# Patient Record
Sex: Female | Born: 1940 | Race: White | Hispanic: No | Smoking: Current some day smoker
Health system: Southern US, Community
[De-identification: ages and names within clinical notes are randomized; demographics above are authoritative.]

## PROBLEM LIST (undated history)

## (undated) DIAGNOSIS — E039 Hypothyroidism, unspecified: Secondary | ICD-10-CM

## (undated) DIAGNOSIS — K219 Gastro-esophageal reflux disease without esophagitis: Secondary | ICD-10-CM

## (undated) DIAGNOSIS — K589 Irritable bowel syndrome without diarrhea: Secondary | ICD-10-CM

## (undated) DIAGNOSIS — I428 Other cardiomyopathies: Secondary | ICD-10-CM

## (undated) DIAGNOSIS — Z95810 Presence of automatic (implantable) cardiac defibrillator: Secondary | ICD-10-CM

## (undated) DIAGNOSIS — I509 Heart failure, unspecified: Secondary | ICD-10-CM

## (undated) DIAGNOSIS — I447 Left bundle-branch block, unspecified: Secondary | ICD-10-CM

## (undated) DIAGNOSIS — E119 Type 2 diabetes mellitus without complications: Secondary | ICD-10-CM

## (undated) DIAGNOSIS — IMO0002 Reserved for concepts with insufficient information to code with codable children: Secondary | ICD-10-CM

## (undated) DIAGNOSIS — G8929 Other chronic pain: Secondary | ICD-10-CM

## (undated) DIAGNOSIS — M1990 Unspecified osteoarthritis, unspecified site: Secondary | ICD-10-CM

## (undated) DIAGNOSIS — Z860100 Personal history of colon polyps, unspecified: Secondary | ICD-10-CM

## (undated) DIAGNOSIS — F329 Major depressive disorder, single episode, unspecified: Secondary | ICD-10-CM

## (undated) DIAGNOSIS — E785 Hyperlipidemia, unspecified: Secondary | ICD-10-CM

## (undated) DIAGNOSIS — I519 Heart disease, unspecified: Secondary | ICD-10-CM

## (undated) DIAGNOSIS — F419 Anxiety disorder, unspecified: Secondary | ICD-10-CM

## (undated) DIAGNOSIS — M545 Low back pain: Secondary | ICD-10-CM

## (undated) DIAGNOSIS — N390 Urinary tract infection, site not specified: Secondary | ICD-10-CM

## (undated) DIAGNOSIS — Z9889 Other specified postprocedural states: Secondary | ICD-10-CM

## (undated) DIAGNOSIS — M549 Dorsalgia, unspecified: Secondary | ICD-10-CM

## (undated) DIAGNOSIS — E032 Hypothyroidism due to medicaments and other exogenous substances: Secondary | ICD-10-CM

## (undated) DIAGNOSIS — G4700 Insomnia, unspecified: Secondary | ICD-10-CM

## (undated) DIAGNOSIS — E041 Nontoxic single thyroid nodule: Secondary | ICD-10-CM

## (undated) DIAGNOSIS — Z87442 Personal history of urinary calculi: Secondary | ICD-10-CM

## (undated) DIAGNOSIS — F17200 Nicotine dependence, unspecified, uncomplicated: Secondary | ICD-10-CM

## (undated) DIAGNOSIS — C3491 Malignant neoplasm of unspecified part of right bronchus or lung: Secondary | ICD-10-CM

## (undated) DIAGNOSIS — F32A Depression, unspecified: Secondary | ICD-10-CM

## (undated) DIAGNOSIS — N200 Calculus of kidney: Secondary | ICD-10-CM

## (undated) DIAGNOSIS — I10 Essential (primary) hypertension: Secondary | ICD-10-CM

## (undated) DIAGNOSIS — R112 Nausea with vomiting, unspecified: Secondary | ICD-10-CM

## (undated) HISTORY — DX: Left bundle-branch block, unspecified: I44.7

## (undated) HISTORY — DX: Dorsalgia, unspecified: M54.9

## (undated) HISTORY — DX: Essential (primary) hypertension: I10

## (undated) HISTORY — DX: Depression, unspecified: F32.A

## (undated) HISTORY — DX: Other cardiomyopathies: I42.8

## (undated) HISTORY — DX: Insomnia, unspecified: G47.00

## (undated) HISTORY — DX: Gastro-esophageal reflux disease without esophagitis: K21.9

## (undated) HISTORY — DX: Hypothyroidism due to medicaments and other exogenous substances: E03.2

## (undated) HISTORY — DX: Other chronic pain: G89.29

## (undated) HISTORY — DX: Personal history of colonic polyps: Z86.010

## (undated) HISTORY — DX: Reserved for concepts with insufficient information to code with codable children: IMO0002

## (undated) HISTORY — PX: OTHER SURGICAL HISTORY: SHX169

## (undated) HISTORY — DX: Heart disease, unspecified: I51.9

## (undated) HISTORY — DX: Nontoxic single thyroid nodule: E04.1

## (undated) HISTORY — DX: Heart failure, unspecified: I50.9

## (undated) HISTORY — DX: Hyperlipidemia, unspecified: E78.5

## (undated) HISTORY — DX: Urinary tract infection, site not specified: N39.0

## (undated) HISTORY — DX: Irritable bowel syndrome without diarrhea: K58.9

## (undated) HISTORY — DX: Anxiety disorder, unspecified: F41.9

## (undated) HISTORY — DX: Calculus of kidney: N20.0

## (undated) HISTORY — DX: Personal history of colon polyps, unspecified: Z86.0100

## (undated) HISTORY — DX: Low back pain: M54.5

## (undated) HISTORY — PX: BLADDER SURGERY: SHX569

## (undated) HISTORY — DX: Irritable bowel syndrome, unspecified: K58.9

## (undated) HISTORY — PX: FOOT SURGERY: SHX648

## (undated) HISTORY — DX: Personal history of urinary calculi: Z87.442

## (undated) HISTORY — PX: PACEMAKER PLACEMENT: SHX43

## (undated) HISTORY — PX: COLONOSCOPY: SHX174

## (undated) HISTORY — DX: Nicotine dependence, unspecified, uncomplicated: F17.200

## (undated) HISTORY — PX: THYROIDECTOMY, PARTIAL: SHX18

## (undated) HISTORY — DX: Type 2 diabetes mellitus without complications: E11.9

## (undated) HISTORY — PX: TUBAL LIGATION: SHX77

## (undated) HISTORY — DX: Major depressive disorder, single episode, unspecified: F32.9

## (undated) HISTORY — PX: OOPHORECTOMY: SHX86

## (undated) HISTORY — DX: Hypothyroidism, unspecified: E03.9

---
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
## 1971-03-10 HISTORY — PX: NEPHRECTOMY: SHX65

## 1984-03-09 HISTORY — PX: ABDOMINAL HYSTERECTOMY: SHX81

## 1997-03-09 ENCOUNTER — Encounter: Payer: Self-pay | Admitting: Internal Medicine

## 1997-12-03 ENCOUNTER — Other Ambulatory Visit: Admission: RE | Admit: 1997-12-03 | Discharge: 1997-12-03 | Payer: Self-pay | Admitting: Obstetrics & Gynecology

## 1999-12-08 ENCOUNTER — Encounter: Payer: Self-pay | Admitting: Emergency Medicine

## 1999-12-08 ENCOUNTER — Emergency Department (HOSPITAL_COMMUNITY): Admission: EM | Admit: 1999-12-08 | Discharge: 1999-12-08 | Payer: Self-pay | Admitting: Emergency Medicine

## 2000-07-20 ENCOUNTER — Encounter: Payer: Self-pay | Admitting: Emergency Medicine

## 2000-07-20 ENCOUNTER — Emergency Department (HOSPITAL_COMMUNITY): Admission: EM | Admit: 2000-07-20 | Discharge: 2000-07-21 | Payer: Self-pay | Admitting: Emergency Medicine

## 2000-11-01 ENCOUNTER — Emergency Department (HOSPITAL_COMMUNITY): Admission: EM | Admit: 2000-11-01 | Discharge: 2000-11-01 | Payer: Self-pay

## 2000-11-01 ENCOUNTER — Encounter: Payer: Self-pay | Admitting: Emergency Medicine

## 2001-01-01 ENCOUNTER — Ambulatory Visit (HOSPITAL_COMMUNITY): Admission: RE | Admit: 2001-01-01 | Discharge: 2001-01-01 | Payer: Self-pay | Admitting: *Deleted

## 2001-01-01 ENCOUNTER — Encounter: Payer: Self-pay | Admitting: *Deleted

## 2001-01-10 ENCOUNTER — Ambulatory Visit (HOSPITAL_COMMUNITY): Admission: RE | Admit: 2001-01-10 | Discharge: 2001-01-10 | Payer: Self-pay | Admitting: *Deleted

## 2001-01-10 ENCOUNTER — Encounter: Payer: Self-pay | Admitting: *Deleted

## 2001-07-05 ENCOUNTER — Ambulatory Visit (HOSPITAL_COMMUNITY): Admission: RE | Admit: 2001-07-05 | Discharge: 2001-07-05 | Payer: Self-pay | Admitting: *Deleted

## 2001-07-05 ENCOUNTER — Encounter: Payer: Self-pay | Admitting: *Deleted

## 2001-12-05 ENCOUNTER — Emergency Department (HOSPITAL_COMMUNITY): Admission: EM | Admit: 2001-12-05 | Discharge: 2001-12-05 | Payer: Self-pay | Admitting: Emergency Medicine

## 2001-12-15 ENCOUNTER — Encounter: Admission: RE | Admit: 2001-12-15 | Discharge: 2002-03-15 | Payer: Self-pay | Admitting: *Deleted

## 2002-05-02 ENCOUNTER — Encounter: Admission: RE | Admit: 2002-05-02 | Discharge: 2002-05-02 | Payer: Self-pay | Admitting: General Practice

## 2002-05-02 ENCOUNTER — Encounter: Payer: Self-pay | Admitting: General Practice

## 2002-05-31 ENCOUNTER — Encounter: Payer: Self-pay | Admitting: Emergency Medicine

## 2002-05-31 ENCOUNTER — Emergency Department (HOSPITAL_COMMUNITY): Admission: EM | Admit: 2002-05-31 | Discharge: 2002-05-31 | Payer: Self-pay | Admitting: *Deleted

## 2002-06-02 ENCOUNTER — Emergency Department (HOSPITAL_COMMUNITY): Admission: EM | Admit: 2002-06-02 | Discharge: 2002-06-02 | Payer: Self-pay

## 2002-06-25 ENCOUNTER — Emergency Department (HOSPITAL_COMMUNITY): Admission: EM | Admit: 2002-06-25 | Discharge: 2002-06-26 | Payer: Self-pay | Admitting: Emergency Medicine

## 2002-06-26 ENCOUNTER — Encounter: Payer: Self-pay | Admitting: Emergency Medicine

## 2002-06-26 ENCOUNTER — Ambulatory Visit (HOSPITAL_COMMUNITY): Admission: RE | Admit: 2002-06-26 | Discharge: 2002-06-26 | Payer: Self-pay | Admitting: Emergency Medicine

## 2002-06-26 ENCOUNTER — Emergency Department (HOSPITAL_COMMUNITY): Admission: EM | Admit: 2002-06-26 | Discharge: 2002-06-26 | Payer: Self-pay | Admitting: Emergency Medicine

## 2002-07-03 ENCOUNTER — Encounter: Payer: Self-pay | Admitting: Gastroenterology

## 2002-07-03 ENCOUNTER — Inpatient Hospital Stay (HOSPITAL_COMMUNITY): Admission: EM | Admit: 2002-07-03 | Discharge: 2002-07-06 | Payer: Self-pay | Admitting: Emergency Medicine

## 2002-07-04 ENCOUNTER — Encounter: Payer: Self-pay | Admitting: Gastroenterology

## 2002-07-05 ENCOUNTER — Encounter: Payer: Self-pay | Admitting: Gastroenterology

## 2003-03-08 ENCOUNTER — Emergency Department (HOSPITAL_COMMUNITY): Admission: AD | Admit: 2003-03-08 | Discharge: 2003-03-08 | Payer: Self-pay | Admitting: Family Medicine

## 2003-04-26 ENCOUNTER — Encounter
Admission: RE | Admit: 2003-04-26 | Discharge: 2003-07-25 | Payer: Self-pay | Admitting: Physical Medicine & Rehabilitation

## 2003-06-04 ENCOUNTER — Emergency Department (HOSPITAL_COMMUNITY): Admission: EM | Admit: 2003-06-04 | Discharge: 2003-06-04 | Payer: Self-pay | Admitting: Emergency Medicine

## 2003-08-30 ENCOUNTER — Encounter
Admission: RE | Admit: 2003-08-30 | Discharge: 2003-11-15 | Payer: Self-pay | Admitting: Physical Medicine & Rehabilitation

## 2003-11-15 ENCOUNTER — Encounter
Admission: RE | Admit: 2003-11-15 | Discharge: 2004-02-13 | Payer: Self-pay | Admitting: Physical Medicine & Rehabilitation

## 2003-11-19 ENCOUNTER — Ambulatory Visit: Payer: Self-pay | Admitting: Physical Medicine & Rehabilitation

## 2004-01-08 ENCOUNTER — Encounter: Admission: RE | Admit: 2004-01-08 | Discharge: 2004-01-08 | Payer: Self-pay | Admitting: Gastroenterology

## 2004-01-24 ENCOUNTER — Ambulatory Visit (HOSPITAL_COMMUNITY): Admission: RE | Admit: 2004-01-24 | Discharge: 2004-01-24 | Payer: Self-pay | Admitting: Gastroenterology

## 2004-02-22 ENCOUNTER — Encounter
Admission: RE | Admit: 2004-02-22 | Discharge: 2004-05-22 | Payer: Self-pay | Admitting: Physical Medicine & Rehabilitation

## 2004-03-11 ENCOUNTER — Ambulatory Visit (HOSPITAL_COMMUNITY): Admission: RE | Admit: 2004-03-11 | Discharge: 2004-03-11 | Payer: Self-pay | Admitting: Gastroenterology

## 2004-05-22 ENCOUNTER — Encounter
Admission: RE | Admit: 2004-05-22 | Discharge: 2004-08-20 | Payer: Self-pay | Admitting: Physical Medicine & Rehabilitation

## 2004-05-26 ENCOUNTER — Ambulatory Visit: Payer: Self-pay | Admitting: Physical Medicine & Rehabilitation

## 2004-07-21 ENCOUNTER — Ambulatory Visit: Payer: Self-pay | Admitting: Physical Medicine & Rehabilitation

## 2004-09-02 ENCOUNTER — Encounter
Admission: RE | Admit: 2004-09-02 | Discharge: 2004-12-01 | Payer: Self-pay | Admitting: Physical Medicine & Rehabilitation

## 2004-09-02 ENCOUNTER — Ambulatory Visit: Payer: Self-pay | Admitting: Physical Medicine & Rehabilitation

## 2004-09-08 ENCOUNTER — Ambulatory Visit (HOSPITAL_COMMUNITY)
Admission: RE | Admit: 2004-09-08 | Discharge: 2004-09-08 | Payer: Self-pay | Admitting: Physical Medicine & Rehabilitation

## 2004-11-15 ENCOUNTER — Encounter (INDEPENDENT_AMBULATORY_CARE_PROVIDER_SITE_OTHER): Payer: Self-pay | Admitting: Specialist

## 2004-11-15 ENCOUNTER — Ambulatory Visit (HOSPITAL_COMMUNITY): Admission: RE | Admit: 2004-11-15 | Discharge: 2004-11-15 | Payer: Self-pay | Admitting: Urology

## 2004-11-24 ENCOUNTER — Ambulatory Visit: Payer: Self-pay | Admitting: Gastroenterology

## 2004-12-03 ENCOUNTER — Ambulatory Visit: Payer: Self-pay | Admitting: Physical Medicine & Rehabilitation

## 2004-12-03 ENCOUNTER — Encounter
Admission: RE | Admit: 2004-12-03 | Discharge: 2005-03-03 | Payer: Self-pay | Admitting: Physical Medicine & Rehabilitation

## 2004-12-17 ENCOUNTER — Encounter: Admission: RE | Admit: 2004-12-17 | Discharge: 2004-12-17 | Payer: Self-pay | Admitting: *Deleted

## 2004-12-24 ENCOUNTER — Ambulatory Visit (HOSPITAL_COMMUNITY): Admission: RE | Admit: 2004-12-24 | Discharge: 2004-12-24 | Payer: Self-pay | Admitting: *Deleted

## 2004-12-24 ENCOUNTER — Encounter (INDEPENDENT_AMBULATORY_CARE_PROVIDER_SITE_OTHER): Payer: Self-pay | Admitting: *Deleted

## 2004-12-25 ENCOUNTER — Ambulatory Visit (HOSPITAL_COMMUNITY): Admission: RE | Admit: 2004-12-25 | Discharge: 2004-12-25 | Payer: Self-pay | Admitting: *Deleted

## 2005-02-20 ENCOUNTER — Ambulatory Visit: Payer: Self-pay | Admitting: Physical Medicine & Rehabilitation

## 2005-03-03 ENCOUNTER — Ambulatory Visit (HOSPITAL_COMMUNITY): Admission: RE | Admit: 2005-03-03 | Discharge: 2005-03-03 | Payer: Self-pay | Admitting: Urology

## 2005-05-21 ENCOUNTER — Ambulatory Visit: Payer: Self-pay | Admitting: Physical Medicine & Rehabilitation

## 2005-05-21 ENCOUNTER — Encounter
Admission: RE | Admit: 2005-05-21 | Discharge: 2005-08-19 | Payer: Self-pay | Admitting: Physical Medicine & Rehabilitation

## 2005-08-09 ENCOUNTER — Emergency Department (HOSPITAL_COMMUNITY): Admission: EM | Admit: 2005-08-09 | Discharge: 2005-08-09 | Payer: Self-pay | Admitting: Emergency Medicine

## 2005-08-10 ENCOUNTER — Ambulatory Visit (HOSPITAL_COMMUNITY): Admission: RE | Admit: 2005-08-10 | Discharge: 2005-08-10 | Payer: Self-pay | Admitting: *Deleted

## 2005-08-10 ENCOUNTER — Encounter (INDEPENDENT_AMBULATORY_CARE_PROVIDER_SITE_OTHER): Payer: Self-pay | Admitting: Specialist

## 2005-08-24 ENCOUNTER — Ambulatory Visit: Payer: Self-pay | Admitting: Physical Medicine & Rehabilitation

## 2005-08-24 ENCOUNTER — Encounter
Admission: RE | Admit: 2005-08-24 | Discharge: 2005-11-22 | Payer: Self-pay | Admitting: Physical Medicine & Rehabilitation

## 2005-09-25 ENCOUNTER — Ambulatory Visit: Payer: Self-pay | Admitting: Physical Medicine & Rehabilitation

## 2005-11-14 ENCOUNTER — Emergency Department (HOSPITAL_COMMUNITY): Admission: EM | Admit: 2005-11-14 | Discharge: 2005-11-14 | Payer: Self-pay | Admitting: Emergency Medicine

## 2005-11-18 ENCOUNTER — Ambulatory Visit: Payer: Self-pay | Admitting: Internal Medicine

## 2005-12-17 ENCOUNTER — Encounter
Admission: RE | Admit: 2005-12-17 | Discharge: 2006-03-17 | Payer: Self-pay | Admitting: Physical Medicine & Rehabilitation

## 2005-12-17 ENCOUNTER — Ambulatory Visit: Payer: Self-pay | Admitting: Physical Medicine & Rehabilitation

## 2005-12-22 ENCOUNTER — Ambulatory Visit: Payer: Self-pay | Admitting: Internal Medicine

## 2005-12-22 LAB — CONVERTED CEMR LAB
Alkaline Phosphatase: 37 units/L — ABNORMAL LOW (ref 39–117)
BUN: 4 mg/dL — ABNORMAL LOW (ref 6–23)
Basophils Relative: 0.3 % (ref 0.0–1.0)
Bilirubin Urine: NEGATIVE
CO2: 27 meq/L (ref 19–32)
Calcium: 9.2 mg/dL (ref 8.4–10.5)
Chloride: 104 meq/L (ref 96–112)
Chol/HDL Ratio, serum: 5.9
Cholesterol: 282 mg/dL (ref 0–200)
HDL: 47.4 mg/dL (ref >39.0)
Hemoglobin, Urine: NEGATIVE
Hemoglobin: 14.5 g/dL (ref 12.0–15.0)
Ketones, ur: NEGATIVE mg/dL
Lymphocytes Relative: 44.8 % (ref 12.0–46.0)
MCV: 94.4 fL (ref 78.0–100.0)
Monocytes Absolute: 0.6 10*3/uL (ref 0.2–0.7)
Monocytes Relative: 6.8 % (ref 3.0–11.0)
Neutrophils Relative %: 47.3 % (ref 43.0–77.0)
Potassium: 4.1 meq/L (ref 3.5–5.1)
TSH: 1.23 microintl units/mL (ref 0.35–5.50)
Total Protein: 6.7 g/dL (ref 6.0–8.3)
Triglyceride fasting, serum: 203 mg/dL (ref 0–149)
Urobilinogen, UA: 0.2 (ref 0.0–1.0)
VLDL: 41 mg/dL — ABNORMAL HIGH (ref 0–40)
WBC: 9.3 10*3/uL (ref 4.5–10.5)
pH: 7 (ref 5.0–8.0)

## 2005-12-29 ENCOUNTER — Ambulatory Visit (HOSPITAL_COMMUNITY): Admission: RE | Admit: 2005-12-29 | Discharge: 2005-12-29 | Payer: Self-pay | Admitting: Internal Medicine

## 2006-01-11 ENCOUNTER — Encounter (INDEPENDENT_AMBULATORY_CARE_PROVIDER_SITE_OTHER): Payer: Self-pay | Admitting: Specialist

## 2006-01-11 ENCOUNTER — Other Ambulatory Visit: Admission: RE | Admit: 2006-01-11 | Discharge: 2006-01-11 | Payer: Self-pay | Admitting: Interventional Radiology

## 2006-01-11 ENCOUNTER — Encounter: Admission: RE | Admit: 2006-01-11 | Discharge: 2006-01-11 | Payer: Self-pay | Admitting: Internal Medicine

## 2006-01-26 ENCOUNTER — Ambulatory Visit: Payer: Self-pay | Admitting: Internal Medicine

## 2006-02-11 ENCOUNTER — Ambulatory Visit: Payer: Self-pay | Admitting: Endocrinology

## 2006-03-15 ENCOUNTER — Encounter: Admission: RE | Admit: 2006-03-15 | Discharge: 2006-03-15 | Payer: Self-pay | Admitting: Orthopedic Surgery

## 2006-03-22 ENCOUNTER — Encounter
Admission: RE | Admit: 2006-03-22 | Discharge: 2006-06-20 | Payer: Self-pay | Admitting: Physical Medicine & Rehabilitation

## 2006-03-22 ENCOUNTER — Ambulatory Visit: Payer: Self-pay | Admitting: Physical Medicine & Rehabilitation

## 2006-04-26 ENCOUNTER — Encounter: Admission: RE | Admit: 2006-04-26 | Discharge: 2006-04-26 | Payer: Self-pay | Admitting: Orthopedic Surgery

## 2006-05-10 ENCOUNTER — Encounter: Admission: RE | Admit: 2006-05-10 | Discharge: 2006-05-10 | Payer: Self-pay | Admitting: Orthopedic Surgery

## 2006-06-16 ENCOUNTER — Encounter
Admission: RE | Admit: 2006-06-16 | Discharge: 2006-09-14 | Payer: Self-pay | Admitting: Physical Medicine & Rehabilitation

## 2006-06-16 ENCOUNTER — Ambulatory Visit: Payer: Self-pay | Admitting: Physical Medicine & Rehabilitation

## 2006-08-04 ENCOUNTER — Ambulatory Visit: Payer: Self-pay | Admitting: Internal Medicine

## 2006-08-23 ENCOUNTER — Encounter: Admission: RE | Admit: 2006-08-23 | Discharge: 2006-08-23 | Payer: Self-pay | Admitting: General Surgery

## 2006-08-31 ENCOUNTER — Encounter (INDEPENDENT_AMBULATORY_CARE_PROVIDER_SITE_OTHER): Payer: Self-pay | Admitting: General Surgery

## 2006-08-31 ENCOUNTER — Ambulatory Visit (HOSPITAL_COMMUNITY): Admission: RE | Admit: 2006-08-31 | Discharge: 2006-09-01 | Payer: Self-pay | Admitting: General Surgery

## 2006-10-06 ENCOUNTER — Ambulatory Visit: Payer: Self-pay | Admitting: Physical Medicine & Rehabilitation

## 2006-10-06 ENCOUNTER — Encounter
Admission: RE | Admit: 2006-10-06 | Discharge: 2006-12-07 | Payer: Self-pay | Admitting: Physical Medicine & Rehabilitation

## 2006-10-12 ENCOUNTER — Ambulatory Visit: Payer: Self-pay | Admitting: Internal Medicine

## 2006-10-12 LAB — CONVERTED CEMR LAB
ALT: 9 units/L (ref 0–35)
Cholesterol: 272 mg/dL (ref 0–200)
GFR calc Af Amer: 160 mL/min
GFR calc non Af Amer: 132 mL/min
HDL: 57.6 mg/dL (ref >39.0)
Potassium: 4.3 meq/L (ref 3.5–5.1)
Sodium: 138 meq/L (ref 135–145)
Total CHOL/HDL Ratio: 4.7
Triglycerides: 138 mg/dL (ref 0–149)
VLDL: 28 mg/dL (ref 0–40)
Vit D, 1,25-Dihydroxy: 8 — ABNORMAL LOW (ref 20–57)

## 2006-10-19 ENCOUNTER — Ambulatory Visit: Payer: Self-pay | Admitting: Internal Medicine

## 2006-10-24 ENCOUNTER — Encounter: Payer: Self-pay | Admitting: Internal Medicine

## 2006-10-24 DIAGNOSIS — Z860100 Personal history of colon polyps, unspecified: Secondary | ICD-10-CM | POA: Insufficient documentation

## 2006-10-24 DIAGNOSIS — I428 Other cardiomyopathies: Secondary | ICD-10-CM

## 2006-10-24 DIAGNOSIS — N390 Urinary tract infection, site not specified: Secondary | ICD-10-CM

## 2006-10-24 DIAGNOSIS — E785 Hyperlipidemia, unspecified: Secondary | ICD-10-CM | POA: Insufficient documentation

## 2006-10-24 DIAGNOSIS — E041 Nontoxic single thyroid nodule: Secondary | ICD-10-CM

## 2006-10-24 DIAGNOSIS — K589 Irritable bowel syndrome without diarrhea: Secondary | ICD-10-CM

## 2006-10-24 DIAGNOSIS — F411 Generalized anxiety disorder: Secondary | ICD-10-CM | POA: Insufficient documentation

## 2006-10-24 HISTORY — DX: Personal history of colonic polyps: Z86.010

## 2006-10-24 HISTORY — DX: Hyperlipidemia, unspecified: E78.5

## 2006-10-24 HISTORY — DX: Other cardiomyopathies: I42.8

## 2006-10-24 HISTORY — DX: Personal history of colon polyps, unspecified: Z86.0100

## 2006-10-24 HISTORY — DX: Urinary tract infection, site not specified: N39.0

## 2006-10-24 HISTORY — DX: Irritable bowel syndrome, unspecified: K58.9

## 2006-12-13 ENCOUNTER — Ambulatory Visit: Payer: Self-pay | Admitting: Internal Medicine

## 2006-12-13 LAB — CONVERTED CEMR LAB
Albumin: 3.4 g/dL — ABNORMAL LOW (ref 3.5–5.2)
Alkaline Phosphatase: 34 units/L — ABNORMAL LOW (ref 39–117)
BUN: 12 mg/dL (ref 6–23)
Basophils Absolute: 0.1 10*3/uL (ref 0.0–0.1)
Bilirubin Urine: NEGATIVE
Creatinine, Ser: 0.5 mg/dL (ref 0.4–1.2)
GFR calc Af Amer: 159 mL/min
GFR calc non Af Amer: 132 mL/min
HDL: 63.1 mg/dL (ref >39.0)
Hemoglobin, Urine: NEGATIVE
Hemoglobin: 12.9 g/dL (ref 12.0–15.0)
LDL Cholesterol: 101 mg/dL — ABNORMAL HIGH (ref 0–99)
Leukocytes, UA: NEGATIVE
MCHC: 34.9 g/dL (ref 30.0–36.0)
Monocytes Absolute: 0.8 10*3/uL — ABNORMAL HIGH (ref 0.2–0.7)
Monocytes Relative: 7.1 % (ref 3.0–11.0)
Neutro Abs: 5.8 10*3/uL (ref 1.4–7.7)
Potassium: 3.9 meq/L (ref 3.5–5.1)
RDW: 12.5 % (ref 11.5–14.6)
Total CHOL/HDL Ratio: 2.9
Triglycerides: 93 mg/dL (ref 0–149)
VLDL: 19 mg/dL (ref 0–40)
pH: 6 (ref 5.0–8.0)

## 2006-12-20 ENCOUNTER — Ambulatory Visit: Payer: Self-pay | Admitting: Internal Medicine

## 2006-12-20 ENCOUNTER — Encounter: Payer: Self-pay | Admitting: Internal Medicine

## 2006-12-20 DIAGNOSIS — F329 Major depressive disorder, single episode, unspecified: Secondary | ICD-10-CM

## 2006-12-20 DIAGNOSIS — M545 Low back pain, unspecified: Secondary | ICD-10-CM

## 2006-12-20 DIAGNOSIS — K219 Gastro-esophageal reflux disease without esophagitis: Secondary | ICD-10-CM

## 2006-12-20 DIAGNOSIS — F3289 Other specified depressive episodes: Secondary | ICD-10-CM

## 2006-12-20 DIAGNOSIS — Z87442 Personal history of urinary calculi: Secondary | ICD-10-CM

## 2006-12-20 HISTORY — DX: Gastro-esophageal reflux disease without esophagitis: K21.9

## 2006-12-20 HISTORY — DX: Low back pain, unspecified: M54.50

## 2006-12-20 HISTORY — DX: Other specified depressive episodes: F32.89

## 2006-12-20 HISTORY — DX: Personal history of urinary calculi: Z87.442

## 2006-12-20 HISTORY — DX: Major depressive disorder, single episode, unspecified: F32.9

## 2006-12-20 LAB — CONVERTED CEMR LAB
Leukocytes, UA: NEGATIVE
Nitrite: NEGATIVE
Specific Gravity, Urine: 1.01 (ref 1.000–1.03)
Total Protein, Urine: NEGATIVE mg/dL
Urobilinogen, UA: 0.2 (ref 0.0–1.0)

## 2007-01-10 ENCOUNTER — Encounter: Payer: Self-pay | Admitting: Internal Medicine

## 2007-01-10 ENCOUNTER — Ambulatory Visit: Payer: Self-pay | Admitting: Family Medicine

## 2007-01-11 ENCOUNTER — Telehealth (INDEPENDENT_AMBULATORY_CARE_PROVIDER_SITE_OTHER): Payer: Self-pay | Admitting: *Deleted

## 2007-01-14 ENCOUNTER — Encounter
Admission: RE | Admit: 2007-01-14 | Discharge: 2007-01-17 | Payer: Self-pay | Admitting: Physical Medicine & Rehabilitation

## 2007-01-14 ENCOUNTER — Ambulatory Visit: Payer: Self-pay | Admitting: Physical Medicine & Rehabilitation

## 2007-01-18 ENCOUNTER — Telehealth (INDEPENDENT_AMBULATORY_CARE_PROVIDER_SITE_OTHER): Payer: Self-pay | Admitting: *Deleted

## 2007-04-12 ENCOUNTER — Emergency Department (HOSPITAL_COMMUNITY): Admission: EM | Admit: 2007-04-12 | Discharge: 2007-04-12 | Payer: Self-pay | Admitting: Emergency Medicine

## 2007-05-13 ENCOUNTER — Encounter
Admission: RE | Admit: 2007-05-13 | Discharge: 2007-05-16 | Payer: Self-pay | Admitting: Physical Medicine & Rehabilitation

## 2007-05-13 ENCOUNTER — Ambulatory Visit: Payer: Self-pay | Admitting: Physical Medicine & Rehabilitation

## 2007-06-13 ENCOUNTER — Ambulatory Visit: Payer: Self-pay | Admitting: Internal Medicine

## 2007-06-13 DIAGNOSIS — F17200 Nicotine dependence, unspecified, uncomplicated: Secondary | ICD-10-CM

## 2007-06-15 ENCOUNTER — Encounter: Payer: Self-pay | Admitting: Internal Medicine

## 2007-07-12 ENCOUNTER — Ambulatory Visit: Payer: Self-pay | Admitting: Internal Medicine

## 2007-07-12 LAB — CONVERTED CEMR LAB
ALT: 18 units/L (ref 0–35)
AST: 16 units/L (ref 0–37)
Bilirubin, Direct: 0.1 mg/dL (ref 0.0–0.3)
Cholesterol: 170 mg/dL (ref 0–200)
HDL: 47.8 mg/dL (ref >39.0)
Total Protein: 6.2 g/dL (ref 6.0–8.3)
VLDL: 34 mg/dL (ref 0–40)

## 2007-08-12 ENCOUNTER — Encounter
Admission: RE | Admit: 2007-08-12 | Discharge: 2007-08-15 | Payer: Self-pay | Admitting: Physical Medicine & Rehabilitation

## 2007-08-15 ENCOUNTER — Ambulatory Visit: Payer: Self-pay | Admitting: Physical Medicine & Rehabilitation

## 2007-08-19 ENCOUNTER — Telehealth (INDEPENDENT_AMBULATORY_CARE_PROVIDER_SITE_OTHER): Payer: Self-pay | Admitting: *Deleted

## 2007-08-22 ENCOUNTER — Encounter: Payer: Self-pay | Admitting: Internal Medicine

## 2007-08-30 ENCOUNTER — Ambulatory Visit: Payer: Self-pay | Admitting: Internal Medicine

## 2007-08-30 DIAGNOSIS — R5381 Other malaise: Secondary | ICD-10-CM

## 2007-08-30 DIAGNOSIS — L989 Disorder of the skin and subcutaneous tissue, unspecified: Secondary | ICD-10-CM | POA: Insufficient documentation

## 2007-08-30 DIAGNOSIS — R5383 Other fatigue: Secondary | ICD-10-CM

## 2007-09-05 ENCOUNTER — Telehealth (INDEPENDENT_AMBULATORY_CARE_PROVIDER_SITE_OTHER): Payer: Self-pay | Admitting: *Deleted

## 2007-09-12 ENCOUNTER — Encounter (HOSPITAL_BASED_OUTPATIENT_CLINIC_OR_DEPARTMENT_OTHER): Admission: RE | Admit: 2007-09-12 | Discharge: 2007-09-22 | Payer: Self-pay | Admitting: Internal Medicine

## 2007-09-13 ENCOUNTER — Ambulatory Visit: Payer: Self-pay | Admitting: Pulmonary Disease

## 2007-09-13 LAB — CONVERTED CEMR LAB
ALT: 12 units/L (ref 0–35)
AST: 12 units/L (ref 0–37)
Alkaline Phosphatase: 41 units/L (ref 39–117)
Basophils Absolute: 0.1 10*3/uL (ref 0.0–0.1)
Basophils Relative: 0.8 % (ref 0.0–1.0)
Bilirubin, Direct: 0.1 mg/dL (ref 0.0–0.3)
CO2: 26 meq/L (ref 19–32)
Chloride: 101 meq/L (ref 96–112)
Creatinine, Ser: 0.5 mg/dL (ref 0.4–1.2)
GFR calc non Af Amer: 132 mL/min
Lymphocytes Relative: 28.5 % (ref 12.0–46.0)
MCHC: 34.7 g/dL (ref 30.0–36.0)
Neutrophils Relative %: 64.4 % (ref 43.0–77.0)
Platelets: 295 10*3/uL (ref 150–400)
Potassium: 3.5 meq/L (ref 3.5–5.1)
RBC: 4.17 M/uL (ref 3.87–5.11)
TSH: 0.58 microintl units/mL (ref 0.35–5.50)
Total Bilirubin: 0.6 mg/dL (ref 0.3–1.2)
WBC: 12.7 10*3/uL — ABNORMAL HIGH (ref 4.5–10.5)

## 2007-09-19 ENCOUNTER — Telehealth (INDEPENDENT_AMBULATORY_CARE_PROVIDER_SITE_OTHER): Payer: Self-pay | Admitting: *Deleted

## 2007-10-03 ENCOUNTER — Encounter: Payer: Self-pay | Admitting: Pulmonary Disease

## 2007-10-03 ENCOUNTER — Ambulatory Visit (HOSPITAL_BASED_OUTPATIENT_CLINIC_OR_DEPARTMENT_OTHER): Admission: RE | Admit: 2007-10-03 | Discharge: 2007-10-03 | Payer: Self-pay | Admitting: Pulmonary Disease

## 2007-10-18 ENCOUNTER — Encounter: Payer: Self-pay | Admitting: Internal Medicine

## 2007-10-18 ENCOUNTER — Ambulatory Visit: Payer: Self-pay | Admitting: Pulmonary Disease

## 2007-10-19 ENCOUNTER — Telehealth (INDEPENDENT_AMBULATORY_CARE_PROVIDER_SITE_OTHER): Payer: Self-pay | Admitting: *Deleted

## 2007-10-26 ENCOUNTER — Encounter (INDEPENDENT_AMBULATORY_CARE_PROVIDER_SITE_OTHER): Payer: Self-pay | Admitting: *Deleted

## 2007-11-07 ENCOUNTER — Encounter
Admission: RE | Admit: 2007-11-07 | Discharge: 2007-11-07 | Payer: Self-pay | Admitting: Physical Medicine & Rehabilitation

## 2007-11-07 ENCOUNTER — Ambulatory Visit: Payer: Self-pay | Admitting: Physical Medicine & Rehabilitation

## 2007-12-05 ENCOUNTER — Ambulatory Visit: Payer: Self-pay | Admitting: Pulmonary Disease

## 2007-12-06 ENCOUNTER — Encounter: Admission: RE | Admit: 2007-12-06 | Discharge: 2008-03-05 | Payer: Self-pay | Admitting: Family Medicine

## 2007-12-12 ENCOUNTER — Ambulatory Visit: Payer: Self-pay | Admitting: Internal Medicine

## 2007-12-12 DIAGNOSIS — I447 Left bundle-branch block, unspecified: Secondary | ICD-10-CM

## 2007-12-12 HISTORY — DX: Left bundle-branch block, unspecified: I44.7

## 2007-12-13 ENCOUNTER — Encounter: Payer: Self-pay | Admitting: Internal Medicine

## 2007-12-13 DIAGNOSIS — E119 Type 2 diabetes mellitus without complications: Secondary | ICD-10-CM | POA: Insufficient documentation

## 2007-12-13 DIAGNOSIS — E1165 Type 2 diabetes mellitus with hyperglycemia: Secondary | ICD-10-CM

## 2007-12-13 HISTORY — DX: Type 2 diabetes mellitus without complications: E11.9

## 2007-12-14 LAB — CONVERTED CEMR LAB
ALT: 15 U/L (ref 0–35)
AST: 18 U/L (ref 0–37)
Albumin: 3.5 g/dL (ref 3.5–5.2)
Alkaline Phosphatase: 38 U/L — ABNORMAL LOW (ref 39–117)
BUN: 5 mg/dL — ABNORMAL LOW (ref 6–23)
Basophils Absolute: 0.1 K/uL (ref 0.0–0.1)
Basophils Relative: 0.8 % (ref 0.0–3.0)
Bilirubin Urine: NEGATIVE
Bilirubin, Direct: 0.1 mg/dL (ref 0.0–0.3)
CO2: 28 meq/L (ref 19–32)
Calcium: 8.9 mg/dL (ref 8.4–10.5)
Chloride: 105 meq/L (ref 96–112)
Cholesterol: 165 mg/dL (ref 0–200)
Creatinine, Ser: 0.6 mg/dL (ref 0.4–1.2)
Eosinophils Absolute: 0.1 K/uL (ref 0.0–0.7)
Eosinophils Relative: 0.9 % (ref 0.0–5.0)
GFR calc Af Amer: 129 mL/min
GFR calc non Af Amer: 106 mL/min
Glucose, Bld: 120 mg/dL — ABNORMAL HIGH (ref 70–99)
HCT: 41.8 % (ref 36.0–46.0)
HDL: 54 mg/dL (ref >39.0)
Hemoglobin, Urine: NEGATIVE
Hemoglobin: 14.4 g/dL (ref 12.0–15.0)
Hgb A1c MFr Bld: 7.1 % — ABNORMAL HIGH (ref 4.6–6.0)
Ketones, ur: NEGATIVE mg/dL
LDL Cholesterol: 85 mg/dL (ref 0–99)
Leukocytes, UA: NEGATIVE
Lymphocytes Relative: 34.9 % (ref 12.0–46.0)
MCHC: 34.5 g/dL (ref 30.0–36.0)
MCV: 94.1 fL (ref 78.0–100.0)
Monocytes Absolute: 0.9 K/uL (ref 0.1–1.0)
Monocytes Relative: 7 % (ref 3.0–12.0)
Neutro Abs: 7.3 K/uL (ref 1.4–7.7)
Neutrophils Relative %: 56.4 % (ref 43.0–77.0)
Nitrite: NEGATIVE
Platelets: 289 K/uL (ref 150–400)
Potassium: 4.3 meq/L (ref 3.5–5.1)
RBC: 4.44 M/uL (ref 3.87–5.11)
RDW: 13.3 % (ref 11.5–14.6)
Sodium: 142 meq/L (ref 135–145)
Specific Gravity, Urine: <=1.005 (ref 1.000–1.03)
TSH: 0.31 u[IU]/mL — ABNORMAL LOW (ref 0.35–5.50)
Total Bilirubin: 0.7 mg/dL (ref 0.3–1.2)
Total CHOL/HDL Ratio: 3.1
Total Protein, Urine: NEGATIVE mg/dL
Total Protein: 6.4 g/dL (ref 6.0–8.3)
Triglycerides: 130 mg/dL (ref 0–149)
Urine Glucose: NEGATIVE mg/dL
Urobilinogen, UA: 0.2 (ref 0.0–1.0)
VLDL: 26 mg/dL (ref 0–40)
WBC: 12.9 10*3/microliter — ABNORMAL HIGH (ref 4.5–10.5)
pH: 6 (ref 5.0–8.0)

## 2007-12-19 ENCOUNTER — Encounter: Payer: Self-pay | Admitting: Internal Medicine

## 2007-12-19 ENCOUNTER — Ambulatory Visit: Payer: Self-pay

## 2007-12-20 ENCOUNTER — Telehealth: Payer: Self-pay | Admitting: Internal Medicine

## 2007-12-20 ENCOUNTER — Encounter: Payer: Self-pay | Admitting: Internal Medicine

## 2007-12-20 DIAGNOSIS — I519 Heart disease, unspecified: Secondary | ICD-10-CM

## 2007-12-20 DIAGNOSIS — R9431 Abnormal electrocardiogram [ECG] [EKG]: Secondary | ICD-10-CM

## 2007-12-20 DIAGNOSIS — R9389 Abnormal findings on diagnostic imaging of other specified body structures: Secondary | ICD-10-CM

## 2007-12-20 HISTORY — DX: Heart disease, unspecified: I51.9

## 2007-12-23 ENCOUNTER — Telehealth (INDEPENDENT_AMBULATORY_CARE_PROVIDER_SITE_OTHER): Payer: Self-pay | Admitting: *Deleted

## 2007-12-27 ENCOUNTER — Telehealth (INDEPENDENT_AMBULATORY_CARE_PROVIDER_SITE_OTHER): Payer: Self-pay | Admitting: *Deleted

## 2007-12-30 ENCOUNTER — Telehealth (INDEPENDENT_AMBULATORY_CARE_PROVIDER_SITE_OTHER): Payer: Self-pay | Admitting: *Deleted

## 2008-01-03 ENCOUNTER — Ambulatory Visit: Payer: Self-pay | Admitting: Cardiovascular Disease

## 2008-01-03 LAB — CONVERTED CEMR LAB
Basophils Absolute: 0.1 10*3/uL (ref 0.0–0.1)
Chloride: 106 meq/L (ref 96–112)
Eosinophils Absolute: 0.1 10*3/uL (ref 0.0–0.7)
GFR calc non Af Amer: 131 mL/min
MCHC: 33.9 g/dL (ref 30.0–36.0)
MCV: 95.9 fL (ref 78.0–100.0)
Neutrophils Relative %: 60.1 % (ref 43.0–77.0)
Platelets: 306 10*3/uL (ref 150–400)
Potassium: 4 meq/L (ref 3.5–5.1)
RDW: 13.1 % (ref 11.5–14.6)
Sodium: 143 meq/L (ref 135–145)
WBC: 12.4 10*3/uL — ABNORMAL HIGH (ref 4.5–10.5)

## 2008-01-04 ENCOUNTER — Telehealth: Payer: Self-pay | Admitting: Internal Medicine

## 2008-01-10 ENCOUNTER — Ambulatory Visit: Payer: Self-pay | Admitting: Cardiovascular Disease

## 2008-01-10 ENCOUNTER — Inpatient Hospital Stay (HOSPITAL_BASED_OUTPATIENT_CLINIC_OR_DEPARTMENT_OTHER): Admission: RE | Admit: 2008-01-10 | Discharge: 2008-01-10 | Payer: Self-pay | Admitting: Cardiovascular Disease

## 2008-01-10 HISTORY — PX: CARDIAC CATHETERIZATION: SHX172

## 2008-01-25 ENCOUNTER — Ambulatory Visit: Payer: Self-pay | Admitting: Cardiovascular Disease

## 2008-01-25 LAB — CONVERTED CEMR LAB
CO2: 29 meq/L (ref 19–32)
Chloride: 105 meq/L (ref 96–112)
GFR calc non Af Amer: 131 mL/min
Sodium: 142 meq/L (ref 135–145)

## 2008-01-27 ENCOUNTER — Encounter
Admission: RE | Admit: 2008-01-27 | Discharge: 2008-02-13 | Payer: Self-pay | Admitting: Physical Medicine & Rehabilitation

## 2008-01-30 ENCOUNTER — Ambulatory Visit: Payer: Self-pay | Admitting: Physical Medicine & Rehabilitation

## 2008-01-31 ENCOUNTER — Ambulatory Visit: Payer: Self-pay | Admitting: Cardiovascular Disease

## 2008-01-31 LAB — CONVERTED CEMR LAB
CO2: 28 meq/L (ref 19–32)
GFR calc Af Amer: 159 mL/min
Glucose, Bld: 97 mg/dL (ref 70–99)
Potassium: 4 meq/L (ref 3.5–5.1)
Sodium: 141 meq/L (ref 135–145)

## 2008-03-20 ENCOUNTER — Encounter: Payer: Self-pay | Admitting: Internal Medicine

## 2008-03-20 ENCOUNTER — Ambulatory Visit: Payer: Self-pay | Admitting: Internal Medicine

## 2008-03-20 DIAGNOSIS — H6690 Otitis media, unspecified, unspecified ear: Secondary | ICD-10-CM | POA: Insufficient documentation

## 2008-03-20 DIAGNOSIS — I428 Other cardiomyopathies: Secondary | ICD-10-CM

## 2008-03-20 LAB — CONVERTED CEMR LAB
Calcium: 9 mg/dL (ref 8.4–10.5)
Cholesterol: 131 mg/dL (ref 0–200)
Creatinine, Ser: 0.5 mg/dL (ref 0.4–1.2)
Direct LDL: 57.5 mg/dL
GFR calc Af Amer: 159 mL/min
Glucose, Bld: 117 mg/dL — ABNORMAL HIGH (ref 70–99)
HDL: 43.5 mg/dL (ref >39.0)
Sodium: 140 meq/L (ref 135–145)
Total CHOL/HDL Ratio: 3
Triglycerides: 205 mg/dL (ref 0–149)

## 2008-04-20 ENCOUNTER — Encounter
Admission: RE | Admit: 2008-04-20 | Discharge: 2008-05-22 | Payer: Self-pay | Admitting: Physical Medicine & Rehabilitation

## 2008-04-23 ENCOUNTER — Ambulatory Visit: Payer: Self-pay | Admitting: Physical Medicine & Rehabilitation

## 2008-05-22 ENCOUNTER — Ambulatory Visit: Payer: Self-pay | Admitting: Physical Medicine & Rehabilitation

## 2008-07-02 ENCOUNTER — Telehealth (INDEPENDENT_AMBULATORY_CARE_PROVIDER_SITE_OTHER): Payer: Self-pay | Admitting: *Deleted

## 2008-07-04 DIAGNOSIS — E032 Hypothyroidism due to medicaments and other exogenous substances: Secondary | ICD-10-CM

## 2008-07-04 HISTORY — DX: Hypothyroidism due to medicaments and other exogenous substances: E03.2

## 2008-07-23 ENCOUNTER — Ambulatory Visit: Payer: Self-pay | Admitting: Cardiovascular Disease

## 2008-07-25 ENCOUNTER — Telehealth: Payer: Self-pay | Admitting: Cardiovascular Disease

## 2008-07-25 LAB — CONVERTED CEMR LAB
BUN: 14 mg/dL (ref 6–23)
CO2: 29 meq/L (ref 19–32)
Calcium: 9 mg/dL (ref 8.4–10.5)
Chloride: 111 meq/L (ref 96–112)
Creatinine, Ser: 0.7 mg/dL (ref 0.4–1.2)
Glucose, Bld: 80 mg/dL (ref 70–99)

## 2008-07-30 ENCOUNTER — Telehealth (INDEPENDENT_AMBULATORY_CARE_PROVIDER_SITE_OTHER): Payer: Self-pay | Admitting: *Deleted

## 2008-08-13 ENCOUNTER — Encounter
Admission: RE | Admit: 2008-08-13 | Discharge: 2008-08-14 | Payer: Self-pay | Admitting: Physical Medicine & Rehabilitation

## 2008-08-14 ENCOUNTER — Ambulatory Visit: Payer: Self-pay | Admitting: Physical Medicine & Rehabilitation

## 2008-08-14 ENCOUNTER — Ambulatory Visit: Payer: Self-pay | Admitting: Internal Medicine

## 2008-08-14 LAB — CONVERTED CEMR LAB
BUN: 10 mg/dL (ref 6–23)
CO2: 28 meq/L (ref 19–32)
Calcium: 9.1 mg/dL (ref 8.4–10.5)
Creatinine, Ser: 0.5 mg/dL (ref 0.4–1.2)
GFR calc non Af Amer: 130.9 mL/min (ref >60)
Glucose, Bld: 120 mg/dL — ABNORMAL HIGH (ref 70–99)
Hgb A1c MFr Bld: 6.5 % (ref 4.6–6.5)
Sodium: 141 meq/L (ref 135–145)
Total CHOL/HDL Ratio: 2

## 2008-08-28 ENCOUNTER — Ambulatory Visit: Payer: Self-pay | Admitting: Internal Medicine

## 2008-08-28 DIAGNOSIS — M79609 Pain in unspecified limb: Secondary | ICD-10-CM

## 2008-08-28 DIAGNOSIS — IMO0002 Reserved for concepts with insufficient information to code with codable children: Secondary | ICD-10-CM

## 2008-08-28 HISTORY — DX: Reserved for concepts with insufficient information to code with codable children: IMO0002

## 2008-08-29 ENCOUNTER — Telehealth: Payer: Self-pay | Admitting: Internal Medicine

## 2008-09-04 ENCOUNTER — Ambulatory Visit: Payer: Self-pay

## 2008-09-05 ENCOUNTER — Encounter: Payer: Self-pay | Admitting: Internal Medicine

## 2008-09-16 ENCOUNTER — Encounter: Admission: RE | Admit: 2008-09-16 | Discharge: 2008-09-16 | Payer: Self-pay | Admitting: Internal Medicine

## 2008-09-18 ENCOUNTER — Encounter: Payer: Self-pay | Admitting: Cardiovascular Disease

## 2008-09-18 ENCOUNTER — Ambulatory Visit: Payer: Self-pay

## 2008-10-01 ENCOUNTER — Encounter: Payer: Self-pay | Admitting: Internal Medicine

## 2008-10-23 ENCOUNTER — Ambulatory Visit: Payer: Self-pay | Admitting: Cardiovascular Disease

## 2008-10-31 ENCOUNTER — Ambulatory Visit: Payer: Self-pay | Admitting: Internal Medicine

## 2008-11-08 ENCOUNTER — Encounter
Admission: RE | Admit: 2008-11-08 | Discharge: 2009-02-06 | Payer: Self-pay | Admitting: Physical Medicine & Rehabilitation

## 2008-11-13 ENCOUNTER — Ambulatory Visit: Payer: Self-pay | Admitting: Physical Medicine & Rehabilitation

## 2008-11-18 ENCOUNTER — Emergency Department (HOSPITAL_COMMUNITY): Admission: EM | Admit: 2008-11-18 | Discharge: 2008-11-19 | Payer: Self-pay | Admitting: Emergency Medicine

## 2008-11-23 ENCOUNTER — Telehealth: Payer: Self-pay | Admitting: Internal Medicine

## 2008-11-27 ENCOUNTER — Encounter: Payer: Self-pay | Admitting: Internal Medicine

## 2008-11-29 ENCOUNTER — Ambulatory Visit: Payer: Self-pay | Admitting: Internal Medicine

## 2008-11-30 LAB — CONVERTED CEMR LAB
Basophils Absolute: 0.1 10*3/uL (ref 0.0–0.1)
CO2: 29 meq/L (ref 19–32)
Calcium: 8.7 mg/dL (ref 8.4–10.5)
Chloride: 105 meq/L (ref 96–112)
Creatinine, Ser: 0.7 mg/dL (ref 0.4–1.2)
Eosinophils Relative: 1.3 % (ref 0.0–5.0)
Glucose, Bld: 135 mg/dL — ABNORMAL HIGH (ref 70–99)
HCT: 40.9 % (ref 36.0–46.0)
Hemoglobin: 13.8 g/dL (ref 12.0–15.0)
INR: 1 (ref 0.8–1.0)
Lymphocytes Relative: 38.2 % (ref 12.0–46.0)
Lymphs Abs: 3.7 10*3/uL (ref 0.7–4.0)
Monocytes Relative: 7.4 % (ref 3.0–12.0)
Neutro Abs: 5.2 10*3/uL (ref 1.4–7.7)
Prothrombin Time: 10.2 s (ref 9.1–11.7)
RBC: 4.12 M/uL (ref 3.87–5.11)
RDW: 11.5 % (ref 11.5–14.6)
WBC: 9.8 10*3/uL (ref 4.5–10.5)

## 2008-12-06 ENCOUNTER — Inpatient Hospital Stay (HOSPITAL_COMMUNITY): Admission: RE | Admit: 2008-12-06 | Discharge: 2008-12-07 | Payer: Self-pay | Admitting: Internal Medicine

## 2008-12-06 ENCOUNTER — Ambulatory Visit: Payer: Self-pay | Admitting: Internal Medicine

## 2008-12-06 HISTORY — PX: CARDIAC DEFIBRILLATOR PLACEMENT: SHX171

## 2008-12-07 ENCOUNTER — Encounter: Payer: Self-pay | Admitting: Internal Medicine

## 2008-12-20 ENCOUNTER — Encounter: Payer: Self-pay | Admitting: Internal Medicine

## 2008-12-20 ENCOUNTER — Ambulatory Visit: Payer: Self-pay

## 2008-12-25 ENCOUNTER — Ambulatory Visit: Payer: Self-pay | Admitting: Physical Medicine & Rehabilitation

## 2008-12-26 ENCOUNTER — Telehealth: Payer: Self-pay | Admitting: Internal Medicine

## 2008-12-27 ENCOUNTER — Ambulatory Visit: Payer: Self-pay | Admitting: Internal Medicine

## 2008-12-27 DIAGNOSIS — J019 Acute sinusitis, unspecified: Secondary | ICD-10-CM

## 2008-12-31 ENCOUNTER — Telehealth: Payer: Self-pay | Admitting: Internal Medicine

## 2009-01-02 ENCOUNTER — Telehealth: Payer: Self-pay | Admitting: Internal Medicine

## 2009-01-08 ENCOUNTER — Telehealth: Payer: Self-pay | Admitting: Internal Medicine

## 2009-01-28 ENCOUNTER — Ambulatory Visit: Payer: Self-pay

## 2009-01-28 ENCOUNTER — Ambulatory Visit: Payer: Self-pay | Admitting: Internal Medicine

## 2009-02-07 ENCOUNTER — Ambulatory Visit: Payer: Self-pay | Admitting: Internal Medicine

## 2009-02-07 LAB — CONVERTED CEMR LAB
ALT: 12 U/L
AST: 17 U/L
Albumin: 4 g/dL
Alkaline Phosphatase: 43 U/L
BUN: 12 mg/dL
Basophils Absolute: 0.6 10*3/uL — ABNORMAL HIGH
Basophils Relative: 4.5 % — ABNORMAL HIGH
Bilirubin Urine: NEGATIVE
Bilirubin, Direct: 0 mg/dL
CO2: 27 meq/L
Calcium: 9 mg/dL
Chloride: 101 meq/L
Cholesterol: 167 mg/dL
Creatinine, Ser: 0.4 mg/dL
Creatinine,U: 40.5 mg/dL
Eosinophils Absolute: 0.1 10*3/uL
Eosinophils Relative: 0.5 %
GFR calc non Af Amer: 169.1 mL/min
Glucose, Bld: 85 mg/dL
HCT: 44.1 %
HDL: 58.7 mg/dL
Hemoglobin, Urine: NEGATIVE
Hemoglobin: 15.2 g/dL — ABNORMAL HIGH
Hgb A1c MFr Bld: 6.2 %
Ketones, ur: NEGATIVE mg/dL
LDL Cholesterol: 69 mg/dL
Leukocytes, UA: NEGATIVE
Lymphocytes Relative: 32.3 %
Lymphs Abs: 4.3 10*3/uL — ABNORMAL HIGH
MCHC: 34.4 g/dL
MCV: 97.1 fL
Microalb Creat Ratio: 71.6 mg/g — ABNORMAL HIGH
Microalb, Ur: 2.9 mg/dL — ABNORMAL HIGH
Monocytes Absolute: 0.9 10*3/uL
Monocytes Relative: 7 %
Neutro Abs: 7.4 10*3/uL
Neutrophils Relative %: 55.7 %
Nitrite: NEGATIVE
Platelets: 243 10*3/uL
Potassium: 3.7 meq/L
RBC: 4.54 M/uL
RDW: 12.1 %
Sodium: 138 meq/L
Specific Gravity, Urine: <=1.005
TSH: 1.7 u[IU]/mL
Total Bilirubin: 0.6 mg/dL
Total CHOL/HDL Ratio: 3
Total Protein, Urine: NEGATIVE mg/dL
Total Protein: 7.2 g/dL
Triglycerides: 198 mg/dL — ABNORMAL HIGH
Urine Glucose: NEGATIVE mg/dL
Urobilinogen, UA: 0.2
VLDL: 39.6 mg/dL
WBC: 13.3 10*3/uL — ABNORMAL HIGH
pH: 5.5

## 2009-02-12 ENCOUNTER — Ambulatory Visit: Payer: Self-pay | Admitting: Internal Medicine

## 2009-02-12 DIAGNOSIS — G4700 Insomnia, unspecified: Secondary | ICD-10-CM

## 2009-02-12 HISTORY — DX: Insomnia, unspecified: G47.00

## 2009-02-25 ENCOUNTER — Encounter
Admission: RE | Admit: 2009-02-25 | Discharge: 2009-02-28 | Payer: Self-pay | Admitting: Physical Medicine & Rehabilitation

## 2009-02-26 ENCOUNTER — Ambulatory Visit: Payer: Self-pay | Admitting: Physical Medicine & Rehabilitation

## 2009-04-05 ENCOUNTER — Encounter: Payer: Self-pay | Admitting: Internal Medicine

## 2009-04-22 ENCOUNTER — Encounter: Payer: Self-pay | Admitting: Internal Medicine

## 2009-04-25 ENCOUNTER — Ambulatory Visit: Payer: Self-pay | Admitting: Internal Medicine

## 2009-05-06 ENCOUNTER — Ambulatory Visit: Payer: Self-pay | Admitting: Cardiovascular Disease

## 2009-05-06 ENCOUNTER — Emergency Department (HOSPITAL_COMMUNITY): Admission: EM | Admit: 2009-05-06 | Discharge: 2009-05-06 | Payer: Self-pay | Admitting: Family Medicine

## 2009-05-15 ENCOUNTER — Ambulatory Visit: Payer: Self-pay | Admitting: Internal Medicine

## 2009-05-15 DIAGNOSIS — R062 Wheezing: Secondary | ICD-10-CM

## 2009-05-20 ENCOUNTER — Telehealth: Payer: Self-pay | Admitting: Internal Medicine

## 2009-05-20 ENCOUNTER — Encounter
Admission: RE | Admit: 2009-05-20 | Discharge: 2009-08-06 | Payer: Self-pay | Admitting: Physical Medicine & Rehabilitation

## 2009-05-22 ENCOUNTER — Telehealth: Payer: Self-pay | Admitting: Internal Medicine

## 2009-05-27 ENCOUNTER — Telehealth: Payer: Self-pay | Admitting: Internal Medicine

## 2009-05-30 ENCOUNTER — Telehealth: Payer: Self-pay | Admitting: Internal Medicine

## 2009-06-03 ENCOUNTER — Ambulatory Visit: Payer: Self-pay | Admitting: Internal Medicine

## 2009-06-03 DIAGNOSIS — R300 Dysuria: Secondary | ICD-10-CM | POA: Insufficient documentation

## 2009-06-03 DIAGNOSIS — R112 Nausea with vomiting, unspecified: Secondary | ICD-10-CM | POA: Insufficient documentation

## 2009-06-03 DIAGNOSIS — R109 Unspecified abdominal pain: Secondary | ICD-10-CM

## 2009-06-03 LAB — CONVERTED CEMR LAB
ALT: 28 units/L (ref 0–35)
Alkaline Phosphatase: 40 units/L (ref 39–117)
Basophils Absolute: 0.5 10*3/uL — ABNORMAL HIGH (ref 0.0–0.1)
Bilirubin Urine: NEGATIVE
Bilirubin, Direct: 0.1 mg/dL (ref 0.0–0.3)
CO2: 27 meq/L (ref 19–32)
Chloride: 98 meq/L (ref 96–112)
Creatinine, Ser: 0.7 mg/dL (ref 0.4–1.2)
Direct LDL: 116.2 mg/dL
Eosinophils Absolute: 0.1 10*3/uL (ref 0.0–0.7)
Folate: 4.9 ng/mL
HDL: 63.3 mg/dL (ref >39.00)
Hemoglobin, Urine: NEGATIVE
Iron: 220 ug/dL — ABNORMAL HIGH (ref 42–145)
Lymphocytes Relative: 35.2 % (ref 12.0–46.0)
MCHC: 33.3 g/dL (ref 30.0–36.0)
Neutrophils Relative %: 53.9 % (ref 43.0–77.0)
Nitrite: NEGATIVE
RDW: 12.5 % (ref 11.5–14.6)
Sed Rate: 10 mm/hr (ref 0–22)
Sodium: 132 meq/L — ABNORMAL LOW (ref 135–145)
Total Protein, Urine: NEGATIVE mg/dL
Total Protein: 6.8 g/dL (ref 6.0–8.3)
Urobilinogen, UA: 0.2 (ref 0.0–1.0)
VLDL: 43.2 mg/dL — ABNORMAL HIGH (ref 0.0–40.0)

## 2009-06-04 LAB — CONVERTED CEMR LAB: Ferritin: 284.4 ng/mL (ref 10.0–291.0)

## 2009-06-20 ENCOUNTER — Encounter: Payer: Self-pay | Admitting: Internal Medicine

## 2009-06-21 ENCOUNTER — Telehealth: Payer: Self-pay | Admitting: Internal Medicine

## 2009-06-21 ENCOUNTER — Ambulatory Visit: Payer: Self-pay | Admitting: Physical Medicine & Rehabilitation

## 2009-07-01 ENCOUNTER — Ambulatory Visit: Payer: Self-pay | Admitting: Internal Medicine

## 2009-07-08 ENCOUNTER — Ambulatory Visit: Payer: Self-pay

## 2009-08-06 ENCOUNTER — Ambulatory Visit: Payer: Self-pay | Admitting: Physical Medicine & Rehabilitation

## 2009-08-14 ENCOUNTER — Telehealth: Payer: Self-pay | Admitting: Internal Medicine

## 2009-09-10 ENCOUNTER — Ambulatory Visit: Payer: Self-pay | Admitting: Cardiovascular Disease

## 2009-09-16 LAB — CONVERTED CEMR LAB
AST: 21 units/L (ref 0–37)
Albumin: 4.1 g/dL (ref 3.5–5.2)
BUN: 23 mg/dL (ref 6–23)
GFR calc non Af Amer: 64.56 mL/min (ref >60)
Glucose, Bld: 296 mg/dL — ABNORMAL HIGH (ref 70–99)
Potassium: 4 meq/L (ref 3.5–5.1)
Total Protein: 7.2 g/dL (ref 6.0–8.3)

## 2009-09-25 ENCOUNTER — Telehealth: Payer: Self-pay | Admitting: Cardiovascular Disease

## 2009-10-07 ENCOUNTER — Encounter: Payer: Self-pay | Admitting: Internal Medicine

## 2009-10-07 ENCOUNTER — Ambulatory Visit: Payer: Self-pay

## 2009-10-09 ENCOUNTER — Telehealth: Payer: Self-pay | Admitting: Internal Medicine

## 2009-10-18 ENCOUNTER — Encounter: Payer: Self-pay | Admitting: Internal Medicine

## 2009-11-06 ENCOUNTER — Encounter
Admission: RE | Admit: 2009-11-06 | Discharge: 2010-02-04 | Payer: Self-pay | Admitting: Physical Medicine & Rehabilitation

## 2009-11-12 ENCOUNTER — Ambulatory Visit: Payer: Self-pay | Admitting: Physical Medicine & Rehabilitation

## 2009-11-18 ENCOUNTER — Telehealth: Payer: Self-pay | Admitting: Internal Medicine

## 2009-11-18 ENCOUNTER — Ambulatory Visit: Payer: Self-pay | Admitting: Internal Medicine

## 2009-11-18 ENCOUNTER — Encounter
Admission: RE | Admit: 2009-11-18 | Discharge: 2010-01-22 | Payer: Self-pay | Admitting: Physical Medicine & Rehabilitation

## 2009-11-19 LAB — CONVERTED CEMR LAB
ALT: 18 units/L (ref 0–35)
AST: 23 units/L (ref 0–37)
Albumin: 3.7 g/dL (ref 3.5–5.2)
Alkaline Phosphatase: 40 units/L (ref 39–117)
Bilirubin Urine: NEGATIVE
Bilirubin, Direct: 0.1 mg/dL (ref 0.0–0.3)
CO2: 27 meq/L (ref 19–32)
Chloride: 100 meq/L (ref 96–112)
Direct LDL: 85.2 mg/dL
Eosinophils Relative: 1 % (ref 0.0–5.0)
Glucose, Bld: 125 mg/dL — ABNORMAL HIGH (ref 70–99)
HCT: 37.9 % (ref 36.0–46.0)
Hgb A1c MFr Bld: 7.5 % — ABNORMAL HIGH (ref 4.6–6.5)
Ketones, ur: NEGATIVE mg/dL
Lymphocytes Relative: 33.7 % (ref 12.0–46.0)
Lymphs Abs: 3.8 10*3/uL (ref 0.7–4.0)
Microalb Creat Ratio: 2.9 mg/g (ref 0.0–30.0)
Microalb, Ur: 0.9 mg/dL (ref 0.0–1.9)
Monocytes Relative: 6.8 % (ref 3.0–12.0)
Neutrophils Relative %: 57.6 % (ref 43.0–77.0)
Nitrite: POSITIVE
Platelets: 214 10*3/uL (ref 150.0–400.0)
Potassium: 4 meq/L (ref 3.5–5.1)
Sodium: 137 meq/L (ref 135–145)
Total CHOL/HDL Ratio: 4
Total Protein: 6.4 g/dL (ref 6.0–8.3)
WBC: 11.3 10*3/uL — ABNORMAL HIGH (ref 4.5–10.5)

## 2009-12-03 ENCOUNTER — Ambulatory Visit: Payer: Self-pay | Admitting: Internal Medicine

## 2009-12-03 DIAGNOSIS — M25469 Effusion, unspecified knee: Secondary | ICD-10-CM | POA: Insufficient documentation

## 2009-12-04 ENCOUNTER — Encounter: Payer: Self-pay | Admitting: Internal Medicine

## 2009-12-12 ENCOUNTER — Telehealth: Payer: Self-pay | Admitting: Internal Medicine

## 2009-12-13 ENCOUNTER — Encounter: Payer: Self-pay | Admitting: Internal Medicine

## 2009-12-26 ENCOUNTER — Encounter: Payer: Self-pay | Admitting: Internal Medicine

## 2009-12-27 ENCOUNTER — Ambulatory Visit: Payer: Self-pay | Admitting: Internal Medicine

## 2009-12-27 ENCOUNTER — Telehealth (INDEPENDENT_AMBULATORY_CARE_PROVIDER_SITE_OTHER): Payer: Self-pay | Admitting: *Deleted

## 2010-02-04 ENCOUNTER — Encounter: Payer: Self-pay | Admitting: Internal Medicine

## 2010-02-05 ENCOUNTER — Ambulatory Visit: Payer: Self-pay | Admitting: Internal Medicine

## 2010-02-05 DIAGNOSIS — I10 Essential (primary) hypertension: Secondary | ICD-10-CM

## 2010-02-05 HISTORY — DX: Essential (primary) hypertension: I10

## 2010-02-10 ENCOUNTER — Telehealth: Payer: Self-pay | Admitting: Internal Medicine

## 2010-02-12 ENCOUNTER — Encounter
Admission: RE | Admit: 2010-02-12 | Discharge: 2010-04-08 | Payer: Self-pay | Source: Home / Self Care | Attending: Physical Medicine & Rehabilitation | Admitting: Physical Medicine & Rehabilitation

## 2010-02-18 ENCOUNTER — Ambulatory Visit: Payer: Self-pay | Admitting: Physical Medicine & Rehabilitation

## 2010-03-11 ENCOUNTER — Telehealth: Payer: Self-pay | Admitting: Internal Medicine

## 2010-03-14 ENCOUNTER — Telehealth: Payer: Self-pay | Admitting: Internal Medicine

## 2010-03-30 ENCOUNTER — Encounter: Payer: Self-pay | Admitting: Physical Medicine & Rehabilitation

## 2010-04-07 ENCOUNTER — Telehealth (INDEPENDENT_AMBULATORY_CARE_PROVIDER_SITE_OTHER): Payer: Self-pay | Admitting: *Deleted

## 2010-04-08 ENCOUNTER — Encounter: Payer: Self-pay | Admitting: Cardiovascular Disease

## 2010-04-08 ENCOUNTER — Ambulatory Visit
Admission: RE | Admit: 2010-04-08 | Discharge: 2010-04-08 | Payer: Self-pay | Source: Home / Self Care | Attending: Cardiovascular Disease | Admitting: Cardiovascular Disease

## 2010-04-10 NOTE — Progress Notes (Signed)
Summary: OV?   Phone Note Call from Patient   Summary of Call: Pt continues to c/o symptoms same as last office visit and now has nausea, vomiting and sweats. She says she is too weak to come to office. Please advise.  Initial call taken by: Lamar Sprinkles, CMA,  May 27, 2009 1:18 PM  Follow-up for Phone Call        ok for phenergan as needed;  consider ER if dizzy and might be dehydrated Follow-up by: Corwin Levins MD,  May 27, 2009 1:23 PM  Additional Follow-up for Phone Call Additional follow up Details #1::        patient notified. Additional Follow-up by: Lucious Groves,  May 27, 2009 2:56 PM    New/Updated Medications: PROMETHAZINE HCL 25 MG TABS (PROMETHAZINE HCL) 1 by mouth q 6 hrs as needed nausea Prescriptions: PROMETHAZINE HCL 25 MG TABS (PROMETHAZINE HCL) 1 by mouth q 6 hrs as needed nausea  #50 x 0   Entered and Authorized by:   Corwin Levins MD   Signed by:   Corwin Levins MD on 05/27/2009   Method used:   [id number]       [address]       [place], [place]  [address]       Ph: [phone] or [phone]       Fax: [phone]   RxID:   [id number]

## 2010-04-10 NOTE — Progress Notes (Signed)
Summary: Rx change  Phone Note Call from Patient   Caller: Patient Summary of Call: pt called stating that due to nausea and vomiting, she is unable to keep down phenergan tabs. pt is requesting new Rx for suppositories. Rx sent Initial call taken by: Margaret Pyle, CMA,  May 30, 2009 11:54 AM    New/Updated Medications: PROMETHAZINE HCL 25 MG SUPP (PROMETHAZINE HCL) 1 by mouth q6hr as needed Prescriptions: PROMETHAZINE HCL 25 MG SUPP (PROMETHAZINE HCL) 1 by mouth q6hr as needed  #50 x 0   Entered by:   Margaret Pyle, CMA   Authorized by:   Corwin Levins MD   Signed by:   Margaret Pyle, CMA on 05/30/2009   Method used:   [id number]       [address]       [place], [place]  [address]       Ph: [phone] or [phone]       Fax: [phone]   RxID:   [id number]

## 2010-04-10 NOTE — Cardiovascular Report (Signed)
Summary: Office Visit   Office Visit   Imported By: Roderic Ovens 02/11/2010 10:40:53  _____________________________________________________________________  External Attachment:    Type:   Image     Comment:   External Document
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
## 2010-04-10 NOTE — Progress Notes (Signed)
  Phone Note Refill Request Message from:  Fax from Pharmacy on March 11, 2010 9:13 AM  Refills Requested: Medication #1:  BUPROPION HCL 150 MG XR12H-TAB Take 1 tablet daily for three days   Dosage confirmed as above?Dosage Confirmed   Last Refilled: 12/12/2009   Notes: [facility] [address], [phone] Initial call taken by: [name] Ewing CMA Duncan Dull),  March 11, 2010 9:13 AM    Prescriptions: BUPROPION HCL 150 MG XR12H-TAB (BUPROPION HCL) Take 1 tablet daily for three days, then begin 1 tablet twice daily for 12 weeks.  #60 x 2   Entered by:   Scharlene Gloss CMA (AAMA)   Authorized by:   Corwin Levins MD   Signed by:   Scharlene Gloss CMA (AAMA) on 03/11/2010   Method used:   [facility]ed to ...       [facility] [id number]       [address]       [place], [place]  [address]       Ph: [phone] or [phone]       Fax: [phone]   RxID:   [id number]

## 2010-04-10 NOTE — Progress Notes (Signed)
Summary: UTI  Phone Note Outgoing Call   Call placed by: [name] Call placed to: Patient Summary of Call: Called pt. and patient was on Cipro one month ago for 3 days. Yesterday began hurting again and definitely wants something called in as is very uncomftorable. [facility] [address] Initial call taken by: [name] Ewing CMA Duncan Dull),  November 18, 2009 3:13 PM  Follow-up for Phone Call        done per emr Follow-up by: Corwin Levins MD,  November 18, 2009 3:27 PM    New/Updated Medications: CIPROFLOXACIN HCL 500 MG TABS (CIPROFLOXACIN HCL) 1 by mouth two times a day Prescriptions: CIPROFLOXACIN HCL 500 MG TABS (CIPROFLOXACIN HCL) 1 by mouth two times a day  #20 x 0   Entered and Authorized by:   Corwin Levins MD   Signed by:   Corwin Levins MD on 11/18/2009   Method used:   [facility]        [id number]       [address]       [place], [place]  [address]       Ph: [phone] or [phone]       Fax: [phone]   RxID:   [id number]
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
## 2010-04-10 NOTE — Medication Information (Signed)
Summary: Diabetes Supplies/[facility]  Diabetes Supplies/[facility]   Imported By: Sherian Rein 04/09/2009 11:27:18  _____________________________________________________________________  External Attachment:    Type:   Image     Comment:   External Document

## 2010-04-10 NOTE — Progress Notes (Signed)
Summary: pt?  Phone Note Call from Patient Call back at Home Phone [phone]   Caller: Patient Summary of Call: pt called stating that she is still very sick and would like MD's advisement Initial call taken by: Margaret Pyle, CMA,  May 20, 2009 2:32 PM  Follow-up for Phone Call        can change antibx - done per emr Follow-up by: Corwin Levins MD,  May 20, 2009 3:39 PM  Additional Follow-up for Phone Call Additional follow up Details #1::        pt informed Additional Follow-up by: Margaret Pyle, CMA,  May 20, 2009 4:12 PM    New/Updated Medications: DOXYCYCLINE HYCLATE 100 MG CAPS (DOXYCYCLINE HYCLATE) 1 by mouth two times a day Prescriptions: DOXYCYCLINE HYCLATE 100 MG CAPS (DOXYCYCLINE HYCLATE) 1 by mouth two times a day  #20 x 0   Entered and Authorized by:   Corwin Levins MD   Signed by:   Corwin Levins MD on 05/20/2009   Method used:   Electronically to        [facility]  [address] [id number] (retail)       [address]       [place], [place]  [address]       Ph: [phone] or [phone]       Fax: [phone]   RxID:   [id number]
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
## 2010-04-10 NOTE — Letter (Signed)
Summary: Diabetes Supplies/[facility]  Diabetes Supplies/[facility]   Imported By: Sherian Rein 12/18/2009 11:29:38  _____________________________________________________________________  External Attachment:    Type:   Image     Comment:   External Document
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
## 2010-04-10 NOTE — Cardiovascular Report (Signed)
Summary: Office Visit   Office Visit   Imported By: Roderic Ovens 10/16/2009 13:25:42  _____________________________________________________________________  External Attachment:    Type:   Image     Comment:   External Document

## 2010-04-10 NOTE — Progress Notes (Signed)
  Phone Note Refill Request Message from:  Fax from Pharmacy on December 12, 2009 10:42 AM  Refills Requested: Medication #1:  BUPROPION HCL 150 MG XR12H-TAB Take 1 tablet daily for three days   Dosage confirmed as above?Dosage Confirmed   Last Refilled: 09/2009   Notes: [facility] [address] Initial call taken by: [name] Ewing CMA Duncan Dull),  December 12, 2009 10:42 AM    Prescriptions: BUPROPION HCL 150 MG XR12H-TAB (BUPROPION HCL) Take 1 tablet daily for three days, then begin 1 tablet twice daily for 12 weeks.  #60 x 2   Entered by:   Scharlene Gloss CMA (AAMA)   Authorized by:   Corwin Levins MD   Signed by:   Scharlene Gloss CMA (AAMA) on 12/12/2009   Method used:   Faxed to ...       [id number] (retail)       [address]       [place], [place]  [address]       Ph: [phone] or [phone]       Fax: [phone]   RxID:   [id number]

## 2010-04-10 NOTE — Letter (Signed)
[facility]   Imported By: Sherian Rein 06/25/2009 11:58:12  _____________________________________________________________________  External Attachment:    Type:   Image     Comment:   External Document
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
## 2010-04-10 NOTE — Progress Notes (Signed)
Summary: dental office has questions/pls call monday  Phone Note From Other Clinic   Caller: christy [phone] dr longstokes pls call monday Summary of Call: pt scheduled for deep cleaning/dental office wants to know if this is ok/any precautions Initial call taken by: Glynda Jaeger,  March 14, 2010 1:04 PM  Follow-up for Phone Call        per dr Neve Branscomb, no precautions. left a voice mail for christy. Deliah Goody, RN  March 14, 2010 4:37 PM
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
## 2010-04-10 NOTE — Letter (Signed)
Summary: [facility]  [facility]   Imported By: Lester Harvard 10/28/2009 09:58:33  _____________________________________________________________________  External Attachment:    Type:   Image     Comment:   External Document
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
## 2010-04-10 NOTE — Progress Notes (Signed)
Summary: HH aid?  Phone Note Other Incoming   Caller: Case Research scientist (medical). Annmarie [phone] Summary of Call: Case manager called to request MD order [facility] aid for pt. pt has been unable to check her blood sugars because of shaking and is also unable to  completely care for herself in the home.  Initial call taken by: Margaret Pyle, CMA,  May 22, 2009 11:23 AM  Follow-up for Phone Call        OK for [facility] aide - verbal Follow-up by: Corwin Levins MD,  May 22, 2009 12:12 PM  Additional Follow-up for Phone Call Additional follow up Details #1::        case manager number busy. Margaret Pyle, CMA  May 22, 2009 2:08 PM   # still busy. Margaret Pyle, CMA  May 22, 2009 4:31 PM  # busy. contacted pt at home, left message on machine for pt to return my call  Additional Follow-up by: Margaret Pyle, CMA,  May 23, 2009 2:13 PM    Additional Follow-up for Phone Call Additional follow up Details #2::    contacted pt who stated that she is much better now and she is no longer shaking. pt states that she can check CBGs with no problem. pt says that if she thinks she would need aid she will call back. Follow-up by: Margaret Pyle, CMA,  May 24, 2009 3:14 PM

## 2010-04-10 NOTE — Progress Notes (Signed)
Summary: medication refill  Phone Note Refill Request Message from:  Fax from Pharmacy on February 10, 2010 10:14 AM  Refills Requested: Medication #1:  ALPRAZOLAM 1 MG TABS 1 by mouth qid as needed   Dosage confirmed as above?Dosage Confirmed   Last Refilled: 10/09/2009   Notes: [facility] [address] [phone]  Medication #2:  ZOLPIDEM TARTRATE 10 MG TABS 1 by mouth at bedtime   Dosage confirmed as above?Dosage Confirmed   Last Refilled: 08/14/2009   Notes: [facility] [address] [phone] Initial call taken by: [name] Ewing CMA (AAMA),  February 10, 2010 10:14 AM    New/Updated Medications: ZOLPIDEM TARTRATE 10 MG TABS (ZOLPIDEM TARTRATE) 1 by mouth at bedtime Prescriptions: ZOLPIDEM TARTRATE 10 MG TABS (ZOLPIDEM TARTRATE) 1 by mouth at bedtime  #30 x 5   Entered and Authorized by:   Corwin Levins MD   Signed by:   Corwin Levins MD on 02/10/2010   Method used:   Print then Give to Patient   RxID:   [id number] ALPRAZOLAM 1 MG TABS (ALPRAZOLAM) 1 by mouth qid as needed  #120 x 3   Entered and Authorized by:   Corwin Levins MD   Signed by:   Corwin Levins MD on 02/10/2010   Method used:   Print then Give to Patient   RxID:   [id number]  done hardcopy to LIM side B - dahlia Corwin Levins MD  February 10, 2010 12:58 PM   Rxs faxed to pharmacy Margaret Pyle, CMA  February 10, 2010 1:11 PM

## 2010-04-10 NOTE — Progress Notes (Signed)
Summary: Med Refill  Phone Note Refill Request  on June 21, 2009 8:26 AM  Refills Requested: Medication #1:  ALPRAZOLAM 1 MG TABS 1 by mouth qid as needed   Dosage confirmed as above?Dosage Confirmed   Last Refilled: 02/12/2009   Notes: [facility] [address] [phone] Initial call taken by: Scharlene Gloss,  June 21, 2009 8:27 AM  Follow-up for Phone Call        Rx faxed to pharmacy Follow-up by: Margaret Pyle, CMA,  June 21, 2009 8:45 AM    New/Updated Medications: ALPRAZOLAM 1 MG TABS (ALPRAZOLAM) 1 by mouth qid as needed Prescriptions: ALPRAZOLAM 1 MG TABS (ALPRAZOLAM) 1 by mouth qid as needed  #120 x 3   Entered and Authorized by:   Corwin Levins MD   Signed by:   Corwin Levins MD on 06/21/2009   Method used:   Print then Give to Patient   RxID:   [id number]  done hardcopy to LIM side B - dahlia Corwin Levins MD  June 21, 2009 8:42 AM

## 2010-04-10 NOTE — Procedures (Signed)
Summary: device/saf   Current Medications (verified): 1)  Levothyroxine Sodium 50 Mcg Tabs (Levothyroxine Sodium) .Marland Kitchen.. 1 By Mouth Once Daily 2)  Lipitor 40 Mg  Tabs (Atorvastatin Calcium) .Marland Kitchen.. 1 By Mouth Once Daily 3)  Omeprazole 20 Mg  Cpdr (Omeprazole) .... 2 By Mouth Once Daily 4)  Glimepiride 2 Mg Tabs (Glimepiride) .Marland Kitchen.. 1 By Mouth Once Daily 5)  Onetouch Ultra 2 W/device Kit (Blood Glucose Monitoring Suppl) .... Use Asd 6)  Onetouch Ultra Test  Strp (Glucose Blood) .... Use Asd 1 Once Daily 7)  Onetouch Ultrasoft Lancets  Misc (Lancets) .... Use Asd 1 Once Daily 8)  Furosemide 20 Mg Tabs (Furosemide) .Marland Kitchen.. 1 Tab Once Daily 9)  Aspirin 81 Mg Tbec (Aspirin) .... Take One Tablet By Mouth Daily 10)  Lisinopril 20 Mg Tabs (Lisinopril) .... Take One Tablet By Mouth Daily 11)  Metoprolol Tartrate 25 Mg Tabs (Metoprolol Tartrate) .Marland Kitchen.. 1 Tab Two Times A Day 12)  Hydrocodone-Acetaminophen 7.5-325 Mg Tabs (Hydrocodone-Acetaminophen) .Marland Kitchen.. 1po Qid As Needed - Per Pain Center 13)  Desoximetasone 0.25 % Crea (Desoximetasone) .... Apply To Skin Two Times A Day 14)  Premarin 1.25 Mg Tabs (Estrogens Conjugated) .Marland Kitchen.. 1 Tab Once Daily 15)  Alprazolam 1 Mg Tabs (Alprazolam) .Marland Kitchen.. 1 By Mouth Qid As Needed 16)  Bupropion Hcl 150 Mg Xr12h-Tab (Bupropion Hcl) .... Take 1 Tablet Daily For Three Days, Then Begin 1 Tablet Twice Daily For 12 Weeks. 17)  Flonase 50 Mcg/act Susp (Fluticasone Propionate) .Marland Kitchen.. 1 Spray Each Nostril  Every Morning 18)  Zolpidem Tartrate 10 Mg Tabs (Zolpidem Tartrate) .Marland Kitchen.. 1 By Mouth At Bedtime 19)  Gentak 0.3 % Oint (Gentamicin Sulfate) .... Apply A Small Amouint To Right Eye Three Times Every Day 20)  Hydrocodone-Homatropine 5-1.5 Mg/54ml Syrp (Hydrocodone-Homatropine) .Marland Kitchen.. 1 Tsp By Mouth Q 6 Hrs As Needed 21)  Promethazine Hcl 25 Mg Supp (Promethazine Hcl) .Marland Kitchen.. 1 Pr Q6hr As Needed  Allergies (verified): 1)  ! Sulfa 2)  ! Keflex 3)  ! Depakote 4)  ! Levaquin 5)  ! * Dilaudid 6)   Simvastatin 7)  * Metformin   ICD Specifications Following MD:  Hillis Range, MD     Referring MD:  [facility] ICD Vendor:  St Jude     ICD Model Number:  [id number]     ICD Serial Number:  [id number] ICD DOI:  12/06/2008     ICD Implanting MD:  Hillis Range, MD  Lead 1:    Location: RA     DOI: 12/06/2008     Model #: [id number]     Serial #: [id number]     Status: active Lead 2:    Location: RV     DOI: 12/06/2008     Model #: 7122     Serial #: [id number]     Status: active Lead 3:    Location: LV     DOI: 12/06/2008     Model #: 1258T     Serial #: [id number]     Status: active  Indications::  ICM, CHF   ICD Follow Up Remote Check?  No Battery Voltage:  3.19 V     Charge Time:  11.4 seconds     Battery Est. Longevity:  5.7 years Underlying rhythm:  SR ICD Dependent:  No       ICD Device Measurements Atrium:  Amplitude: 1.7 mV, Impedance: 390 ohms, Threshold: 0.75 V at 0.4 msec Right Ventricle:  Amplitude: 12 mV, Impedance: 630 ohms, Threshold: 0.75 V at  0.4 msec Left Ventricle:  Impedance: 490 ohms, Threshold: 1.0 V at 0.5 msec Configuration: LV RING TO RV COIL Shock Impedance: 73 ohms   Episodes MS Episodes:  4     Percent Mode Switch:  <1%     Coumadin:  No Shock:  0     ATP:  0     Nonsustained:  0      Brady Parameters Mode DDD     Lower Rate Limit:  60     Upper Rate Limit 120 PAV 170     Sensed AV Delay:  120 Rate Response Parameters:  negative hysteresis on @ -10ms w shortest A-V delay @ 90ms  Tachy Zones VF:  200     VT:  171 (MONITOR)     Next Cardiology Appt Due:  10/07/2009 Tech Comments:  quick opt done SAV .  Device function normal.  ROV 3 months clinic. Altha Harm, LPN  Jul 08, 4008 12:38 PM    Patient Instructions: 1)  Follow up with the device clinic in 3 months. 2)  Altha Harm, LPN  Jul 09, 2723 12:33 PM

## 2010-04-10 NOTE — Procedures (Signed)
Summary: device check   Current Medications (verified): 1)  Levothyroxine Sodium 50 Mcg Tabs (Levothyroxine Sodium) .Marland Kitchen.. 1 By Mouth Once Daily 2)  Lipitor 40 Mg  Tabs (Atorvastatin Calcium) .Marland Kitchen.. 1 By Mouth Once Daily 3)  Omeprazole 20 Mg  Cpdr (Omeprazole) .... 2 By Mouth Once Daily 4)  Glimepiride 4 Mg Tabs (Glimepiride) .... Take 1 Tablet By Mouth Once Daily 5)  Onetouch Ultra 2 W/device Kit (Blood Glucose Monitoring Suppl) .... Use Asd 6)  Onetouch Ultra Test  Strp (Glucose Blood) .... Use Asd 1 Once Daily 7)  Onetouch Ultrasoft Lancets  Misc (Lancets) .... Use Asd 1 Once Daily 8)  Furosemide 20 Mg Tabs (Furosemide) .Marland Kitchen.. 1 Tab Once Daily 9)  Aspirin 81 Mg Tbec (Aspirin) .... Take One Tablet By Mouth Daily 10)  Lisinopril 20 Mg Tabs (Lisinopril) .... Take One Tablet By Mouth Daily 11)  Metoprolol Tartrate 25 Mg Tabs (Metoprolol Tartrate) .Marland Kitchen.. 1 Tab Two Times A Day 12)  Hydrocodone-Acetaminophen 7.5-325 Mg Tabs (Hydrocodone-Acetaminophen) .Marland Kitchen.. 1po Qid As Needed - Per Pain Center 13)  Desoximetasone 0.25 % Crea (Desoximetasone) .... Apply To Skin Two Times A Day 14)  Premarin 1.25 Mg Tabs (Estrogens Conjugated) .Marland Kitchen.. 1 Tab Once Daily 15)  Alprazolam 1 Mg Tabs (Alprazolam) .Marland Kitchen.. 1 By Mouth Qid As Needed 16)  Bupropion Hcl 150 Mg Xr12h-Tab (Bupropion Hcl) .... Take 1 Tablet Daily For Three Days, Then Begin 1 Tablet Twice Daily For 12 Weeks. 17)  Flonase 50 Mcg/act Susp (Fluticasone Propionate) .Marland Kitchen.. 1 Spray Each Nostril  Every Morning 18)  Zolpidem Tartrate 10 Mg Tabs (Zolpidem Tartrate) .Marland Kitchen.. 1 By Mouth At Bedtime 19)  Gentak 0.3 % Oint (Gentamicin Sulfate) .... Apply A Small Amouint To Right Eye Three Times Every Day 20)  Hydrocodone-Homatropine 5-1.5 Mg/60ml Syrp (Hydrocodone-Homatropine) .Marland Kitchen.. 1 Tsp By Mouth Q 6 Hrs As Needed 21)  Promethazine Hcl 25 Mg Supp (Promethazine Hcl) .Marland Kitchen.. 1 Pr Q6hr As Needed 22)  Glimepiride 4 Mg Tabs (Glimepiride) .Marland Kitchen.. 1 By Mouth Once Daily  Allergies  (verified): 1)  ! Sulfa 2)  ! Keflex 3)  ! Depakote 4)  ! Levaquin 5)  ! * Dilaudid 6)  Simvastatin 7)  * Metformin   ICD Specifications Following MD:  Hillis Range, MD     Referring MD:  [facility] ICD Vendor:  St Jude     ICD Model Number:  ZO1096-04     ICD Serial Number:  [id number] ICD DOI:  12/06/2008     ICD Implanting MD:  Hillis Range, MD  Lead 1:    Location: RA     DOI: 12/06/2008     Model #: 1688TC     Serial #: [id number]     Status: active Lead 2:    Location: RV     DOI: 12/06/2008     Model #: 7122     Serial #: [id number]     Status: active Lead 3:    Location: LV     DOI: 12/06/2008     Model #: 1258T     Serial #: [id number]     Status: active  Indications::  ICM, CHF   ICD Follow Up Battery Voltage:  3.17 V     Charge Time:  11.3 seconds     Battery Est. Longevity:  5.3 YRS Underlying rhythm:  SR ICD Dependent:  No       ICD Device Measurements Atrium:  Amplitude: 2.4 mV, Impedance: 440 ohms, Threshold: 0.75 V at 0.4 msec Right  Ventricle:  Amplitude: 12.0 mV, Impedance: 750 ohms, Threshold: 0.75 V at 0.4 msec Configuration: LV RING TO RV COIL Shock Impedance: 72 ohms   Episodes MS Episodes:  1     Percent Mode Switch:  <1%     Coumadin:  No Shock:  0     ATP:  0     Nonsustained:  0     Atrial Therapies:  0 Atrial Pacing:  5.3%     Ventricular Pacing:  >99%  Brady Parameters Mode DDD     Lower Rate Limit:  60     Upper Rate Limit 120 PAV 170     Sensed AV Delay:  120 Rate Response Parameters:  negative hysteresis on @ -10ms w shortest A-V delay @ 90ms  Tachy Zones VF:  200     VT:  171 (MONITOR)     Next Cardiology Appt Due:  01/07/2010 Tech Comments:  NORMAL DEVICE FUNCTION. NO EPISODES SINCE LAST CHECK.  CHANGED RV OUTPUT FROM 2.0 TO 2.5 V.  ROV IN 3 MTHS W/JA.  Vella Kohler  October 07, 2009 2:13 PM   Patient Instructions: 1)  Appointment with Dr Johney Frame in 3 mths.

## 2010-04-10 NOTE — Progress Notes (Signed)
Summary: Pt going to dentist want to know if antibiotic is needed  Phone Note Call from Patient Call back at Home Phone [phone]   Caller: Patient Summary of Call: Pt going to dentist on Monday and want to know  if she needs anibiotic Initial call taken by: Judie Grieve,  September 25, 2009 2:01 PM  Follow-up for Phone Call        pt aware no antibiotic needed Meredith Staggers, RN  September 25, 2009 2:09 PM

## 2010-04-10 NOTE — Progress Notes (Signed)
Summary: Medication Refill  Phone Note Refill Request  on August 14, 2009 5:01 PM  Refills Requested: Medication #1:  ZOLPIDEM TARTRATE 10 MG TABS 1 by mouth at bedtime   Dosage confirmed as above?Dosage Confirmed   Notes: [facility] [address] [phone] Initial call taken by: Scharlene Gloss,  August 14, 2009 5:01 PM  Follow-up for Phone Call        Rx faxed to pharmacy Follow-up by: Margaret Pyle, CMA,  August 15, 2009 7:45 AM    New/Updated Medications: ZOLPIDEM TARTRATE 10 MG TABS (ZOLPIDEM TARTRATE) 1 by mouth at bedtime Prescriptions: ZOLPIDEM TARTRATE 10 MG TABS (ZOLPIDEM TARTRATE) 1 by mouth at bedtime  #30 x 5   Entered and Authorized by:   Corwin Levins MD   Signed by:   Corwin Levins MD on 08/14/2009   Method used:   Print then Give to Patient   RxID:   [id number]  done hardcopy to LIM side B - dahlia  Corwin Levins MD  August 14, 2009 5:40 PM

## 2010-04-10 NOTE — Letter (Signed)
Summary: CMN for [facility]  CMN for [facility]   Imported By: Sherian Rein 04/25/2009 11:45:23  _____________________________________________________________________  External Attachment:    Type:   Image     Comment:   External Document
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
## 2010-04-10 NOTE — Progress Notes (Signed)
Summary: NEEDS OV  Phone Note Call from Patient Call back at [facility] Phone [phone]   Summary of Call: Pt is c/o "chest cold". She needs office visit for eval, please help set up. THANKS Initial call taken by: Lamar Sprinkles, CMA,  December 27, 2009 11:53 AM  Follow-up for Phone Call        Pt sched for appt 10/21 @3 :45pm. Follow-up by: Verdell Face,  December 27, 2009 12:25 PM

## 2010-04-10 NOTE — Letter (Signed)
Summary: [facility]   Imported By: Lester Centereach 12/18/2009 07:42:40  _____________________________________________________________________  External Attachment:    Type:   Image     Comment:   External Document

## 2010-04-10 NOTE — Progress Notes (Signed)
Summary: Medication Refill  Phone Note Refill Request Message from:  Fax from Pharmacy on October 09, 2009 9:05 AM  Refills Requested: Medication #1:  ALPRAZOLAM 1 MG TABS 1 by mouth qid as needed   Dosage confirmed as above?Dosage Confirmed   Last Refilled: 06/21/2009   Notes: [facility] [address], [phone] Initial call taken by: [name] Ewing CMA Duncan Dull),  October 09, 2009 9:05 AM  Follow-up for Phone Call        faxed hardcopy to pharmacy. Follow-up by: Zella Ball Ewing CMA Duncan Dull),  October 09, 2009 2:47 PM    Prescriptions: ALPRAZOLAM 1 MG TABS (ALPRAZOLAM) 1 by mouth qid as needed  #120 x 3   Entered and Authorized by:   Corwin Levins MD   Signed by:   Corwin Levins MD on 10/09/2009   Method used:   Print then Give to Patient   RxID:   [id number]  done hardcopy to LIM side B - dahlia  Corwin Levins MD  October 09, 2009 2:41 PM

## 2010-04-10 NOTE — Letter (Signed)
Summary: [facility]  [facility]   Imported By: Sherian Rein 12/31/2009 11:49:47  _____________________________________________________________________  External Attachment:    Type:   Image     Comment:   External Document
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
## 2010-04-16 NOTE — Progress Notes (Signed)
Summary: appt set in [facility] for device clinic 04/21/10 @ 12:00  Phone Note Outgoing Call Call back at [facility] Phone [phone]   Summary of Call: CMA s/w pt today to schedule device check in Feb as per [facility] with device clinic on 04/21/10 @ 12:00. Pt aware of appt date and time. Danielle Rankin, CMA  April 07, 2010 1:54 PM

## 2010-04-21 ENCOUNTER — Encounter (INDEPENDENT_AMBULATORY_CARE_PROVIDER_SITE_OTHER): Payer: Medicare PPO

## 2010-04-21 ENCOUNTER — Encounter: Payer: Self-pay | Admitting: Internal Medicine

## 2010-04-21 DIAGNOSIS — I428 Other cardiomyopathies: Secondary | ICD-10-CM

## 2010-04-30 NOTE — Cardiovascular Report (Signed)
Summary: Office Visit   Office Visit   Imported By: Roderic Ovens 04/22/2010 15:40:23  _____________________________________________________________________  External Attachment:    Type:   Image     Comment:   External Document

## 2010-04-30 NOTE — Procedures (Signed)
Summary: as per Gunnar Fusi to schedule pt in Feb. cmf   Current Medications (verified): 1)  Levothyroxine Sodium 50 Mcg Tabs (Levothyroxine Sodium) .Marland Kitchen.. 1 By Mouth Once Daily 2)  Lipitor 40 Mg  Tabs (Atorvastatin Calcium) .Marland Kitchen.. 1 By Mouth Once Daily 3)  Omeprazole 20 Mg  Cpdr (Omeprazole) .... 2 By Mouth Once Daily 4)  Glimepiride 2 Mg Tabs (Glimepiride) .... 2 Tablets Am and 1/2 Pm 5)  Onetouch Ultra Test  Strp (Glucose Blood) .... Use Asd 1 Once Daily 6)  Onetouch Ultrasoft Lancets  Misc (Lancets) .... Use Asd 1 Once Daily 7)  Furosemide 20 Mg Tabs (Furosemide) .... Take One Tablet By Mouth Daily. 8)  Aspirin 81 Mg Tbec (Aspirin) .... Take One Tablet By Mouth Daily 9)  Lisinopril 20 Mg Tabs (Lisinopril) .... Take One Tablet By Mouth Daily 10)  Metoprolol Tartrate 25 Mg Tabs (Metoprolol Tartrate) .Marland Kitchen.. 1 Tab Two Times A Day 11)  Hydrocodone-Acetaminophen 7.5-325 Mg Tabs (Hydrocodone-Acetaminophen) .Marland Kitchen.. 1po Qid As Needed - Per Pain Center 12)  Desoximetasone 0.25 % Crea (Desoximetasone) .... Apply To Skin Two Times A Day 13)  Premarin 1.25 Mg Tabs (Estrogens Conjugated) .Marland Kitchen.. 1 Tab Once Daily 14)  Alprazolam 1 Mg Tabs (Alprazolam) .Marland Kitchen.. 1 By Mouth Qid As Needed 15)  Bupropion Hcl 150 Mg Xr12h-Tab (Bupropion Hcl) .... Take 1 Tablet Daily For Three Days, Then Begin 1 Tablet Twice Daily For 12 Weeks. 16)  Flonase 50 Mcg/act Susp (Fluticasone Propionate) .Marland Kitchen.. 1 Spray Each Nostril  Every Morning 17)  Zolpidem Tartrate 10 Mg Tabs (Zolpidem Tartrate) .Marland Kitchen.. 1 By Mouth At Bedtime 18)  Gentak 0.3 % Oint (Gentamicin Sulfate) .... As Needed  Allergies (verified): 1)  ! Sulfa 2)  ! Keflex 3)  ! Depakote 4)  ! Levaquin 5)  ! * Dilaudid 6)  Simvastatin 7)  * Metformin   ICD Specifications Following MD:  Hillis Range, MD     Referring MD:  [facility] ICD Vendor:  [employer]     ICD Model Number:  [id number]     ICD Serial Number:  [id number] ICD DOI:  12/06/2008     ICD Implanting MD:  Hillis Range, MD  Lead 1:     Location: RA     DOI: 12/06/2008     Model #: [id number]     Serial #: [id number]     Status: active Lead 2:    Location: RV     DOI: 12/06/2008     Model #: 7122     Serial #: [id number]     Status: active Lead 3:    Location: LV     DOI: 12/06/2008     Model #: 1258T     Serial #: [id number]     Status: active  Indications::  ICM, CHF   ICD Follow Up ICD Dependent:  No       ICD Device Measurements Configuration: LV RING TO RV COIL  Episodes Coumadin:  No  Brady Parameters Mode DDD     Lower Rate Limit:  60     Upper Rate Limit 120 PAV 170     Sensed AV Delay:  120 Rate Response Parameters:  negative hysteresis on @ -10ms w shortest A-V delay @ 90ms  Tachy Zones VF:  200     VT:  171 (MONITOR)     Tech Comments:  See Arita Miss Art MD Comments:  see scanned report

## 2010-05-19 ENCOUNTER — Telehealth: Payer: Self-pay | Admitting: Internal Medicine

## 2010-05-20 ENCOUNTER — Encounter: Payer: Medicare PPO | Attending: Physical Medicine & Rehabilitation

## 2010-05-20 ENCOUNTER — Ambulatory Visit: Payer: Medicare PPO | Admitting: Physical Medicine & Rehabilitation

## 2010-05-20 DIAGNOSIS — M47812 Spondylosis without myelopathy or radiculopathy, cervical region: Secondary | ICD-10-CM | POA: Insufficient documentation

## 2010-05-20 DIAGNOSIS — M25519 Pain in unspecified shoulder: Secondary | ICD-10-CM | POA: Insufficient documentation

## 2010-05-20 DIAGNOSIS — M7500 Adhesive capsulitis of unspecified shoulder: Secondary | ICD-10-CM

## 2010-05-20 DIAGNOSIS — M542 Cervicalgia: Secondary | ICD-10-CM | POA: Insufficient documentation

## 2010-05-20 DIAGNOSIS — M47817 Spondylosis without myelopathy or radiculopathy, lumbosacral region: Secondary | ICD-10-CM | POA: Insufficient documentation

## 2010-05-20 DIAGNOSIS — M25819 Other specified joint disorders, unspecified shoulder: Secondary | ICD-10-CM | POA: Insufficient documentation

## 2010-05-27 ENCOUNTER — Ambulatory Visit: Payer: Medicare PPO

## 2010-05-27 DIAGNOSIS — E785 Hyperlipidemia, unspecified: Secondary | ICD-10-CM

## 2010-05-27 LAB — BASIC METABOLIC PANEL
BUN: 10 mg/dL (ref 6–23)
Chloride: 101 mEq/L (ref 96–112)
Creatinine, Ser: 0.6 mg/dL (ref 0.4–1.2)
GFR: 99.72 mL/min (ref >60.00)

## 2010-05-27 LAB — LIPID PANEL
Cholesterol: 141 mg/dL (ref 0–200)
Total CHOL/HDL Ratio: 4

## 2010-05-27 NOTE — Progress Notes (Signed)
  Phone Note Refill Request Message from:  Fax from Pharmacy on May 19, 2010 2:18 PM  Refills Requested: Medication #1:  BUPROPION HCL 150 MG XR12H-TAB Take 1 tablet daily for three days   Dosage confirmed as above?Dosage Confirmed   Notes: [facility] [address] Initial call taken by: [name] Ewing CMA Duncan Dull),  May 19, 2010 2:19 PM    Prescriptions: BUPROPION HCL 150 MG XR12H-TAB (BUPROPION HCL) Take 1 tablet daily for three days, then begin 1 tablet twice daily for 12 weeks.  #60 x 2   Entered by:   Scharlene Gloss CMA (AAMA)   Authorized by:   Corwin Levins MD   Signed by:   Scharlene Gloss CMA (AAMA) on 05/19/2010   Method used:   Faxed to ...       [facility]  [id number] ([facility]       [address]       [place], [place]  [address]       Ph: [phone] or [phone]       Fax: [phone]   RxID:   [id number]

## 2010-05-30 ENCOUNTER — Encounter: Payer: Self-pay | Admitting: Internal Medicine

## 2010-05-30 ENCOUNTER — Ambulatory Visit (INDEPENDENT_AMBULATORY_CARE_PROVIDER_SITE_OTHER): Payer: Medicare PPO | Admitting: Internal Medicine

## 2010-05-30 DIAGNOSIS — Z0000 Encounter for general adult medical examination without abnormal findings: Secondary | ICD-10-CM | POA: Insufficient documentation

## 2010-05-30 DIAGNOSIS — Z79890 Hormone replacement therapy (postmenopausal): Secondary | ICD-10-CM

## 2010-05-30 DIAGNOSIS — M25519 Pain in unspecified shoulder: Secondary | ICD-10-CM

## 2010-05-30 DIAGNOSIS — E119 Type 2 diabetes mellitus without complications: Secondary | ICD-10-CM

## 2010-05-30 DIAGNOSIS — M25511 Pain in right shoulder: Secondary | ICD-10-CM

## 2010-05-30 DIAGNOSIS — F411 Generalized anxiety disorder: Secondary | ICD-10-CM

## 2010-05-30 DIAGNOSIS — I10 Essential (primary) hypertension: Secondary | ICD-10-CM

## 2010-05-30 DIAGNOSIS — E785 Hyperlipidemia, unspecified: Secondary | ICD-10-CM

## 2010-05-30 MED ORDER — SITAGLIPTIN PHOSPHATE 50 MG PO TABS: 50.0000 mg | ORAL_TABLET | Freq: Every day | ORAL | Status: DC

## 2010-05-30 MED ORDER — DESOXIMETASONE 0.25 % EX CREA: TOPICAL_CREAM | Freq: Two times a day (BID) | CUTANEOUS | Status: DC

## 2010-05-30 MED ORDER — ALPRAZOLAM 1 MG PO TABS: 1.0000 mg | ORAL_TABLET | Freq: Four times a day (QID) | ORAL | Status: DC | PRN

## 2010-05-30 NOTE — Assessment & Plan Note (Signed)
Mild elevation today, likely situational - Continue all  medications as before, f/u any worsening symptoms, to check BP at home and next visit

## 2010-05-30 NOTE — Assessment & Plan Note (Signed)
High suspicion for right rotater cuff tear, but pt with AICD;  Will defer MRI for now, refer ortho, Continue all other medications as before

## 2010-05-30 NOTE — Patient Instructions (Addendum)
Take all new medications as prescribed - the januvia 50 mg per day Continue all other medications as before, except stop the premarin You will be contacted regarding the referral for: orthopedic - Dr Prince Rome You are given the refills today Please return in 6 mo with labs done 3-5 days prior

## 2010-05-30 NOTE — Assessment & Plan Note (Signed)
Not controlled  overall by hx and exam, ok to continue meds/tx and add the januvia 50 mg, Pt to cont other meds, diet and activity, wt control efforts, and check labs repeat next visit  Lab Results  Component Value Date   HGBA1C 8.9* 05/27/2010

## 2010-05-30 NOTE — Assessment & Plan Note (Signed)
D/w pt and she agress to attempt trial off med and risk of hot flashes discusses, though likely very little risk at this time, and likely small next to increased risk of stroke orother with ongoing premarin use
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
## 2010-06-03 ENCOUNTER — Ambulatory Visit: Payer: Self-pay | Admitting: Internal Medicine

## 2010-06-05 ENCOUNTER — Telehealth: Payer: Self-pay | Admitting: Internal Medicine

## 2010-06-05 NOTE — Telephone Encounter (Signed)
PA requested for Januvia 50mg  Tab PA Form requested from [employer] @1 [phone] PA form received & forwarded to Dr Jonny Ruiz for completion

## 2010-06-12 LAB — BASIC METABOLIC PANEL
Calcium: 8.4 mg/dL (ref 8.4–10.5)
Creatinine, Ser: 0.53 mg/dL (ref 0.4–1.2)
GFR calc non Af Amer: >60 mL/min (ref >60)
Glucose, Bld: 128 mg/dL — ABNORMAL HIGH (ref 70–99)
Sodium: 135 mEq/L (ref 135–145)

## 2010-06-12 LAB — GLUCOSE, CAPILLARY

## 2010-06-12 NOTE — Telephone Encounter (Signed)
Called Pt and informed her of the status of PA for Januvia [awaiting response from [employer]] Explained PA process: pharmacy, insurance [for form], form to MD [for completion], form to insurance [for response], response to pharmacy and/or MD.

## 2010-06-12 NOTE — Telephone Encounter (Signed)
Completed PA form received from MD on 06/11/18. Completed PA form faxed to [facility]. Awaiting decision [for Approval or Denial].

## 2010-06-13 LAB — POCT I-STAT, CHEM 8
BUN: 9 mg/dL (ref 6–23)
Calcium, Ion: 1.13 mmol/L (ref 1.12–1.32)
Chloride: 105 mEq/L (ref 96–112)
Creatinine, Ser: 0.6 mg/dL (ref 0.4–1.2)
Glucose, Bld: 91 mg/dL (ref 70–99)
HCT: 44 % (ref 36.0–46.0)
Hemoglobin: 15 g/dL (ref 12.0–15.0)
Potassium: 3.7 mEq/L (ref 3.5–5.1)
Sodium: 138 meq/L (ref 135–145)
TCO2: 23 mmol/L (ref 0–100)

## 2010-06-13 LAB — URINALYSIS, ROUTINE W REFLEX MICROSCOPIC
Bilirubin Urine: NEGATIVE
Glucose, UA: NEGATIVE mg/dL
Ketones, ur: NEGATIVE mg/dL
Nitrite: NEGATIVE
Specific Gravity, Urine: 1.009 (ref 1.005–1.030)
pH: 6 (ref 5.0–8.0)

## 2010-06-13 LAB — DIFFERENTIAL
Basophils Absolute: 0.1 10*3/uL (ref 0.0–0.1)
Eosinophils Relative: 2 % (ref 0–5)
Lymphocytes Relative: 38 % (ref 12–46)
Lymphs Abs: 3.6 10*3/uL (ref 0.7–4.0)
Monocytes Absolute: 0.7 10*3/uL (ref 0.1–1.0)
Monocytes Relative: 8 % (ref 3–12)
Neutro Abs: 5 10*3/uL (ref 1.7–7.7)

## 2010-06-13 LAB — GLUCOSE, CAPILLARY
Glucose-Capillary: 94 mg/dL (ref 70–99)
Glucose-Capillary: 96 mg/dL (ref 70–99)

## 2010-06-13 LAB — CK TOTAL AND CKMB (NOT AT ARMC): Total CK: 37 U/L (ref 7–177)

## 2010-06-13 LAB — CBC
Hemoglobin: 14.6 g/dL (ref 12.0–15.0)
MCHC: 34.2 g/dL (ref 30.0–36.0)
MCV: 97.9 fL (ref 78.0–100.0)
RDW: 11.9 % (ref 11.5–15.5)

## 2010-06-16 ENCOUNTER — Ambulatory Visit: Payer: Medicare PPO | Attending: Internal Medicine | Admitting: Physical Therapy

## 2010-06-16 DIAGNOSIS — M2560 Stiffness of unspecified joint, not elsewhere classified: Secondary | ICD-10-CM | POA: Insufficient documentation

## 2010-06-16 DIAGNOSIS — M25619 Stiffness of unspecified shoulder, not elsewhere classified: Secondary | ICD-10-CM | POA: Insufficient documentation

## 2010-06-16 DIAGNOSIS — IMO0001 Reserved for inherently not codable concepts without codable children: Secondary | ICD-10-CM | POA: Insufficient documentation

## 2010-06-16 DIAGNOSIS — M6281 Muscle weakness (generalized): Secondary | ICD-10-CM | POA: Insufficient documentation

## 2010-06-16 DIAGNOSIS — M546 Pain in thoracic spine: Secondary | ICD-10-CM | POA: Insufficient documentation

## 2010-06-18 NOTE — Telephone Encounter (Signed)
Approval received & faxed to pharmacy 06/16/10. LMOM to inform Pt.

## 2010-06-19 ENCOUNTER — Ambulatory Visit: Payer: Medicare PPO | Admitting: Physical Therapy

## 2010-06-23 ENCOUNTER — Encounter: Payer: Medicare PPO | Admitting: Physical Therapy

## 2010-06-26 ENCOUNTER — Ambulatory Visit: Payer: Medicare PPO | Admitting: Physical Therapy

## 2010-06-30 ENCOUNTER — Ambulatory Visit: Payer: Medicare PPO | Admitting: Physical Therapy

## 2010-07-03 ENCOUNTER — Ambulatory Visit: Payer: Medicare PPO | Admitting: Physical Therapy

## 2010-07-04 ENCOUNTER — Ambulatory Visit (INDEPENDENT_AMBULATORY_CARE_PROVIDER_SITE_OTHER): Payer: Medicare PPO | Admitting: Internal Medicine

## 2010-07-04 ENCOUNTER — Encounter: Payer: Self-pay | Admitting: Internal Medicine

## 2010-07-04 VITALS — BP 136/80 | HR 93 | Temp 98.2°F | Ht 60.0 in | Wt 156.1 lb

## 2010-07-04 DIAGNOSIS — E119 Type 2 diabetes mellitus without complications: Secondary | ICD-10-CM

## 2010-07-04 DIAGNOSIS — I10 Essential (primary) hypertension: Secondary | ICD-10-CM

## 2010-07-04 DIAGNOSIS — R42 Dizziness and giddiness: Secondary | ICD-10-CM

## 2010-07-04 DIAGNOSIS — J309 Allergic rhinitis, unspecified: Secondary | ICD-10-CM | POA: Insufficient documentation

## 2010-07-04 MED ORDER — MECLIZINE HCL 12.5 MG PO TABS: 12.5000 mg | ORAL_TABLET | Freq: Three times a day (TID) | ORAL | Status: DC | PRN

## 2010-07-04 MED ORDER — FEXOFENADINE HCL 180 MG PO TABS: 180.0000 mg | ORAL_TABLET | Freq: Every day | ORAL | Status: DC

## 2010-07-04 NOTE — Assessment & Plan Note (Addendum)
C/w vertigo likely related to eustachian dysfunction, and allergies, but will also check carotid dopplers as well given the hx of CV disease, also for meclizine prn,  to f/u any worsening symptoms or concerns
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
## 2010-07-04 NOTE — Assessment & Plan Note (Signed)
Mild to mod, to add the allegra, Continue all other medications as before,  to f/u any worsening symptoms or concerns

## 2010-07-04 NOTE — Assessment & Plan Note (Signed)
Improved, tolerating the [place] well, Continue all other medications as before, f/u with labs next visit - declines further labs today

## 2010-07-04 NOTE — Patient Instructions (Addendum)
Take all new medications as prescribed - the meclizine for dizziness due to inner ear, and allegra for allergies Continue all other medications as before You will be contacted regarding the referral for: carotid dopplers (carotid artery ultrasound to check for blockage) Please return as planned at your last visit, or sooner if needed

## 2010-07-05 ENCOUNTER — Encounter: Payer: Self-pay | Admitting: Internal Medicine

## 2010-07-05 NOTE — Assessment & Plan Note (Signed)
stable overall by hx and exam, most recent lab reviewed with pt, and pt to continue medical treatment as before  BP Readings from Last 3 Encounters:  07/04/10 136/80  05/30/10 152/82  04/08/10 130/82

## 2010-07-07 ENCOUNTER — Encounter: Payer: Medicare PPO | Admitting: Physical Therapy

## 2010-07-10 ENCOUNTER — Encounter: Payer: Medicare PPO | Admitting: Physical Therapy

## 2010-07-11 ENCOUNTER — Encounter: Payer: Medicare PPO | Admitting: Cardiology

## 2010-07-21 ENCOUNTER — Encounter: Payer: Medicare PPO | Admitting: Cardiology

## 2010-07-21 ENCOUNTER — Other Ambulatory Visit: Payer: Self-pay | Admitting: Cardiology

## 2010-07-21 DIAGNOSIS — R42 Dizziness and giddiness: Secondary | ICD-10-CM
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
## 2010-07-22 NOTE — Cardiovascular Report (Signed)
NAME:  Carolyn [name], STROHM NO.:  [id number]   MEDICAL RECORD NO.:  [id number]          PATIENT TYPE:  OIB   LOCATION:  1961                         FACILITY:  [facility]   PHYSICIAN:  Verne Carrow, MDDATE OF BIRTH:  Aug 25, 1940   DATE OF PROCEDURE:  01/10/2008  DATE OF DISCHARGE:  01/10/2008                            CARDIAC CATHETERIZATION   PROCEDURES PERFORMED:  1. Left heart catheterization.  2. Selective coronary angiography.  3. Measurement of left ventricular pressures.   OPERATOR:  Verne Carrow, MD   INDICATIONS:  This is a 70 year old Caucasian female who was found to  have a left bundle-branch block on her EKG.  This led to a subsequent  nuclear perfusion study that showed hypokinesis of the anterior and  septal wall.  The patient's overall ejection fraction has been estimated  to be around 30% by echocardiogram as well as the nuclear study.   DETAILS OF PROCEDURE:  The patient was brought into the Outpatient  Cardiac Catheterization Laboratory after signing informed consent for  the procedure.  The right groin was prepped and draped in a sterile  fashion.  A 4-French sheath was inserted into the right femoral artery.  A JL-4 diagnostic catheter was used to selectively inject the left  coronary system.  A 3DRC catheter was used to selectively engage the  right coronary artery.  A 4-French pigtail catheter was used to cross  the aortic valve into the left ventricle.  A left ventricular angiogram  was not performed secondary to elevated end-diastolic pressures.  The  pigtail catheter was then pullback across the aortic valve with no  significant pressure gradient measured.  The patient tolerated the  procedure well, was taken to the recovery area in stable condition.  A 4-  French sheath will be removed in the holding area with manual pressure  applied for hemostasis.   FINDINGS:  1. Left main coronary artery bifurcates into the LAD and the     circumflex and has no evidence of disease.  2. The left anterior descending is a large vessel that courses to the      apex.  This gives off a large diagonal branch and a smaller second      diagonal branch.  There is no angiographic evidence of disease in      the LAD or the diagonal branches.  There are several small septal      branches that are free of disease.  3. Circumflex artery gives off a moderate-sized ramus intermedius that      is free of disease.  There are 2 small obtuse marginal branches      followed by a large posterolateral branch that is free of disease.      The AV groove circumflex branch is a small-caliber vessel that is      free of disease.  4. The right coronary artery is a codominant system that gives off a      small PDA branch and has no evidence of disease.  5. Hemodynamic data: Central aortic pressure 158/80, left ventricular  pressure 156/29, end-diastolic pressure 33.   IMPRESSION:  1. Nonischemic cardiomyopathy.  2. No angiographic evidence of coronary artery disease.  3. Elevated left ventricular filling pressures.  4. No assessment of left ventricular function secondary to elevated      end-diastolic pressure.   RECOMMENDATIONS:  I  recommend continuing this patient's aspirin, beta-  blocker, ACE inhibitor, and statin therapy.  I will actually titrate up  her ACE inhibitor given her elevated pressures.  I would also like to  begin diuresis and we will add Lasix 20 mg once daily.  I have  encouraged the patient to continue to try and stop smoking.  I will see  her in the office in 2-3 weeks, at which time we will check a metabolic  profile since we are starting the Lasix.      Verne Carrow, MD  Electronically Signed     CM/MEDQ  D:  01/10/2008  T:  01/10/2008  Job:  [id number]

## 2010-07-22 NOTE — Procedures (Signed)
NAME:  Carolyn [name], Carolyn [name] NO.:  [id number]   MEDICAL RECORD NO.:  [id number]          PATIENT TYPE:  OUT   LOCATION:  SLEEP CENTER                 [facility]AC[facility]   PHYSICIAN:  Barbaraann Share, MD,FCCPDATE OF BIRTH:  05[birth year]   DATE OF STUDY:  10/03/2007                            NOCTURNAL POLYSOMNOGRAM   REFERRING PHYSICIAN:   LOCATION:  Sleep lab.   REFERRING PHYSICIAN:  Barbaraann Share, MD,FCCP.   INDICATION FOR STUDY:  Hypersomnia with sleep apnea.   EPWORTH SLEEPINESS SCORE:  14.   SLEEP ARCHITECTURE:  The patient had a total sleep time of 251 minutes  with no slow wave sleep and only 23 minutes of REM.  Sleep onset latency  was prolonged at 36 minutes, and REM onset was prolonged as well at 135  minutes.  Sleep efficiency was moderately decreased at 72%.   RESPIRATORY DATA:  The patient was found to have one hypopnea and no  apneas for an apnea-hypopnea index of 0.2 events per hour.  There was  mild-to-moderate snoring noted.   OXYGEN DATA:  There was transient O2 desaturation as low as 88%.   CARDIAC DATA:  Rare PAC but no clinically significant arrhythmia noted.   MOVEMENT-PARASOMNIA:  Patient was found to have no significant leg jerks  or abnormal behaviors.   IMPRESSIONS-RECOMMENDATIONS:  1. Small numbers of obstructive events which do not meet the apnea-      hypopnea index criteria for the obstructive sleep apnea syndrome.      Perhaps the patient's daytime sleepiness is related to her sedating      medications.  2. Transient oxygen desaturation as low as 88%.  This is not      clinically significant.  3. Rare premature atrial contractions with no significant cardiac      arrhythmias noted.      Barbaraann Share, MD,FCCP  Diplomate, American Board of Sleep  Medicine  Electronically Signed     [facility]/MEDQ  D:  10/18/2007 17:05:39  T:  10/18/2007 20:30:03  Job:  [id number]
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
## 2010-07-22 NOTE — Assessment & Plan Note (Signed)
[facility]                            CARDIOLOGY OFFICE NOTE   Carolyn [name], Carolyn [name]                        MRN:          [id number]  DATE:01/03/2008                            DOB:          [birth year]    PRIMARY CARE PHYSICIAN:  Corwin Levins, MD   HISTORY OF PRESENT ILLNESS:  Carolyn [name] is a pleasant 70 year old  Caucasian female with a past medical history significant for diabetes  mellitus, hyperlipidemia, ongoing tobacco abuse, hypothyroidism, and  gastroesophageal reflux disease who presents today to our cardiology  office for further assessment of left bundle-branch block as well as an  abnormal stress test.  The patient tells me that she has been in her  normal state of good health, however, secondary to some complaints of  dizziness and lower extremity edema, she underwent an EKG that showed a  left bundle-branch block.  At this point, a myocardial perfusion stress  test was ordered that demonstrated abnormal systolic dysfunction with  wall motion abnormalities of the left ventricle.  This is reviewed in  detail below.  The patient also underwent an echocardiogram, which  demonstrated segmental left ventricular dysfunction.  She comes in our  office today and tells me that she has had no prior history of chest  pain or known cardiac disease.  She also denies any shortness of breath,  palpitations, diaphoresis, nausea, vomiting, syncopal episodes,  orthopnea, or PND.  She has noticed over the last several weeks a mild  swelling in her bilateral ankles and feet.  She has no other complaints  at this time.   PAST MEDICAL HISTORY:  1. Hyperlipidemia.  2. Diabetes mellitus.  3. Ongoing tobacco abuse.  4. Gastroesophageal reflux disease.  5. Hypothyroidism.  6. Anxiety/depression.  7. No known history of coronary artery disease.   PAST SURGICAL HISTORY:  1. Left nephrectomy secondary to nephrolithiasis.  2. Placement of a ureteral stent.  3.  Partial thyroidectomy.  4. Hysterectomy and bilateral ovary removal.  5. Liver biopsy.   ALLERGIES:  SULFA DRUGS, KEFLEX, FIORINAL, LEVAQUIN, DEPAKOTE, DILAUDID,  ZOCOR.   CURRENT MEDICATIONS:  1. Premarin 1.25 mg once daily.  2. Synthroid 75 mcg once daily.  3. Lipitor 40 mg once daily.  4. Omeprazole 40 mg once daily.  5. Enteric-coated aspirin 81 mg once daily.  6. Metformin 500 mg once daily.  7. Venlafaxine 75 mg 3 times daily.   SOCIAL HISTORY:  The patient is employed part time as a Scientist, research (medical).  She is divorced and has 5 children.  She has smoked 1 pack of cigarettes  per day for the last 40 years and continues to smoke currently.  She  denies use of alcohol or illicit drugs.   FAMILY HISTORY:  There is a family history of coronary artery disease in  the patient's mother and father.   REVIEW OF SYSTEMS:  As stated in the history of present illness and is  otherwise negative.   PHYSICAL EXAMINATION:  VITAL SIGNS:  Blood pressure 132/83, pulse 79 and  regular, respirations 12 and unlabored.  GENERAL:  This  is a pleasant middle-aged Caucasian female in no acute  distress.  Alert and oriented x3.  PSYCHIATRIC:  Mood and affect are appropriate.  MUSCULOSKELETAL:  Muscle strength and tone are normal.  SKIN:  Warm and dry.  NEUROLOGICAL:  No focal neurological deficits.  NECK:  No JVD.  No carotid bruits.  No lymphadenopathy.  No thyromegaly.  LUNGS:  There is decreased air movement bilaterally; however, there are  no wheezes, rhonchi, or crackles noted.  CARDIOVASCULAR:  Regular rate and rhythm without murmurs, gallops, or  rubs noted.  ABDOMEN:  Soft, nontender.  Bowel sounds are present.  EXTREMITIES:  No evidence of edema currently.  Pulses are 1 to 2+ in the  bilateral dorsalis pedis, posterior tibial arteries.  Radial pulses are  2+ bilaterally.   DIAGNOSTIC STUDIES:  1. A 12-lead electrocardiogram obtained in our office today shows left      bundle-branch block  with normal sinus rhythm.  Ventricular rate of      79 beats per minute.  2. Myocardial perfusion stress study performed on December 19, 2007,      shows that the patient was given adenosine for the stress portion      of her test.  There was abnormal wall motion and thickening with      akinesis of the distal anterior wall septum and apex.  There was      also evidence of left ventricular enlargement.  3. Surface echocardiogram performed on December 19, 2007, shows normal      left ventricular size with severe systolic dysfunction with an      ejection fraction estimated 30%.  There are regional wall motion      abnormalities with akinesis of the anteroseptum and inferoseptum.      The anterior and inferior walls were severely hypokinetic.  The      lateral walls are best preserved.  These wall motion abnormalities      are not explained by a left bundle-branch block alone.  Overall,      left ventricular systolic function was reduced with an ejection      fraction of 30%.  Left ventricular wall thickness was at upper      limits of normal.  Doppler parameters were consistent with abnormal      left ventricular relaxation.  There was mild mitral valvular      regurgitation.  Left atrium was mildly dilated.  Right ventricular      size was normal.  Right ventricular systolic function was normal.      There was no other significant valvular disease noted.   ASSESSMENT AND PLAN:  This is a pleasant 70 year old Caucasian female  with multiple risk factors for coronary artery disease including tobacco  abuse, hyperlipidemia, diabetes mellitus, and a family history of  coronary artery disease who presents with segmental left ventricular  dysfunction as well as a left bundle-branch block.  I think that the  most likely explanation for the patient's segmental left ventricular  dysfunction is coronary artery disease.  I have discussed this with her  and have plan to perform a diagnostic left heart  catheterization in the  Outpatient Cath Lab at [facility] within the next week.  I will  start the patient on a low-dose beta-blocker and a low-dose ACE  inhibitor today.  She is to continue taking her baby aspirin on a daily  basis.  I will make no other medication changes at the current time.  She is to alert [place] should she have any change in her clinical course  prior to her heart catheterization.  We will obtain all the appropriate  lab work prior to her heart catheterization.  I will plan on seeing her  back in the office after we had performed the heart catheterization.     Verne Carrow, MD  Electronically Signed    CM/MedQ  DD: 01/03/2008  DT: 01/04/2008  Job #: [id number]   cc:   Corwin Levins, MD

## 2010-07-22 NOTE — Assessment & Plan Note (Signed)
[facility]                            CARDIOLOGY OFFICE NOTE   Carolyn, [name]                        MRN:          [id number]  DATE:01/25/2008                            DOB:          Sep 14, 1940    PRIMARY CARE PHYSICIAN:  Carolyn Levins, MD   HISTORY OF PRESENT ILLNESS:  [name] is a pleasant 70 year old  Caucasian female with a past medical history significant for diabetes  mellitus, hyperlipidemia, ongoing tobacco abuse, hypothyroidism,  gastroesophageal reflux disease, and a recent diagnosis of a nonischemic  cardiomyopathy, who was initially seen in my office on January 03, 2008,  for further evaluation of left bundle-branch block as well as segmental  left ventricular dysfunction.  She told me at that time that she was in  a normal state of good health.  However, had some dizziness or lower  extremity edema and underwent an EKG that showed a left bundle-branch  block.  At that point, a myocardial perfusion stress test was ordered  that demonstrated abnormal systolic dysfunction with wall motion  abnormalities of the left ventricle.  The patient underwent an  echocardiogram that demonstrated segmental left ventricular dysfunction.  There were regional wall motion abnormalities with akinesis of the  anteroseptum and inferoseptal.  Anterior and inferior walls were  severely hypokinetic.  The lateral walls were the best preserved.  Ejection fraction was estimated at 30% by the echocardiogram and 32% by  the nuclear study.  Based on these findings, I elected to proceed with a  diagnostic left heart catheterization to rule out severe obstructive  coronary artery disease.  Her heart catheterization was performed on  January 10, 2008.  The coronary arteries were found to be normal with no  evidence of disease.  I did not perform a left ventricular angiogram  secondary to her elevated end-diastolic pressure of 33.  The patient was  started on an  ACE inhibitor as well as Lasix.  She comes in today for a  followup from her heart catheterization.   She tells me that she is doing well.  She denies any shortness of breath  or chest pain with exertion.  She also denies palpitations, diaphoresis,  nausea, vomiting, dizziness, near syncope, syncope, orthopnea, PND, or  lower extremity edema.  She does occasionally noticed that her left  ankle will be mildly swollen at the end of a long down on her feet.  This does resolve while laying down at night.  She has no other  complaints today.  She tells me that she continues to smoke three-  fourths of a pack of cigarettes per day.   PAST MEDICAL HISTORY:  As detailed above.   CURRENT MEDICATIONS:  1. Premarin 1.25 mg once daily.  2. Levothyroxine 75 mcg once daily.  3. Lipitor 40 mg once daily.  4. Enteric-coated aspirin 81 mg once daily.  5. Omeprazole 20 mg once daily.  6. Lisinopril 10 mg once daily.  7. Metoprolol 25 mg twice daily.  8. Glimepiride 1 mg once daily.  9. Lasix 20 mg once daily.   REVIEW  OF SYSTEMS:  As stated in the history of present illness and is  otherwise negative.   PHYSICAL EXAMINATION:  VITALS:  Blood pressure 130/78, pulse 72 and  regular, respirations 12 and unlabored.  GENERAL:  She is a middle-aged  Caucasian female in no acute distress.  She is alert and oriented x3.  PSYCHIATRIC:  Mood and affect are normal.  MUSCULOSKELETAL:  Muscle strength and tone is normal.  NEUROLOGICAL:  No focal neurological deficits.  SKIN:  Warm and dry.  HEENT:  Normal.  NECK:  No JVD.  No carotid bruits.  No thyromegaly.  No lymphadenopathy.  LUNGS:  Clear to auscultation bilaterally with decreased air movement  bilaterally.  CARDIOVASCULAR:  Regular rate and rhythm without murmurs,  gallops, or rubs noted.  ABDOMEN:  Soft and nontender.  Bowel sounds are present.  EXTREMITIES:  No evidence of edema.  All extremities are warm to touch.   DIAGNOSTIC STUDIES:   Diagnostic left heart catheterization performed on  January 10, 2008, showed no angiographic evidence of coronary artery  disease.  The left ventricular pressure was 156/29 with an end-diastolic  pressure of 33.  Left ventricular angiogram was not performed.   ASSESSMENT AND PLAN:  This is a pleasant 70 year old Caucasian female,  who has recently undergone diagnostic left heart catheterization for  exclusion of coronary artery disease in the setting of a new diagnosis  of cardiomyopathy.  Her moderate-to-severe cardiomyopathy cannot be  explained by obstructive coronary artery disease.  At this point, it is  not clear what is caused her to have the moderate-to-severe reduction in  her left ventricular systolic function.  She is on a good medication  regimen, currently which includes a statin, aspirin, ACE inhibitor, and  a beta-blocker.  She has tolerated the Lasix and describes no shortness  of breath.  We will obtain a basic metabolic profile today to look at  her renal function, since she has been started on the Lasix.  I would  like to reassess her cardiac function with a cardiac MRI in 6 months.  I  will see her back in the office following the MRI to review this.  Her  ejection fraction remains decreased over the next 6 months.  We may  consider discussions of the ICD.  The patient is aware that she should  call our office, if she has any change in her symptoms over the next 6  months.  She will continue to follow up her primary care needs with Dr.  Jonny Ruiz.  She tells me that Dr. Jonny Ruiz closely manages her cholesterol level  and her diabetes mellitus.     Carolyn Carrow, MD  Electronically Signed    CM/MedQ  DD: 01/25/2008  DT: 01/26/2008  Job #: [id number]   cc:   Carolyn Levins, MD
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
## 2010-07-24 ENCOUNTER — Other Ambulatory Visit: Payer: Self-pay | Admitting: Internal Medicine

## 2010-07-24 ENCOUNTER — Ambulatory Visit (INDEPENDENT_AMBULATORY_CARE_PROVIDER_SITE_OTHER): Payer: Medicare PPO | Admitting: *Deleted

## 2010-07-24 DIAGNOSIS — I4891 Unspecified atrial fibrillation: Secondary | ICD-10-CM

## 2010-07-24 DIAGNOSIS — I428 Other cardiomyopathies: Secondary | ICD-10-CM

## 2010-07-25 ENCOUNTER — Telehealth: Payer: Self-pay | Admitting: Internal Medicine

## 2010-07-25 NOTE — Assessment & Plan Note (Signed)
MEDICAL RECORD NUMBER:  [id number]   Ms. Lastinger returns to the clinic today for follow-up evaluation. She reports  that overall she has had some medical problems. She did develop urinary  continence and had to be seen by Dr. Annabell Howells, her urologist. She was  eventually told that she had a kidney stone on the right side and underwent  a CAT scan and x-ray. She does have a prior history of a left nephrectomy  and so this was a significant problem involving the right side. She was  hospitalized in early September and underwent a urethral stent placement.  She subsequently apparently passed the stone.   Despite passing the stone she has had some increased pain of her right  abdomen, especially worse when she is eating. She has recently been told  that she may have gallbladder disease after a scan showed stones of her  gallbladder. She is due to be evaluated by a gallbladder surgeon but is also  scheduled to follow up with Dr. Annabell Howells, her urologist, December 10, 2004. She  does report that she gets good relief with the hydrocodone that she uses  approximately four times per day.   MEDICATIONS:  1.  Diazepam 5 mg t.i.d.  2.  Alprazolam 0.5 mg one to two tablets p.o. q.i.d. p.r.n.  3.  Amitriptyline 25 mg q.h.s. p.r.n.  4.  Hydrocodone 7.5/750 one tablet q.i.d. p.r.n.  5.  Premarin 1.25 mg daily.  6.  Oxytrol 3.9 mg patch q.72h.  7.  Tussionex one teaspoon b.i.d.  8.  Doxycycline p.r.n.   REVIEW OF SYSTEMS:  Positive for weight gain, night sweats, skin rash,  nausea, diarrhea, constipation, urinary retention, abdominal pain.   PHYSICAL EXAMINATION:  Reasonably well-appearing adult female in no acute  discomfort. Blood pressure 116/61 with a pulse of 94, respiratory rate 16,  and O2 saturation 96% on room air. She has 5-/5 strength throughout the  bilateral upper and lower extremities. Bulk and tone were normal and  reflexes were 2+ and symmetrical. She ambulates without any assistive   device.   IMPRESSION:  1.  Diffuse myofascial pain.  2.  Moderate cervical spondylosis.  3.  Mild lumbar spondylosis.   In the office today we did refill the patient's hydrocodone at 7.5/750 one  tablet p.o. q.i.d. p.r.n. - a total of 120 with no refills. We will plan on  seeing the patient in follow-up in approximately 2-3 months' time. She is  doing very well on the medication apart from recent problems involving her  urinary bladder, gallbladder, and kidney stone on the right side.           ______________________________  Ellwood Dense, M.D.     DC/MedQ  D:  12/04/2004 11:17:00  T:  12/04/2004 12:06:11  Job #:  [id number]

## 2010-07-25 NOTE — Assessment & Plan Note (Signed)
MEDICAL RECORD NUMBER:  [id number].   Ms. Haertel returns to clinic today for followup evaluation. She reports that  overall she is doing well on her Vicodin used approximately four tablets per  day. She gets reasonably good relief in terms of her low back pain. She does  report that she needs to have a dilatation of her esophagus due for March 11, 2004. She will need a refill on her Vicodin just prior to that surgery,  and we have asked her to call in for a refill in early January 2006. She is  basically drinking fluids and a soft diet at the present time in  anticipation of this esophageal procedure.   MEDICATIONS:  1.  Diazepam 5 mg 1 tablet t.i.d.  2.  Alprazolam 0.5 mg 1 to 2 tablets p.o. q.i.d. p.r.n.  3.  Amitriptyline 25 mg q.h.s. p.r.n.  4.  Hydrocodone 7.5/750 one tablet q.i.d. p.r.n.  5.  Premarin 1.25 mg q.d.  6.  Oxytrol 3.9 mg patch change q.72h.  7.  Tussionex 1 teaspoon b.i.d.  8.  Doxycycline p.r.n.   PHYSICAL EXAMINATION:  Well appearing adult female in no acute distress.  Blood pressure 131/67 with a pulse of 78, respiratory rate 16, and O2  saturation 96% on room air. She has 4+/5 strength throughout the bilateral  upper and lower extremities. Bulk and tone were normal, and reflexes were 2+  and symmetrical. She ambulates without any assistive device.   REVIEW OF SYSTEMS:  Positive for respiratory infection, wheezing, coughing,  depression, poor sleep, agitation, headaches, numbness, weakness, bladder  incontinence, diarrhea, reflexes, nausea, frequent urination.   IMPRESSION:  1.  Diffuse myofascial pain.  2.  Moderate cervical spondylosis.  3.  Mild lumbar spondylosis.   No refill on her medications were necessary in the office today. We will  plan on refilling those in early January 2006 just before her planned  procedure as noted above.       DC/MedQ  D:  02/25/2004 12:17:22  T:  02/26/2004 12:50:50  Job #:  [id number]

## 2010-07-25 NOTE — Discharge Summary (Signed)
NAME:  Carolyn [name], PENNOCK NO.:  [id number]   MEDICAL RECORD NO.:  [id number]                   PATIENT TYPE:  INP   LOCATION:  0443                                 FACILITY:  [facility]   PHYSICIAN:  Graylin Shiver, M.D.                DATE OF BIRTH:  1940-06-26   DATE OF ADMISSION:  07/03/2002  DATE OF DISCHARGE:  07/06/2002                                 DISCHARGE SUMMARY   REASON FOR ADMISSION:  The patient is a 70 year old female who was admitted  to the hospital because of jaundice and abdominal pain.  The patient was  first seen by me in the office a week prior to admission.  She was referred  because of recurring episodes of abdominal pain.  She had been to the [facility] Room on several occasions with epigastric abdominal pain.  The pain occurred spontaneously and radiated down to her mid abdomen and up  into her chest.  The pain would last until she went to the emergency room  and received an injection of pain medication.  It initially started May 31, 2002, after eating lunch.  She felt as though there was a brick in her  stomach after eating lunch.  She states that the pain is all the same and  would occur after eating.  She has lost 12 pounds since May 31, 2002.  Review of data from her prior ER visits revealed that she had an upper GI  which showed some irritability of the duodenal bulb.  This was a slight  deformity which may represent prior peptic ulcer disease without active  peptic ulceration.  A CT of the abdomen done June 02, 2002, showed a fatty  liver with some slight enlargement.  An abdominal ultrasound showed fatty  liver.  The gallbladder was normal.  The spleen was normal.  The abdominal  aorta normal.  Dilated bile ducts were not present.  A CBC and recent  chemistries prior to admission were normal.  Amylase and lipase were normal.  The patient was scheduled for elective outpatient evaluation after seeing me  in  the office but on the day of admission presented to her urologist because  her urine had turned dark the day before.  The urologist noted this urine  contained bilirubin and that the patient was jaundiced.  The patient and her  daughter noted that the jaundice started the day before.  Because of the  jaundice and continuing abdominal pain, she was admitted to the hospital.   It was noted from the history that the patient had been taking Depakote over  the past two months.   MEDICATIONS PRIOR TO ADMISSION:  Premarin, Xanax, Valium, Depakote,  Darvocet, Protonix.   ALLERGIES:  SULFA, KEFLEX, FIORINAL, LEVAQUIN, DILAUDID, ADHESIVE TAPE.   PAST MEDICAL HISTORY:  1. History of kidney stones.  2. History of hypoplastic  colon polyps.   PAST SURGICAL HISTORY:  1. Left nephrectomy.  2. Appendectomy.  3. Hysterectomy.   PHYSICAL EXAMINATION:  VITAL SIGNS:  On admission her vital signs were  stable.  She appeared anxious and was breathing quite rapidly, but her pulse  oximetry was 97%.  SKIN:  Revealed jaundice.  HEENT:  Other than scleral icterus was unremarkable.  HEART:  Regular.  With no murmurs, gallops, or rubs.  LUNGS:  Clear.  ABDOMEN:  Bowel sounds were normal.  It was soft.  There was some tenderness  in the epigastrium and right upper quadrant.  The liver was felt two  fingerbreadths below the right costal margin.  The spleen was not felt.  EXTREMITIES:  Showed no edema.   LABORATORIES, X-RAYS DURING HER STAY IN THE HOSPITAL:  She had laboratories  which bilirubin in the urine.  Her white blood cell counts were normal.  Hemoglobin and hematocrit on admission 15.4 and 44.2.  Liver enzymes showed  total bilirubin 10.7, alkaline phosphatase 167, AST 831, ALT 760.  Amylase  normal.  Glucose on admission 170.  Hepatitis profile negative for types A,  B, and C.  Follow-up liver enzymes on July 05, 2002:  Total bilirubin 9.7,  alkaline phosphatase 135, AST 722, ALT 671.   MR/CT  was performed which were unremarkable.  The gallbladder was  contracted.  Fatty infiltration of the liver noted.  No evidence of  gallstones or choledocholithiasis.  Nuclear medicine hepatobiliary scan with  ejection fraction was done for further evaluation of the gallbladder.  There  was no evidence of ductal dilatation.  There was delayed nuclide filling of  the gallbladder with somewhat inhomogeneous-appearing liver.  Felt that this  could be secondary to hepatocellular disease.   HOSPITAL COURSE:  The patient underwent diagnostic testing while here in the  hospital and was seen on a daily basis.  She did have some episodes of upper  abdominal discomfort, and she remained jaundiced but overall was getting  along better every day.  She was up walking the halls.  She was eating and  was never in any acute distress after I first saw her in the emergency room.  It was felt that her jaundice was of a hepatocellular origin and most likely  due to a combination of steatohepatitis and Depakote toxicity.  She was  discharged home on July 06, 2002.   FINAL DIAGNOSES:  1. Jaundice of the hepatocellular pattern.  2. Fatty liver.  3. Probable Depakote toxicity.   PLAN:  Discharge diet as tolerated.  She was instructed to absolutely avoid  Depakote.  She was to resume her home medications.  Be on activity as  tolerated, and to follow up in the office with me one week post discharge,  sooner if necessary.  Her laboratories will be reviewed as an outpatient,  and further tests will be done as felt necessary.  If she remains jaundiced  or LFTs are not coming down, I will order a liver biopsy and consider  referral to hepatologist.                                               Graylin Shiver, M.D.    SFG/MEDQ  D:  07/10/2002  T:  07/10/2002  Job:  [id number]

## 2010-07-25 NOTE — Assessment & Plan Note (Signed)
MEDICAL RECORD NUMBER:  [id number]   DATE OF BIRTH:  05-11-1940   DATE OF EVALUATION:  September 03, 2004   INTERVAL HISTORY:  [name] returns to clinic today for followup  evaluation.  I last saw her in this office Jul 21, 2004.  She had  subsequently called back and requested an MRI scan and we unfortunately had  scheduled that for her cervical spine.  She did not proceed with that as the  intention was to obtain an MRI scan of her low back.  In any event, we will  plan to set her up in the office today.  She is using her hydrocodone  7.5/750 uses generally two to six tablets per day.  She takes it only as  absolutely necessary.  She also requests a refill on Premarin.  She reports  that she is getting pressure from attorneys and insurance carriers about  settling her insurance case.  She would like to obtain the MRI scan to see  if there is anything that will need ongoing treatment in the future.   MEDICATIONS:  1.  Diazepam 5 mg one tablet t.i.d.  2.  Alprazolam 0.5 mg one to two tablets p.o. q.i.d. p.r.n.  3.  Amitriptyline 25 mg at bedtime p.r.n.  4.  Hydrocodone 7.5/750 one tablet q.i.d. p.r.n.  5.  Premarin 1.25 mg once daily.  6.  Oxytrol 3.9 mg patch q.72h.  7.  Tussionex one teaspoon b.i.d.  8.  Doxycycline p.r.n.   REVIEW OF SYSTEMS:  Positive for weight gain, night sweats, skin rash, high  blood pressure, nausea, diarrhea, constipation, urinary retention, abdominal  pain, coughing.   PHYSICAL EXAMINATION:  Well-appearing adult female in no acute discomfort.  Blood pressure 157/75 with a pulse of 82, respiratory rate 16 and O2  saturation 96% on room air.  She has 5-/5 strength throughout the bilateral  upper and lower extremities.  Bulk and tone were normal and reflexes were 2+  and symmetrical.   IMPRESSION:  1.  Diffuse myofascial pain.  2.  Moderate cervical spondylosis.  3.  Mild lumbar spondylosis.   In the office today, we did set the patient up for an  MRI scan of her lumbar  spine without contrast to rule out herniated nucleus pulposus or worsening  spondylosis.  We also refilled her hydrocodone.  I gave her a new  prescription for Premarin 1.25 mg once daily with two refills as she is  having trouble getting into her primary care physician.  We will plan on  seeing the patient in followup in approximately 1-2 months' time.       DC/MedQ  D:  09/03/2004 16:45:40  T:  09/03/2004 21:59:45  Job #:  [id number]

## 2010-07-25 NOTE — Assessment & Plan Note (Signed)
Ms. Gras returns to clinic today for follow-up evaluation.  She has  undergone bladder tack-up surgery with Dr. Annabell Howells March 03, 2005.  She  notes that she is able to empty better and voids less frequently during the  day.  She still has significant restrictions in terms of lifting and  movement.  Dr. Annabell Howells wants to see her back in follow-up in mid-April 2007.   The patient continues to get good relief with her hydrocodone use 7.5/750 mg  one tablet q.i.d. p.r.n.  She usually uses four per day.  She remains out of  work at the present time per Dr. Annabell Howells.  Will plan on seeing the patient in  follow-up in approximately three months' time.  She does need a refill on  her hydrocodone in the office today.   MEDICATIONS:  1.  Diazepam 5 mg t.i.d.  2.  Alprazolam 0.5 mg one to two tablets p.o. q.i.d. p.r.n.  3.  Amitriptyline 25 mg q.h.s. p.r.n.  4.  Hydrocodone 7.5/750 mg one tablet q.i.d. p.r.n.  5.  Premarin 0.125 mg daily.  6.  Oxytrol 3.9 mg patch q.72h.  7.  Tussionex one teaspoon b.i.d.  8.  Doxycycline p.r.n.   REVIEW OF SYSTEMS:  Noncontributory.   PHYSICAL EXAMINATION:  GENERAL:  A well-appearing, fit adult female in mild  to no acute discomfort.  VITAL SIGNS:  Blood pressure 134/55 with a pulse of 86, respiratory rate 16,  and O2 saturation 96% on room air.  MUSCULOSKELETAL/NEUROLOGIC:  She has 4+/5 strength throughout the bilateral  upper and lower extremities.  Bulk and tone were normal.  Reflexes 2+ and  symmetrical.   IMPRESSION:  1.  Diffuse myofascial pain.  2.  Moderate cervical spondylosis.  3.  Mild lumbar spondylosis.   In the office today we did refill her hydrocodone 7.5/750 mg one tablet  q.i.d. p.r.n. as of June 01, 2005.  She had used a small extra amount of  hydrocodone initially after her surgery secondary to the pain associated  with that surgery.  She has resumed her regular schedule, which is up to  four tablets per day.  Will plan on seeing her in  follow-up in approximately  three months' time.           ______________________________  Ellwood Dense, M.D.     DC/MedQ  D:  05/25/2005 10:33:27  T:  05/26/2005 16:10:96  Job #:  [id number]

## 2010-07-25 NOTE — Op Note (Signed)
NAME:  Carolyn [name], Carolyn [name]                 ACCOUNT NO.:  [id number]   MEDICAL RECORD NO.:  [id number]          PATIENT TYPE:  AMB   LOCATION:  DAY                          FACILITY:  [facility]   PHYSICIAN:  Excell Seltzer. Annabell Howells, M.D.    DATE OF BIRTH:  09-Nov-1940   DATE OF PROCEDURE:  03/03/2005  DATE OF DISCHARGE:                                 OPERATIVE REPORT   PROCEDURE:  Lynx sling.   PREOPERATIVE DIAGNOSIS:  Stress incontinence.   POSTOPERATIVE DIAGNOSIS:  Stress incontinence.   SURGEON:  Dr. Bjorn Pippin   ANESTHESIA:  General.   SPECIMEN:  None.   DRAINS:  Foley catheter and vaginal pack.   BLOOD LOSS:  Minimal.   COMPLICATIONS:  None.   INDICATIONS:  [name] is a 2 year old white female with stress urinary  incontinence, who is to undergo a sling.   FINDINGS AND PROCEDURE:  She was given p.o. Cipro.  She was taken to the  operating room where a general anesthetic was induced.  She was fitted with  PAS hose and placed in lithotomy position.  Her perineum and genitalia were  prepped with Betadine solution.  She was draped in the usual sterile  fashion.  Her suprapubic area had been shaved prior to prep.   A weighted vaginal retractor was placed as was a Foley catheter.  The  bladder was drained.  The anterior vaginal wall was infiltrated with  approximately 5 mL of 1% lidocaine with epinephrine, and a midline incision  was made over the mid urethral area.  Mucosa was elevated off the  pubourethral fascia laterally, approximately 2 cm on each side to allow  placement of a finger in the incision.   At this point, 2 stab wounds were made approximately 2 cm lateral to the  midline, 1 on each side just overlying the pubis.  The fat was spread to the  fascia.  The [place] needle was then passed on the right.  The tip was brought  down to the top the pubis, walked along the back the pubis, then guided in  the vaginal incision with a finger.  This was then repeated on the left.   Cystoscopy was then performed with the 70 and 12 degree lenses.  Examination  revealed no evidence of bladder wall injury or urethral injury.   The [place] mesh was then secured to the trocars and pulled up into the  abdominal incisions.   Cystoscopy was then repeated.  Once again, no evidence of bladder wall  injury or perforation was noted.   At this point, the sheaths were removed leaving the mesh in good position.  This was confirmed by re-placing the Foley and using a right-angle between  the Foley and the mesh to ensure appropriate lack of tension.   At this point, the vaginal wall was closed using a running locked 2-0 Vicryl  suture, and the abdominal ends of the mesh were trimmed, allowing them to  fall back in the subcutaneous tissues.   The abdominal incisions were clean, dried, and then secured with Dermabond.   A  2 inch Iodoform vaginal pack was placed.  The Foley catheter was placed to  straight drainage.  The patient taken down from lithotomy position.  Her  anesthetic was reversed.  She was removed to the recovery room in stable  condition, and there were no complications.      Excell Seltzer. Annabell Howells, M.D.  Electronically Signed     JJW/MEDQ  D:  03/03/2005  T:  03/03/2005  Job:  [id number]   cc:   Corwin Levins, M.D. [facility]  [address]  [place]  [place] [address]
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
## 2010-07-25 NOTE — Assessment & Plan Note (Signed)
[name] returns to clinic today for followup evaluation. She reports that  she is taking hydrocodone 7.5/750 approximately 2 to 4 tablets per day.  She  did use only 2 on Saturday but yesterday had substantially increased level  of pain and had to take approximately 6.  I have warned her against taking  that many on a daily basis secondary to the overdoses of Tylenol.  She  reports that she feels better today than she did yesterday, but she is not  really sure what brought on the severe leg pain.  The pain is present in her  thighs medially along with her low back.  There is no radiation below knee  level to speak of.  She reports that her legs do feel better when she props  them up.   The patient would like to have someone prescribe her benzodiazepines as she  has lost her primary care physician, Dr. Cindy Hazy.  Apparently their office  closed.  She continues to be followed by her psychiatrist, Dr. Evelene Croon.   MEDICATIONS:  1.  Diazepam 5 mg 1 tablet t.i.d.  2.  Alprazolam 0.5 mg 1 to 2 tablets four times a day p.r.n.  3.  Amitriptyline 25 mg q.h.s. p.r.n.  4.  Hydrocodone 7.5/750 one tablet p.o. four times a day.  5.  Premarin 1.25 mg daily.  6.  Oxytrol 3.9 mg patch, change q.74h., change q.72h.  7.  Tussionex 1 teaspoon b.i.d.  8.  Doxycycline p.r.n.   PHYSICAL EXAMINATION:  GENERAL:  Well-appearing, extremely gregarious, adult  female in mild acute discomfort.  VITAL SIGNS:  Blood pressure 129/69, pulse 82, respiratory rate 16, O2  saturation 97% on room air.  EXTREMITIES:  She has pain throughout her bilateral lower extremities with  manual muscle testing but has strength of at least 4+/5.  She ambulates  without any assistive device.  Lumbar range of motion was decreased in all  planes.   IMPRESSION:  1.  Diffuse myofascial pain.  2.  Moderate cervical spondylosis.  3.  Mild lumbar spondylosis.   In the clinic today, no refill on hydrocodone as necessary.  I did tell her  that I would prescribe the benzodiazepines but only the Alprazolam and not  the diazepam in addition.  We have decided on using the alprazolam 0.5 mg to  use 1 to 2 tablets four times a day p.r.n.  I have given her a total of 90  with 1 refill in the office today.  I will plan on seing her in followup in  approximately two to three months' time.      Ellwood Dense, M.D.   DC/MedQ  D:  11/19/2003 12:16:09  T:  11/19/2003 13:21:05  Job #:  [id number]

## 2010-07-25 NOTE — Op Note (Signed)
NAME:  Carolyn [name], Carolyn [name] NO.:  [id number]   MEDICAL RECORD NO.:  [id number]          PATIENT TYPE:  AMB   LOCATION:  ENDO                         FACILITY:  [facility]   PHYSICIAN:  Georgiana Spinner, M.D.    DATE OF BIRTH:  [birth year]   DATE OF PROCEDURE:  DATE OF DISCHARGE:                                 OPERATIVE REPORT   PROCEDURE:  Colonoscopy.   INDICATIONS:  Colon polyps.   ANESTHESIA:  Demerol 120, Versed 12 mg.   PROCEDURE:  With the patient mildly sedated, in the left lateral decubitus  position, Olympus videoscopic colonoscope was inserted in the rectum and  passed under direct vision to the cecum, identified by ileocecal valve and  appendiceal orifice.  From this point the colonoscope was slowly withdrawn,  taking circumferential views of colonic mucosa, stopping only in the rectum  where there was a small polyp seen, photographed and removed using hot  biopsy forceps technique setting of 20/200 blended current and this achieved  good eradication polyp.  The endoscope was placed in retroflexion to view  the anal canal from above, and internal hemorrhoids were seen and  photographed.  The endoscope was straightened and withdrawn.  The patient's  vital signs and pulse oximeter remained stable.  The patient tolerated  procedure well without apparent complications.   FINDINGS:  Internal hemorrhoids, polyp of rectum.  Await biopsy report.  The  patient will call me for results and follow-up with me as an outpatient.           ______________________________  Georgiana Spinner, M.D.     GMO/MEDQ  D:  08/10/2005  T:  08/10/2005  Job:  [id number]   cc:   Corwin Levins, M.D. [facility]  [address]  [place]  [place] [address]

## 2010-07-25 NOTE — Assessment & Plan Note (Signed)
HISTORY OF PRESENT ILLNESS:  Ms. Benedick returns to the clinic today for  follow up evaluation.  I last saw her in this office April 30, 2003.  At  that time, she had complaints of diffuse myofascial pain.  She had been  treated with hydrocodone and was taking that on a periodic basis with good  relief.  We have continued her hydrocodone at that point.  She has been  taking hydrocodone 7.5/750 one tablet p.o. q.i.d. p.r.n.  She generally  takes approximately two a day.  She did have some increased pain the weekend  of June 23, 2003, after she cleaned her kitchen for approximately 6 hours  continuously.  That caused increased pain over the next few days, and she  had to increase her use of Vicodin.  She also developed a recent bout of  pneumonia and was treated with a combination of Tussionex Elixir along with  Doxycycline.  She does report increased pain in her back when she has a  coughing spell.  She also uses some heat to her low back, and that is  helpful on a periodic basis.   MEDICATIONS:  1. Diazepam 5 mg one tablet t.i.d.  2. Alprazolam 2 tablets t.i.d.  3. Amitriptyline 25 mg 1 tablet q.h.s.  4. Hydrocodone 7.5/750 1 tablet p.o. q.i.d. p.r.n.  5. Premarin 1.25 mg daily.  6. Oxytrol 3.9 mg per day patch change q.72 hours.  7. Tussionex 1 teaspoon b.i.d.  8. Doxycycline (finished prescription).   PHYSICAL EXAMINATION:  VITAL SIGNS:  Blood pressure 146/65 with pulse 85,  respiratory rate 14, O2 saturation 96% on room air.  NEUROLOGICAL:  The patient has 5-/5 strength throughout the bilateral upper  and lower extremities.  Bulk and tone were normal and reflexes were 2+ and  symmetrical.  Sensation was intact to light touch throughout the bilateral  upper extremities.  She is complaining of increased back pain, but her back  as not tender to palpation.   IMPRESSION:  1. Diffuse myofascial pain.  2. Moderate cervical spondylosis.  3. Mild lumbar spondylosis.   PLAN:  In the  office today, I did refill her Vicodin as of July 04, 2003,  7.5/750 1 tablet p.o. q.i.d. p.r.n.  I have also given her a refill on her  Tussionex medication, and she should use 5 ml p.o. q.12h. p.r.n.  I have  given her a total of 100 ml on the prescription pad in the office today.   We will plan on seeing the patient in follow up in approximately two months  time.      Ellwood Dense, M.D.   DC/MedQ  D:  07/02/2003 12:00:24  T:  07/02/2003 12:49:19  Job #:  [id number]

## 2010-07-25 NOTE — Telephone Encounter (Signed)
Pt wants to know if her device transmitted correctly.Marland KitchenMarland Kitchen
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
## 2010-07-25 NOTE — Op Note (Signed)
NAME:  Carolyn [name], Carolyn [name] NO.:  [id number]   MEDICAL RECORD NO.:  [id number]          PATIENT TYPE:  AMB   LOCATION:  ENDO                         FACILITY:  [facility]   PHYSICIAN:  Graylin Shiver, M.D.   DATE OF BIRTH:  07/23/1940   DATE OF PROCEDURE:  03/11/2004  DATE OF DISCHARGE:                                 OPERATIVE REPORT   INDICATIONS:  Dysphagia.   HISTORY:  The patient is a 70 year old female with a complaint of dysphagia.  A barium swallow was initially done which showed mild tertiary contractions  in the esophagus.  There was a question of a single episode of penetration  and possible faint aspiration.  A modified barium swallow was then performed  which was normal.  The patient did swallow a barium tablet without  difficulty during her esophagogram.  Endoscopy is being done to further  evaluate.   Informed consent was obtained after explanation of the risks of bleeding,  infection and perforation.   PREMEDICATIONS:  1.  Fentanyl 100 mcg IV.  2.  Versed 10 mg IV.   PROCEDURE:  With the patient in the left lateral decubitus position, the  Olympus gastroscope was inserted into the oropharynx and passed into the  esophagus.  It was advanced down the esophagus then into the stomach and  into the duodenum.  The second portion of the bulb of the duodenum looked  normal.  The stomach looked normal in its entirety including the upper  fundus and cardia seen on retroflexion.  The esophagus looked normal in its  entirety.  She tolerated the procedure well without complications.   IMPRESSION:  Normal esophagogastroduodenoscopy.   COMMENTS:  There is nothing objective on this esophagogastroduodenoscopy to  explain the patient's dysphagia.  A modified barium swallow was normal.  The  only abnormality truly noted were some tertiary contractions in the  esophagus.       SFG/MEDQ  D:  03/11/2004  T:  03/11/2004  Job:  [id number]   cc:   Corwin Levins, M.D.  [facility]
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
## 2010-07-25 NOTE — H&P (Signed)
NAME:  Carolyn [name], Carolyn [name] NO.:  [id number]   MEDICAL RECORD NO.:  [id number]                   PATIENT TYPE:  INP   LOCATION:  0443                                 FACILITY:  [facility]   PHYSICIAN:  Graylin Shiver, M.D.                DATE OF BIRTH:  Jul 17, 1940   DATE OF ADMISSION:  07/03/2002  DATE OF DISCHARGE:                                HISTORY & PHYSICAL   CHIEF COMPLAINT:  Jaundice and abdominal pain.   HISTORY OF PRESENT ILLNESS:  The patient is a 70 year old female who was  first seen by me in the office last week.  She was referred because of  recurring episodes of abdominal pain.  She had been going to the [facility]  emergency room in the past and had been to the [facility] emergency room on  four occasions in the recent past due to epigastric abdominal pain.  The  pain occurred spontaneously and radiated down to her mid abdomen, then up  into her chest.  She also states that the pain would last until she received  an injection of pain medication in the emergency room.  The pain started on  March 24 after she had been eating lunch.  She states that after eating  lunch she felt like someone dropped a brick in her stomach and she has been  having trouble ever since with recurring episodes of abdominal pain.  It  always seems to occur after eating.  No specific foods would make it occur,  however.  She would occasionally get some nausea and vomiting.  She had lost  12 pounds since March 24.  Review of data from her recent ER visits reveal  that she did have an upper GI series showing irritability in the duodenal  bulb with some slight deformity which may represent prior peptic ulcer  disease without active peptic ulceration at the time of the procedure.  This  was on June 26, 2002. A CT of the abdomen and pelvis was done on June 02, 2002 showing fatty liver with some slight enlargement.  There was an  abdominal ultrasound also done which  showed a fatty liver.  The gallbladder  was within normal limits.  The spleen was normal.  The abdominal aorta was  normal.  No mention of dilated bile ducts was present.  A CBC on March 26  was normal and recent chemistries including liver enzymes, amylase and  lipase were all normal.  After being seen in the office the patient was  going to be scheduled for an EGD and a HIDA scan with ejection fraction to  further evaluate her episodes of abdominal pain.  She presented to her  urologist's office on the day of this admission with complaints that her  urine became a very dark color the day before.  The urologist called up and  stated  that the patient was jaundice and the patient was subsequently sent  to the emergency room where I saw her.  The patient and her daughter  reported that she had not been jaundiced until yesterday as far as they  noted and that her urine was not dark until yesterday either.  Because of  these findings, it was felt that the patient should be admitted for further  evaluation.   MEDICATIONS:  1. Premarin.  2. Xanax.  3. Valium.  4. Depakote.  Of note is that the patient started on Depakote two months     ago.  5. Darvocet.  6. Protonix.   ALLERGIES:  SULFA, KEFLEX, FIORINAL, LEVAQUIN, DILAUDID, ADHESIVE TAPE.   PAST MEDICAL HISTORY:  1. History of kidney stones.  2. History of hyperplastic colon polyp.   PAST SURGICAL HISTORY:  1. Left nephrectomy.  2. Appendectomy.  3. Hysterectomy.   FAMILY HISTORY:  Significant for heart attack in mother.  Father has a  history of heart attacks also.  There is no family history of colon cancer  or colon polyps.   SOCIAL HISTORY:  The patient smokes cigarettes.  She denies drinking  alcohol.   REVIEW OF SYMPTOMS:  GENERAL:  She was not complaining of fever, chills or  malaise.  She had not noticed any jaundice until yesterday as well as the  dark color of her stool.  EYES:  No complaints of visual problems.   NECK:  No complaints of masses.  CARDIOPULMONARY:  No complaints of anginal chest  pains, shortness of breath, cough or sputum production prior to her visit to  the ER where she was complaining of feeling short of breath which seems to  be from anxiety as her pulse oximetry is 97%.  GI REVIEW:  No dysphagia,  odynophagia, nausea or vomiting.  She has reported a recent poor appetite  and unexplained weight loss.  The jaundice is something new.  She has some  complaints of belching and bloating and gas in her abdomen.  She had some  constipation and diarrhea at times.   PHYSICAL EXAMINATION:  VITAL SIGNS:  Her vital signs are stable on  admission.  GENERAL:  She appears anxious and is breathing quite rapidly, but her pulse  oximetry is 97% and she is anxious.  SKIN:  Her skin reveals jaundice.  HEENT:  Head is normocephalic.  Eyes:  Scleral icterus is present.  Extraocular movements are intact.  The neck is supple with no masses,  adenopathy or goiter.  HEART:  Regular rhythm with no murmurs, rubs or gallops.  LUNGS:  Clear.  BACK:  No costovertebral angle pain or spinal tenderness.  ABDOMEN:  Bowel sounds are normal and soft.  There is some tenderness in the  epigastrium and right upper quadrant.  No masses are felt.  I do feel the  liver two fingerbreadths below the right costal margin. I do not feel the  spleen.  EXTREMITIES:  No edema.  NEUROLOGIC:  Intact.   LABORATORY DATA:  Elevation of bilirubin, SGOT and SGPT.   IMPRESSION:  Jaundice of unclear etiology.  The pattern appears to be more  of a hepatocellular picture rather than an obstructive pattern.  Considerations would be acute viral hepatitis.  Also of consideration would  be drug toxicity and of note is that the patient has been on Depakote over  the past two months which has a black box warning on the PDR for  hepatotoxicity. Other considerations, since she does  have pain, would be that she has steatohepatitis of the  painful nature and also, although  unlikely, she could have gallstones and I will therefore repeat an abdominal  ultrasound and obtain an MR CT just to make sure.   PLAN:  The patient is admitted to the hospital for further evaluation and  treatment.  Depakote will be held.  IV fluids and analgesics will be given  because of her pain.  A repeat gallbladder ultrasound will be obtained just  to make sure she does not have stones and also an MRCP to make sure she does  not have stones.  Also, a hepatitis profile will be obtained.                                               Graylin Shiver, M.D.   Germain Osgood  D:  07/04/2002  T:  07/04/2002  Job:  [id number]

## 2010-07-25 NOTE — Assessment & Plan Note (Signed)
Carolyn [name] returns to the clinic today for follow-up evaluation.  She reports  that she still has good and bad days.  She has had recent problems with her  esophagus with food getting lodged in her trachea.  She has undergone recent  scoping, but they have not found a cause for her problems.  She also has  seen her local ear, nose, and throat doctor, but no firm diagnosis has been  made.  She does report that her back pain has been worse and she feels it is  secondary to prolonged time on the examining table during these endoscopic  studies.  She reports that she has been taking her hydrocodone consistently  four times per day lately because of the increased back pain.  She would  like to be considered for a lumbar corset brace and we have set her up for  one through [employer].   MEDICATIONS:  1.  Diazepam 5 mg one tablet t.i.d.  2.  Alprazolam 0.5 mg one to two tablets p.o. q.i.d. p.r.n.  3.  Amitriptyline 25 mg q.h.s. p.r.n.  4.  Hydrocodone 7.5/750 one tablet q.i.d. p.r.n.  5.  Premarin 1.25 mg daily.  6.  Oxytrol 3.9 mg patch change q.72h.  7.  Tussionex one teaspoon b.i.d.  8.  Doxycycline p.r.n.   REVIEW OF SYSTEMS:  Positive for weight gain, easy bleeding, high and low  blood sugars, nausea, vomiting, diarrhea, constipation, urinary retention,  painful urination, abdominal pain, poor appetite, coughing.   IMPRESSION:  1.  Diffuse myofascial pain.  2.  Moderate cervical spondylosis.  3.  Mild lumbar spondylosis.   We have set the patient up for evaluation at [facility] for a lumbar corset  brace.  No refill on her medications is necessary in the office today as  Vicodin was recently filled on May 08, 2004.  She will be calling in for  refill at the end of March.  We will plan on seeing her in follow-up in  approximately two months' time with refills prior to that appointment as  necessary.      DC/MedQ  D:  05/26/2004 12:11:39  T:  05/26/2004 13:27:52  Job #:  [id number]

## 2010-07-25 NOTE — Telephone Encounter (Signed)
Transmission was received. Left message on machine.

## 2010-07-25 NOTE — Op Note (Signed)
NAME:  DEMECIA, Carolyn [name] NO.:  [id number]   MEDICAL RECORD NO.:  [id number]          PATIENT TYPE:  AMB   LOCATION:  ENDO                         [facility]ACILITY[facility]   PHYSICIAN:  Georgiana Spinner, M.D.    DATE OF BIRTH:  [birth year]   DATE OF PROCEDURE:  12/24/2004  DATE OF DISCHARGE:                                 OPERATIVE REPORT   PROCEDURE:  Upper endoscopy.   INDICATIONS:  Abdominal pain.   ANESTHESIA:  Demerol 70 mg, Versed 9 mg.   DESCRIPTION OF PROCEDURE:  With the patient mildly sedated in the left  lateral decubitus position, the Olympus videoscopic endoscope was inserted  in the mouth, passed under direct vision through the esophagus which  appeared normal into the stomach. The fundus, body, antrum, duodenal bulb,  second portion of duodenum appeared normal. From this point, the endoscope  was slowly withdrawn taking circumferential views of the duodenal mucosa  until the endoscope had been pulled back into the stomach, placed in  retroflexion to view the stomach from below. The endoscope was then  straightened and withdrawn taking circumferential views of the remaining  gastric and esophageal mucosa. The patient's vital signs and pulse oximeter  remained stable. The patient tolerated the procedure well without apparent  complications.   FINDINGS:  Mild erythema of distal esophagus, very, very slight erythema of  the antrum and body, again very slight, possibly related to the shading  produced by the endoscope. At any rate, we biopsied the stomach. Await  biopsy report. Will increase the patient's PPI dose to b.i.d. for the time  being. The patient states that her abdominal pain as described previously is  somewhat better than it was a few weeks ago but still present. Ultrasound  was negative for gallstones, so etiology of her pain to me is unclear, but  it appears to be postprandially and since it not biliary and does not appear  to be a significant upper  GI pathology an MR angiogram might be a  consideration and will plan on doing that to evaluate her situation further.           ______________________________  Georgiana Spinner, M.D.     GMO/MEDQ  D:  12/24/2004  T:  12/24/2004  Job:  [id number]   cc:   Excell Seltzer. Annabell Howells, M.D.  Fax: [phone]   Corwin Levins, M.D. [facility]  [address]  [place]  [place] [address]

## 2010-07-25 NOTE — Assessment & Plan Note (Signed)
HISTORY:  Carolyn [name] returns to the clinic today for a follow-up evaluation.  She reports that she has been doing poorly overall, especially this morning.  She almost skipped her appointment today.  She reports that she was doing  better yesterday and feels that the worsening pain may have been secondary  to the rainy weather that we had yesterday.  She is taking her hydrocodone  7.5/750 mg, one tab q.i.d.  She reports that this continues to help a lot.  She is reporting poor sleep, and for some reason is off of her amitriptyline  medication that she has used previously.  She continues to take Xanax 0.5 mg  q.i.d. p.r.n.  This is prescribed by Dr. Senaida Ores.   MEDICATIONS:  1.  Alprazolam 0.5 mg q.i.d. p.r.n.  2.  Hydrocodone 7.5/750 mg, one tab q.i.d. p.r.n.  3.  Premarin 1.25 mg daily.  4.  Oxytrol 3.9 mg patch, change q.72h.  5.  Tussionex one teaspoonful b.i.d.  6.  Doxycycline p.r.n.   REVIEW OF SYSTEMS:  Positive for weight gain, night sweats, nausea,  vomiting, diarrhea, abdominal pain, poor appetite, coughing and limb  swelling.   IMPRESSION:  1.  Diffuse myofascial pain.  2.  Moderate cervical spondylosis.  3.  Mild lumbar spondylosis.   PLAN:  In the office today we did restart the patient on amitriptyline 25  mg, one tab p.o. q.h.s. for her poor sleep.  She does not need a refill on  the hydrocodone, as that was recently filled on Jul 07, 2004.  Will plan on  seeing her in followup in approximately two to three months' time.      DC/MedQ  D:  07/21/2004 12:02:26  T:  07/21/2004 13:52:32  Job #:  [id number]

## 2010-07-25 NOTE — Assessment & Plan Note (Signed)
Ms. Burack returns to clinic today for follow-up evaluation.  She reports  that she gets a fair amount of relief from the hydrocodone used 3-5 times  per day.  She reports that she occasionally has very severe pain days and  those involve throbbing of her right hip and legs.  She reports that most  days are less severe.  She does use the hydrocodone on a daily basis.  She  has had adjustment in her medicines as noted below.   MEDICATIONS:  1.  Timed diazepam time release 3 mg daily.  2.  Amitriptyline 25 mg at bedtime.  3.  Hydrocodone 7.5/750 one tablet q.i.d. p.r.n.  4.  Premarin 0.125 mg daily.  5.  Tussionex 1 teaspoon b.i.d.  6.  Doxycycline p.r.n.   REVIEW OF SYSTEMS:  Noncontributory.   PHYSICAL EXAM:  Well-appearing, fit, adult, elderly female in mild acute  discomfort.  Blood pressure 157/82 with pulse of 81, respiratory rate 16 and  O2 saturation 98% on room air.  She has 4+/5 strength throughout the  bilateral upper extremities and 4-/5 strength throughout the bilateral lower  extremities.  Reflexes were 2+ and symmetrical and sensation was intact to  light touch throughout.  She ambulates without any assistive device.   IMPRESSION:  1.  Diffuse myofascial pain.  2.  Moderate cervical spondylosis.  3.  Mild lumbar spondylosis.   In the office today, we did refill the patient's hydrocodone, total of 120  were prescribed.  Will plan on seeing the patient in followup in this office  in approximately three months' time with refills prior to that appointment  as necessary.           ______________________________  Ellwood Dense, M.D.     DC/MedQ  D:  09/28/2005 09:19:55  T:  09/28/2005 09:28:20  Job #:  [id number]

## 2010-07-25 NOTE — Assessment & Plan Note (Signed)
MEDICAL RECORD NUMBER:  [id number].   Carolyn [name] returns to clinic today for followup evaluation. She has undergone  the emergency stenting for her right kidney secondary to a kidney stone back  in September. She is now scheduled for a sling bladder tack procedure with  Dr. Annabell Howells this coming Tuesday, March 03, 2005. She has been using some  extra hydrocodone secondary to increased pain that she has been  experiencing. She does need a refill on that in the office today.   MEDICATIONS:  1.  Diazepam 5 mg t.i.d.  2.  Alprazolam 0.5 mg 1 to 2 p.o. q.i.d. p.r.n.  3.  Amitriptyline 25 mg nightly p.r.n.  4.  Hydrocodone 7.5/750 one tablet q.i.d. p.r.n.  5.  Premarin 0.125 mg daily.  6.  Oxytrol 3.9 mg patch q.72h.  7.  Tussionex 1 teaspoon b.i.d.  8.  Doxycycline p.r.n.   REVIEW OF SYSTEMS:  Positive for diarrhea.   PHYSICAL EXAMINATION:  Well appearing, fit, adult female in mild acute  discomfort. Blood pressure 150/82 with a pulse of 80, respiratory rate 16  and O2 saturation 95% on room air. She has 4+ to 5-/5 strength throughout  the bilateral upper and lower extremities. Bulk and tone were normal, and  reflexes were 2+ and symmetrical.   IMPRESSION:  1.  Diffuse myofascial pain.  2.  Moderate cervical spondylosis.  3.  Mild lumbar spondylosis.   In the office today, we did refill the patient's hydrocodone 7.5/750 one  tablet p.o. q.i.d. p.r.n., a total of 120. She will be going in for surgery  and will probably have adequate pain relief through the in-hospital  physician. We will plan on seeing the patient in followup in approximately  three months' time with refill of medication prior to that appointment as  necessary.           ______________________________  Ellwood Dense, M.D.     DC/MedQ  D:  02/23/2005 10:17:06  T:  02/24/2005 11:41:37  Job #:  [id number]

## 2010-07-25 NOTE — Op Note (Signed)
NAME:  Carolyn [name], Carolyn [name]                 ACCOUNT NO.:  [id number]   MEDICAL RECORD NO.:  [id number]          PATIENT TYPE:  AMB   LOCATION:  DAY                          FACILITY:  [facility]   PHYSICIAN:  Excell Seltzer. Annabell Howells, M.D.    DATE OF BIRTH:  04/14/1940   DATE OF PROCEDURE:  11/15/2004  DATE OF DISCHARGE:                                 OPERATIVE REPORT   REDICTATION.   PROCEDURE:  Cystoscopy, bladder biopsy with fulguration, right retrograde  pyelogram, ureteroscopy, insertion of right double-J stent.   PREOPERATIVE DIAGNOSIS:  History of urolithiasis with possible right  proximal ureteral stone.   POSTOPERATIVE DIAGNOSIS:  History of urolithiasis with possible right  proximal ureteral stone. Bladder wall lesion.   SURGEON:  Dr. Bjorn Pippin.   ANESTHESIA:  General.   DRAIN:  6-French x 24 cm double-J stent.   SPECIMEN:  Bladder biopsy.   COMPLICATIONS:  None.   INDICATIONS:  [name] is a 70 year old white female with a history of  stones. She has had some chronic right flank pain and on CT was felt to have  a right proximal ureteral stone with mild obstruction.   FINDINGS AND PROCEDURE:  The patient was taken to the operating room, she  was given 400 milligrams of Cipro I.V. She was placed in lithotomy position  after induction of anesthetic. Her perineum and genitalia were prepped with  Betadine solution. She was prepped in the usual fashion with Betadine  solution and then draped in the usual sterile fashion. Cystoscopy was  performed using the 22-French scope and 12 and 70 degrees lenses.  Examination revealed a normal urethra. The bladder wall had mild  trabeculation. On the right posterior wall was an erythematous lesion of  uncertain etiology that measured approximately 3-4 mm. The ureteral orifices  were unremarkable.   A cup biopsy forceps was used to biopsy the lesion on the right posterior  wall, the biopsy site was then fulgurated.   A 5-French open-end catheter  was then used to perform a right retrograde  pyelogram, contrast was instilled, this revealed a normal distal ureter. The  area that the stent had been seen on CT scan was over the bone so it was not  well visualized. There did not appear to be significant dilation proximal to  this upper ureteral area. The intrarenal collecting system was otherwise  unremarkable.   I then passed a 6-French short ureteroscope. This revealed some narrowing of  the distal ureter but no obvious stones. I was not able to advance it to the  level of the proximal ureter.   A guidewire was then passed to the kidney and a 6-French, 24 cm double-J  stent was inserted without difficulty under fluoroscopic guidance. The wire  was removed leaving a good coil in the kidney, a good coil in the bladder.   The bladder was then drained, the patient was taken down from lithotomy  position, her anesthetic was reversed, she was moved to the recovery room in  stable condition and there were no complications.      Excell Seltzer.  Annabell Howells, M.D.  Electronically Signed     JJW/MEDQ  D:  11/24/2004  T:  11/24/2004  Job:  [id number]

## 2010-07-28 ENCOUNTER — Encounter: Payer: Self-pay | Admitting: *Deleted

## 2010-07-30 ENCOUNTER — Encounter (INDEPENDENT_AMBULATORY_CARE_PROVIDER_SITE_OTHER): Payer: Medicare PPO | Admitting: Cardiology

## 2010-07-30 DIAGNOSIS — R42 Dizziness and giddiness: Secondary | ICD-10-CM

## 2010-07-30 DIAGNOSIS — I6529 Occlusion and stenosis of unspecified carotid artery: Secondary | ICD-10-CM

## 2010-07-30 NOTE — Progress Notes (Signed)
icd remote check  

## 2010-08-01 ENCOUNTER — Telehealth: Payer: Self-pay

## 2010-08-01 ENCOUNTER — Encounter: Payer: Self-pay | Admitting: Internal Medicine

## 2010-08-01 DIAGNOSIS — E119 Type 2 diabetes mellitus without complications: Secondary | ICD-10-CM

## 2010-08-01 MED ORDER — SITAGLIPTIN PHOSPHATE 50 MG PO TABS: 100.0000 mg | ORAL_TABLET | Freq: Every day | ORAL | Status: DC

## 2010-08-01 NOTE — Telephone Encounter (Signed)
Pt advised.

## 2010-08-01 NOTE — Telephone Encounter (Signed)
I suspect this was the pharmacist who recommended this, and although techinically correct , in clinical practice this is not normally needed (stopping the DM pill with taking the cipro).  Ok to cont all medications, including the cipro

## 2010-08-01 NOTE — Telephone Encounter (Signed)
Ok to increase the [place] to 100 mg while on the cipro  New rx sent to pharmacy

## 2010-08-01 NOTE — Telephone Encounter (Signed)
Per pt she was advised of this by the [facility] MD. She was told to stop Amaryl for the 10 days she was on Cipro and monitor CBGs. Pt is still very concerned about this and does not wants to take both meds at the same time. Pt wants Dr Jonny Ruiz to advise on what she should do to control CBGS while off medication, CBGs have already increased to 265 this am (last glimepiride was yesterday morning). Pt's normal range is around 180

## 2010-08-01 NOTE — Telephone Encounter (Signed)
Pt called stating she has appt with [facility] MD yesterday that Rx'd Cipro. Pt was told that she will need to stop Glimeparide while on Cipro due to drug interaction but was not told what if anything to take in its place to help with blood sugars. Pt [place] requesting advisement from MD.

## 2010-08-05 ENCOUNTER — Other Ambulatory Visit: Payer: Self-pay | Admitting: Cardiovascular Disease

## 2010-08-18 ENCOUNTER — Other Ambulatory Visit: Payer: Self-pay

## 2010-08-18 ENCOUNTER — Encounter: Payer: Medicare PPO | Attending: Neurosurgery | Admitting: Neurosurgery

## 2010-08-18 DIAGNOSIS — M7500 Adhesive capsulitis of unspecified shoulder: Secondary | ICD-10-CM

## 2010-08-18 DIAGNOSIS — M545 Low back pain, unspecified: Secondary | ICD-10-CM | POA: Insufficient documentation

## 2010-08-18 DIAGNOSIS — M4716 Other spondylosis with myelopathy, lumbar region: Secondary | ICD-10-CM

## 2010-08-18 DIAGNOSIS — M25819 Other specified joint disorders, unspecified shoulder: Secondary | ICD-10-CM | POA: Insufficient documentation

## 2010-08-18 DIAGNOSIS — M47812 Spondylosis without myelopathy or radiculopathy, cervical region: Secondary | ICD-10-CM | POA: Insufficient documentation

## 2010-08-18 DIAGNOSIS — M25529 Pain in unspecified elbow: Secondary | ICD-10-CM

## 2010-08-18 DIAGNOSIS — M47817 Spondylosis without myelopathy or radiculopathy, lumbosacral region: Secondary | ICD-10-CM | POA: Insufficient documentation

## 2010-08-18 DIAGNOSIS — F17200 Nicotine dependence, unspecified, uncomplicated: Secondary | ICD-10-CM | POA: Insufficient documentation

## 2010-08-18 MED ORDER — ZOLPIDEM TARTRATE 10 MG PO TABS: 10.0000 mg | ORAL_TABLET | Freq: Every evening | ORAL | Status: DC | PRN

## 2010-08-18 NOTE — Telephone Encounter (Signed)
Faxed hardcopy to pharmacy. 

## 2010-08-19 NOTE — Assessment & Plan Note (Signed)
This is a 70 year old female with a history impingement syndrome in the shoulder.  She has no change in her status as far as her left and right shoulder and arm goes.  She has some low back pain with radiculopathy, more of a pain syndrome than true radiculopathy.  The patient states she has been treated most recently for kidney infections, had more trouble with that than anything.  She rates her current pain about 6 or 7, tingling and aching.  Activity level is moderate.  Pain is worse with most all activities.  Rest, heat, and medication tend to help.  Mobility, she walks without assistance, she walk about 10 minutes climb step and drive.  REVIEW OF SYSTEMS:  Notable for those difficulties as well as easy bleeding blood, sugar fluctuations, GI issues, edema, coughing, and shortness of breath.  SOCIAL HISTORY:  She is divorced.  She still smokes.  PAST MEDICAL HISTORY:  Unchanged.  FAMILY HISTORY:  Significant heart disease, diabetes, hypertension.  PHYSICAL EXAMINATION:  Blood pressure 139/76, pulse 80, respirations 18, O2 sats 91 on room air.  Motor strength is intact in the upper and lower extremities.  Her sensation is intact.  Constitutionally, she is tense, within normal limits.  Neurologically, she is alert and oriented x3. Her affect is bright.  Her gait is normal.  IMPRESSION: 1. Left shoulder impingement syndrome.  No rotator cuff involvement. 2. Lumbar and cervical spondylosis.  PLAN:  Refill her hydrocodone 7.5/325 one p.o. q.i.d., 120 with no refills.  We will see her back in 3 months.  Her questions were encouraged and answered.     Gibbs Naugle L. Blima Dessert Electronically Signed    RLW/MedQ D:  08/18/2010 13:58:54  T:  08/19/2010 04:54:27  Job #:  [id number]

## 2010-08-31 ENCOUNTER — Other Ambulatory Visit: Payer: Self-pay | Admitting: Cardiovascular Disease

## 2010-09-01 ENCOUNTER — Other Ambulatory Visit: Payer: Self-pay | Admitting: Cardiovascular Disease

## 2010-09-27 ENCOUNTER — Other Ambulatory Visit: Payer: Self-pay | Admitting: Internal Medicine

## 2010-10-01 ENCOUNTER — Other Ambulatory Visit: Payer: Self-pay

## 2010-10-01 ENCOUNTER — Other Ambulatory Visit: Payer: Self-pay | Admitting: Internal Medicine

## 2010-10-01 DIAGNOSIS — F411 Generalized anxiety disorder: Secondary | ICD-10-CM

## 2010-10-01 NOTE — Telephone Encounter (Signed)
Refill request from [facility] [address] Fax [phone] for Alprazolam 1 mg, last OV 07/04/2010, last refill 05/30/2010 #120 with 3 refills.

## 2010-10-02 MED ORDER — ALPRAZOLAM 1 MG PO TABS: 1.0000 mg | ORAL_TABLET | Freq: Four times a day (QID) | ORAL | Status: DC | PRN

## 2010-10-02 NOTE — Telephone Encounter (Signed)
Faxed hardcopy of prescription to [facility] [address] at [phone]

## 2010-10-02 NOTE — Telephone Encounter (Signed)
Done hardcopy to dahlia/LIM B  

## 2010-10-03 ENCOUNTER — Other Ambulatory Visit: Payer: Self-pay

## 2010-10-03 MED ORDER — BUPROPION HCL ER (SR) 150 MG PO TB12: 150.0000 mg | ORAL_TABLET | Freq: Two times a day (BID) | ORAL | Status: DC

## 2010-10-03 NOTE — Telephone Encounter (Signed)
Routines to robin

## 2010-10-07 ENCOUNTER — Other Ambulatory Visit: Payer: Self-pay | Admitting: Internal Medicine

## 2010-10-22 ENCOUNTER — Telehealth: Payer: Self-pay

## 2010-10-22 NOTE — Telephone Encounter (Signed)
Ok to change to the XL and re-send the rx

## 2010-10-22 NOTE — Telephone Encounter (Signed)
Pharmacy fax informing that the patient has been taking Bupropion XL it was changed to SR, advise

## 2010-10-23 ENCOUNTER — Other Ambulatory Visit: Payer: Self-pay

## 2010-10-23 ENCOUNTER — Encounter: Payer: Self-pay | Admitting: Internal Medicine

## 2010-10-23 ENCOUNTER — Ambulatory Visit (INDEPENDENT_AMBULATORY_CARE_PROVIDER_SITE_OTHER): Payer: Medicare PPO | Admitting: *Deleted

## 2010-10-23 DIAGNOSIS — I459 Conduction disorder, unspecified: Secondary | ICD-10-CM

## 2010-10-23 DIAGNOSIS — I4891 Unspecified atrial fibrillation: Secondary | ICD-10-CM

## 2010-10-23 DIAGNOSIS — I428 Other cardiomyopathies: Secondary | ICD-10-CM

## 2010-10-23 LAB — REMOTE ICD DEVICE
ATRIAL PACING ICD: 1 pct
BAMS-0001: 150 {beats}/min
BATTERY VOLTAGE: 3.07 V
DEVICE MODEL ICD: 709089
LV LEAD IMPEDENCE ICD: 550 Ohm
RV LEAD AMPLITUDE: 12 mv
RV LEAD IMPEDENCE ICD: 640 Ohm
TZON-0004SLOWVT: 20
TZON-0005SLOWVT: 6
TZON-0010SLOWVT: 80 ms
VENTRICULAR PACING ICD: 100 pct

## 2010-10-23 MED ORDER — BUPROPION HCL ER (XL) 150 MG PO TB24: 150.0000 mg | ORAL_TABLET | Freq: Two times a day (BID) | ORAL | Status: DC

## 2010-10-23 NOTE — Telephone Encounter (Signed)
Sent prescription to pharmacy.  

## 2010-10-31 ENCOUNTER — Encounter: Payer: Self-pay | Admitting: *Deleted

## 2010-10-31 NOTE — Progress Notes (Signed)
icd remote check  

## 2010-11-18 ENCOUNTER — Encounter: Payer: Medicare PPO | Attending: Neurosurgery | Admitting: Neurosurgery

## 2010-11-18 DIAGNOSIS — M47812 Spondylosis without myelopathy or radiculopathy, cervical region: Secondary | ICD-10-CM

## 2010-11-18 DIAGNOSIS — M25819 Other specified joint disorders, unspecified shoulder: Secondary | ICD-10-CM | POA: Insufficient documentation

## 2010-11-18 DIAGNOSIS — M47817 Spondylosis without myelopathy or radiculopathy, lumbosacral region: Secondary | ICD-10-CM

## 2010-11-19 NOTE — Assessment & Plan Note (Signed)
Account [id number].  This is patient of Dr. Wynn Banker who is seen every 3 months for medicine refills due to her impingement syndrome of the shoulders.  She states she has no change in her pain.  At this point, she does have a little low back pain with no change as far as her level of pain.  She rates her pain at 6-8.  It is a sharp stabbing, aching type pain.  General activity level is 529.  Sleep patterns are fair.  Pain is worse with walking, bending, standing activities.  Rest, heat and ice, and medications tend to help.  She walks unassisted.  She can walk about 20 minutes at a time.  She climb steps and drives.  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  She needs some help with meal prep, household duties, and shopping.  REVIEW OF SYSTEMS:  Notable for the difficulties described above as well as some weakness, numbness, tremors, trouble walking, no suicidal thoughts or aberrant behaviors.  Oswestry score is 64.  PAST MEDICAL HISTORY:  Unchanged.  SOCIAL HISTORY:  Divorced.  FAMILY HISTORY:  Unchanged.  PHYSICAL EXAMINATION:  VITAL SIGNS:  Her blood pressure is 137/71, pulse 86, respirations 18, O2 sats 95 on room air.  Her motor strength 5/5 in the upper and lower extremities.  She does have limited range of motion in both shoulders with active and passive range of motion.  Her sensation is intact.  Constitutionally, she is within normal limits.  She is alert and oriented x3.  She has a normal gait.  ASSESSMENT: 1. Left shoulder impingement syndrome.  No apparent rotator cuff     involvement. 2. Lumbar and cervical spondylosis without myelopathy.  PLAN:  Refill hydrocodone 7.5/325 one p.o. q.i.d. 120 with no refill. She will follow up with me in 3 months.  Her questions were encouraged and answered.     Terre Hanneman L. Blima Dessert Electronically Signed    RLW/MedQ D:  11/18/2010 14:20:27  T:  11/19/2010 00:33:11  Job #:  [id number]

## 2010-11-26 ENCOUNTER — Other Ambulatory Visit: Payer: Self-pay | Admitting: *Deleted

## 2010-11-26 ENCOUNTER — Ambulatory Visit: Payer: Medicare PPO

## 2010-11-26 DIAGNOSIS — Z0000 Encounter for general adult medical examination without abnormal findings: Secondary | ICD-10-CM

## 2010-11-26 DIAGNOSIS — E119 Type 2 diabetes mellitus without complications: Secondary | ICD-10-CM

## 2010-11-26 LAB — BASIC METABOLIC PANEL
GFR: 111.77 mL/min (ref >60.00)
Glucose, Bld: 259 mg/dL — ABNORMAL HIGH (ref 70–99)
Potassium: 4.3 mEq/L (ref 3.5–5.1)
Sodium: 135 mEq/L (ref 135–145)

## 2010-11-26 LAB — LIPID PANEL: Triglycerides: 325 mg/dL — ABNORMAL HIGH (ref 0.0–149.0)

## 2010-11-26 LAB — URINALYSIS
Bilirubin Urine: NEGATIVE
Ketones, ur: NEGATIVE
Leukocytes, UA: NEGATIVE
Specific Gravity, Urine: 1.02 (ref 1.000–1.030)
Urobilinogen, UA: 0.2 (ref 0.0–1.0)

## 2010-11-26 LAB — MICROALBUMIN / CREATININE URINE RATIO
Creatinine,U: 64.6 mg/dL
Microalb Creat Ratio: 2.3 mg/g (ref 0.0–30.0)

## 2010-11-26 LAB — CBC WITH DIFFERENTIAL/PLATELET
Eosinophils Absolute: 0.1 10*3/uL (ref 0.0–0.7)
Eosinophils Relative: 1.5 % (ref 0.0–5.0)
Lymphocytes Relative: 40.7 % (ref 12.0–46.0)
MCV: 92.9 fl (ref 78.0–100.0)
Monocytes Absolute: 0.7 10*3/uL (ref 0.1–1.0)
Neutrophils Relative %: 48.9 % (ref 43.0–77.0)
Platelets: 267 10*3/uL (ref 150.0–400.0)
WBC: 8.2 10*3/uL (ref 4.5–10.5)

## 2010-11-26 LAB — LDL CHOLESTEROL, DIRECT: Direct LDL: 80.6 mg/dL

## 2010-11-26 LAB — HEPATIC FUNCTION PANEL
AST: 70 U/L — ABNORMAL HIGH (ref 0–37)
Total Bilirubin: 0.5 mg/dL (ref 0.3–1.2)

## 2010-11-26 LAB — TSH: TSH: 2.48 u[IU]/mL (ref 0.35–5.50)

## 2010-11-27 ENCOUNTER — Ambulatory Visit: Payer: Medicare PPO

## 2010-11-28 LAB — HEPATITIS PANEL, ACUTE
HCV Ab: NEGATIVE
Hep A IgM: NEGATIVE
Hep B C IgM: NEGATIVE
Hepatitis B Surface Ag: NEGATIVE

## 2010-12-01 ENCOUNTER — Other Ambulatory Visit: Payer: Self-pay | Admitting: Internal Medicine

## 2010-12-02 ENCOUNTER — Other Ambulatory Visit: Payer: Self-pay | Admitting: Internal Medicine

## 2010-12-02 ENCOUNTER — Ambulatory Visit (INDEPENDENT_AMBULATORY_CARE_PROVIDER_SITE_OTHER): Payer: Medicare PPO | Admitting: Internal Medicine

## 2010-12-02 ENCOUNTER — Encounter: Payer: Self-pay | Admitting: Internal Medicine

## 2010-12-02 ENCOUNTER — Other Ambulatory Visit (INDEPENDENT_AMBULATORY_CARE_PROVIDER_SITE_OTHER): Payer: Medicare PPO

## 2010-12-02 VITALS — BP 110/62 | HR 90 | Temp 98.2°F | Wt 150.1 lb

## 2010-12-02 DIAGNOSIS — E119 Type 2 diabetes mellitus without complications: Secondary | ICD-10-CM

## 2010-12-02 DIAGNOSIS — Z0000 Encounter for general adult medical examination without abnormal findings: Secondary | ICD-10-CM

## 2010-12-02 DIAGNOSIS — Z23 Encounter for immunization: Secondary | ICD-10-CM

## 2010-12-02 LAB — URINALYSIS, ROUTINE W REFLEX MICROSCOPIC
Bilirubin Urine: NEGATIVE
Hgb urine dipstick: NEGATIVE
Ketones, ur: NEGATIVE
Leukocytes, UA: NEGATIVE
Nitrite: NEGATIVE

## 2010-12-02 LAB — BASIC METABOLIC PANEL
Calcium: 9.5 mg/dL (ref 8.4–10.5)
GFR: 72.44 mL/min (ref >60.00)
Potassium: 4.6 mEq/L (ref 3.5–5.1)
Sodium: 135 mEq/L (ref 135–145)

## 2010-12-02 LAB — HEPATIC FUNCTION PANEL
AST: 70 U/L — ABNORMAL HIGH (ref 0–37)
Albumin: 3.9 g/dL (ref 3.5–5.2)
Alkaline Phosphatase: 65 U/L (ref 39–117)
Total Bilirubin: 0.5 mg/dL (ref 0.3–1.2)

## 2010-12-02 LAB — LIPID PANEL
HDL: 40.2 mg/dL (ref >39.00)
Total CHOL/HDL Ratio: 4
Triglycerides: 579 mg/dL — ABNORMAL HIGH (ref 0.0–149.0)
VLDL: 115.8 mg/dL — ABNORMAL HIGH (ref 0.0–40.0)

## 2010-12-02 LAB — CBC WITH DIFFERENTIAL/PLATELET
Basophils Absolute: 0.1 10*3/uL (ref 0.0–0.1)
Eosinophils Absolute: 0.1 10*3/uL (ref 0.0–0.7)
MCHC: 33.4 g/dL (ref 30.0–36.0)
MCV: 93.1 fl (ref 78.0–100.0)
Monocytes Absolute: 0.8 10*3/uL (ref 0.1–1.0)
Neutrophils Relative %: 63.7 % (ref 43.0–77.0)
Platelets: 273 10*3/uL (ref 150.0–400.0)
RDW: 12.9 % (ref 11.5–14.6)
WBC: 12.4 10*3/uL — ABNORMAL HIGH (ref 4.5–10.5)

## 2010-12-02 LAB — TSH: TSH: 2.77 u[IU]/mL (ref 0.35–5.50)

## 2010-12-02 MED ORDER — SITAGLIPTIN PHOSPHATE 100 MG PO TABS: 100.0000 mg | ORAL_TABLET | Freq: Every day | ORAL | Status: DC

## 2010-12-02 MED ORDER — GLIMEPIRIDE 4 MG PO TABS: 4.0000 mg | ORAL_TABLET | Freq: Two times a day (BID) | ORAL | Status: DC

## 2010-12-02 MED ORDER — PNEUMOCOCCAL VAC POLYVALENT 25 MCG/0.5ML IJ INJ: 0.5000 mL | INJECTION | Freq: Once | INTRAMUSCULAR | Status: DC

## 2010-12-02 NOTE — Patient Instructions (Addendum)
You had the flu shot, and pneumonia shot today Increase the januvia to 100 mg per day Increase the glimeparide to 4 mg twice per day Continue all other medications as before Please go to LAB in the Basement for the blood and/or urine tests to be done today Please call the phone number [phone] (the [employer]Tree System) for results of testing in 2-3 days;  When calling, simply dial the number, and when prompted enter the MRN number above (the Medical Record Number) and the # key, then the message should start. You will be contacted regarding the referral for: Diabetes education/diet Please return in 6 mo with Lab testing done 3-5 days before Please call if your blood sugars are still over 175 after the first week, as you may need actos to be added to your medications

## 2010-12-02 NOTE — Assessment & Plan Note (Signed)
Overall doing well, age appropriate education and counseling updated, referrals for preventative services and immunizations addressed, dietary and smoking counseling addressed, most recent labs and ECG reviewed.  I have personally reviewed and have noted: 1) the patient's medical and social history 2) The pt's use of alcohol, tobacco, and illicit drugs 3) The patient's current medications and supplements 4) Functional ability including ADL's, fall risk, home safety risk, hearing and visual impairment 5) Diet and physical activities 6) Evidence for depression or mood disorder 7) The patient's height, weight, and BMI have been recorded in the chart I have made referrals, and provided counseling and education based on review of the above For flu and pneumonia shot today

## 2010-12-03 ENCOUNTER — Other Ambulatory Visit: Payer: Self-pay | Admitting: Cardiovascular Disease

## 2010-12-04 MED ORDER — PIOGLITAZONE HCL 30 MG PO TABS: 30.0000 mg | ORAL_TABLET | Freq: Every day | ORAL | Status: DC

## 2010-12-06 ENCOUNTER — Encounter: Payer: Self-pay | Admitting: Internal Medicine
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
## 2010-12-06 NOTE — Assessment & Plan Note (Signed)
Uncontroleld, to increase the [place] to 100mg , glimeparide 4 mg bid, dietary compliance, and DM education\  Lab Results  Component Value Date   HGBA1C 12.8* 12/02/2010

## 2010-12-23 ENCOUNTER — Encounter: Payer: Medicare PPO | Attending: Internal Medicine | Admitting: *Deleted

## 2010-12-23 ENCOUNTER — Encounter: Payer: Self-pay | Admitting: *Deleted

## 2010-12-23 VITALS — Ht 61.0 in | Wt 151.8 lb

## 2010-12-23 DIAGNOSIS — E119 Type 2 diabetes mellitus without complications: Secondary | ICD-10-CM | POA: Insufficient documentation

## 2010-12-23 DIAGNOSIS — Z713 Dietary counseling and surveillance: Secondary | ICD-10-CM | POA: Insufficient documentation

## 2010-12-23 NOTE — Patient Instructions (Addendum)
Goals:  Follow Diabetes Meal Plan as instructed (yellow card) - Focus on carb portions at meals.  Eat 3 meals and 2 snacks, or every 3-5 hrs - NO meal skipping.  Limit carbohydrate intake to 30 grams carbohydrate/meal  Limit carbohydrate intake to 15 grams carbohydrate/snack  Add lean protein foods to meals/snacks.  Aim for >30 mins of physical activity daily  Bring food record and glucose log to your next nutrition visit
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
## 2010-12-24 LAB — CBC
HCT: 43.7
Hemoglobin: 14.9
MCV: 93.5
Platelets: 330
RDW: 12.5
WBC: 12.2 — ABNORMAL HIGH

## 2010-12-24 LAB — DIFFERENTIAL
Basophils Absolute: 0
Basophils Relative: 0
Eosinophils Absolute: 0.1
Neutro Abs: 7.4
Neutrophils Relative %: 61

## 2010-12-24 LAB — COMPREHENSIVE METABOLIC PANEL
ALT: 15
Alkaline Phosphatase: 36 — ABNORMAL LOW
BUN: 9
CO2: 27
Chloride: 106
GFR calc non Af Amer: >60
Glucose, Bld: 101 — ABNORMAL HIGH
Potassium: 4.9
Total Bilirubin: 0.7

## 2011-01-12 ENCOUNTER — Encounter: Payer: Self-pay | Admitting: *Deleted

## 2011-01-14 ENCOUNTER — Ambulatory Visit (INDEPENDENT_AMBULATORY_CARE_PROVIDER_SITE_OTHER): Payer: Medicare PPO | Admitting: Internal Medicine

## 2011-01-14 ENCOUNTER — Encounter: Payer: Self-pay | Admitting: Internal Medicine

## 2011-01-14 DIAGNOSIS — F17200 Nicotine dependence, unspecified, uncomplicated: Secondary | ICD-10-CM

## 2011-01-14 DIAGNOSIS — I10 Essential (primary) hypertension: Secondary | ICD-10-CM

## 2011-01-14 DIAGNOSIS — I4891 Unspecified atrial fibrillation: Secondary | ICD-10-CM

## 2011-01-14 DIAGNOSIS — I428 Other cardiomyopathies: Secondary | ICD-10-CM

## 2011-01-14 DIAGNOSIS — I519 Heart disease, unspecified: Secondary | ICD-10-CM

## 2011-01-14 LAB — ICD DEVICE OBSERVATION
AL AMPLITUDE: 2.4 mv
AL IMPEDENCE ICD: 337.5 Ohm
BAMS-0003: 70 {beats}/min
BATTERY VOLTAGE: 3.008 V
DEVICE MODEL ICD: 709089
LV LEAD IMPEDENCE ICD: 512.5 Ohm
LV LEAD THRESHOLD: 1 V
MODE SWITCH EPISODES: 1
RV LEAD IMPEDENCE ICD: 562.5 Ohm
RV LEAD THRESHOLD: 1 V
TOT-0006: 20100930000000
TOT-0007: 1
TOT-0008: 0
TOT-0010: 10
TZON-0003SLOWVT: 350 ms
TZON-0004SLOWVT: 20
TZON-0010SLOWVT: 80 ms
VENTRICULAR PACING ICD: 99.88 pct
VF: 0
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
## 2011-01-14 NOTE — Patient Instructions (Signed)
Your physician wants you to follow-up in: 12 months with Dr Jacquiline Doe will receive a reminder letter in the mail two months in advance. If you don't receive a letter, please call our office to schedule the follow-up appointment.   Remote monitoring is used to monitor your Pacemaker of ICD from home. This monitoring reduces the number of office visits required to check your device to one time per year. It allows [place] to keep an eye on the functioning of your device to ensure it is working properly. You are scheduled for a device check from home on 04/16/2011 You may send your transmission at any time that day. If you have a wireless device, the transmission will be sent automatically. After your physician reviews your transmission, you will receive a postcard with your next transmission date.

## 2011-01-14 NOTE — Assessment & Plan Note (Signed)
Stable No change required today Salt restriction and tobacco cessation advised

## 2011-01-14 NOTE — Assessment & Plan Note (Signed)
Doing well s/p CRT-D Normal BiV ICD function See Pace Art report No changes today

## 2011-01-14 NOTE — Assessment & Plan Note (Signed)
She is trying to cut back but is not ready to quit. Cessation again advised today.

## 2011-01-27 ENCOUNTER — Other Ambulatory Visit: Payer: Self-pay

## 2011-01-27 DIAGNOSIS — F411 Generalized anxiety disorder: Secondary | ICD-10-CM

## 2011-01-27 MED ORDER — ALPRAZOLAM 1 MG PO TABS: 1.0000 mg | ORAL_TABLET | Freq: Four times a day (QID) | ORAL | Status: DC | PRN

## 2011-01-27 NOTE — Telephone Encounter (Signed)
Done to [employer]

## 2011-01-28 ENCOUNTER — Other Ambulatory Visit: Payer: Self-pay

## 2011-01-28 MED ORDER — ZOLPIDEM TARTRATE 10 MG PO TABS: 10.0000 mg | ORAL_TABLET | Freq: Every evening | ORAL | Status: DC | PRN

## 2011-01-28 NOTE — Telephone Encounter (Signed)
Faxed hardcopy to pharmacy. 

## 2011-02-17 ENCOUNTER — Ambulatory Visit: Payer: Medicare PPO | Admitting: Neurosurgery

## 2011-02-27 ENCOUNTER — Other Ambulatory Visit: Payer: Self-pay | Admitting: Internal Medicine

## 2011-03-29 ENCOUNTER — Other Ambulatory Visit: Payer: Self-pay | Admitting: Internal Medicine

## 2011-04-16 ENCOUNTER — Ambulatory Visit (INDEPENDENT_AMBULATORY_CARE_PROVIDER_SITE_OTHER): Payer: Medicare PPO | Admitting: *Deleted

## 2011-04-16 DIAGNOSIS — I519 Heart disease, unspecified: Secondary | ICD-10-CM

## 2011-04-17 ENCOUNTER — Encounter: Payer: Self-pay | Admitting: Internal Medicine

## 2011-04-17 LAB — REMOTE ICD DEVICE
AL AMPLITUDE: 2 mv
ATRIAL PACING ICD: 1 pct
BAMS-0001: 150 {beats}/min
BAMS-0003: 70 {beats}/min
BRDY-0002RA: 60 {beats}/min
BRDY-0003RA: 120 {beats}/min
TZON-0003SLOWVT: 350 ms
TZON-0004SLOWVT: 20
TZON-0005SLOWVT: 6
VENTRICULAR PACING ICD: 100 pct

## 2011-04-24 NOTE — Progress Notes (Signed)
ICD remote 

## 2011-04-27 ENCOUNTER — Encounter: Payer: Self-pay | Admitting: *Deleted

## 2011-05-25 ENCOUNTER — Other Ambulatory Visit: Payer: Self-pay | Admitting: Internal Medicine

## 2011-05-31 ENCOUNTER — Other Ambulatory Visit: Payer: Self-pay | Admitting: Cardiovascular Disease

## 2011-06-01 ENCOUNTER — Ambulatory Visit (INDEPENDENT_AMBULATORY_CARE_PROVIDER_SITE_OTHER): Payer: Medicare PPO | Admitting: Internal Medicine

## 2011-06-01 ENCOUNTER — Encounter: Payer: Self-pay | Admitting: Internal Medicine

## 2011-06-01 ENCOUNTER — Other Ambulatory Visit (INDEPENDENT_AMBULATORY_CARE_PROVIDER_SITE_OTHER): Payer: Medicare PPO

## 2011-06-01 VITALS — BP 110/70 | HR 78 | Temp 97.8°F | Ht 61.0 in | Wt 148.0 lb

## 2011-06-01 DIAGNOSIS — R42 Dizziness and giddiness: Secondary | ICD-10-CM

## 2011-06-01 DIAGNOSIS — E119 Type 2 diabetes mellitus without complications: Secondary | ICD-10-CM

## 2011-06-01 DIAGNOSIS — M545 Low back pain, unspecified: Secondary | ICD-10-CM

## 2011-06-01 DIAGNOSIS — Z0000 Encounter for general adult medical examination without abnormal findings: Secondary | ICD-10-CM

## 2011-06-01 DIAGNOSIS — F411 Generalized anxiety disorder: Secondary | ICD-10-CM

## 2011-06-01 DIAGNOSIS — I10 Essential (primary) hypertension: Secondary | ICD-10-CM

## 2011-06-01 DIAGNOSIS — E785 Hyperlipidemia, unspecified: Secondary | ICD-10-CM

## 2011-06-01 DIAGNOSIS — G8929 Other chronic pain: Secondary | ICD-10-CM | POA: Insufficient documentation

## 2011-06-01 HISTORY — DX: Low back pain, unspecified: M54.50

## 2011-06-01 HISTORY — DX: Other chronic pain: G89.29

## 2011-06-01 LAB — BASIC METABOLIC PANEL
Chloride: 103 mEq/L (ref 96–112)
Creatinine, Ser: 0.7 mg/dL (ref 0.4–1.2)
Sodium: 138 mEq/L (ref 135–145)

## 2011-06-01 LAB — LDL CHOLESTEROL, DIRECT: Direct LDL: 91.3 mg/dL

## 2011-06-01 LAB — LIPID PANEL
Cholesterol: 161 mg/dL (ref 0–200)
Total CHOL/HDL Ratio: 3
VLDL: 61.6 mg/dL — ABNORMAL HIGH (ref 0.0–40.0)

## 2011-06-01 MED ORDER — ZOLPIDEM TARTRATE 10 MG PO TABS: 10.0000 mg | ORAL_TABLET | Freq: Every evening | ORAL | Status: DC | PRN

## 2011-06-01 MED ORDER — LISINOPRIL 20 MG PO TABS: 20.0000 mg | ORAL_TABLET | Freq: Every day | ORAL | Status: DC

## 2011-06-01 MED ORDER — DESOXIMETASONE 0.25 % EX CREA: TOPICAL_CREAM | Freq: Two times a day (BID) | CUTANEOUS | Status: DC

## 2011-06-01 MED ORDER — OMEPRAZOLE 20 MG PO CPDR: DELAYED_RELEASE_CAPSULE | ORAL | Status: DC

## 2011-06-01 MED ORDER — BUPROPION HCL ER (XL) 150 MG PO TB24: 300.0000 mg | ORAL_TABLET | Freq: Every day | ORAL | Status: DC

## 2011-06-01 MED ORDER — MECLIZINE HCL 12.5 MG PO TABS: 12.5000 mg | ORAL_TABLET | Freq: Three times a day (TID) | ORAL | Status: AC | PRN

## 2011-06-01 MED ORDER — ALPRAZOLAM 1 MG PO TABS: 1.0000 mg | ORAL_TABLET | Freq: Four times a day (QID) | ORAL | Status: DC | PRN

## 2011-06-01 MED ORDER — HYDROCODONE-ACETAMINOPHEN 7.5-325 MG PO TABS: 1.0000 | ORAL_TABLET | Freq: Four times a day (QID) | ORAL | Status: DC | PRN

## 2011-06-01 NOTE — Assessment & Plan Note (Signed)
stable overall by hx and exam, most recent data reviewed with pt, and pt to continue medical treatment as before le  BP Readings from Last 3 Encounters:  06/01/11 110/70  01/14/11 142/77  12/02/10 110/62

## 2011-06-01 NOTE — Assessment & Plan Note (Signed)
Severe uncontrolled worsening over the past 2 yrs with only 9 lb documented wt gain, has been to Dietary training, tolerated the actos, but still cbg's in 200;s often; fo a1c today, may need further OHA  Lab Results  Component Value Date   HGBA1C 12.8* 12/02/2010

## 2011-06-01 NOTE — Assessment & Plan Note (Signed)
stable overall by hx and exam, and pt to continue medical treatment as before, med refill given today  

## 2011-06-01 NOTE — Assessment & Plan Note (Signed)
stable overall by hx and exam, most recent data reviewed with pt, and pt to continue medical treatment as before  Lab Results  Component Value Date   LDLCALC 69 02/07/2009

## 2011-06-01 NOTE — Assessment & Plan Note (Signed)
stable overall by hx and exam, most recent data reviewed with pt, and pt to continue medical treatment as before  Lab Results  Component Value Date   WBC 12.4* 12/02/2010   HGB 13.5 12/02/2010   HCT 40.5 12/02/2010   PLT 273.0 12/02/2010   GLUCOSE 238* 06/01/2011   CHOL 161 06/01/2011   TRIG 308.0* 06/01/2011   HDL 46.70 06/01/2011   LDLDIRECT 91.3 06/01/2011   LDLCALC 69 02/07/2009   ALT 45* 12/02/2010   AST 70* 12/02/2010   NA 138 06/01/2011   K 4.0 06/01/2011   CL 103 06/01/2011   CREATININE 0.7 06/01/2011   BUN 14 06/01/2011   CO2 27 06/01/2011   TSH 2.77 12/02/2010   INR 1.0 ratio 11/29/2008   HGBA1C 9.5* 06/01/2011   MICROALBUR 0.9 12/02/2010
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
## 2011-06-01 NOTE — Patient Instructions (Addendum)
Continue all other medications as before Your refills were done as you requested today Please go to LAB in the Basement for the blood and/or urine tests to be done today You will be contacted by phone if any changes need to be made immediately.  Otherwise, you will receive a letter about your results with an explanation. Please return in 6 mo with Lab testing done 3-5 days before

## 2011-06-02 ENCOUNTER — Other Ambulatory Visit: Payer: Self-pay | Admitting: Internal Medicine

## 2011-06-02 ENCOUNTER — Encounter: Payer: Self-pay | Admitting: Internal Medicine

## 2011-06-02 MED ORDER — PIOGLITAZONE HCL 30 MG PO TABS: 45.0000 mg | ORAL_TABLET | Freq: Every day | ORAL | Status: DC

## 2011-06-02 MED ORDER — GLIMEPIRIDE 4 MG PO TABS: 4.0000 mg | ORAL_TABLET | Freq: Two times a day (BID) | ORAL | Status: DC

## 2011-06-09 ENCOUNTER — Other Ambulatory Visit: Payer: Self-pay

## 2011-06-09 MED ORDER — PIOGLITAZONE HCL 30 MG PO TABS: 45.0000 mg | ORAL_TABLET | Freq: Every day | ORAL | Status: DC

## 2011-07-16 ENCOUNTER — Encounter: Payer: Self-pay | Admitting: Internal Medicine

## 2011-07-16 ENCOUNTER — Ambulatory Visit (INDEPENDENT_AMBULATORY_CARE_PROVIDER_SITE_OTHER): Payer: Medicare PPO | Admitting: *Deleted

## 2011-07-16 DIAGNOSIS — I428 Other cardiomyopathies: Secondary | ICD-10-CM

## 2011-07-16 LAB — REMOTE ICD DEVICE
AL IMPEDENCE ICD: 330 Ohm
BATTERY VOLTAGE: 2.87 V
BRDY-0002RA: 60 {beats}/min
BRDY-0003RA: 120 {beats}/min
DEVICE MODEL ICD: 709089
HV IMPEDENCE: 66 Ohm
RV LEAD IMPEDENCE ICD: 590 Ohm
TZON-0005SLOWVT: 6
VENTRICULAR PACING ICD: 100 pct

## 2011-07-24 ENCOUNTER — Encounter: Payer: Self-pay | Admitting: *Deleted

## 2011-07-29 ENCOUNTER — Other Ambulatory Visit: Payer: Self-pay | Admitting: Cardiovascular Disease

## 2011-07-31 NOTE — Progress Notes (Signed)
ICD remote 

## 2011-08-27 ENCOUNTER — Other Ambulatory Visit: Payer: Self-pay

## 2011-08-27 ENCOUNTER — Other Ambulatory Visit: Payer: Self-pay | Admitting: Internal Medicine

## 2011-08-27 DIAGNOSIS — F411 Generalized anxiety disorder: Secondary | ICD-10-CM

## 2011-08-27 MED ORDER — ALPRAZOLAM 1 MG PO TABS: 1.0000 mg | ORAL_TABLET | Freq: Four times a day (QID) | ORAL | Status: DC | PRN

## 2011-08-27 MED ORDER — HYDROCODONE-ACETAMINOPHEN 7.5-325 MG PO TABS: 1.0000 | ORAL_TABLET | Freq: Four times a day (QID) | ORAL | Status: DC | PRN

## 2011-08-27 NOTE — Telephone Encounter (Signed)
Faxed hardcopy to pharmacy. 

## 2011-08-27 NOTE — Telephone Encounter (Signed)
Done hardcopy to robin  

## 2011-09-19 ENCOUNTER — Other Ambulatory Visit: Payer: Self-pay | Admitting: Internal Medicine

## 2011-09-24 ENCOUNTER — Telehealth: Payer: Self-pay

## 2011-09-24 NOTE — Telephone Encounter (Signed)
Called left message to call back 

## 2011-09-24 NOTE — Telephone Encounter (Signed)
Called [employer] to initiate PA of Zolpidem 10 mg.  [employer] informed will hear back within 48 hours.

## 2011-09-24 NOTE — Telephone Encounter (Signed)
[employer] denied PA for Zolpidem stated amount exceeded annual limit of 90 tablets/capsules and that quantities exceeding these limits may be obtained at the [facility] expense. Please advise

## 2011-09-24 NOTE — Telephone Encounter (Signed)
Informed the patient and she is ok paying out of pocket of amount exceeds 90

## 2011-09-24 NOTE — Telephone Encounter (Signed)
Ok for pt to pay out of pocket if ok with pt

## 2011-10-22 ENCOUNTER — Ambulatory Visit (INDEPENDENT_AMBULATORY_CARE_PROVIDER_SITE_OTHER): Payer: Medicare PPO | Admitting: *Deleted

## 2011-10-22 DIAGNOSIS — I519 Heart disease, unspecified: Secondary | ICD-10-CM

## 2011-10-26 ENCOUNTER — Encounter: Payer: Self-pay | Admitting: *Deleted

## 2011-10-26 ENCOUNTER — Encounter: Payer: Self-pay | Admitting: Internal Medicine

## 2011-10-26 DIAGNOSIS — Z95810 Presence of automatic (implantable) cardiac defibrillator: Secondary | ICD-10-CM | POA: Insufficient documentation

## 2011-10-26 LAB — REMOTE ICD DEVICE
AL IMPEDENCE ICD: 310 Ohm
BAMS-0003: 70 {beats}/min
BATTERY VOLTAGE: 2.72 V
DEVICE MODEL ICD: 709089
HV IMPEDENCE: 68 Ohm
LV LEAD IMPEDENCE ICD: 490 Ohm
RV LEAD IMPEDENCE ICD: 530 Ohm
TZON-0003SLOWVT: 350 ms
TZON-0004SLOWVT: 20
TZON-0005SLOWVT: 6

## 2011-10-27 ENCOUNTER — Other Ambulatory Visit: Payer: Self-pay | Admitting: Internal Medicine

## 2011-10-27 ENCOUNTER — Encounter: Payer: Self-pay | Admitting: *Deleted

## 2011-11-25 ENCOUNTER — Other Ambulatory Visit (INDEPENDENT_AMBULATORY_CARE_PROVIDER_SITE_OTHER): Payer: Medicaid Other

## 2011-11-25 DIAGNOSIS — Z0000 Encounter for general adult medical examination without abnormal findings: Secondary | ICD-10-CM

## 2011-11-25 DIAGNOSIS — E119 Type 2 diabetes mellitus without complications: Secondary | ICD-10-CM

## 2011-11-25 DIAGNOSIS — E785 Hyperlipidemia, unspecified: Secondary | ICD-10-CM

## 2011-11-25 LAB — BASIC METABOLIC PANEL
BUN: 15 mg/dL (ref 6–23)
CO2: 28 mEq/L (ref 19–32)
Chloride: 103 mEq/L (ref 96–112)
Potassium: 4.7 mEq/L (ref 3.5–5.1)

## 2011-11-25 LAB — CBC WITH DIFFERENTIAL/PLATELET
Eosinophils Absolute: 0.1 10*3/uL (ref 0.0–0.7)
Lymphocytes Relative: 39.4 % (ref 12.0–46.0)
MCHC: 32.6 g/dL (ref 30.0–36.0)
MCV: 94.5 fl (ref 78.0–100.0)
Monocytes Absolute: 0.9 10*3/uL (ref 0.1–1.0)
Neutrophils Relative %: 49.2 % (ref 43.0–77.0)
Platelets: 228 10*3/uL (ref 150.0–400.0)
WBC: 9.2 10*3/uL (ref 4.5–10.5)

## 2011-11-25 LAB — HEPATIC FUNCTION PANEL
ALT: 24 U/L (ref 0–35)
AST: 21 U/L (ref 0–37)
Bilirubin, Direct: 0.1 mg/dL (ref 0.0–0.3)
Total Bilirubin: 0.6 mg/dL (ref 0.3–1.2)
Total Protein: 7.2 g/dL (ref 6.0–8.3)

## 2011-11-25 LAB — URINALYSIS, ROUTINE W REFLEX MICROSCOPIC
Bilirubin Urine: NEGATIVE
Hgb urine dipstick: NEGATIVE
Nitrite: NEGATIVE
Urobilinogen, UA: 0.2 (ref 0.0–1.0)

## 2011-11-25 LAB — LIPID PANEL
Cholesterol: 141 mg/dL (ref 0–200)
LDL Cholesterol: 72 mg/dL (ref 0–99)

## 2011-11-25 LAB — MICROALBUMIN / CREATININE URINE RATIO
Creatinine,U: 43.7 mg/dL
Microalb Creat Ratio: 1.6 mg/g (ref 0.0–30.0)
Microalb, Ur: 0.7 mg/dL (ref 0.0–1.9)

## 2011-11-25 LAB — TSH: TSH: 1.65 u[IU]/mL (ref 0.35–5.50)

## 2011-11-26 ENCOUNTER — Encounter: Payer: Self-pay | Admitting: Internal Medicine

## 2011-11-26 LAB — VITAMIN B12: Vitamin B-12: 246 pg/mL (ref 211–911)

## 2011-11-30 ENCOUNTER — Encounter: Payer: Self-pay | Admitting: Internal Medicine

## 2011-11-30 ENCOUNTER — Ambulatory Visit (INDEPENDENT_AMBULATORY_CARE_PROVIDER_SITE_OTHER): Payer: Medicare PPO | Admitting: Internal Medicine

## 2011-11-30 VITALS — BP 130/80 | HR 82 | Temp 97.5°F | Ht 61.0 in | Wt 153.2 lb

## 2011-11-30 DIAGNOSIS — H6091 Unspecified otitis externa, right ear: Secondary | ICD-10-CM

## 2011-11-30 DIAGNOSIS — H60399 Other infective otitis externa, unspecified ear: Secondary | ICD-10-CM

## 2011-11-30 DIAGNOSIS — E119 Type 2 diabetes mellitus without complications: Secondary | ICD-10-CM

## 2011-11-30 DIAGNOSIS — Z0000 Encounter for general adult medical examination without abnormal findings: Secondary | ICD-10-CM

## 2011-11-30 DIAGNOSIS — F411 Generalized anxiety disorder: Secondary | ICD-10-CM

## 2011-11-30 DIAGNOSIS — Z23 Encounter for immunization: Secondary | ICD-10-CM

## 2011-11-30 MED ORDER — NEOMYCIN-POLYMYXIN-HC 3.5-10000-1 OT SOLN: 3.0000 [drp] | Freq: Four times a day (QID) | OTIC | Status: DC

## 2011-11-30 MED ORDER — FUROSEMIDE 20 MG PO TABS: 20.0000 mg | ORAL_TABLET | Freq: Every day | ORAL | Status: DC

## 2011-11-30 MED ORDER — SITAGLIPTIN PHOSPHATE 100 MG PO TABS: 100.0000 mg | ORAL_TABLET | Freq: Every day | ORAL | Status: DC

## 2011-11-30 MED ORDER — TRAZODONE HCL 50 MG PO TABS: 50.0000 mg | ORAL_TABLET | Freq: Every evening | ORAL | Status: DC | PRN

## 2011-11-30 MED ORDER — ALPRAZOLAM 1 MG PO TABS: 1.0000 mg | ORAL_TABLET | Freq: Four times a day (QID) | ORAL | Status: DC | PRN

## 2011-11-30 MED ORDER — HYDROCODONE-ACETAMINOPHEN 7.5-325 MG PO TABS: 1.0000 | ORAL_TABLET | Freq: Four times a day (QID) | ORAL | Status: DC | PRN

## 2011-11-30 NOTE — Assessment & Plan Note (Signed)
stable overall by hx and exam, , and pt to continue medical treatment as before   

## 2011-11-30 NOTE — Assessment & Plan Note (Signed)

## 2011-11-30 NOTE — Assessment & Plan Note (Signed)
stable overall by hx and exam, most recent data reviewed with pt, and pt to continue medical treatment as before Lab Results  Component Value Date   HGBA1C 7.6* 11/25/2011    
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
## 2011-11-30 NOTE — Patient Instructions (Addendum)
You had the flu shot today Take all new medications as prescribed - the antibiotic drops for the right ear Continue all other medications as before Your refills were done as requested, with the xanax and pain medication given hardcopy Please have the pharmacy call with any other refills you may need. No further blood work needed today Please continue your efforts at being more active, low cholesterol diabetic diet, and weight control. Please check with [employer] about coverage for the Shingles shot;  If covered, please make nurse appt to get the shot Please remember to sign up for My Chart at your earliest convenience, as this will be important to you in the future with finding out test results. Please return in 6 mo with Lab testing done 3-5 days before

## 2011-11-30 NOTE — Assessment & Plan Note (Signed)
Mild to mod, for antibx course,  to f/u any worsening symptoms or concerns 

## 2011-12-02 ENCOUNTER — Other Ambulatory Visit: Payer: Self-pay | Admitting: Internal Medicine

## 2011-12-02 NOTE — Telephone Encounter (Signed)
Pt recently seen with ext ear infection, tx  With ear drops  No need for amoxil at this time

## 2011-12-02 NOTE — Telephone Encounter (Signed)
Called the patient informed of MD instructions.  She stated she has gotten much sicker since being seen for ear problem.  Her eye has drainage, head congestion and bad cough.  Please advise

## 2011-12-02 NOTE — Telephone Encounter (Signed)
Would need OV, as this could be allergies?

## 2011-12-03 ENCOUNTER — Ambulatory Visit (INDEPENDENT_AMBULATORY_CARE_PROVIDER_SITE_OTHER): Payer: Medicaid Other | Admitting: Internal Medicine

## 2011-12-03 ENCOUNTER — Encounter: Payer: Self-pay | Admitting: Internal Medicine

## 2011-12-03 VITALS — BP 122/80 | HR 86 | Temp 99.3°F | Ht 61.0 in | Wt 151.1 lb

## 2011-12-03 DIAGNOSIS — I10 Essential (primary) hypertension: Secondary | ICD-10-CM

## 2011-12-03 DIAGNOSIS — E119 Type 2 diabetes mellitus without complications: Secondary | ICD-10-CM

## 2011-12-03 DIAGNOSIS — E785 Hyperlipidemia, unspecified: Secondary | ICD-10-CM

## 2011-12-03 DIAGNOSIS — J019 Acute sinusitis, unspecified: Secondary | ICD-10-CM

## 2011-12-03 MED ORDER — PROMETHAZINE HCL 25 MG RE SUPP: 25.0000 mg | Freq: Four times a day (QID) | RECTAL | Status: DC | PRN

## 2011-12-03 MED ORDER — AZITHROMYCIN 250 MG PO TABS: ORAL_TABLET | ORAL | Status: DC

## 2011-12-03 NOTE — Telephone Encounter (Signed)
Patient informed and transferred to scheduler to put on schedule today as symptoms have worsened since yesterday, diarrhea and vomiting.

## 2011-12-03 NOTE — Patient Instructions (Addendum)
Take all new medications as prescribed Continue all other medications as before  

## 2011-12-05 ENCOUNTER — Encounter: Payer: Self-pay | Admitting: Internal Medicine

## 2011-12-05 NOTE — Assessment & Plan Note (Signed)
stable overall by hx and exam, most recent data reviewed with pt, and pt to continue medical treatment as before BP Readings from Last 3 Encounters:  12/03/11 122/80  11/30/11 130/80  06/01/11 110/70

## 2011-12-05 NOTE — Assessment & Plan Note (Signed)
Mild to mod, for antibx course,  to f/u any worsening symptoms or concerns 

## 2011-12-05 NOTE — Assessment & Plan Note (Signed)
stable overall by hx and exam, most recent data reviewed with pt, and pt to continue medical treatment as before Lab Results  Component Value Date   LDLCALC 72 11/25/2011
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
## 2011-12-05 NOTE — Assessment & Plan Note (Signed)
stable overall by hx and exam, most recent data reviewed with pt, and pt to continue medical treatment as before Lab Results  Component Value Date   HGBA1C 7.6* 11/25/2011

## 2011-12-23 ENCOUNTER — Other Ambulatory Visit: Payer: Self-pay | Admitting: Internal Medicine

## 2011-12-31 ENCOUNTER — Ambulatory Visit (INDEPENDENT_AMBULATORY_CARE_PROVIDER_SITE_OTHER): Payer: Medicare PPO | Admitting: Cardiovascular Disease

## 2011-12-31 ENCOUNTER — Encounter: Payer: Self-pay | Admitting: Cardiovascular Disease

## 2011-12-31 VITALS — BP 140/79 | HR 84 | Ht 61.0 in | Wt 155.0 lb

## 2011-12-31 DIAGNOSIS — I428 Other cardiomyopathies: Secondary | ICD-10-CM

## 2011-12-31 NOTE — Patient Instructions (Addendum)
Your physician wants you to follow-up in:  12 months.  You will receive a reminder letter in the mail two months in advance. If you don't receive a letter, please call our office to schedule the follow-up appointment.   
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
## 2012-01-14 ENCOUNTER — Encounter: Payer: Self-pay | Admitting: Internal Medicine

## 2012-01-14 ENCOUNTER — Ambulatory Visit (INDEPENDENT_AMBULATORY_CARE_PROVIDER_SITE_OTHER): Payer: Medicare PPO | Admitting: Internal Medicine

## 2012-01-14 VITALS — BP 132/64 | HR 84 | Ht 61.0 in | Wt 154.0 lb

## 2012-01-14 DIAGNOSIS — I428 Other cardiomyopathies: Secondary | ICD-10-CM

## 2012-01-14 DIAGNOSIS — I10 Essential (primary) hypertension: Secondary | ICD-10-CM

## 2012-01-14 DIAGNOSIS — I519 Heart disease, unspecified: Secondary | ICD-10-CM

## 2012-01-14 DIAGNOSIS — F17200 Nicotine dependence, unspecified, uncomplicated: Secondary | ICD-10-CM

## 2012-01-14 NOTE — Patient Instructions (Signed)
Your physician wants you to follow-up in: 12 months with Dr Jacquiline Doe will receive a reminder letter in the mail two months in advance. If you don't receive a letter, please call our office to schedule the follow-up appointment.   Remote monitoring is used to monitor your Pacemaker of ICD from home. This monitoring reduces the number of office visits required to check your device to one time per year. It allows [place] to keep an eye on the functioning of your device to ensure it is working properly. You are scheduled for a device check from home on 04/18/12. You may send your transmission at any time that day. If you have a wireless device, the transmission will be sent automatically. After your physician reviews your transmission, you will receive a postcard with your next transmission date.

## 2012-01-17 ENCOUNTER — Encounter: Payer: Self-pay | Admitting: Internal Medicine

## 2012-01-17 NOTE — Assessment & Plan Note (Signed)
Doing well s/p CRT-D Normal BiV ICD function See Pace Art report No changes today  I do not see that she has had an echo since her BiV device was implanted.  We will order an echo to evaluate her EF at this time.
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
## 2012-01-17 NOTE — Assessment & Plan Note (Signed)
Smoking cessation was discussed at length She remains not ready to quit but has cut back.

## 2012-01-17 NOTE — Assessment & Plan Note (Signed)
Stable No change required today  

## 2012-01-20 ENCOUNTER — Encounter: Payer: Self-pay | Admitting: Internal Medicine

## 2012-01-21 LAB — ICD DEVICE OBSERVATION
ATRIAL PACING ICD: 0.5 pct
BAMS-0001: 150 {beats}/min
BAMS-0003: 70 {beats}/min
FVT: 0
HV IMPEDENCE: 72 Ohm
LV LEAD IMPEDENCE ICD: 487.5 Ohm
RV LEAD AMPLITUDE: 12 mv
RV LEAD IMPEDENCE ICD: 612.5 Ohm
TOT-0006: 20100930000000
TOT-0007: 1
TOT-0008: 0
TOT-0009: 1
TZON-0003SLOWVT: 350 ms
TZON-0004SLOWVT: 20
TZON-0005SLOWVT: 6

## 2012-01-27 ENCOUNTER — Other Ambulatory Visit: Payer: Self-pay | Admitting: Internal Medicine

## 2012-02-09 ENCOUNTER — Other Ambulatory Visit (HOSPITAL_COMMUNITY): Payer: Self-pay | Admitting: Internal Medicine

## 2012-02-09 DIAGNOSIS — I509 Heart failure, unspecified: Secondary | ICD-10-CM

## 2012-02-10 ENCOUNTER — Ambulatory Visit (HOSPITAL_COMMUNITY): Payer: Medicare PPO | Attending: Cardiology | Admitting: Radiology

## 2012-02-10 DIAGNOSIS — I447 Left bundle-branch block, unspecified: Secondary | ICD-10-CM | POA: Insufficient documentation

## 2012-02-10 DIAGNOSIS — I10 Essential (primary) hypertension: Secondary | ICD-10-CM | POA: Insufficient documentation

## 2012-02-10 DIAGNOSIS — E785 Hyperlipidemia, unspecified: Secondary | ICD-10-CM | POA: Insufficient documentation

## 2012-02-10 DIAGNOSIS — E119 Type 2 diabetes mellitus without complications: Secondary | ICD-10-CM | POA: Insufficient documentation

## 2012-02-10 DIAGNOSIS — I509 Heart failure, unspecified: Secondary | ICD-10-CM

## 2012-02-10 DIAGNOSIS — I428 Other cardiomyopathies: Secondary | ICD-10-CM | POA: Insufficient documentation

## 2012-02-10 DIAGNOSIS — I079 Rheumatic tricuspid valve disease, unspecified: Secondary | ICD-10-CM | POA: Insufficient documentation

## 2012-02-10 DIAGNOSIS — I5020 Unspecified systolic (congestive) heart failure: Secondary | ICD-10-CM | POA: Insufficient documentation

## 2012-02-10 NOTE — Progress Notes (Signed)
Echocardiogram performed.  

## 2012-02-29 ENCOUNTER — Other Ambulatory Visit: Payer: Self-pay | Admitting: Cardiovascular Disease

## 2012-02-29 ENCOUNTER — Other Ambulatory Visit: Payer: Self-pay | Admitting: Internal Medicine

## 2012-03-28 ENCOUNTER — Other Ambulatory Visit: Payer: Self-pay | Admitting: Internal Medicine

## 2012-04-18 ENCOUNTER — Encounter: Payer: Self-pay | Admitting: Internal Medicine

## 2012-04-18 ENCOUNTER — Ambulatory Visit (INDEPENDENT_AMBULATORY_CARE_PROVIDER_SITE_OTHER): Payer: Medicare PPO | Admitting: *Deleted

## 2012-04-18 ENCOUNTER — Other Ambulatory Visit: Payer: Self-pay | Admitting: Internal Medicine

## 2012-04-18 DIAGNOSIS — Z95810 Presence of automatic (implantable) cardiac defibrillator: Secondary | ICD-10-CM

## 2012-04-18 DIAGNOSIS — I428 Other cardiomyopathies: Secondary | ICD-10-CM

## 2012-04-23 LAB — REMOTE ICD DEVICE
AL AMPLITUDE: 2.1 mv
AL IMPEDENCE ICD: 330 Ohm
ATRIAL PACING ICD: 1.1 pct
BAMS-0001: 150 {beats}/min
BAMS-0003: 70 {beats}/min
BATTERY VOLTAGE: 2.6 V
BRDY-0004RA: 120 {beats}/min
DEVICE MODEL ICD: 709089
HV IMPEDENCE: 75 Ohm
TZON-0003SLOWVT: 350 ms
TZON-0004SLOWVT: 20
TZON-0010SLOWVT: 80 ms
VENTRICULAR PACING ICD: 100 pct

## 2012-05-16 ENCOUNTER — Encounter: Payer: Self-pay | Admitting: *Deleted

## 2012-05-16 ENCOUNTER — Other Ambulatory Visit: Payer: Self-pay | Admitting: Internal Medicine

## 2012-05-25 ENCOUNTER — Other Ambulatory Visit: Payer: Self-pay | Admitting: Internal Medicine

## 2012-05-25 NOTE — Telephone Encounter (Signed)
Done hardcopy to robin  

## 2012-05-25 NOTE — Telephone Encounter (Signed)
Faxed hardcopy to pharmacy. 

## 2012-05-26 ENCOUNTER — Other Ambulatory Visit (INDEPENDENT_AMBULATORY_CARE_PROVIDER_SITE_OTHER): Payer: Medicare PPO

## 2012-05-26 DIAGNOSIS — E119 Type 2 diabetes mellitus without complications: Secondary | ICD-10-CM

## 2012-05-26 LAB — LIPID PANEL
HDL: 45.6 mg/dL (ref >39.00)
LDL Cholesterol: 61 mg/dL (ref 0–99)
VLDL: 36 mg/dL (ref 0.0–40.0)

## 2012-05-26 LAB — HEPATIC FUNCTION PANEL
ALT: 20 U/L (ref 0–35)
Albumin: 4.1 g/dL (ref 3.5–5.2)
Alkaline Phosphatase: 37 U/L — ABNORMAL LOW (ref 39–117)
Total Protein: 7.2 g/dL (ref 6.0–8.3)

## 2012-05-26 LAB — BASIC METABOLIC PANEL
Calcium: 9.1 mg/dL (ref 8.4–10.5)
GFR: 84.98 mL/min (ref >60.00)
Glucose, Bld: 110 mg/dL — ABNORMAL HIGH (ref 70–99)
Potassium: 3.9 mEq/L (ref 3.5–5.1)
Sodium: 138 mEq/L (ref 135–145)

## 2012-05-30 ENCOUNTER — Other Ambulatory Visit: Payer: Self-pay | Admitting: Internal Medicine

## 2012-05-30 ENCOUNTER — Encounter: Payer: Self-pay | Admitting: Internal Medicine

## 2012-05-30 ENCOUNTER — Ambulatory Visit (INDEPENDENT_AMBULATORY_CARE_PROVIDER_SITE_OTHER)
Admission: RE | Admit: 2012-05-30 | Discharge: 2012-05-30 | Disposition: A | Discharge status: Home / Self Care | Payer: Medicare PPO | Source: Ambulatory Visit | Attending: Internal Medicine | Admitting: Internal Medicine

## 2012-05-30 ENCOUNTER — Ambulatory Visit (INDEPENDENT_AMBULATORY_CARE_PROVIDER_SITE_OTHER): Payer: Medicare PPO | Admitting: Internal Medicine

## 2012-05-30 VITALS — BP 160/90 | HR 68 | Temp 97.8°F | Ht 61.0 in | Wt 156.5 lb

## 2012-05-30 DIAGNOSIS — Z0000 Encounter for general adult medical examination without abnormal findings: Secondary | ICD-10-CM

## 2012-05-30 DIAGNOSIS — M549 Dorsalgia, unspecified: Secondary | ICD-10-CM

## 2012-05-30 DIAGNOSIS — E119 Type 2 diabetes mellitus without complications: Secondary | ICD-10-CM

## 2012-05-30 DIAGNOSIS — E785 Hyperlipidemia, unspecified: Secondary | ICD-10-CM

## 2012-05-30 DIAGNOSIS — I10 Essential (primary) hypertension: Secondary | ICD-10-CM

## 2012-05-30 MED ORDER — HYDROCODONE-ACETAMINOPHEN 10-325 MG PO TABS: 1.0000 | ORAL_TABLET | Freq: Four times a day (QID) | ORAL | Status: DC | PRN

## 2012-05-30 MED ORDER — TIZANIDINE HCL 4 MG PO TABS: 4.0000 mg | ORAL_TABLET | Freq: Four times a day (QID) | ORAL | Status: DC | PRN

## 2012-05-30 MED ORDER — PREDNISONE 10 MG PO TABS: ORAL_TABLET | ORAL | Status: DC
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
## 2012-05-30 NOTE — Assessment & Plan Note (Signed)
After recent fall, no hx of osteoporosis but pain mod to severe to palpate lower thoracic/lumbar area in midline and paravertebral, for films today - r/o fx, also change hydrocodone to 10/325, and short trial courses of tizanidine and predpak asd

## 2012-05-30 NOTE — Assessment & Plan Note (Signed)
elev today liekly situational, stable overall by history and exam, recent data reviewed with pt, and pt to continue medical treatment as before,  to f/u any worsening symptoms or concerns BP Readings from Last 3 Encounters:  05/30/12 160/90  01/14/12 132/64  12/31/11 140/79

## 2012-05-30 NOTE — Patient Instructions (Addendum)
OK to stop the hydrocodone 7.5.325 Please take all new medication as prescribe - the hydrocodone at 10/325, tizanidine muscle relaxer, and prednisone Please go to the XRAY Department in the Basement (go straight as you get off the elevator) for the x-ray testing You will be contacted by phone if any changes need to be made immediately.  Otherwise, you will receive a letter about your results with an explanation Please remember to sign up for My Chart if you have not done so, as this will be important to you in the future with finding out test results, communicating by private email, and scheduling acute appointments online when needed. Please return in 6 months, or sooner if needed, with Lab testing done 3-5 days before

## 2012-05-30 NOTE — Assessment & Plan Note (Signed)
stable overall by history and exam, recent data reviewed with pt, and pt to continue medical treatment as before,  to f/u any worsening symptoms or concerns ble Lab Results  Component Value Date   LDLCALC 61 05/26/2012

## 2012-05-30 NOTE — Assessment & Plan Note (Signed)
.  stable overall by history and exam, recent data reviewed with pt, and pt to continue medical treatment as before,  to f/u any worsening symptoms or concerns Lab Results  Component Value Date   HGBA1C 7.2* 05/26/2012

## 2012-06-16 ENCOUNTER — Telehealth: Payer: Self-pay | Admitting: Cardiovascular Disease

## 2012-06-16 NOTE — Telephone Encounter (Signed)
Spoke with pt. She is going to have 3 teeth filled. I told her she did not need antibiotics from a cardiac standpoint.

## 2012-06-16 NOTE — Telephone Encounter (Signed)
New problem   Pt is having dental work on 06/21/11 and pt need to know if she needs to be on any antibiotics. Please call pt in reference to her possible need for antibiotics.

## 2012-06-25 ENCOUNTER — Other Ambulatory Visit: Payer: Self-pay | Admitting: Internal Medicine

## 2012-06-26 ENCOUNTER — Other Ambulatory Visit: Payer: Self-pay | Admitting: Cardiovascular Disease

## 2012-06-26 ENCOUNTER — Other Ambulatory Visit: Payer: Self-pay | Admitting: Internal Medicine

## 2012-06-27 NOTE — Telephone Encounter (Signed)
Done erx 

## 2012-07-14 ENCOUNTER — Emergency Department (HOSPITAL_BASED_OUTPATIENT_CLINIC_OR_DEPARTMENT_OTHER)
Admission: EM | Admit: 2012-07-14 | Discharge: 2012-07-15 | Disposition: A | Discharge status: Home / Self Care | Payer: Medicare PPO | Attending: Emergency Medicine | Admitting: Emergency Medicine

## 2012-07-14 ENCOUNTER — Other Ambulatory Visit: Payer: Self-pay

## 2012-07-14 ENCOUNTER — Encounter (HOSPITAL_BASED_OUTPATIENT_CLINIC_OR_DEPARTMENT_OTHER): Payer: Self-pay | Admitting: *Deleted

## 2012-07-14 DIAGNOSIS — Z8719 Personal history of other diseases of the digestive system: Secondary | ICD-10-CM | POA: Insufficient documentation

## 2012-07-14 DIAGNOSIS — S298XXA Other specified injuries of thorax, initial encounter: Secondary | ICD-10-CM | POA: Insufficient documentation

## 2012-07-14 DIAGNOSIS — M545 Low back pain, unspecified: Secondary | ICD-10-CM | POA: Insufficient documentation

## 2012-07-14 DIAGNOSIS — Z7982 Long term (current) use of aspirin: Secondary | ICD-10-CM | POA: Insufficient documentation

## 2012-07-14 DIAGNOSIS — Z87442 Personal history of urinary calculi: Secondary | ICD-10-CM | POA: Insufficient documentation

## 2012-07-14 DIAGNOSIS — G4700 Insomnia, unspecified: Secondary | ICD-10-CM | POA: Insufficient documentation

## 2012-07-14 DIAGNOSIS — Y93E9 Activity, other interior property and clothing maintenance: Secondary | ICD-10-CM | POA: Insufficient documentation

## 2012-07-14 DIAGNOSIS — Z95810 Presence of automatic (implantable) cardiac defibrillator: Secondary | ICD-10-CM | POA: Insufficient documentation

## 2012-07-14 DIAGNOSIS — Y92009 Unspecified place in unspecified non-institutional (private) residence as the place of occurrence of the external cause: Secondary | ICD-10-CM | POA: Insufficient documentation

## 2012-07-14 DIAGNOSIS — Z860100 Personal history of colon polyps, unspecified: Secondary | ICD-10-CM | POA: Insufficient documentation

## 2012-07-14 DIAGNOSIS — S99919A Unspecified injury of unspecified ankle, initial encounter: Secondary | ICD-10-CM | POA: Insufficient documentation

## 2012-07-14 DIAGNOSIS — Z79899 Other long term (current) drug therapy: Secondary | ICD-10-CM | POA: Insufficient documentation

## 2012-07-14 DIAGNOSIS — S60219A Contusion of unspecified wrist, initial encounter: Secondary | ICD-10-CM | POA: Insufficient documentation

## 2012-07-14 DIAGNOSIS — S8990XA Unspecified injury of unspecified lower leg, initial encounter: Secondary | ICD-10-CM | POA: Insufficient documentation

## 2012-07-14 DIAGNOSIS — G8929 Other chronic pain: Secondary | ICD-10-CM | POA: Insufficient documentation

## 2012-07-14 DIAGNOSIS — E119 Type 2 diabetes mellitus without complications: Secondary | ICD-10-CM | POA: Insufficient documentation

## 2012-07-14 DIAGNOSIS — R296 Repeated falls: Secondary | ICD-10-CM | POA: Insufficient documentation

## 2012-07-14 DIAGNOSIS — I509 Heart failure, unspecified: Secondary | ICD-10-CM | POA: Insufficient documentation

## 2012-07-14 DIAGNOSIS — IMO0002 Reserved for concepts with insufficient information to code with codable children: Secondary | ICD-10-CM | POA: Insufficient documentation

## 2012-07-14 DIAGNOSIS — N390 Urinary tract infection, site not specified: Secondary | ICD-10-CM

## 2012-07-14 DIAGNOSIS — T07XXXA Unspecified multiple injuries, initial encounter: Secondary | ICD-10-CM

## 2012-07-14 DIAGNOSIS — E032 Hypothyroidism due to medicaments and other exogenous substances: Secondary | ICD-10-CM | POA: Insufficient documentation

## 2012-07-14 DIAGNOSIS — E041 Nontoxic single thyroid nodule: Secondary | ICD-10-CM | POA: Insufficient documentation

## 2012-07-14 DIAGNOSIS — Z87828 Personal history of other (healed) physical injury and trauma: Secondary | ICD-10-CM | POA: Insufficient documentation

## 2012-07-14 DIAGNOSIS — I10 Essential (primary) hypertension: Secondary | ICD-10-CM | POA: Insufficient documentation

## 2012-07-14 DIAGNOSIS — Z8679 Personal history of other diseases of the circulatory system: Secondary | ICD-10-CM | POA: Insufficient documentation

## 2012-07-14 DIAGNOSIS — E785 Hyperlipidemia, unspecified: Secondary | ICD-10-CM | POA: Insufficient documentation

## 2012-07-14 DIAGNOSIS — F329 Major depressive disorder, single episode, unspecified: Secondary | ICD-10-CM | POA: Insufficient documentation

## 2012-07-14 DIAGNOSIS — Z87440 Personal history of urinary (tract) infections: Secondary | ICD-10-CM | POA: Insufficient documentation

## 2012-07-14 DIAGNOSIS — K219 Gastro-esophageal reflux disease without esophagitis: Secondary | ICD-10-CM | POA: Insufficient documentation

## 2012-07-14 DIAGNOSIS — F3289 Other specified depressive episodes: Secondary | ICD-10-CM | POA: Insufficient documentation

## 2012-07-14 DIAGNOSIS — F17200 Nicotine dependence, unspecified, uncomplicated: Secondary | ICD-10-CM | POA: Insufficient documentation

## 2012-07-14 DIAGNOSIS — Z9861 Coronary angioplasty status: Secondary | ICD-10-CM | POA: Insufficient documentation

## 2012-07-14 DIAGNOSIS — F411 Generalized anxiety disorder: Secondary | ICD-10-CM | POA: Insufficient documentation

## 2012-07-14 LAB — CBC WITH DIFFERENTIAL/PLATELET
Basophils Absolute: 0.1 10*3/uL (ref 0.0–0.1)
Basophils Relative: 1 % (ref 0–1)
Eosinophils Absolute: 0.1 10*3/uL (ref 0.0–0.7)
MCH: 31.3 pg (ref 26.0–34.0)
MCHC: 33.8 g/dL (ref 30.0–36.0)
Monocytes Relative: 10 % (ref 3–12)
Neutrophils Relative %: 42 % — ABNORMAL LOW (ref 43–77)
Platelets: 235 10*3/uL (ref 150–400)
RDW: 13 % (ref 11.5–15.5)

## 2012-07-14 LAB — URINALYSIS, ROUTINE W REFLEX MICROSCOPIC
Hgb urine dipstick: NEGATIVE
Protein, ur: NEGATIVE mg/dL
Urobilinogen, UA: 0.2 mg/dL (ref 0.0–1.0)

## 2012-07-14 LAB — URINE MICROSCOPIC-ADD ON

## 2012-07-14 LAB — BASIC METABOLIC PANEL
BUN: 14 mg/dL (ref 6–23)
Calcium: 10 mg/dL (ref 8.4–10.5)
Creatinine, Ser: 0.7 mg/dL (ref 0.50–1.10)
GFR calc non Af Amer: 86 mL/min — ABNORMAL LOW (ref >90)
Glucose, Bld: 73 mg/dL (ref 70–99)

## 2012-07-14 LAB — GLUCOSE, CAPILLARY: Glucose-Capillary: 74 mg/dL (ref 70–99)

## 2012-07-14 NOTE — ED Notes (Signed)
Pt back to room from triage via w/c, then to b/r via w/c and back to room 1 w/o change or incident. Pt alert, NAD, calm, interactive, resps e/u, speaking in clear complete sentences. Family x2 at [facility].

## 2012-07-14 NOTE — ED Notes (Signed)
Pt reports she turned while standing in kitchen and fell- states "I don't know what happened"- states she hit counter, stove, chair and table during fall- c/o left side pain- denies LOC

## 2012-07-15 ENCOUNTER — Emergency Department (HOSPITAL_BASED_OUTPATIENT_CLINIC_OR_DEPARTMENT_OTHER): Payer: Medicare PPO

## 2012-07-15 ENCOUNTER — Telehealth: Payer: Self-pay

## 2012-07-15 MED ORDER — FENTANYL CITRATE 0.05 MG/ML IJ SOLN
50.0000 ug | Freq: Once | INTRAMUSCULAR | Status: AC
  Administered 2012-07-15: 50 ug via INTRAVENOUS

## 2012-07-15 MED ORDER — FENTANYL CITRATE 0.05 MG/ML IJ SOLN
INTRAMUSCULAR | Status: AC
  Administered 2012-07-15: 50 ug via INTRAVENOUS
  Filled 2012-07-15: qty 2

## 2012-07-15 MED ORDER — FENTANYL CITRATE 0.05 MG/ML IJ SOLN: 50.0000 ug | Freq: Once | INTRAMUSCULAR | Status: AC

## 2012-07-15 MED ORDER — CIPROFLOXACIN HCL 250 MG PO TABS: 250.0000 mg | ORAL_TABLET | Freq: Two times a day (BID) | ORAL | Status: DC

## 2012-07-15 MED ORDER — HYDROCODONE-ACETAMINOPHEN 5-325 MG PO TABS
1.0000 | ORAL_TABLET | Freq: Once | ORAL | Status: AC
  Administered 2012-07-15: 1 via ORAL
  Filled 2012-07-15: qty 1

## 2012-07-15 MED ORDER — HYDROCODONE-ACETAMINOPHEN 10-325 MG PO TABS: 1.0000 | ORAL_TABLET | Freq: Four times a day (QID) | ORAL | Status: DC | PRN

## 2012-07-15 MED ORDER — CIPROFLOXACIN HCL 500 MG PO TABS
500.0000 mg | ORAL_TABLET | Freq: Once | ORAL | Status: AC
  Administered 2012-07-15: 500 mg via ORAL
  Filled 2012-07-15: qty 1

## 2012-07-15 NOTE — Telephone Encounter (Signed)
Patient will back if need PT
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
## 2012-07-15 NOTE — Telephone Encounter (Signed)
I can refer for outpatient PT if she wanst

## 2012-07-15 NOTE — Telephone Encounter (Signed)
Phone call from pt stating she fell yesterday but does not remember doing so. She believes she some how tripped over her feet in the kitchen and while trying not to fall she was holding onto the kitchen chair and ended up falling. She had a concussion. She went to [facility] Dept on [address] [phone]. She states her left foot to her shoulder is still sore.

## 2012-07-15 NOTE — ED Notes (Addendum)
Back from xray

## 2012-07-15 NOTE — ED Notes (Signed)
Patient transported to X-ray 

## 2012-07-17 LAB — URINE CULTURE: Colony Count: >=100000

## 2012-07-18 ENCOUNTER — Encounter: Payer: Medicare PPO | Admitting: *Deleted

## 2012-07-18 NOTE — ED Notes (Signed)
Post ED Visit - Positive Culture Follow-up  Culture report reviewed by antimicrobial stewardship pharmacist: []  Wes Dulaney, Pharm.D., BCPS [x]  Celedonio Miyamoto, Pharm.D., BCPS []  Georgina Pillion, Pharm.D., BCPS []  Buras, [address].D., BCPS, AAHIVP []  Estella Husk, Pharm.D., BCPS, AAHIV  Positive Urine culture Treated with Cipro, organism sensitive to the same and no further patient follow-up is required at this time.  Larena Sox 07/18/2012, 1:45 PM

## 2012-07-20 ENCOUNTER — Encounter: Payer: Self-pay | Admitting: *Deleted

## 2012-07-25 ENCOUNTER — Other Ambulatory Visit: Payer: Self-pay | Admitting: Internal Medicine

## 2012-07-25 ENCOUNTER — Encounter: Payer: Medicare PPO | Admitting: *Deleted

## 2012-07-25 LAB — REMOTE ICD DEVICE
AL AMPLITUDE: 1.8 mv
ATRIAL PACING ICD: 1 pct
BAMS-0003: 70 {beats}/min
BRDY-0002RA: 60 {beats}/min
BRDY-0003RA: 120 {beats}/min
TZON-0003SLOWVT: 350 ms
TZON-0005SLOWVT: 6

## 2012-08-12 ENCOUNTER — Encounter: Payer: Self-pay | Admitting: *Deleted

## 2012-08-21 ENCOUNTER — Other Ambulatory Visit: Payer: Self-pay | Admitting: Cardiovascular Disease

## 2012-08-24 ENCOUNTER — Encounter: Payer: Self-pay | Admitting: Internal Medicine

## 2012-08-25 ENCOUNTER — Other Ambulatory Visit: Payer: Self-pay | Admitting: Internal Medicine

## 2012-09-20 ENCOUNTER — Ambulatory Visit (INDEPENDENT_AMBULATORY_CARE_PROVIDER_SITE_OTHER): Payer: Medicare PPO | Admitting: Internal Medicine

## 2012-09-20 ENCOUNTER — Encounter: Payer: Self-pay | Admitting: Internal Medicine

## 2012-09-20 VITALS — BP 142/82 | HR 72 | Temp 97.1°F | Ht 61.0 in | Wt 154.0 lb

## 2012-09-20 DIAGNOSIS — E119 Type 2 diabetes mellitus without complications: Secondary | ICD-10-CM

## 2012-09-20 DIAGNOSIS — F411 Generalized anxiety disorder: Secondary | ICD-10-CM

## 2012-09-20 DIAGNOSIS — J019 Acute sinusitis, unspecified: Secondary | ICD-10-CM | POA: Insufficient documentation

## 2012-09-20 LAB — HM MAMMOGRAPHY: HM Mammogram: NEGATIVE

## 2012-09-20 MED ORDER — AMOXICILLIN-POT CLAVULANATE 875-125 MG PO TABS: 1.0000 | ORAL_TABLET | Freq: Two times a day (BID) | ORAL | Status: DC

## 2012-09-20 NOTE — Assessment & Plan Note (Signed)
Mild to mod, for antibx course,  to f/u any worsening symptoms or concerns 
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
## 2012-09-20 NOTE — Assessment & Plan Note (Signed)
stable overall by history and exam, recent data reviewed with pt, and pt to continue medical treatment as before,  to f/u any worsening symptoms or concerns Lab Results  Component Value Date   WBC 10.6* 07/14/2012   HGB 13.6 07/14/2012   HCT 40.2 07/14/2012   PLT 235 07/14/2012   GLUCOSE 73 07/14/2012   CHOL 143 05/26/2012   TRIG 180.0* 05/26/2012   HDL 45.60 05/26/2012   LDLDIRECT 91.3 06/01/2011   LDLCALC 61 05/26/2012   ALT 20 05/26/2012   AST 20 05/26/2012   NA 137 07/14/2012   K 3.5 07/14/2012   CL 98 07/14/2012   CREATININE 0.70 07/14/2012   BUN 14 07/14/2012   CO2 27 07/14/2012   TSH 1.65 11/25/2011   INR 1.0 ratio 11/29/2008   HGBA1C 7.2* 05/26/2012   MICROALBUR 0.7 11/25/2011

## 2012-09-20 NOTE — Assessment & Plan Note (Signed)
stable overall by history and exam, recent data reviewed with pt, and pt to continue medical treatment as before,  to f/u any worsening symptoms or concerns Lab Results  Component Value Date   HGBA1C 7.2* 05/26/2012   

## 2012-09-20 NOTE — Patient Instructions (Signed)
Please take all new medication as prescribed Please continue all other medications as before, and refills have been done if requested. Please have the pharmacy call with any other refills you may need.  Please remember to sign up for My Chart if you have not done so, as this will be important to you in the future with finding out test results, communicating by private email, and scheduling acute appointments online when needed.   

## 2012-09-26 ENCOUNTER — Other Ambulatory Visit: Payer: Self-pay | Admitting: Internal Medicine

## 2012-09-26 NOTE — Telephone Encounter (Signed)
Done hardcopy to robin  

## 2012-09-27 ENCOUNTER — Other Ambulatory Visit: Payer: Self-pay | Admitting: Internal Medicine

## 2012-09-27 NOTE — Telephone Encounter (Signed)
Faxed hardcopy to [facility] [address] [place] 

## 2012-10-24 ENCOUNTER — Encounter: Payer: Self-pay | Admitting: Internal Medicine

## 2012-10-24 ENCOUNTER — Ambulatory Visit (INDEPENDENT_AMBULATORY_CARE_PROVIDER_SITE_OTHER): Payer: Medicare PPO | Admitting: *Deleted

## 2012-10-24 DIAGNOSIS — Z95810 Presence of automatic (implantable) cardiac defibrillator: Secondary | ICD-10-CM

## 2012-10-24 DIAGNOSIS — I428 Other cardiomyopathies: Secondary | ICD-10-CM

## 2012-10-25 ENCOUNTER — Other Ambulatory Visit: Payer: Self-pay | Admitting: Cardiovascular Disease

## 2012-10-25 ENCOUNTER — Telehealth: Payer: Self-pay | Admitting: Internal Medicine

## 2012-10-25 LAB — REMOTE ICD DEVICE
AL AMPLITUDE: 2.1 mv
AL IMPEDENCE ICD: 330 Ohm
ATRIAL PACING ICD: 1.3 pct
BAMS-0003: 70 {beats}/min
BATTERY VOLTAGE: 2.56 V
BRDY-0002RA: 60 {beats}/min
BRDY-0003RA: 120 {beats}/min
TZON-0003SLOWVT: 350 ms
TZON-0004SLOWVT: 20

## 2012-10-25 NOTE — Telephone Encounter (Signed)
Pharmacist called to inform the patient requested to pickup her hydrocodone 7.5/325 #120 (orginal dated May 2014), also brought in a hardcopy of hydrocodone 10/325 #120 dated September 26, 2012.  Informed MD and MD instructed to inform the pharmacist to cancel the 7.5/325's and fill the 10/325.

## 2012-10-25 NOTE — Telephone Encounter (Signed)
Don erx 

## 2012-10-31 ENCOUNTER — Other Ambulatory Visit (INDEPENDENT_AMBULATORY_CARE_PROVIDER_SITE_OTHER): Payer: Medicare PPO

## 2012-10-31 ENCOUNTER — Ambulatory Visit (INDEPENDENT_AMBULATORY_CARE_PROVIDER_SITE_OTHER): Payer: Medicare PPO | Admitting: Internal Medicine

## 2012-10-31 ENCOUNTER — Encounter: Payer: Self-pay | Admitting: Internal Medicine

## 2012-10-31 VITALS — BP 132/80 | HR 71 | Temp 97.4°F | Ht 61.0 in | Wt 153.2 lb

## 2012-10-31 DIAGNOSIS — Z0000 Encounter for general adult medical examination without abnormal findings: Secondary | ICD-10-CM

## 2012-10-31 DIAGNOSIS — E119 Type 2 diabetes mellitus without complications: Secondary | ICD-10-CM

## 2012-10-31 LAB — HEPATIC FUNCTION PANEL
Albumin: 4.1 g/dL (ref 3.5–5.2)
Alkaline Phosphatase: 38 U/L — ABNORMAL LOW (ref 39–117)
Total Protein: 7.6 g/dL (ref 6.0–8.3)

## 2012-10-31 LAB — CBC WITH DIFFERENTIAL/PLATELET
Basophils Relative: 0.4 % (ref 0.0–3.0)
Eosinophils Absolute: 0.1 10*3/uL (ref 0.0–0.7)
HCT: 36.9 % (ref 36.0–46.0)
Hemoglobin: 12.4 g/dL (ref 12.0–15.0)
Lymphocytes Relative: 32.8 % (ref 12.0–46.0)
MCHC: 33.5 g/dL (ref 30.0–36.0)
MCV: 90.9 fl (ref 78.0–100.0)
Neutro Abs: 5.1 10*3/uL (ref 1.4–7.7)
RBC: 4.06 Mil/uL (ref 3.87–5.11)

## 2012-10-31 LAB — URINALYSIS, ROUTINE W REFLEX MICROSCOPIC
Hgb urine dipstick: NEGATIVE
Ketones, ur: NEGATIVE
Urine Glucose: NEGATIVE
Urobilinogen, UA: 0.2 (ref 0.0–1.0)

## 2012-10-31 LAB — BASIC METABOLIC PANEL
CO2: 28 mEq/L (ref 19–32)
Calcium: 9.7 mg/dL (ref 8.4–10.5)
Chloride: 101 mEq/L (ref 96–112)
Potassium: 4.4 mEq/L (ref 3.5–5.1)
Sodium: 136 mEq/L (ref 135–145)

## 2012-10-31 LAB — LIPID PANEL: Total CHOL/HDL Ratio: 3

## 2012-10-31 LAB — MICROALBUMIN / CREATININE URINE RATIO: Microalb Creat Ratio: 0.9 mg/g (ref 0.0–30.0)

## 2012-10-31 NOTE — Assessment & Plan Note (Signed)

## 2012-10-31 NOTE — Assessment & Plan Note (Signed)
stable overall by history and exam, recent data reviewed with pt, and pt to continue medical treatment as before,  to f/u any worsening symptoms or concerns Lab Results  Component Value Date   HGBA1C 7.2* 05/26/2012

## 2012-10-31 NOTE — Patient Instructions (Signed)
Please continue all other medications as before, and refills have been done if requested. Please continue your efforts at being more active, low cholesterol diet, and weight control. You are otherwise up to date with prevention measures today. Your blood work was drawn earlier today You will be contacted by phone if any changes need to be made immediately.  Otherwise, you will receive a letter about your results with an explanation, but please check with MyChart first.  Please remember to sign up for My Chart if you have not done so, as this will be important to you in the future with finding out test results, communicating by private email, and scheduling acute appointments online when needed.  Please return in 6 months, or sooner if needed, with Lab testing done 3-5 days before
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
## 2012-11-08 ENCOUNTER — Encounter: Payer: Self-pay | Admitting: *Deleted

## 2012-11-11 ENCOUNTER — Telehealth: Payer: Self-pay | Admitting: Internal Medicine

## 2012-11-11 NOTE — Telephone Encounter (Signed)
Pt power back on, transmitter not on though, pls advise

## 2012-11-14 NOTE — Telephone Encounter (Signed)
N/A. Will try pt back later/kwm
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
## 2012-11-18 ENCOUNTER — Other Ambulatory Visit: Payer: Self-pay

## 2012-11-18 MED ORDER — PRODIGY LANCETS 28G MISC: Status: DC

## 2012-11-18 MED ORDER — PRODIGY AUTOCODE BLOOD GLUCOSE W/DEVICE KIT: PACK | Status: DC

## 2012-11-18 MED ORDER — GLUCOSE BLOOD VI STRP: ORAL_STRIP | Status: DC

## 2012-11-21 ENCOUNTER — Other Ambulatory Visit: Payer: Self-pay | Admitting: Internal Medicine

## 2012-11-21 MED ORDER — ALPRAZOLAM 1 MG PO TABS: ORAL_TABLET | ORAL | Status: DC

## 2012-11-21 NOTE — Telephone Encounter (Signed)
MD out of office. Pls advise on refill.../lmb 

## 2012-11-21 NOTE — Telephone Encounter (Signed)
[facility]...[facility]

## 2012-12-08 ENCOUNTER — Other Ambulatory Visit: Payer: Self-pay

## 2012-12-08 MED ORDER — METOPROLOL TARTRATE 25 MG PO TABS: ORAL_TABLET | ORAL | Status: DC

## 2012-12-08 MED ORDER — LISINOPRIL 20 MG PO TABS: ORAL_TABLET | ORAL | Status: DC

## 2012-12-08 MED ORDER — BUPROPION HCL ER (XL) 150 MG PO TB24: 300.0000 mg | ORAL_TABLET | Freq: Every day | ORAL | Status: DC

## 2012-12-08 MED ORDER — ATORVASTATIN CALCIUM 40 MG PO TABS: 40.0000 mg | ORAL_TABLET | Freq: Every day | ORAL | Status: DC

## 2012-12-08 MED ORDER — LEVOTHYROXINE SODIUM 50 MCG PO TABS: 50.0000 ug | ORAL_TABLET | Freq: Every day | ORAL | Status: DC

## 2012-12-08 MED ORDER — GLIMEPIRIDE 4 MG PO TABS: 4.0000 mg | ORAL_TABLET | Freq: Two times a day (BID) | ORAL | Status: DC

## 2012-12-08 MED ORDER — SITAGLIPTIN PHOSPHATE 100 MG PO TABS: 100.0000 mg | ORAL_TABLET | Freq: Every day | ORAL | Status: DC

## 2012-12-08 MED ORDER — ALPRAZOLAM 1 MG PO TABS: ORAL_TABLET | ORAL | Status: DC

## 2012-12-08 MED ORDER — PIOGLITAZONE HCL 45 MG PO TABS: 45.0000 mg | ORAL_TABLET | Freq: Every day | ORAL | Status: DC

## 2012-12-08 MED ORDER — OMEPRAZOLE 20 MG PO CPDR: 20.0000 mg | DELAYED_RELEASE_CAPSULE | Freq: Every day | ORAL | Status: DC

## 2012-12-08 NOTE — Telephone Encounter (Signed)
okok

## 2012-12-08 NOTE — Addendum Note (Signed)
Addended by: Corwin Levins on: 12/08/2012 06:55 PM   Modules accepted: Orders

## 2012-12-08 NOTE — Telephone Encounter (Signed)
Done hardcopy to robin  

## 2012-12-08 NOTE — Addendum Note (Signed)
Addended by: Scharlene Gloss B on: 12/08/2012 04:08 PM   Modules accepted: Orders

## 2012-12-08 NOTE — Telephone Encounter (Signed)
Faxed hardcopy to mail order. 

## 2012-12-13 ENCOUNTER — Other Ambulatory Visit: Payer: Self-pay

## 2012-12-13 MED ORDER — HYDROCODONE-ACETAMINOPHEN 10-325 MG PO TABS: ORAL_TABLET | ORAL | Status: DC

## 2012-12-13 NOTE — Telephone Encounter (Signed)
Done hardcopy to [facility] - 3 mo total to [facility]

## 2012-12-13 NOTE — Telephone Encounter (Signed)
Called the patient informed to pickup hardcopy's of (3) prescriptions at the front desk at her convenience.

## 2012-12-13 NOTE — Telephone Encounter (Signed)
Called left message to call back 

## 2012-12-13 NOTE — Telephone Encounter (Signed)
k

## 2013-01-02 ENCOUNTER — Telehealth: Payer: Self-pay | Admitting: Internal Medicine

## 2013-01-02 NOTE — Telephone Encounter (Signed)
01/02/2013   Devina from [facility], mail order pharmacy, left message on behalf of PT stating that they need a new RX for ALPRAZolam Prudy Feeler) 1 MG tablet  So they can send her a 3 month supply instead of a 1 month supply.  If you can adjust the RX script, fax it to [phone].  Contacted pt to verify, and she did verify that the RX was ALPRAZolam (XANAX) 1 MG tablet.  Pt said that if RX script can't be adjusted, she wants to [facility].

## 2013-01-03 ENCOUNTER — Ambulatory Visit (INDEPENDENT_AMBULATORY_CARE_PROVIDER_SITE_OTHER): Payer: Medicare PPO | Admitting: Cardiovascular Disease

## 2013-01-03 ENCOUNTER — Other Ambulatory Visit: Payer: Self-pay | Admitting: Cardiovascular Disease

## 2013-01-03 ENCOUNTER — Encounter: Payer: Self-pay | Admitting: Cardiovascular Disease

## 2013-01-03 VITALS — BP 132/72 | HR 88 | Ht 61.0 in | Wt 155.0 lb

## 2013-01-03 DIAGNOSIS — I428 Other cardiomyopathies: Secondary | ICD-10-CM

## 2013-01-03 DIAGNOSIS — Z23 Encounter for immunization: Secondary | ICD-10-CM

## 2013-01-03 DIAGNOSIS — I10 Essential (primary) hypertension: Secondary | ICD-10-CM

## 2013-01-03 DIAGNOSIS — Z0000 Encounter for general adult medical examination without abnormal findings: Secondary | ICD-10-CM

## 2013-01-03 DIAGNOSIS — F17200 Nicotine dependence, unspecified, uncomplicated: Secondary | ICD-10-CM

## 2013-01-03 MED ORDER — FUROSEMIDE 20 MG PO TABS: 20.0000 mg | ORAL_TABLET | Freq: Every day | ORAL | Status: DC

## 2013-01-03 MED ORDER — ALPRAZOLAM 1 MG PO TABS: ORAL_TABLET | ORAL | Status: DC

## 2013-01-03 NOTE — Telephone Encounter (Signed)
Called [facility] to cancel but they have already shipped #120 on Oct. 3rd.  I did cancel refills for [facility] for the future. I did call the patient to inform future refills would be from her local pharmacy.
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
## 2013-01-03 NOTE — Telephone Encounter (Signed)
Ok for pt to let us know when next refill is needed

## 2013-01-03 NOTE — Patient Instructions (Signed)
Your physician wants you to follow-up in:  12 months.  You will receive a reminder letter in the mail two months in advance. If you don't receive a letter, please call our office to schedule the follow-up appointment.   

## 2013-01-03 NOTE — Telephone Encounter (Signed)
I would not feel comfortable with the large amount of controlled substance with a 3 mo supply   OK to cancel rx, and I can re-do for pt to use at local pharmacy

## 2013-01-04 NOTE — Telephone Encounter (Signed)
Patient is aware to do so.

## 2013-01-10 ENCOUNTER — Other Ambulatory Visit: Payer: Self-pay | Admitting: Internal Medicine

## 2013-01-18 ENCOUNTER — Other Ambulatory Visit: Payer: Self-pay | Admitting: Internal Medicine

## 2013-01-18 NOTE — Telephone Encounter (Signed)
To [facility], Dr. Jonny Ruiz pt

## 2013-01-18 NOTE — Telephone Encounter (Signed)
Too soon for xanax, see dates last filled and refills

## 2013-01-21 ENCOUNTER — Other Ambulatory Visit: Payer: Self-pay | Admitting: Internal Medicine

## 2013-01-25 ENCOUNTER — Encounter: Payer: Self-pay | Admitting: Internal Medicine

## 2013-01-25 ENCOUNTER — Ambulatory Visit (INDEPENDENT_AMBULATORY_CARE_PROVIDER_SITE_OTHER): Payer: Medicare PPO | Admitting: Internal Medicine

## 2013-01-25 ENCOUNTER — Telehealth: Payer: Self-pay | Admitting: Internal Medicine

## 2013-01-25 VITALS — BP 140/77 | HR 78 | Ht 61.0 in | Wt 155.0 lb

## 2013-01-25 DIAGNOSIS — Z95810 Presence of automatic (implantable) cardiac defibrillator: Secondary | ICD-10-CM

## 2013-01-25 DIAGNOSIS — I519 Heart disease, unspecified: Secondary | ICD-10-CM

## 2013-01-25 DIAGNOSIS — I428 Other cardiomyopathies: Secondary | ICD-10-CM

## 2013-01-25 DIAGNOSIS — I10 Essential (primary) hypertension: Secondary | ICD-10-CM

## 2013-01-25 MED ORDER — OMEPRAZOLE 20 MG PO CPDR: 20.0000 mg | DELAYED_RELEASE_CAPSULE | Freq: Two times a day (BID) | ORAL | Status: DC

## 2013-01-25 MED ORDER — LISINOPRIL 20 MG PO TABS: ORAL_TABLET | ORAL | Status: DC

## 2013-01-25 MED ORDER — ALPRAZOLAM 1 MG PO TABS: ORAL_TABLET | ORAL | Status: DC

## 2013-01-25 MED ORDER — METOPROLOL TARTRATE 25 MG PO TABS: ORAL_TABLET | ORAL | Status: DC
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
## 2013-01-25 NOTE — Telephone Encounter (Signed)
Message copied by Corwin Levins on Wed Jan 25, 2013  1:57 PM ------      Message from: Hillis Range      Created: Wed Jan 25, 2013  1:54 PM      Regarding: patient       Ms Cirrincione says that she is out of xanax.  She says she has called your office for refill but that no one will call her back.       Please have your staff follow-up with her if appropriate.  I do not refill these medicines.            Thanks      Fayrene Fearing ------

## 2013-01-25 NOTE — Addendum Note (Signed)
Addended by: Scharlene Gloss B on: 01/25/2013 02:59 PM   Modules accepted: Orders

## 2013-01-25 NOTE — Patient Instructions (Signed)
Remote monitoring is used to monitor your Pacemaker of ICD from home. This monitoring reduces the number of office visits required to check your device to one time per year. It allows [place] to keep an eye on the functioning of your device to ensure it is working properly. You are scheduled for a device check from home on 04/28/2013. You may send your transmission at any time that day. If you have a wireless device, the transmission will be sent automatically. After your physician reviews your transmission, you will receive a postcard with your next transmission date.  Your physician wants you to follow-up in: one year with Dr. Johney Frame.  You will receive a reminder letter in the mail two months in advance. If you don't receive a letter, please call our office to schedule the follow-up appointment.   Smoking cessation line: 1-800-QUIT-NOW  [phone])

## 2013-01-25 NOTE — Telephone Encounter (Signed)
Done hardcopy to robin  

## 2013-01-25 NOTE — Telephone Encounter (Signed)
Faxed hardcopy to [facility] [address] [place].  Informed the patient as well.  [facility] per patient request.

## 2013-02-13 ENCOUNTER — Other Ambulatory Visit: Payer: Self-pay | Admitting: Internal Medicine

## 2013-02-13 LAB — MDC_IDC_ENUM_SESS_TYPE_INCLINIC
Battery Remaining Longevity: 26 mo
Brady Statistic RA Percent Paced: 1.3 %
Brady Statistic RV Percent Paced: 99 %
Implantable Pulse Generator Serial Number: 709089
Lead Channel Impedance Value: 330 Ohm
Lead Channel Pacing Threshold Amplitude: 0.75 V
Lead Channel Pacing Threshold Amplitude: 0.75 V
Lead Channel Pacing Threshold Pulse Width: 0.4 ms
Lead Channel Pacing Threshold Pulse Width: 0.5 ms
Lead Channel Setting Pacing Amplitude: 2 V
Lead Channel Setting Pacing Amplitude: 2.25 V
Lead Channel Setting Pacing Pulse Width: 0.4 ms
Lead Channel Setting Pacing Pulse Width: 0.5 ms
Zone Setting Detection Interval: 350 ms

## 2013-02-27 ENCOUNTER — Encounter: Payer: Self-pay | Admitting: Internal Medicine

## 2013-03-22 ENCOUNTER — Other Ambulatory Visit: Payer: Self-pay | Admitting: Internal Medicine

## 2013-03-22 ENCOUNTER — Other Ambulatory Visit (INDEPENDENT_AMBULATORY_CARE_PROVIDER_SITE_OTHER): Payer: Medicare PPO

## 2013-03-22 ENCOUNTER — Encounter: Payer: Self-pay | Admitting: Internal Medicine

## 2013-03-22 ENCOUNTER — Ambulatory Visit (INDEPENDENT_AMBULATORY_CARE_PROVIDER_SITE_OTHER): Payer: Medicare PPO | Admitting: Internal Medicine

## 2013-03-22 VITALS — BP 142/90 | HR 78 | Temp 98.1°F | Ht 61.0 in | Wt 158.2 lb

## 2013-03-22 DIAGNOSIS — G8929 Other chronic pain: Secondary | ICD-10-CM

## 2013-03-22 DIAGNOSIS — I10 Essential (primary) hypertension: Secondary | ICD-10-CM

## 2013-03-22 DIAGNOSIS — Z0000 Encounter for general adult medical examination without abnormal findings: Secondary | ICD-10-CM

## 2013-03-22 DIAGNOSIS — E119 Type 2 diabetes mellitus without complications: Secondary | ICD-10-CM

## 2013-03-22 DIAGNOSIS — M545 Low back pain, unspecified: Secondary | ICD-10-CM

## 2013-03-22 DIAGNOSIS — E785 Hyperlipidemia, unspecified: Secondary | ICD-10-CM

## 2013-03-22 LAB — BASIC METABOLIC PANEL
BUN: 14 mg/dL (ref 6–23)
CO2: 28 mEq/L (ref 19–32)
Calcium: 9.5 mg/dL (ref 8.4–10.5)
Chloride: 102 mEq/L (ref 96–112)
Creatinine, Ser: 0.7 mg/dL (ref 0.4–1.2)
GFR: 90.57 mL/min (ref >60.00)
Glucose, Bld: 118 mg/dL — ABNORMAL HIGH (ref 70–99)
POTASSIUM: 4.3 meq/L (ref 3.5–5.1)
SODIUM: 136 meq/L (ref 135–145)

## 2013-03-22 LAB — HEPATIC FUNCTION PANEL
ALBUMIN: 4.1 g/dL (ref 3.5–5.2)
ALK PHOS: 48 U/L (ref 39–117)
ALT: 20 U/L (ref 0–35)
AST: 22 U/L (ref 0–37)
BILIRUBIN DIRECT: 0.1 mg/dL (ref 0.0–0.3)
Total Bilirubin: 0.7 mg/dL (ref 0.3–1.2)
Total Protein: 7.6 g/dL (ref 6.0–8.3)

## 2013-03-22 LAB — LIPID PANEL
CHOL/HDL RATIO: 3
Cholesterol: 152 mg/dL (ref 0–200)
HDL: 49.8 mg/dL (ref >39.00)
LDL Cholesterol: 71 mg/dL (ref 0–99)
Triglycerides: 155 mg/dL — ABNORMAL HIGH (ref 0.0–149.0)
VLDL: 31 mg/dL (ref 0.0–40.0)

## 2013-03-22 LAB — HEMOGLOBIN A1C: HEMOGLOBIN A1C: 7.9 % — AB (ref 4.6–6.5)

## 2013-03-22 MED ORDER — TRAZODONE HCL 50 MG PO TABS: ORAL_TABLET | ORAL | Status: DC

## 2013-03-22 MED ORDER — SITAGLIPTIN PHOSPHATE 100 MG PO TABS: 100.0000 mg | ORAL_TABLET | Freq: Every day | ORAL | Status: DC

## 2013-03-22 MED ORDER — HYDROCODONE-ACETAMINOPHEN 10-325 MG PO TABS: ORAL_TABLET | ORAL | Status: DC

## 2013-03-22 MED ORDER — LEVOTHYROXINE SODIUM 50 MCG PO TABS: 50.0000 ug | ORAL_TABLET | Freq: Every day | ORAL | Status: DC

## 2013-03-22 MED ORDER — BUPROPION HCL ER (XL) 150 MG PO TB24
300.0000 mg | ORAL_TABLET | Freq: Every day | ORAL | Status: DC
Start: 2013-03-22 — End: 2014-04-26

## 2013-03-22 MED ORDER — GLIMEPIRIDE 4 MG PO TABS: 4.0000 mg | ORAL_TABLET | Freq: Two times a day (BID) | ORAL | Status: DC

## 2013-03-22 MED ORDER — PIOGLITAZONE HCL 45 MG PO TABS: 45.0000 mg | ORAL_TABLET | Freq: Every day | ORAL | Status: DC

## 2013-03-22 MED ORDER — OMEPRAZOLE 20 MG PO CPDR: 20.0000 mg | DELAYED_RELEASE_CAPSULE | Freq: Two times a day (BID) | ORAL | Status: DC

## 2013-03-22 MED ORDER — FLUTICASONE PROPIONATE 50 MCG/ACT NA SUSP: 2.0000 | Freq: Every day | NASAL | Status: DC

## 2013-03-22 MED ORDER — ATORVASTATIN CALCIUM 40 MG PO TABS: 40.0000 mg | ORAL_TABLET | Freq: Every day | ORAL | Status: DC

## 2013-03-22 MED ORDER — HYDROCODONE-HOMATROPINE 5-1.5 MG/5ML PO SYRP: 5.0000 mL | ORAL_SOLUTION | Freq: Four times a day (QID) | ORAL | Status: DC | PRN

## 2013-03-22 NOTE — Patient Instructions (Addendum)
Please continue all other medications as before, and refills have been done if requested. Please have the pharmacy call with any other refills you may need.  You will be contacted regarding the referral for: pain clinic  Please go to the LAB in the Basement (turn left off the elevator) for the tests to be done today You will be contacted by phone if any changes need to be made immediately.  Otherwise, you will receive a letter about your results with an explanation, but please check with MyChart first.  Please return in 6 months, or sooner if needed, with Lab testing done 3-5 days before

## 2013-03-22 NOTE — Progress Notes (Signed)
Pre-visit discussion using our clinic review tool. No additional management support is needed unless otherwise documented below in the visit note.  
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
## 2013-03-22 NOTE — Assessment & Plan Note (Signed)
stable overall by history and exam, recent data reviewed with pt, and pt to continue medical treatment as before,  to f/u any worsening symptoms or concerns le BP Readings from Last 3 Encounters:  03/22/13 142/90  01/25/13 140/77  01/03/13 132/72

## 2013-03-22 NOTE — Assessment & Plan Note (Signed)
stable overall by history and exam, recent data reviewed with pt, and pt to continue medical treatment as before,  to f/u any worsening symptoms or concerns, for med refills, refer pain management

## 2013-03-22 NOTE — Assessment & Plan Note (Signed)
stable overall by history and exam, recent data reviewed with pt, and pt to continue medical treatment as before,  to f/u any worsening symptoms or concerns Lab Results  Component Value Date   HGBA1C 7.1* 10/31/2012

## 2013-03-22 NOTE — Assessment & Plan Note (Signed)
stable overall by history and exam, recent data reviewed with pt, and pt to continue medical treatment as before,  to f/u any worsening symptoms or concerns Lab Results  Component Value Date   LDLCALC 79 10/31/2012

## 2013-03-24 ENCOUNTER — Encounter: Payer: Self-pay | Admitting: Internal Medicine

## 2013-04-28 ENCOUNTER — Encounter: Payer: Self-pay | Admitting: Internal Medicine

## 2013-04-28 ENCOUNTER — Ambulatory Visit (INDEPENDENT_AMBULATORY_CARE_PROVIDER_SITE_OTHER): Payer: Medicare PPO | Admitting: *Deleted

## 2013-04-28 DIAGNOSIS — I428 Other cardiomyopathies: Secondary | ICD-10-CM

## 2013-04-28 LAB — MDC_IDC_ENUM_SESS_TYPE_REMOTE
Battery Voltage: 2.54 V
Brady Statistic AP VS Percent: 1 %
Brady Statistic AS VS Percent: 1 %
Brady Statistic RA Percent Paced: 1 %
Date Time Interrogation Session: 20150220070414
HighPow Impedance: 75 Ohm
HighPow Impedance: 75 Ohm
Lead Channel Impedance Value: 330 Ohm
Lead Channel Impedance Value: 640 Ohm
Lead Channel Pacing Threshold Amplitude: 0.75 V
Lead Channel Pacing Threshold Amplitude: 0.75 V
Lead Channel Pacing Threshold Pulse Width: 0.4 ms
Lead Channel Pacing Threshold Pulse Width: 0.4 ms
Lead Channel Pacing Threshold Pulse Width: 0.5 ms
Lead Channel Sensing Intrinsic Amplitude: 2.1 mV
Lead Channel Setting Pacing Amplitude: 2 V
Lead Channel Setting Pacing Amplitude: 2 V
Lead Channel Setting Pacing Amplitude: 2.5 V
Lead Channel Setting Pacing Pulse Width: 0.4 ms
MDC IDC MSMT BATTERY REMAINING LONGEVITY: 14 mo
MDC IDC MSMT LEADCHNL LV IMPEDANCE VALUE: 460 Ohm
MDC IDC MSMT LEADCHNL RV PACING THRESHOLD AMPLITUDE: 0.75 V
MDC IDC MSMT LEADCHNL RV SENSING INTR AMPL: 12 mV
MDC IDC PG SERIAL: 709089
MDC IDC SET LEADCHNL LV PACING PULSEWIDTH: 0.5 ms
MDC IDC SET LEADCHNL RV SENSING SENSITIVITY: 0.3 mV
MDC IDC SET ZONE DETECTION INTERVAL: 300 ms
MDC IDC SET ZONE DETECTION INTERVAL: 350 ms
MDC IDC STAT BRADY AP VP PERCENT: 1 %
MDC IDC STAT BRADY AS VP PERCENT: 99 %

## 2013-05-02 ENCOUNTER — Ambulatory Visit: Payer: Medicare PPO | Admitting: Internal Medicine

## 2013-05-05 ENCOUNTER — Other Ambulatory Visit: Payer: Self-pay

## 2013-05-05 ENCOUNTER — Other Ambulatory Visit: Payer: Self-pay | Admitting: Internal Medicine

## 2013-05-05 MED ORDER — ALPRAZOLAM 1 MG PO TABS: ORAL_TABLET | ORAL | Status: DC

## 2013-05-05 NOTE — Telephone Encounter (Signed)
Done hardcopy to robin  

## 2013-05-05 NOTE — Telephone Encounter (Signed)
Faxed hardcopy to [facility]

## 2013-05-08 ENCOUNTER — Encounter: Payer: Self-pay | Admitting: *Deleted

## 2013-05-08 ENCOUNTER — Other Ambulatory Visit: Payer: Self-pay | Admitting: Internal Medicine

## 2013-06-17 ENCOUNTER — Other Ambulatory Visit: Payer: Self-pay | Admitting: Internal Medicine

## 2013-07-17 ENCOUNTER — Ambulatory Visit (INDEPENDENT_AMBULATORY_CARE_PROVIDER_SITE_OTHER): Payer: Medicare PPO | Admitting: Emergency Medicine

## 2013-07-17 VITALS — BP 126/74 | HR 93 | Temp 98.0°F | Resp 16 | Ht 59.0 in | Wt 160.0 lb

## 2013-07-17 DIAGNOSIS — S139XXA Sprain of joints and ligaments of unspecified parts of neck, initial encounter: Secondary | ICD-10-CM

## 2013-07-17 MED ORDER — HYDROCODONE-ACETAMINOPHEN 10-325 MG PO TABS: 1.0000 | ORAL_TABLET | Freq: Four times a day (QID) | ORAL | Status: DC | PRN

## 2013-07-17 MED ORDER — MELOXICAM 15 MG PO TABS: 15.0000 mg | ORAL_TABLET | Freq: Every day | ORAL | Status: DC

## 2013-07-17 MED ORDER — CYCLOBENZAPRINE HCL 5 MG PO TABS: 5.0000 mg | ORAL_TABLET | Freq: Three times a day (TID) | ORAL | Status: DC | PRN
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
## 2013-07-20 ENCOUNTER — Telehealth: Payer: Self-pay

## 2013-07-20 NOTE — Telephone Encounter (Signed)
PA needed for cyclobenzaprine. Completed on covermymeds and received approval, case # [id number]. Notified pharm.

## 2013-08-01 ENCOUNTER — Encounter: Payer: Self-pay | Admitting: Internal Medicine

## 2013-08-01 ENCOUNTER — Ambulatory Visit (INDEPENDENT_AMBULATORY_CARE_PROVIDER_SITE_OTHER): Payer: Medicare PPO | Admitting: *Deleted

## 2013-08-01 DIAGNOSIS — I428 Other cardiomyopathies: Secondary | ICD-10-CM

## 2013-08-01 LAB — MDC_IDC_ENUM_SESS_TYPE_REMOTE
Implantable Pulse Generator Serial Number: 709089
Lead Channel Sensing Intrinsic Amplitude: 12 mV
Lead Channel Sensing Intrinsic Amplitude: 2.3 mV
Lead Channel Setting Pacing Amplitude: 2 V
Lead Channel Setting Pacing Amplitude: 2.25 V
Lead Channel Setting Pacing Pulse Width: 0.4 ms
Lead Channel Setting Pacing Pulse Width: 0.5 ms
Lead Channel Setting Sensing Sensitivity: 0.3 mV
MDC IDC SET LEADCHNL RV PACING AMPLITUDE: 2.5 V
MDC IDC SET ZONE DETECTION INTERVAL: 300 ms
Zone Setting Detection Interval: 350 ms

## 2013-08-01 NOTE — Progress Notes (Signed)
Remote ICD transmission.   

## 2013-08-04 ENCOUNTER — Other Ambulatory Visit: Payer: Self-pay | Admitting: Internal Medicine

## 2013-08-04 NOTE — Telephone Encounter (Signed)
Faxed hardcopy to [facility]

## 2013-08-04 NOTE — Telephone Encounter (Signed)
Done hardcopy to robin  

## 2013-08-15 ENCOUNTER — Ambulatory Visit (INDEPENDENT_AMBULATORY_CARE_PROVIDER_SITE_OTHER): Payer: Medicare PPO | Admitting: Internal Medicine

## 2013-08-15 ENCOUNTER — Other Ambulatory Visit: Payer: Self-pay | Admitting: Internal Medicine

## 2013-08-15 ENCOUNTER — Encounter: Payer: Self-pay | Admitting: Internal Medicine

## 2013-08-15 VITALS — BP 110/72 | HR 94 | Temp 97.7°F | Ht 60.0 in | Wt 159.5 lb

## 2013-08-15 DIAGNOSIS — L02619 Cutaneous abscess of unspecified foot: Secondary | ICD-10-CM

## 2013-08-15 DIAGNOSIS — I10 Essential (primary) hypertension: Secondary | ICD-10-CM

## 2013-08-15 DIAGNOSIS — F3289 Other specified depressive episodes: Secondary | ICD-10-CM

## 2013-08-15 DIAGNOSIS — L03039 Cellulitis of unspecified toe: Secondary | ICD-10-CM

## 2013-08-15 DIAGNOSIS — G8929 Other chronic pain: Secondary | ICD-10-CM

## 2013-08-15 DIAGNOSIS — M545 Low back pain, unspecified: Secondary | ICD-10-CM

## 2013-08-15 DIAGNOSIS — L03031 Cellulitis of right toe: Secondary | ICD-10-CM

## 2013-08-15 DIAGNOSIS — F329 Major depressive disorder, single episode, unspecified: Secondary | ICD-10-CM

## 2013-08-15 MED ORDER — DOXYCYCLINE HYCLATE 100 MG PO TABS: 100.0000 mg | ORAL_TABLET | Freq: Two times a day (BID) | ORAL | Status: DC

## 2013-08-15 MED ORDER — FUROSEMIDE 20 MG PO TABS: 20.0000 mg | ORAL_TABLET | Freq: Every day | ORAL | Status: DC

## 2013-08-15 MED ORDER — DESOXIMETASONE 0.25 % EX CREA: TOPICAL_CREAM | CUTANEOUS | Status: DC

## 2013-08-15 MED ORDER — TRAMADOL HCL 50 MG PO TABS: 50.0000 mg | ORAL_TABLET | Freq: Three times a day (TID) | ORAL | Status: DC | PRN

## 2013-08-15 NOTE — Assessment & Plan Note (Signed)
Second toe right foot with paronychia/cellulitis at base of nail; for doxy course, pain control,  to f/u any worsening symptoms or concerns

## 2013-08-15 NOTE — Assessment & Plan Note (Signed)
stable overall by history and exam, and pt to continue medical treatment as before,  to f/u any worsening symptoms or concerns 

## 2013-08-15 NOTE — Assessment & Plan Note (Signed)
stable overall by history and exam, recent data reviewed with pt, and pt to continue medical treatment as before,  to f/u any worsening symptoms or concerns BP Readings from Last 3 Encounters:  08/15/13 110/72  07/17/13 126/74  03/22/13 142/90

## 2013-08-15 NOTE — Assessment & Plan Note (Signed)
Also for tramadol, refer pain management per pt request

## 2013-08-15 NOTE — Progress Notes (Signed)
Pre visit review using our clinic review tool, if applicable. No additional management support is needed unless otherwise documented below in the visit note. 

## 2013-08-15 NOTE — Patient Instructions (Addendum)
Please take all new medication as prescribed - the antibiotic, and the pain medication  Please continue all other medications as before, and refills have been done if requested - the lasix and the cream  Please have the pharmacy call with any other refills you may need.  Please keep your appointments with your specialists as you may have planned  You will be contacted regarding the referral for: pain clinic
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
## 2013-08-18 ENCOUNTER — Encounter: Payer: Self-pay | Admitting: Cardiology

## 2013-08-30 ENCOUNTER — Other Ambulatory Visit: Payer: Self-pay | Admitting: Internal Medicine

## 2013-09-15 ENCOUNTER — Other Ambulatory Visit: Payer: Self-pay | Admitting: Internal Medicine

## 2013-09-18 ENCOUNTER — Other Ambulatory Visit (INDEPENDENT_AMBULATORY_CARE_PROVIDER_SITE_OTHER): Payer: Medicare PPO

## 2013-09-18 DIAGNOSIS — Z0000 Encounter for general adult medical examination without abnormal findings: Secondary | ICD-10-CM

## 2013-09-18 DIAGNOSIS — E119 Type 2 diabetes mellitus without complications: Secondary | ICD-10-CM

## 2013-09-18 LAB — LIPID PANEL
CHOLESTEROL: 143 mg/dL (ref 0–200)
HDL: 47.9 mg/dL (ref >39.00)
LDL CALC: 69 mg/dL (ref 0–99)
NonHDL: 95.1
TRIGLYCERIDES: 131 mg/dL (ref 0.0–149.0)
Total CHOL/HDL Ratio: 3
VLDL: 26.2 mg/dL (ref 0.0–40.0)

## 2013-09-18 LAB — HEPATIC FUNCTION PANEL
ALBUMIN: 4 g/dL (ref 3.5–5.2)
ALT: 32 U/L (ref 0–35)
AST: 33 U/L (ref 0–37)
Alkaline Phosphatase: 44 U/L (ref 39–117)
Bilirubin, Direct: 0.1 mg/dL (ref 0.0–0.3)
Total Bilirubin: 0.7 mg/dL (ref 0.2–1.2)
Total Protein: 7.4 g/dL (ref 6.0–8.3)

## 2013-09-18 LAB — CBC WITH DIFFERENTIAL/PLATELET
BASOS ABS: 0 10*3/uL (ref 0.0–0.1)
Basophils Relative: 0.1 % (ref 0.0–3.0)
Eosinophils Absolute: 0.1 10*3/uL (ref 0.0–0.7)
Eosinophils Relative: 1.2 % (ref 0.0–5.0)
HEMATOCRIT: 36.6 % (ref 36.0–46.0)
Hemoglobin: 11.9 g/dL — ABNORMAL LOW (ref 12.0–15.0)
LYMPHS ABS: 3.7 10*3/uL (ref 0.7–4.0)
Lymphocytes Relative: 35.8 % (ref 12.0–46.0)
MCHC: 32.4 g/dL (ref 30.0–36.0)
MCV: 91.2 fl (ref 78.0–100.0)
MONO ABS: 0.8 10*3/uL (ref 0.1–1.0)
Monocytes Relative: 7.7 % (ref 3.0–12.0)
Neutro Abs: 5.7 10*3/uL (ref 1.4–7.7)
Neutrophils Relative %: 55.2 % (ref 43.0–77.0)
PLATELETS: 259 10*3/uL (ref 150.0–400.0)
RBC: 4.02 Mil/uL (ref 3.87–5.11)
RDW: 14.2 % (ref 11.5–15.5)
WBC: 10.3 10*3/uL (ref 4.0–10.5)

## 2013-09-18 LAB — URINALYSIS, ROUTINE W REFLEX MICROSCOPIC
BILIRUBIN URINE: NEGATIVE
Hgb urine dipstick: NEGATIVE
Ketones, ur: NEGATIVE
Leukocytes, UA: NEGATIVE
Nitrite: NEGATIVE
Specific Gravity, Urine: 1.01 (ref 1.000–1.030)
Total Protein, Urine: NEGATIVE
Urine Glucose: NEGATIVE
Urobilinogen, UA: 0.2 (ref 0.0–1.0)
pH: 6 (ref 5.0–8.0)

## 2013-09-18 LAB — BASIC METABOLIC PANEL
BUN: 10 mg/dL (ref 6–23)
CHLORIDE: 106 meq/L (ref 96–112)
CO2: 26 mEq/L (ref 19–32)
Calcium: 9.1 mg/dL (ref 8.4–10.5)
Creatinine, Ser: 0.6 mg/dL (ref 0.4–1.2)
GFR: 104.49 mL/min (ref >60.00)
GLUCOSE: 151 mg/dL — AB (ref 70–99)
POTASSIUM: 3.8 meq/L (ref 3.5–5.1)
Sodium: 140 mEq/L (ref 135–145)

## 2013-09-18 LAB — HEMOGLOBIN A1C: Hgb A1c MFr Bld: 9.3 % — ABNORMAL HIGH (ref 4.6–6.5)

## 2013-09-18 LAB — MICROALBUMIN / CREATININE URINE RATIO
Creatinine,U: 75 mg/dL
MICROALB/CREAT RATIO: 2.5 mg/g (ref 0.0–30.0)
Microalb, Ur: 1.9 mg/dL (ref 0.0–1.9)

## 2013-09-18 LAB — TSH: TSH: 1.4 u[IU]/mL (ref 0.35–4.50)

## 2013-09-19 ENCOUNTER — Ambulatory Visit (INDEPENDENT_AMBULATORY_CARE_PROVIDER_SITE_OTHER): Payer: Medicare PPO | Admitting: Internal Medicine

## 2013-09-19 ENCOUNTER — Encounter: Payer: Self-pay | Admitting: Internal Medicine

## 2013-09-19 VITALS — BP 144/82 | HR 82 | Temp 98.2°F | Wt 158.1 lb

## 2013-09-19 DIAGNOSIS — IMO0001 Reserved for inherently not codable concepts without codable children: Secondary | ICD-10-CM

## 2013-09-19 DIAGNOSIS — Z0000 Encounter for general adult medical examination without abnormal findings: Secondary | ICD-10-CM

## 2013-09-19 DIAGNOSIS — E1165 Type 2 diabetes mellitus with hyperglycemia: Secondary | ICD-10-CM

## 2013-09-19 DIAGNOSIS — E119 Type 2 diabetes mellitus without complications: Secondary | ICD-10-CM

## 2013-09-19 NOTE — Patient Instructions (Addendum)
You had the new Prevnar pneumonia shot today  Plesae take your diabetes medications at about noon since this is your begger meal.  Please continue all other medications as before, and refills have been done if requested.  Please have the pharmacy call with any other refills you may need.  Please continue your efforts at being more active, low cholesterol diet, and weight control.  You are otherwise up to date with prevention measures today.  Please keep your appointments with your specialists as you may have planned  Please return in 6 months, or sooner if needed, with Lab testing done 3-5 days ahead
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
## 2013-09-19 NOTE — Assessment & Plan Note (Signed)

## 2013-09-19 NOTE — Progress Notes (Signed)
Pre visit review using our clinic review tool, if applicable. No additional management support is needed unless otherwise documented below in the visit note. 

## 2013-09-19 NOTE — Assessment & Plan Note (Signed)
Mild uncontrolled, but stable overall by history and exam, recent data reviewed with pt, and pt to continue medical treatment as before,  to f/u any worsening symptoms or concerns, declines insulins start at this time Lab Results  Component Value Date   HGBA1C 9.3* 09/18/2013

## 2013-09-20 ENCOUNTER — Telehealth: Payer: Self-pay | Admitting: Internal Medicine

## 2013-09-20 NOTE — Telephone Encounter (Signed)
Relevant patient education mailed to patient.  

## 2013-09-22 ENCOUNTER — Other Ambulatory Visit: Payer: Self-pay | Admitting: Emergency Medicine

## 2013-10-15 ENCOUNTER — Other Ambulatory Visit: Payer: Self-pay | Admitting: Internal Medicine

## 2013-11-06 ENCOUNTER — Encounter: Payer: Self-pay | Admitting: Internal Medicine

## 2013-11-06 ENCOUNTER — Ambulatory Visit (INDEPENDENT_AMBULATORY_CARE_PROVIDER_SITE_OTHER): Payer: Medicare PPO | Admitting: *Deleted

## 2013-11-06 ENCOUNTER — Telehealth: Payer: Self-pay | Admitting: Cardiology

## 2013-11-06 DIAGNOSIS — I428 Other cardiomyopathies: Secondary | ICD-10-CM

## 2013-11-06 LAB — MDC_IDC_ENUM_SESS_TYPE_REMOTE
Battery Remaining Longevity: 14 mo
Battery Voltage: 2.54 V
Brady Statistic AP VP Percent: 1 %
Brady Statistic RA Percent Paced: 1 %
Date Time Interrogation Session: 20150831060013
HIGH POWER IMPEDANCE MEASURED VALUE: 75 Ohm
HIGH POWER IMPEDANCE MEASURED VALUE: 75 Ohm
Lead Channel Impedance Value: 550 Ohm
Lead Channel Impedance Value: 710 Ohm
Lead Channel Pacing Threshold Amplitude: 0.75 V
Lead Channel Pacing Threshold Amplitude: 0.75 V
Lead Channel Pacing Threshold Pulse Width: 0.4 ms
Lead Channel Sensing Intrinsic Amplitude: 12 mV
Lead Channel Setting Pacing Amplitude: 2 V
Lead Channel Setting Pacing Amplitude: 2.5 V
Lead Channel Setting Pacing Pulse Width: 0.4 ms
Lead Channel Setting Pacing Pulse Width: 0.5 ms
Lead Channel Setting Sensing Sensitivity: 0.3 mV
MDC IDC MSMT LEADCHNL LV PACING THRESHOLD AMPLITUDE: 0.75 V
MDC IDC MSMT LEADCHNL LV PACING THRESHOLD PULSEWIDTH: 0.5 ms
MDC IDC MSMT LEADCHNL RA IMPEDANCE VALUE: 350 Ohm
MDC IDC MSMT LEADCHNL RA SENSING INTR AMPL: 2.3 mV
MDC IDC MSMT LEADCHNL RV PACING THRESHOLD PULSEWIDTH: 0.4 ms
MDC IDC PG SERIAL: 709089
MDC IDC SET LEADCHNL RA PACING AMPLITUDE: 2 V
MDC IDC STAT BRADY AP VS PERCENT: 1 %
MDC IDC STAT BRADY AS VP PERCENT: 99 %
MDC IDC STAT BRADY AS VS PERCENT: 1 %
Zone Setting Detection Interval: 300 ms
Zone Setting Detection Interval: 350 ms

## 2013-11-06 NOTE — Progress Notes (Signed)
Remote ICD transmission.   

## 2013-11-06 NOTE — Telephone Encounter (Signed)
Spoke with pt and reminded pt of remote transmission that is due today. Pt verbalized understanding.   

## 2013-11-14 ENCOUNTER — Other Ambulatory Visit: Payer: Self-pay | Admitting: Internal Medicine

## 2013-11-14 NOTE — Telephone Encounter (Signed)
Done hardcopy to robin  

## 2013-11-15 ENCOUNTER — Encounter: Payer: Self-pay | Admitting: Cardiology

## 2013-11-15 NOTE — Telephone Encounter (Signed)
Faxed hardcopy for Tramadol and Alprazolam to [facility] [address] [place]

## 2013-12-05 ENCOUNTER — Other Ambulatory Visit: Payer: Self-pay

## 2013-12-05 MED ORDER — DESOXIMETASONE 0.25 % EX CREA: TOPICAL_CREAM | CUTANEOUS | Status: DC

## 2013-12-29 ENCOUNTER — Ambulatory Visit (INDEPENDENT_AMBULATORY_CARE_PROVIDER_SITE_OTHER): Payer: Medicare PPO

## 2013-12-29 DIAGNOSIS — Z23 Encounter for immunization: Secondary | ICD-10-CM

## 2014-01-28 ENCOUNTER — Other Ambulatory Visit: Payer: Self-pay | Admitting: Internal Medicine

## 2014-01-29 ENCOUNTER — Other Ambulatory Visit: Payer: Self-pay | Admitting: Internal Medicine

## 2014-02-11 ENCOUNTER — Other Ambulatory Visit: Payer: Self-pay | Admitting: Internal Medicine

## 2014-02-13 NOTE — Telephone Encounter (Signed)
Done hardcopy to MM

## 2014-02-14 ENCOUNTER — Other Ambulatory Visit: Payer: Self-pay | Admitting: Internal Medicine

## 2014-02-14 NOTE — Telephone Encounter (Signed)
meds already refilled dec 8

## 2014-02-15 ENCOUNTER — Ambulatory Visit: Payer: Medicare PPO | Admitting: Cardiovascular Disease

## 2014-02-28 ENCOUNTER — Encounter: Payer: Self-pay | Admitting: Internal Medicine

## 2014-02-28 ENCOUNTER — Ambulatory Visit (INDEPENDENT_AMBULATORY_CARE_PROVIDER_SITE_OTHER): Payer: Medicare PPO | Admitting: Internal Medicine

## 2014-02-28 VITALS — BP 112/82 | HR 77 | Ht 60.0 in | Wt 161.2 lb

## 2014-02-28 DIAGNOSIS — I429 Cardiomyopathy, unspecified: Secondary | ICD-10-CM

## 2014-02-28 DIAGNOSIS — I10 Essential (primary) hypertension: Secondary | ICD-10-CM

## 2014-02-28 DIAGNOSIS — I519 Heart disease, unspecified: Secondary | ICD-10-CM

## 2014-02-28 LAB — MDC_IDC_ENUM_SESS_TYPE_INCLINIC
Battery Remaining Longevity: 14.4 mo
Battery Voltage: 2.54 V
Brady Statistic RA Percent Paced: 0.4 %
Date Time Interrogation Session: 20151223163804
HighPow Impedance: 72 Ohm
Implantable Pulse Generator Serial Number: 709089
Lead Channel Impedance Value: 337.5 Ohm
Lead Channel Impedance Value: 662.5 Ohm
Lead Channel Pacing Threshold Amplitude: 0.75 V
Lead Channel Pacing Threshold Pulse Width: 0.4 ms
Lead Channel Pacing Threshold Pulse Width: 0.4 ms
Lead Channel Pacing Threshold Pulse Width: 0.5 ms
Lead Channel Sensing Intrinsic Amplitude: 1.9 mV
Lead Channel Setting Pacing Amplitude: 2 V
Lead Channel Setting Pacing Amplitude: 2.5 V
Lead Channel Setting Pacing Pulse Width: 0.4 ms
Lead Channel Setting Pacing Pulse Width: 0.5 ms
Lead Channel Setting Sensing Sensitivity: 0.3 mV
MDC IDC MSMT LEADCHNL LV IMPEDANCE VALUE: 525 Ohm
MDC IDC MSMT LEADCHNL LV PACING THRESHOLD AMPLITUDE: 1 V
MDC IDC MSMT LEADCHNL RA PACING THRESHOLD AMPLITUDE: 0.75 V
MDC IDC MSMT LEADCHNL RV SENSING INTR AMPL: 12 mV
MDC IDC SET LEADCHNL RA PACING AMPLITUDE: 2 V
MDC IDC STAT BRADY RV PERCENT PACED: 99.96 %
Zone Setting Detection Interval: 300 ms
Zone Setting Detection Interval: 350 ms

## 2014-02-28 NOTE — Patient Instructions (Signed)
Your physician wants you to follow-up in: 12 months with Dr. Rayann Heman. You will receive a reminder letter in the mail two months in advance. If you don't receive a letter, please call our office to schedule the follow-up appointment.  Remote monitoring is used to monitor your Pacemaker or ICD from home. This monitoring reduces the number of office visits required to check your device to one time per year. It allows [place] to keep an eye on the functioning of your device to ensure it is working properly. You are scheduled for a device check from home on 05/29/2013. You may send your transmission at any time that day. If you have a wireless device, the transmission will be sent automatically. After your physician reviews your transmission, you will receive a postcard with your next transmission date.
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
## 2014-03-08 ENCOUNTER — Encounter: Payer: Self-pay | Admitting: Cardiovascular Disease

## 2014-03-08 ENCOUNTER — Ambulatory Visit (INDEPENDENT_AMBULATORY_CARE_PROVIDER_SITE_OTHER): Payer: Medicare PPO | Admitting: Cardiovascular Disease

## 2014-03-08 ENCOUNTER — Ambulatory Visit: Payer: Medicare PPO | Admitting: Cardiovascular Disease

## 2014-03-08 VITALS — BP 140/80 | HR 76 | Resp 12 | Ht 59.0 in | Wt 157.8 lb

## 2014-03-08 DIAGNOSIS — Z720 Tobacco use: Secondary | ICD-10-CM

## 2014-03-08 DIAGNOSIS — I429 Cardiomyopathy, unspecified: Secondary | ICD-10-CM

## 2014-03-08 DIAGNOSIS — I519 Heart disease, unspecified: Secondary | ICD-10-CM

## 2014-03-08 DIAGNOSIS — I428 Other cardiomyopathies: Secondary | ICD-10-CM

## 2014-03-08 DIAGNOSIS — F17200 Nicotine dependence, unspecified, uncomplicated: Secondary | ICD-10-CM

## 2014-03-08 DIAGNOSIS — I10 Essential (primary) hypertension: Secondary | ICD-10-CM

## 2014-03-08 NOTE — Patient Instructions (Signed)
Your physician wants you to follow-up in:  6 months. You will receive a reminder letter in the mail two months in advance. If you don't receive a letter, please call our office to schedule the follow-up appointment.   
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
## 2014-03-18 ENCOUNTER — Other Ambulatory Visit: Payer: Self-pay | Admitting: Internal Medicine

## 2014-03-25 ENCOUNTER — Other Ambulatory Visit: Payer: Self-pay | Admitting: Internal Medicine

## 2014-03-26 ENCOUNTER — Other Ambulatory Visit (INDEPENDENT_AMBULATORY_CARE_PROVIDER_SITE_OTHER): Payer: Medicare PPO

## 2014-03-26 DIAGNOSIS — E1165 Type 2 diabetes mellitus with hyperglycemia: Secondary | ICD-10-CM

## 2014-03-26 DIAGNOSIS — IMO0002 Reserved for concepts with insufficient information to code with codable children: Secondary | ICD-10-CM

## 2014-03-26 LAB — BASIC METABOLIC PANEL
BUN: 11 mg/dL (ref 6–23)
CALCIUM: 9 mg/dL (ref 8.4–10.5)
CO2: 30 mEq/L (ref 19–32)
Chloride: 99 mEq/L (ref 96–112)
Creatinine, Ser: 0.7 mg/dL (ref 0.40–1.20)
GFR: 87.34 mL/min (ref >60.00)
Glucose, Bld: 220 mg/dL — ABNORMAL HIGH (ref 70–99)
POTASSIUM: 4.2 meq/L (ref 3.5–5.1)
Sodium: 135 mEq/L (ref 135–145)

## 2014-03-26 LAB — HEPATIC FUNCTION PANEL
ALBUMIN: 4.1 g/dL (ref 3.5–5.2)
ALK PHOS: 51 U/L (ref 39–117)
ALT: 43 U/L — AB (ref 0–35)
AST: 42 U/L — ABNORMAL HIGH (ref 0–37)
BILIRUBIN DIRECT: 0.2 mg/dL (ref 0.0–0.3)
TOTAL PROTEIN: 7.2 g/dL (ref 6.0–8.3)
Total Bilirubin: 0.5 mg/dL (ref 0.2–1.2)

## 2014-03-26 LAB — LIPID PANEL
CHOL/HDL RATIO: 4
Cholesterol: 147 mg/dL (ref 0–200)
HDL: 38.7 mg/dL — AB (ref >39.00)
LDL CALC: 74 mg/dL (ref 0–99)
NonHDL: 108.3
TRIGLYCERIDES: 173 mg/dL — AB (ref 0.0–149.0)
VLDL: 34.6 mg/dL (ref 0.0–40.0)

## 2014-03-26 LAB — HEMOGLOBIN A1C: Hgb A1c MFr Bld: 10 % — ABNORMAL HIGH (ref 4.6–6.5)

## 2014-03-27 ENCOUNTER — Encounter: Payer: Self-pay | Admitting: Internal Medicine

## 2014-03-27 ENCOUNTER — Ambulatory Visit (INDEPENDENT_AMBULATORY_CARE_PROVIDER_SITE_OTHER): Payer: Medicare PPO | Admitting: Internal Medicine

## 2014-03-27 VITALS — BP 112/70 | HR 84 | Temp 98.0°F | Ht 59.0 in | Wt 158.8 lb

## 2014-03-27 DIAGNOSIS — J069 Acute upper respiratory infection, unspecified: Secondary | ICD-10-CM | POA: Insufficient documentation

## 2014-03-27 DIAGNOSIS — E119 Type 2 diabetes mellitus without complications: Secondary | ICD-10-CM

## 2014-03-27 DIAGNOSIS — I10 Essential (primary) hypertension: Secondary | ICD-10-CM

## 2014-03-27 MED ORDER — INSULIN GLARGINE 100 UNIT/ML SOLOSTAR PEN: PEN_INJECTOR | SUBCUTANEOUS | Status: DC

## 2014-03-27 MED ORDER — INSULIN PEN NEEDLE 32G X 4 MM MISC: Status: DC

## 2014-03-27 MED ORDER — HYDROCODONE-HOMATROPINE 5-1.5 MG/5ML PO SYRP: 5.0000 mL | ORAL_SOLUTION | Freq: Four times a day (QID) | ORAL | Status: DC | PRN

## 2014-03-27 MED ORDER — LEVOFLOXACIN 250 MG PO TABS: 250.0000 mg | ORAL_TABLET | Freq: Every day | ORAL | Status: DC
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
## 2014-03-27 NOTE — Assessment & Plan Note (Signed)
Mild to mod, for antibx course,  to f/u any worsening symptoms or concerns 

## 2014-03-27 NOTE — Assessment & Plan Note (Signed)
stable overall by history and exam, recent data reviewed with pt, and pt to continue medical treatment as before,  to f/u any worsening symptoms or concerns  BP Readings from Last 3 Encounters:  03/27/14 112/70  03/08/14 140/80  02/28/14 112/82

## 2014-03-27 NOTE — Assessment & Plan Note (Signed)
Uncontrolled, to d/c the glimeparide, refer DM education, start lantus at 10 units per day and call with sugars in 1 wk, cont all other meds, and DM diet, consider endo referral

## 2014-03-27 NOTE — Progress Notes (Signed)
Pre visit review using our clinic review tool, if applicable. No additional management support is needed unless otherwise documented below in the visit note. 

## 2014-03-27 NOTE — Patient Instructions (Signed)
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)  Please take all new medication as prescribed - the antibiotic and the cough medicine  Please take all new medication as prescribed  - start insulin with once per day shot of the Toujeo sample at 10 units per day (to take at about the same every day)  When the toujeo sample is done (or even now), Please take all new medication as prescribed - the Lantus at the same 10 units per day  You will be contacted regarding the referral for: Diabetes education  Please check your blood sugars at least 3 times per day for the next week and call with the results as you may be able to increase the insulin at that time  Please continue all other medications as before  Please have the pharmacy call with any other refills you may need.  Please continue your efforts at being more active, low cholesterol diabetic diet, and weight control.  Please keep your appointments with your specialists as you may have planned  Please return in 3 months, or sooner if needed, with Lab testing done 3-5 days before

## 2014-04-05 ENCOUNTER — Other Ambulatory Visit: Payer: Self-pay | Admitting: Internal Medicine

## 2014-04-19 ENCOUNTER — Other Ambulatory Visit: Payer: Self-pay | Admitting: Cardiovascular Disease

## 2014-04-20 ENCOUNTER — Other Ambulatory Visit: Payer: Self-pay

## 2014-04-20 MED ORDER — LISINOPRIL 20 MG PO TABS: 20.0000 mg | ORAL_TABLET | Freq: Every day | ORAL | Status: DC

## 2014-04-20 MED ORDER — METOPROLOL TARTRATE 25 MG PO TABS: 25.0000 mg | ORAL_TABLET | Freq: Two times a day (BID) | ORAL | Status: DC

## 2014-04-21 ENCOUNTER — Other Ambulatory Visit: Payer: Self-pay | Admitting: Internal Medicine

## 2014-04-23 ENCOUNTER — Other Ambulatory Visit: Payer: Self-pay | Admitting: Nurse Practitioner

## 2014-04-26 ENCOUNTER — Other Ambulatory Visit: Payer: Self-pay | Admitting: Internal Medicine

## 2014-04-26 NOTE — Telephone Encounter (Signed)
Done erx 

## 2014-05-01 ENCOUNTER — Encounter: Payer: Medicare PPO | Attending: Internal Medicine | Admitting: Dietician

## 2014-05-01 ENCOUNTER — Encounter: Payer: Self-pay | Admitting: Dietician

## 2014-05-01 VITALS — Ht 61.0 in | Wt 158.0 lb

## 2014-05-01 DIAGNOSIS — E119 Type 2 diabetes mellitus without complications: Secondary | ICD-10-CM | POA: Diagnosis not present

## 2014-05-01 DIAGNOSIS — Z713 Dietary counseling and surveillance: Secondary | ICD-10-CM | POA: Diagnosis not present

## 2014-05-01 NOTE — Patient Instructions (Addendum)
Walk 15 minutes 4-5 days per week as tolerated. Bake or broil rather than fried Aim for 2 Carb Choices per meal (30 grams) +/- 1 either way  Aim for 0-2 Carbs per snack if hungry  Include protein in moderation with your meals and snacks Consider checking your blood sugar 3 times per day as recommended by your doctor.  (before breakfast and 2 hours after dinner)

## 2014-05-01 NOTE — Progress Notes (Signed)
  Medical Nutrition Therapy:  Appt start time: 2820 end time:  1700.   Assessment:  Primary concerns today: Patient is here alone and wants to know what to eat.  States that somedays she is hungry because she doesn't know what to eat and just skips meals.  Patient with hx of DM since 2008-06-13. UBW 120 lbs in Jun 13, 2008.  Mother died of complications of DM and patient took care of her.    Patient checks her blood sugar once a day before breakfast.  It is usually >210 according to her records brought to her appointment. Patient brought record of blood sugars and foods consumed.    HgbA1c 10% 03/26/14 increased from 7.9% 1 year ago.  Preferred Learning Style:   No preference indicated  Learning Readiness:   Ready  Change in progress  MEDICATIONS: see list.  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   Nutritional Diagnosis:  NB-1.1 Food and nutrition-related knowledge deficit As related to balance of carbohydrates, protein, and fat.  As evidenced by patient report and diet hx..    Intervention:  Nutrition counseling and  diabetes education initiated. Discussed Carb Counting by food group as method of portion control, reading food labels, and benefits of increased activity. Also discussed basic physiology of Diabetes, target BG ranges pre and post meals, and A1c.  . Walk 15 minutes 4-5 days per week as tolerated. Baked or broiled rather than fried Aim for 2 Carb Choices per meal (30 grams) +/- 1 either way  Aim for 0-2 Carbs per snack if hungry  Include protein in moderation with your meals and snacks Consider checking your blood sugar 3 times per day as recommended by your doctor.  (before breakfast and 2 hours after dinner)  Teaching Method Utilized:  Visual Auditory  Handouts given during visit include:  My Plate  Yellow meal plan card  Snack list  Label reading  HgbA1C sheet with CBG goals  Barriers to learning/adherence to lifestyle change: none  Demonstrated degree of understanding via:  Teach Back   Monitoring/Evaluation:  Dietary intake, exercise, label reading, and body weight in 2 month(s).

## 2014-05-22 ENCOUNTER — Other Ambulatory Visit: Payer: Self-pay | Admitting: Internal Medicine

## 2014-05-23 NOTE — Telephone Encounter (Signed)
Done hardcopy to Cherina  

## 2014-05-23 NOTE — Telephone Encounter (Signed)
Rx done. 

## 2014-05-30 ENCOUNTER — Telehealth: Payer: Self-pay | Admitting: Internal Medicine

## 2014-05-30 ENCOUNTER — Ambulatory Visit (INDEPENDENT_AMBULATORY_CARE_PROVIDER_SITE_OTHER): Payer: Medicare PPO | Admitting: *Deleted

## 2014-05-30 DIAGNOSIS — I428 Other cardiomyopathies: Secondary | ICD-10-CM

## 2014-05-30 DIAGNOSIS — I429 Cardiomyopathy, unspecified: Secondary | ICD-10-CM

## 2014-05-30 LAB — MDC_IDC_ENUM_SESS_TYPE_REMOTE
Battery Remaining Longevity: 9 mo
Battery Voltage: 2.53 V
Brady Statistic AP VP Percent: 1 %
Brady Statistic AP VS Percent: 1 %
Brady Statistic AS VP Percent: 99 %
Brady Statistic AS VS Percent: 1 %
Brady Statistic RA Percent Paced: 1 %
Date Time Interrogation Session: 20160323071800
HIGH POWER IMPEDANCE MEASURED VALUE: 77 Ohm
HighPow Impedance: 77 Ohm
Implantable Pulse Generator Serial Number: 709089
Lead Channel Impedance Value: 340 Ohm
Lead Channel Pacing Threshold Amplitude: 0.75 V
Lead Channel Pacing Threshold Amplitude: 1 V
Lead Channel Pacing Threshold Pulse Width: 0.4 ms
Lead Channel Pacing Threshold Pulse Width: 0.5 ms
Lead Channel Sensing Intrinsic Amplitude: 12 mV
Lead Channel Sensing Intrinsic Amplitude: 2.1 mV
Lead Channel Setting Pacing Amplitude: 2 V
Lead Channel Setting Pacing Amplitude: 2.5 V
Lead Channel Setting Pacing Pulse Width: 0.4 ms
Lead Channel Setting Pacing Pulse Width: 0.5 ms
Lead Channel Setting Sensing Sensitivity: 0.3 mV
MDC IDC MSMT LEADCHNL LV IMPEDANCE VALUE: 550 Ohm
MDC IDC MSMT LEADCHNL RA PACING THRESHOLD PULSEWIDTH: 0.4 ms
MDC IDC MSMT LEADCHNL RV IMPEDANCE VALUE: 660 Ohm
MDC IDC MSMT LEADCHNL RV PACING THRESHOLD AMPLITUDE: 0.75 V
MDC IDC SET LEADCHNL RA PACING AMPLITUDE: 2 V
Zone Setting Detection Interval: 300 ms
Zone Setting Detection Interval: 350 ms

## 2014-05-30 NOTE — Telephone Encounter (Signed)
Spoke w/ pt and informed her that transmission was received.

## 2014-05-30 NOTE — Progress Notes (Signed)
Remote ICD transmission.   

## 2014-05-30 NOTE — Telephone Encounter (Signed)
New Message  Pt wanted to speak w/ device clinic to see if remote defib check was successful. Please call back and discuss.

## 2014-06-12 ENCOUNTER — Telehealth: Payer: Self-pay | Admitting: Cardiology

## 2014-06-12 ENCOUNTER — Telehealth: Payer: Self-pay | Admitting: Internal Medicine

## 2014-06-12 NOTE — Telephone Encounter (Signed)
Pt enrolled in ICM clinic. 1st transmission 07-12-14

## 2014-06-12 NOTE — Telephone Encounter (Signed)
New problem    Pt is returning Barbara's calling. Please call pt.

## 2014-06-12 NOTE — Telephone Encounter (Signed)
LMOVM for pt to return call in regards to ICM clinic.  

## 2014-06-12 NOTE — Telephone Encounter (Signed)
-----   Message from Thompson Grayer, MD sent at 06/11/2014 10:32 PM EDT ----- Please enroll in [facility] clinic with Heather and schedule

## 2014-06-14 ENCOUNTER — Encounter: Payer: Self-pay | Admitting: Cardiology

## 2014-06-19 ENCOUNTER — Encounter: Payer: Self-pay | Admitting: Internal Medicine

## 2014-06-25 ENCOUNTER — Other Ambulatory Visit (INDEPENDENT_AMBULATORY_CARE_PROVIDER_SITE_OTHER): Payer: Medicare PPO

## 2014-06-25 DIAGNOSIS — E119 Type 2 diabetes mellitus without complications: Secondary | ICD-10-CM

## 2014-06-25 LAB — HEPATIC FUNCTION PANEL
ALBUMIN: 4.3 g/dL (ref 3.5–5.2)
ALK PHOS: 50 U/L (ref 39–117)
ALT: 35 U/L (ref 0–35)
AST: 34 U/L (ref 0–37)
Bilirubin, Direct: 0.1 mg/dL (ref 0.0–0.3)
TOTAL PROTEIN: 7.1 g/dL (ref 6.0–8.3)
Total Bilirubin: 0.6 mg/dL (ref 0.2–1.2)

## 2014-06-25 LAB — LIPID PANEL
CHOL/HDL RATIO: 3
CHOLESTEROL: 156 mg/dL (ref 0–200)
HDL: 45.8 mg/dL (ref >39.00)
LDL CALC: 85 mg/dL (ref 0–99)
NonHDL: 110.2
TRIGLYCERIDES: 125 mg/dL (ref 0.0–149.0)
VLDL: 25 mg/dL (ref 0.0–40.0)

## 2014-06-25 LAB — BASIC METABOLIC PANEL
BUN: 10 mg/dL (ref 6–23)
CHLORIDE: 101 meq/L (ref 96–112)
CO2: 28 meq/L (ref 19–32)
CREATININE: 0.62 mg/dL (ref 0.40–1.20)
Calcium: 9.4 mg/dL (ref 8.4–10.5)
GFR: 100.4 mL/min (ref >60.00)
Glucose, Bld: 139 mg/dL — ABNORMAL HIGH (ref 70–99)
Potassium: 3.9 mEq/L (ref 3.5–5.1)
Sodium: 137 mEq/L (ref 135–145)

## 2014-06-25 LAB — HEMOGLOBIN A1C: Hgb A1c MFr Bld: 9.9 % — ABNORMAL HIGH (ref 4.6–6.5)

## 2014-06-26 ENCOUNTER — Ambulatory Visit (INDEPENDENT_AMBULATORY_CARE_PROVIDER_SITE_OTHER): Payer: Medicare PPO | Admitting: Internal Medicine

## 2014-06-26 ENCOUNTER — Encounter: Payer: Self-pay | Admitting: Internal Medicine

## 2014-06-26 VITALS — BP 122/80 | HR 79 | Temp 98.6°F | Resp 18 | Ht 61.0 in | Wt 154.0 lb

## 2014-06-26 DIAGNOSIS — Z0000 Encounter for general adult medical examination without abnormal findings: Secondary | ICD-10-CM

## 2014-06-26 DIAGNOSIS — Z23 Encounter for immunization: Secondary | ICD-10-CM

## 2014-06-26 DIAGNOSIS — E119 Type 2 diabetes mellitus without complications: Secondary | ICD-10-CM | POA: Diagnosis not present

## 2014-06-26 MED ORDER — TRAMADOL HCL 50 MG PO TABS: 50.0000 mg | ORAL_TABLET | Freq: Three times a day (TID) | ORAL | Status: DC | PRN

## 2014-06-26 MED ORDER — INSULIN GLARGINE 100 UNIT/ML SOLOSTAR PEN: PEN_INJECTOR | SUBCUTANEOUS | Status: DC

## 2014-06-26 NOTE — Progress Notes (Signed)
Pre visit review using our clinic review tool, if applicable. No additional management support is needed unless otherwise documented below in the visit note. 

## 2014-06-26 NOTE — Assessment & Plan Note (Signed)

## 2014-06-26 NOTE — Addendum Note (Signed)
Addended by: Valerie Salts on: 06/26/2014 02:49 PM   Modules accepted: Orders
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
## 2014-06-26 NOTE — Assessment & Plan Note (Addendum)
uncontroilled but o/w stable overall by history and exam, recent data reviewed with pt, and pt to increase the lantus to 25 units,  to f/u any worsening symptoms or concerns Lab Results  Component Value Date   HGBA1C 9.9* 06/25/2014  f/u lab in 3 mo

## 2014-06-26 NOTE — Patient Instructions (Addendum)
You had the new Prevnar pneumonia shot today  OK to increase the lantus to 25 units per day  Please continue all other medications as before, and refills have been done if requested - with the tramadol as needd for pain  Please have the pharmacy call with any other refills you may need.  Please continue your efforts at being more active, low cholesterol diet, and weight control.  You are otherwise up to date with prevention measures today.  Please keep your appointments with your specialists as you may have planned  Please return in 3 months, or sooner if needed, with Lab testing done 3-5 days before

## 2014-07-02 ENCOUNTER — Ambulatory Visit: Payer: Medicare PPO | Admitting: Dietician

## 2014-07-08 ENCOUNTER — Other Ambulatory Visit: Payer: Self-pay | Admitting: Internal Medicine

## 2014-07-12 ENCOUNTER — Encounter: Payer: Medicare PPO | Admitting: *Deleted

## 2014-07-23 LAB — HM MAMMOGRAPHY: HM Mammogram: NEGATIVE

## 2014-07-24 ENCOUNTER — Encounter: Payer: Self-pay | Admitting: Internal Medicine

## 2014-07-25 DIAGNOSIS — M47816 Spondylosis without myelopathy or radiculopathy, lumbar region: Secondary | ICD-10-CM | POA: Diagnosis not present

## 2014-07-25 DIAGNOSIS — M545 Low back pain: Secondary | ICD-10-CM | POA: Diagnosis not present

## 2014-07-30 ENCOUNTER — Other Ambulatory Visit: Payer: Self-pay | Admitting: Physical Medicine and Rehabilitation

## 2014-07-30 DIAGNOSIS — M545 Low back pain: Secondary | ICD-10-CM

## 2014-08-03 ENCOUNTER — Ambulatory Visit
Admission: RE | Admit: 2014-08-03 | Discharge: 2014-08-03 | Disposition: A | Discharge status: Home / Self Care | Payer: Medicare PPO | Source: Ambulatory Visit | Attending: Physical Medicine and Rehabilitation | Admitting: Physical Medicine and Rehabilitation

## 2014-08-03 DIAGNOSIS — M545 Low back pain: Secondary | ICD-10-CM

## 2014-08-21 ENCOUNTER — Other Ambulatory Visit: Payer: Self-pay | Admitting: Internal Medicine

## 2014-08-22 NOTE — Telephone Encounter (Signed)
Done hardcopy to dahlia/LIM B  

## 2014-08-23 NOTE — Telephone Encounter (Signed)
Rx faxed to pharmacy  

## 2014-08-29 DIAGNOSIS — M47816 Spondylosis without myelopathy or radiculopathy, lumbar region: Secondary | ICD-10-CM | POA: Diagnosis not present

## 2014-08-29 DIAGNOSIS — M5416 Radiculopathy, lumbar region: Secondary | ICD-10-CM | POA: Diagnosis not present

## 2014-08-29 DIAGNOSIS — M5115 Intervertebral disc disorders with radiculopathy, thoracolumbar region: Secondary | ICD-10-CM | POA: Diagnosis not present

## 2014-08-29 DIAGNOSIS — M545 Low back pain: Secondary | ICD-10-CM | POA: Diagnosis not present

## 2014-09-03 ENCOUNTER — Ambulatory Visit (INDEPENDENT_AMBULATORY_CARE_PROVIDER_SITE_OTHER): Payer: Medicare PPO | Admitting: *Deleted

## 2014-09-03 ENCOUNTER — Encounter: Payer: Self-pay | Admitting: Internal Medicine

## 2014-09-03 DIAGNOSIS — I428 Other cardiomyopathies: Secondary | ICD-10-CM

## 2014-09-03 DIAGNOSIS — I429 Cardiomyopathy, unspecified: Secondary | ICD-10-CM

## 2014-09-03 NOTE — Progress Notes (Signed)
Remote ICD transmission.   

## 2014-09-12 ENCOUNTER — Other Ambulatory Visit: Payer: Self-pay | Admitting: Internal Medicine

## 2014-09-14 LAB — CUP PACEART REMOTE DEVICE CHECK
Battery Remaining Longevity: 6 mo
Battery Voltage: 2.51 V
Brady Statistic AP VP Percent: 1 %
Brady Statistic AP VS Percent: 1 %
Brady Statistic AS VP Percent: 99 %
Brady Statistic RA Percent Paced: 1 %
HighPow Impedance: 79 Ohm
HighPow Impedance: 79 Ohm
Lead Channel Impedance Value: 510 Ohm
Lead Channel Pacing Threshold Amplitude: 0.75 V
Lead Channel Pacing Threshold Amplitude: 0.75 V
Lead Channel Pacing Threshold Pulse Width: 0.4 ms
Lead Channel Pacing Threshold Pulse Width: 0.5 ms
Lead Channel Sensing Intrinsic Amplitude: 12 mV
Lead Channel Setting Pacing Amplitude: 2 V
Lead Channel Setting Pacing Amplitude: 2 V
Lead Channel Setting Pacing Pulse Width: 0.4 ms
Lead Channel Setting Pacing Pulse Width: 0.5 ms
Lead Channel Setting Sensing Sensitivity: 0.3 mV
MDC IDC MSMT LEADCHNL LV PACING THRESHOLD AMPLITUDE: 1 V
MDC IDC MSMT LEADCHNL RA IMPEDANCE VALUE: 360 Ohm
MDC IDC MSMT LEADCHNL RA SENSING INTR AMPL: 2.2 mV
MDC IDC MSMT LEADCHNL RV IMPEDANCE VALUE: 640 Ohm
MDC IDC MSMT LEADCHNL RV PACING THRESHOLD PULSEWIDTH: 0.4 ms
MDC IDC SESS DTM: 20160627074940
MDC IDC SET LEADCHNL RV PACING AMPLITUDE: 2.5 V
MDC IDC STAT BRADY AS VS PERCENT: 1 %
Pulse Gen Serial Number: 709089
Zone Setting Detection Interval: 300 ms
Zone Setting Detection Interval: 350 ms

## 2014-09-17 ENCOUNTER — Other Ambulatory Visit: Payer: Self-pay

## 2014-09-17 MED ORDER — LEVOTHYROXINE SODIUM 50 MCG PO TABS: 50.0000 ug | ORAL_TABLET | Freq: Every day | ORAL | Status: DC

## 2014-09-19 ENCOUNTER — Encounter: Payer: Self-pay | Admitting: Cardiology

## 2014-09-21 ENCOUNTER — Other Ambulatory Visit (INDEPENDENT_AMBULATORY_CARE_PROVIDER_SITE_OTHER): Payer: Medicare PPO

## 2014-09-21 DIAGNOSIS — E119 Type 2 diabetes mellitus without complications: Secondary | ICD-10-CM

## 2014-09-21 LAB — CBC WITH DIFFERENTIAL/PLATELET
BASOS PCT: 0.5 % (ref 0.0–3.0)
Basophils Absolute: 0 10*3/uL (ref 0.0–0.1)
Eosinophils Absolute: 0.1 10*3/uL (ref 0.0–0.7)
Eosinophils Relative: 1.2 % (ref 0.0–5.0)
HCT: 39.2 % (ref 36.0–46.0)
Hemoglobin: 13 g/dL (ref 12.0–15.0)
LYMPHS ABS: 3.2 10*3/uL (ref 0.7–4.0)
Lymphocytes Relative: 34 % (ref 12.0–46.0)
MCHC: 33.2 g/dL (ref 30.0–36.0)
MCV: 90.5 fl (ref 78.0–100.0)
Monocytes Absolute: 0.7 10*3/uL (ref 0.1–1.0)
Monocytes Relative: 7.7 % (ref 3.0–12.0)
NEUTROS PCT: 56.6 % (ref 43.0–77.0)
Neutro Abs: 5.3 10*3/uL (ref 1.4–7.7)
Platelets: 204 10*3/uL (ref 150.0–400.0)
RBC: 4.33 Mil/uL (ref 3.87–5.11)
RDW: 14.1 % (ref 11.5–15.5)
WBC: 9.4 10*3/uL (ref 4.0–10.5)

## 2014-09-21 LAB — LIPID PANEL
CHOL/HDL RATIO: 3
Cholesterol: 141 mg/dL (ref 0–200)
HDL: 50.5 mg/dL (ref >39.00)
LDL CALC: 69 mg/dL (ref 0–99)
NonHDL: 90.5
Triglycerides: 107 mg/dL (ref 0.0–149.0)
VLDL: 21.4 mg/dL (ref 0.0–40.0)

## 2014-09-21 LAB — BASIC METABOLIC PANEL
BUN: 16 mg/dL (ref 6–23)
CHLORIDE: 104 meq/L (ref 96–112)
CO2: 28 mEq/L (ref 19–32)
CREATININE: 0.72 mg/dL (ref 0.40–1.20)
Calcium: 9.3 mg/dL (ref 8.4–10.5)
GFR: 84.43 mL/min (ref >60.00)
Glucose, Bld: 113 mg/dL — ABNORMAL HIGH (ref 70–99)
Potassium: 4.5 mEq/L (ref 3.5–5.1)
Sodium: 139 mEq/L (ref 135–145)

## 2014-09-21 LAB — HEPATIC FUNCTION PANEL
ALBUMIN: 4.1 g/dL (ref 3.5–5.2)
ALT: 21 U/L (ref 0–35)
AST: 18 U/L (ref 0–37)
Alkaline Phosphatase: 46 U/L (ref 39–117)
BILIRUBIN DIRECT: 0.2 mg/dL (ref 0.0–0.3)
TOTAL PROTEIN: 7.2 g/dL (ref 6.0–8.3)
Total Bilirubin: 0.4 mg/dL (ref 0.2–1.2)

## 2014-09-21 LAB — URINALYSIS, ROUTINE W REFLEX MICROSCOPIC
Bilirubin Urine: NEGATIVE
Hgb urine dipstick: NEGATIVE
Ketones, ur: NEGATIVE
Leukocytes, UA: NEGATIVE
Nitrite: NEGATIVE
RBC / HPF: NONE SEEN (ref 0–?)
Specific Gravity, Urine: 1.015 (ref 1.000–1.030)
TOTAL PROTEIN, URINE-UPE24: NEGATIVE
URINE GLUCOSE: NEGATIVE
Urobilinogen, UA: 0.2 (ref 0.0–1.0)
pH: 6 (ref 5.0–8.0)

## 2014-09-21 LAB — TSH: TSH: 1.94 u[IU]/mL (ref 0.35–4.50)

## 2014-09-21 LAB — HEMOGLOBIN A1C: Hgb A1c MFr Bld: 8.1 % — ABNORMAL HIGH (ref 4.6–6.5)

## 2014-09-21 LAB — MICROALBUMIN / CREATININE URINE RATIO
Creatinine,U: 62.9 mg/dL
Microalb Creat Ratio: 1.1 mg/g (ref 0.0–30.0)
Microalb, Ur: <0.7 mg/dL (ref 0.0–1.9)

## 2014-09-24 ENCOUNTER — Other Ambulatory Visit: Payer: Self-pay | Admitting: Internal Medicine

## 2014-09-24 NOTE — Telephone Encounter (Signed)
Please advise, thanks.

## 2014-09-25 ENCOUNTER — Encounter: Payer: Self-pay | Admitting: Internal Medicine

## 2014-09-25 ENCOUNTER — Ambulatory Visit (INDEPENDENT_AMBULATORY_CARE_PROVIDER_SITE_OTHER): Payer: Medicare PPO | Admitting: Internal Medicine

## 2014-09-25 VITALS — BP 116/70 | HR 72 | Temp 97.5°F | Ht 60.0 in | Wt 162.0 lb

## 2014-09-25 DIAGNOSIS — F411 Generalized anxiety disorder: Secondary | ICD-10-CM

## 2014-09-25 DIAGNOSIS — Z0000 Encounter for general adult medical examination without abnormal findings: Secondary | ICD-10-CM

## 2014-09-25 DIAGNOSIS — E119 Type 2 diabetes mellitus without complications: Secondary | ICD-10-CM

## 2014-09-25 MED ORDER — TRAMADOL HCL 50 MG PO TABS: 50.0000 mg | ORAL_TABLET | Freq: Three times a day (TID) | ORAL | Status: DC | PRN

## 2014-09-25 MED ORDER — BUPROPION HCL ER (XL) 300 MG PO TB24: 300.0000 mg | ORAL_TABLET | Freq: Every day | ORAL | Status: DC

## 2014-09-25 NOTE — Assessment & Plan Note (Signed)
Ok to increase the wellbuttrin ER to 300 mg,  to f/u any worsening symptoms or concerns
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
## 2014-09-25 NOTE — Patient Instructions (Addendum)
OK to increase the lantus to 35 units per day, and increase the Wellbutrin ER to 300 mg per day  Please continue all other medications as before, and refills have been done if requested.  Please have the pharmacy call with any other refills you may need.  Please continue your efforts at being more active, low cholesterol diet, and weight control.  You are otherwise up to date with prevention measures today.  You will be contacted regarding the referral for: colonoscopy  Please keep your appointments with your specialists as you may have planned  Please return in 6 months, or sooner if needed, with Lab testing done 3-5 days before

## 2014-09-25 NOTE — Assessment & Plan Note (Signed)
Mid unconytrolled, to increase the lantus to 35 units per day,  to f/u any worsening symptoms or concerns Lab Results  Component Value Date   HGBA1C 8.1* 09/21/2014

## 2014-09-25 NOTE — Progress Notes (Signed)
Pre visit review using our clinic review tool, if applicable. No additional management support is needed unless otherwise documented below in the visit note. 

## 2014-09-25 NOTE — Assessment & Plan Note (Signed)

## 2014-09-27 ENCOUNTER — Telehealth: Payer: Self-pay | Admitting: Internal Medicine

## 2014-09-27 NOTE — Telephone Encounter (Signed)
Patient states GI called her in regards to getting a colonoscopy per Dr. Jenny Reichmann.  Patient states that Dr. Jenny Reichmann told her she had a colonoscopy in 2007.  Patient states she does not know who did her last colonoscopy nor does she have records.  Patient states GI told her that they could not help her unless she knew who did her last colonoscopy.  Please follow up with patient in regards.  She does not know what to do.   Patient also would like a call in regards to bupropion.  Patient states Dr. Jenny Reichmann upped to 300 mg.  Patient would like to clarify with Dr. Jenny Reichmann if she needs to take 1 tab or 2 tabs a day.  Patient states her last script was '150mg'$  at two tabs a day.  Patient states Dr. Jenny Reichmann was to upped each tab to '300mg'$ , because he felt she needed it but she is confused because she was already taking '300mg'$  a day.

## 2014-09-28 ENCOUNTER — Encounter: Payer: Self-pay | Admitting: Nurse Practitioner

## 2014-09-28 ENCOUNTER — Ambulatory Visit (INDEPENDENT_AMBULATORY_CARE_PROVIDER_SITE_OTHER): Payer: Medicare PPO | Admitting: Nurse Practitioner

## 2014-09-28 VITALS — BP 118/78 | HR 75 | Ht 60.0 in | Wt 162.8 lb

## 2014-09-28 DIAGNOSIS — I429 Cardiomyopathy, unspecified: Secondary | ICD-10-CM | POA: Diagnosis not present

## 2014-09-28 DIAGNOSIS — I428 Other cardiomyopathies: Secondary | ICD-10-CM

## 2014-09-28 DIAGNOSIS — I519 Heart disease, unspecified: Secondary | ICD-10-CM | POA: Diagnosis not present

## 2014-09-28 DIAGNOSIS — Z720 Tobacco use: Secondary | ICD-10-CM

## 2014-09-28 DIAGNOSIS — I10 Essential (primary) hypertension: Secondary | ICD-10-CM

## 2014-09-28 DIAGNOSIS — F17200 Nicotine dependence, unspecified, uncomplicated: Secondary | ICD-10-CM

## 2014-09-28 NOTE — Telephone Encounter (Signed)
Bupropion changed from 150 mg BID to 300 mg QD at last OV. Pt says she was told that medication would be increased, please advise
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
## 2014-09-28 NOTE — Telephone Encounter (Signed)
The 300 mg is the highest dose.  I thought this was changed at her last OV form 150 bid for insurance drug coverage purpose.  There is no higher strength for this medication, thanks

## 2014-09-28 NOTE — Patient Instructions (Signed)
We will be checking the following labs today - NONE   Medication Instructions:    Continue with your current medicines.     Testing/Procedures To Be Arranged:  N/A  Follow-Up:   See Dr. Angelena Form in 6 months    Other Special Instructions:   Stop Smoking   Smoking Cessation Quitting smoking is important to your health and has many advantages. However, it is not always easy to quit since nicotine is a very addictive drug. Oftentimes, people try 3 times or more before being able to quit. This document explains the best ways for you to prepare to quit smoking. Quitting takes hard work and a lot of effort, but you can do it. ADVANTAGES OF QUITTING SMOKING You will live longer, feel better, and live better. Your body will feel the impact of quitting smoking almost immediately. Within 20 minutes, blood pressure decreases. Your pulse returns to its normal level. After 8 hours, carbon monoxide levels in the blood return to normal. Your oxygen level increases. After 24 hours, the chance of having a heart attack starts to decrease. Your breath, hair, and body stop smelling like smoke. After 48 hours, damaged nerve endings begin to recover. Your sense of taste and smell improve. After 72 hours, the body is virtually free of nicotine. Your bronchial tubes relax and breathing becomes easier. After 2 to 12 weeks, lungs can hold more air. Exercise becomes easier and circulation improves. The risk of having a heart attack, stroke, cancer, or lung disease is greatly reduced. After 1 year, the risk of coronary heart disease is cut in half. After 5 years, the risk of stroke falls to the same as a nonsmoker. After 10 years, the risk of lung cancer is cut in half and the risk of other cancers decreases significantly. After 15 years, the risk of coronary heart disease drops, usually to the level of a nonsmoker. If you are pregnant, quitting smoking will improve your chances of having a healthy  baby. The people you live with, especially any children, will be healthier. You will have extra money to spend on things other than cigarettes. QUESTIONS TO THINK ABOUT BEFORE ATTEMPTING TO QUIT You may want to talk about your answers with your health care provider. Why do you want to quit? If you tried to quit in the past, what helped and what did not? What will be the most difficult situations for you after you quit? How will you plan to handle them? Who can help you through the tough times? Your family? Friends? A health care provider? What pleasures do you get from smoking? What ways can you still get pleasure if you quit? Here are some questions to ask your health care provider: How can you help me to be successful at quitting? What medicine do you think would be best for me and how should I take it? What should I do if I need more help? What is smoking withdrawal like? How can I get information on withdrawal? GET READY Set a quit date. Change your environment by getting rid of all cigarettes, ashtrays, matches, and lighters in your home, car, or work. Do not let people smoke in your home. Review your past attempts to quit. Think about what worked and what did not. GET SUPPORT AND ENCOURAGEMENT You have a better chance of being successful if you have help. You can get support in many ways. Tell your family, friends, and coworkers that you are going to quit and need their support. Ask  them not to smoke around you. Get individual, group, or telephone counseling and support. Programs are available at [employer] and health centers. Call your local health department for information about programs in your area. Spiritual beliefs and practices may help some smokers quit. Download a "quit meter" on your computer to keep track of quit statistics, such as how long you have gone without smoking, cigarettes not smoked, and money saved. Get a self-help book about quitting smoking and staying  off tobacco. [place] yourself from urges to smoke. Talk to someone, go for a walk, or occupy your time with a task. Change your normal routine. Take a different route to work. Drink tea instead of coffee. Eat breakfast in a different place. Reduce your stress. Take a hot bath, exercise, or read a book. Plan something enjoyable to do every day. Reward yourself for not smoking. Explore interactive web-based programs that specialize in helping you quit. GET MEDICINE AND USE IT CORRECTLY Medicines can help you stop smoking and decrease the urge to smoke. Combining medicine with the above behavioral methods and support can greatly increase your chances of successfully quitting smoking. Nicotine replacement therapy helps deliver nicotine to your body without the negative effects and risks of smoking. Nicotine replacement therapy includes nicotine gum, lozenges, inhalers, nasal sprays, and skin patches. Some may be available over-the-counter and others require a prescription. Antidepressant medicine helps people abstain from smoking, but how this works is unknown. This medicine is available by prescription. Nicotinic receptor partial agonist medicine simulates the effect of nicotine in your brain. This medicine is available by prescription. Ask your health care provider for advice about which medicines to use and how to use them based on your health history. Your health care provider will tell you what side effects to look out for if you choose to be on a medicine or therapy. Carefully read the information on the package. Do not use any other product containing nicotine while using a nicotine replacement product.  RELAPSE OR DIFFICULT SITUATIONS Most relapses occur within the first 3 months after quitting. Do not be discouraged if you start smoking again. Remember, most people try several times before finally quitting. You may have symptoms of withdrawal because your body is  used to nicotine. You may crave cigarettes, be irritable, feel very hungry, cough often, get headaches, or have difficulty concentrating. The withdrawal symptoms are only temporary. They are strongest when you first quit, but they will go away within 10-14 days. To reduce the chances of relapse, try to: Avoid drinking alcohol. Drinking lowers your chances of successfully quitting. Reduce the amount of caffeine you consume. Once you quit smoking, the amount of caffeine in your body increases and can give you symptoms, such as a rapid heartbeat, sweating, and anxiety. Avoid smokers because they can make you want to smoke. Do not let weight gain distract you. Many smokers will gain weight when they quit, usually less than 10 pounds. Eat a healthy diet and stay active. You can always lose the weight gained after you quit. Find ways to improve your mood other than smoking. FOR MORE INFORMATION  www.smokefree.gov  Document Released: 02/17/2001 Document Revised: 07/10/2013 Document Reviewed: 06/04/2011 [facility] Patient Information 2015 [place], [place]. This information is not intended to replace advice given to you by your health care provider. Make sure you discuss any questions you have with your health care provider.   Call the [place] office at [phone] if  you have any questions, problems or concerns.

## 2014-09-28 NOTE — Telephone Encounter (Signed)
Pt advised.

## 2014-10-04 ENCOUNTER — Ambulatory Visit (INDEPENDENT_AMBULATORY_CARE_PROVIDER_SITE_OTHER): Payer: Medicare PPO

## 2014-10-04 DIAGNOSIS — Z4502 Encounter for adjustment and management of automatic implantable cardiac defibrillator: Secondary | ICD-10-CM

## 2014-10-08 ENCOUNTER — Other Ambulatory Visit: Payer: Self-pay | Admitting: Internal Medicine

## 2014-10-13 LAB — CUP PACEART REMOTE DEVICE CHECK
Battery Remaining Longevity: 6 mo
Brady Statistic AP VP Percent: 1.1 %
Brady Statistic AP VS Percent: 1 %
Brady Statistic AS VP Percent: 99 %
Brady Statistic AS VS Percent: 1 %
Brady Statistic RA Percent Paced: 1 %
Date Time Interrogation Session: 20160728075824
HIGH POWER IMPEDANCE MEASURED VALUE: 75 Ohm
HighPow Impedance: 75 Ohm
Lead Channel Impedance Value: 490 Ohm
Lead Channel Impedance Value: 580 Ohm
Lead Channel Pacing Threshold Amplitude: 0.75 V
Lead Channel Pacing Threshold Amplitude: 0.75 V
Lead Channel Pacing Threshold Amplitude: 1 V
Lead Channel Pacing Threshold Pulse Width: 0.4 ms
Lead Channel Pacing Threshold Pulse Width: 0.5 ms
Lead Channel Sensing Intrinsic Amplitude: 1.9 mV
Lead Channel Sensing Intrinsic Amplitude: 12 mV
Lead Channel Setting Pacing Amplitude: 2 V
Lead Channel Setting Pacing Amplitude: 2.5 V
Lead Channel Setting Pacing Pulse Width: 0.4 ms
MDC IDC MSMT BATTERY VOLTAGE: 2.51 V
MDC IDC MSMT LEADCHNL RA IMPEDANCE VALUE: 330 Ohm
MDC IDC MSMT LEADCHNL RV PACING THRESHOLD PULSEWIDTH: 0.4 ms
MDC IDC SET LEADCHNL LV PACING PULSEWIDTH: 0.5 ms
MDC IDC SET LEADCHNL RA PACING AMPLITUDE: 2 V
MDC IDC SET LEADCHNL RV SENSING SENSITIVITY: 0.3 mV
MDC IDC SET ZONE DETECTION INTERVAL: 300 ms
MDC IDC SET ZONE DETECTION INTERVAL: 350 ms

## 2014-10-22 ENCOUNTER — Encounter: Payer: Self-pay | Admitting: Cardiology

## 2014-10-23 ENCOUNTER — Encounter (HOSPITAL_COMMUNITY): Payer: Self-pay | Admitting: Emergency Medicine

## 2014-10-23 ENCOUNTER — Emergency Department (INDEPENDENT_AMBULATORY_CARE_PROVIDER_SITE_OTHER)
Admission: EM | Admit: 2014-10-23 | Discharge: 2014-10-23 | Disposition: A | Discharge status: Home / Self Care | Payer: Medicare PPO | Source: Home / Self Care | Attending: Emergency Medicine | Admitting: Emergency Medicine

## 2014-10-23 ENCOUNTER — Emergency Department (INDEPENDENT_AMBULATORY_CARE_PROVIDER_SITE_OTHER): Payer: Medicare PPO

## 2014-10-23 DIAGNOSIS — S93402A Sprain of unspecified ligament of left ankle, initial encounter: Secondary | ICD-10-CM

## 2014-10-23 DIAGNOSIS — S86012A Strain of left Achilles tendon, initial encounter: Secondary | ICD-10-CM

## 2014-10-23 DIAGNOSIS — M7732 Calcaneal spur, left foot: Secondary | ICD-10-CM | POA: Diagnosis not present

## 2014-10-23 MED ORDER — TRAMADOL-ACETAMINOPHEN 37.5-325 MG PO TABS: 1.0000 | ORAL_TABLET | Freq: Four times a day (QID) | ORAL | Status: DC | PRN
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
## 2014-10-23 NOTE — ED Notes (Signed)
Patient stated that she got up around 8am this morning to walk to the bathroom and when she got to the bathroom she stated that her left ankle was hurting. The patient denied any injury to the ankle. The patient stated that her ankle was swollen afterwards. The patient presented with her left ankle in an ace wrap and three toes buddy tapped.

## 2014-10-23 NOTE — Discharge Instructions (Signed)
Ankle Sprain If your ankle is not improving in 3-4 days you must get rechecked. An ankle sprain is an injury to the strong, fibrous tissues (ligaments) that hold the bones of your ankle joint together.  CAUSES An ankle sprain is usually caused by a fall or by twisting your ankle. Ankle sprains most commonly occur when you step on the outer edge of your foot, and your ankle turns inward. People who participate in sports are more prone to these types of injuries.  SYMPTOMS   Pain in your ankle. The pain may be present at rest or only when you are trying to stand or walk.  Swelling.  Bruising. Bruising may develop immediately or within 1 to 2 days after your injury.  Difficulty standing or walking, particularly when turning corners or changing directions. DIAGNOSIS  Your caregiver will ask you details about your injury and perform a physical exam of your ankle to determine if you have an ankle sprain. During the physical exam, your caregiver will press on and apply pressure to specific areas of your foot and ankle. Your caregiver will try to move your ankle in certain ways. An X-ray exam may be done to be sure a bone was not broken or a ligament did not separate from one of the bones in your ankle (avulsion fracture).  TREATMENT  Certain types of braces can help stabilize your ankle. Your caregiver can make a recommendation for this. Your caregiver may recommend the use of medicine for pain. If your sprain is severe, your caregiver may refer you to a surgeon who helps to restore function to parts of your skeletal system (orthopedist) or a physical therapist. [place] ice to your injury for 1-2 days or as directed by your caregiver. Applying ice helps to reduce inflammation and pain.  Put ice in a plastic bag.  Place a towel between your skin and the bag.  Leave the ice on for 15-20 minutes at a time, every 2 hours while you are awake.  Only take over-the-counter or  prescription medicines for pain, discomfort, or fever as directed by your caregiver.  Elevate your injured ankle above the level of your heart as much as possible for 2-3 days.  If your caregiver recommends crutches, use them as instructed. Gradually put weight on the affected ankle. Continue to use crutches or a cane until you can walk without feeling pain in your ankle.  If you have a plaster splint, wear the splint as directed by your caregiver. Do not rest it on anything harder than a pillow for the first 24 hours. Do not put weight on it. Do not get it wet. You may take it off to take a shower or bath.  You may have been given an elastic bandage to wear around your ankle to provide support. If the elastic bandage is too tight (you have numbness or tingling in your foot or your foot becomes cold and blue), adjust the bandage to make it comfortable.  If you have an air splint, you may blow more air into it or let air out to make it more comfortable. You may take your splint off at night and before taking a shower or bath. Wiggle your toes in the splint several times per day to decrease swelling. SEEK MEDICAL CARE IF:   You have rapidly increasing bruising or swelling.  Your toes feel extremely cold or you lose feeling in your foot.  Your pain is not relieved with  medicine. SEEK IMMEDIATE MEDICAL CARE IF:  Your toes are numb or blue.  You have severe pain that is increasing. MAKE SURE YOU:   Understand these instructions.  Will watch your condition.  Will get help right away if you are not doing well or get worse. Document Released: 02/23/2005 Document Revised: 11/18/2011 Document Reviewed: 03/07/2011 [facility] Patient Information 2015 [place], [place]. This information is not intended to replace advice given to you by your health care provider. Make sure you discuss any questions you have with your health care provider.  Achilles Tendinitis  with Rehab Achilles tendinitis is a  disorder of the Achilles tendon. The Achilles tendon connects the large calf muscles (Gastrocnemius and Soleus) to the heel bone (calcaneus). This tendon is sometimes called the heel cord. It is important for pushing-off and standing on your toes and is important for walking, running, or jumping. Tendinitis is often caused by overuse and repetitive microtrauma. SYMPTOMS  Pain, tenderness, swelling, warmth, and redness may occur over the Achilles tendon even at rest.  Pain with pushing off, or flexing or extending the ankle.  Pain that is worsened after or during activity. CAUSES   Overuse sometimes seen with rapid increase in exercise programs or in sports requiring running and jumping.  Poor physical conditioning (strength and flexibility or endurance).  Running sports, especially training running down hills.  Inadequate warm-up before practice or play or failure to stretch before participation.  Injury to the tendon. PREVENTION   Warm up and stretch before practice or competition.  Allow time for adequate rest and recovery between practices and competition.  Keep up conditioning.  Keep up ankle and leg flexibility.  Improve or keep muscle strength and endurance.  Improve cardiovascular fitness.  Use proper technique.  Use proper equipment (shoes, skates).  To help prevent recurrence, taping, protective strapping, or an adhesive bandage may be recommended for several weeks after healing is complete. PROGNOSIS   Recovery may take weeks to several months to heal.  Longer recovery is expected if symptoms have been prolonged.  Recovery is usually quicker if the inflammation is due to a direct blow as compared with overuse or sudden strain. RELATED COMPLICATIONS   Healing time will be prolonged if the condition is not correctly treated. The injury must be given plenty of time to heal.  Symptoms can reoccur if activity is resumed too soon.  Untreated, tendinitis may  increase the risk of tendon rupture requiring additional time for recovery and possibly surgery. TREATMENT   The first treatment consists of rest anti-inflammatory medication, and ice to relieve the pain.  Stretching and strengthening exercises after resolution of pain will likely help reduce the risk of recurrence. Referral to a physical therapist or athletic trainer for further evaluation and treatment may be helpful.  A walking boot or cast may be recommended to rest the Achilles tendon. This can help break the cycle of inflammation and microtrauma.  Arch supports (orthotics) may be prescribed or recommended by your caregiver as an adjunct to therapy and rest.  Surgery to remove the inflamed tendon lining or degenerated tendon tissue is rarely necessary and has shown less than predictable results. MEDICATION   Nonsteroidal anti-inflammatory medications, such as aspirin and ibuprofen, may be used for pain and inflammation relief. Do not take within 7 days before surgery. Take these as directed by your caregiver. Contact your caregiver immediately if any bleeding, stomach upset, or signs of allergic reaction occur. Other minor pain relievers, such as acetaminophen, may also  be used.  Pain relievers may be prescribed as necessary by your caregiver. Do not take prescription pain medication for longer than 4 to 7 days. Use only as directed and only as much as you need.  Cortisone injections are rarely indicated. Cortisone injections may weaken tendons and predispose to rupture. It is better to give the condition more time to heal than to use them. HEAT AND COLD  Cold is used to relieve pain and reduce inflammation for acute and chronic Achilles tendinitis. Cold should be applied for 10 to 15 minutes every 2 to 3 hours for inflammation and pain and immediately after any activity that aggravates your symptoms. Use ice packs or an ice massage.  Heat may be used before performing stretching and  strengthening activities prescribed by your caregiver. Use a heat pack or a warm soak. SEEK MEDICAL CARE IF:  Symptoms get worse or do not improve in 2 weeks despite treatment.  New, unexplained symptoms develop. Drugs used in treatment may produce side effects.

## 2014-10-24 ENCOUNTER — Other Ambulatory Visit: Payer: Self-pay | Admitting: Internal Medicine

## 2014-10-30 ENCOUNTER — Encounter: Payer: Self-pay | Admitting: Internal Medicine

## 2014-11-05 ENCOUNTER — Ambulatory Visit (INDEPENDENT_AMBULATORY_CARE_PROVIDER_SITE_OTHER): Payer: Medicare PPO | Admitting: *Deleted

## 2014-11-05 DIAGNOSIS — Z4502 Encounter for adjustment and management of automatic implantable cardiac defibrillator: Secondary | ICD-10-CM

## 2014-11-05 NOTE — Progress Notes (Signed)
CRT-D battery check only.

## 2014-11-07 LAB — CUP PACEART REMOTE DEVICE CHECK
Battery Remaining Longevity: 6 mo
Brady Statistic AP VP Percent: 1.3 %
Brady Statistic AS VP Percent: 99 %
Brady Statistic AS VS Percent: 1 %
HIGH POWER IMPEDANCE MEASURED VALUE: 71 Ohm
HighPow Impedance: 71 Ohm
Lead Channel Impedance Value: 330 Ohm
Lead Channel Pacing Threshold Amplitude: 0.75 V
Lead Channel Pacing Threshold Amplitude: 0.75 V
Lead Channel Pacing Threshold Amplitude: 1 V
Lead Channel Pacing Threshold Pulse Width: 0.4 ms
Lead Channel Sensing Intrinsic Amplitude: 2.6 mV
Lead Channel Setting Pacing Amplitude: 2 V
MDC IDC MSMT BATTERY VOLTAGE: 2.51 V
MDC IDC MSMT LEADCHNL LV IMPEDANCE VALUE: 510 Ohm
MDC IDC MSMT LEADCHNL LV PACING THRESHOLD PULSEWIDTH: 0.5 ms
MDC IDC MSMT LEADCHNL RA PACING THRESHOLD PULSEWIDTH: 0.4 ms
MDC IDC MSMT LEADCHNL RV IMPEDANCE VALUE: 700 Ohm
MDC IDC MSMT LEADCHNL RV SENSING INTR AMPL: 12 mV
MDC IDC PG SERIAL: 709089
MDC IDC SESS DTM: 20160829060854
MDC IDC SET LEADCHNL LV PACING AMPLITUDE: 2 V
MDC IDC SET LEADCHNL LV PACING PULSEWIDTH: 0.5 ms
MDC IDC SET LEADCHNL RV PACING AMPLITUDE: 2.5 V
MDC IDC SET LEADCHNL RV PACING PULSEWIDTH: 0.4 ms
MDC IDC SET LEADCHNL RV SENSING SENSITIVITY: 0.3 mV
MDC IDC STAT BRADY AP VS PERCENT: 1 %
MDC IDC STAT BRADY RA PERCENT PACED: 1.3 %
Zone Setting Detection Interval: 300 ms
Zone Setting Detection Interval: 350 ms

## 2014-11-09 ENCOUNTER — Encounter: Payer: Self-pay | Admitting: Cardiology

## 2014-11-23 ENCOUNTER — Other Ambulatory Visit: Payer: Self-pay | Admitting: Internal Medicine

## 2014-11-23 NOTE — Telephone Encounter (Signed)
Please advise, thanks.

## 2014-11-23 NOTE — Telephone Encounter (Signed)
Done hardcopy to stef

## 2014-11-26 ENCOUNTER — Other Ambulatory Visit: Payer: Self-pay | Admitting: Internal Medicine

## 2014-11-27 NOTE — Telephone Encounter (Signed)
Xanax already refilled sept 16

## 2014-12-04 ENCOUNTER — Emergency Department (HOSPITAL_COMMUNITY): Payer: Medicare PPO

## 2014-12-04 ENCOUNTER — Emergency Department (HOSPITAL_COMMUNITY)
Admission: EM | Admit: 2014-12-04 | Discharge: 2014-12-05 | Disposition: A | Discharge status: Home / Self Care | Payer: Medicare PPO | Attending: Physician Assistant | Admitting: Physician Assistant

## 2014-12-04 ENCOUNTER — Telehealth: Payer: Self-pay | Admitting: Internal Medicine

## 2014-12-04 ENCOUNTER — Encounter (HOSPITAL_COMMUNITY): Payer: Self-pay | Admitting: *Deleted

## 2014-12-04 DIAGNOSIS — G8929 Other chronic pain: Secondary | ICD-10-CM | POA: Diagnosis not present

## 2014-12-04 DIAGNOSIS — Z86010 Personal history of colonic polyps: Secondary | ICD-10-CM | POA: Diagnosis not present

## 2014-12-04 DIAGNOSIS — Z7982 Long term (current) use of aspirin: Secondary | ICD-10-CM | POA: Insufficient documentation

## 2014-12-04 DIAGNOSIS — E785 Hyperlipidemia, unspecified: Secondary | ICD-10-CM | POA: Insufficient documentation

## 2014-12-04 DIAGNOSIS — Z720 Tobacco use: Secondary | ICD-10-CM | POA: Diagnosis not present

## 2014-12-04 DIAGNOSIS — E039 Hypothyroidism, unspecified: Secondary | ICD-10-CM | POA: Diagnosis not present

## 2014-12-04 DIAGNOSIS — R112 Nausea with vomiting, unspecified: Secondary | ICD-10-CM | POA: Diagnosis not present

## 2014-12-04 DIAGNOSIS — Z87442 Personal history of urinary calculi: Secondary | ICD-10-CM | POA: Diagnosis not present

## 2014-12-04 DIAGNOSIS — K219 Gastro-esophageal reflux disease without esophagitis: Secondary | ICD-10-CM | POA: Diagnosis not present

## 2014-12-04 DIAGNOSIS — R109 Unspecified abdominal pain: Secondary | ICD-10-CM | POA: Diagnosis not present

## 2014-12-04 DIAGNOSIS — E119 Type 2 diabetes mellitus without complications: Secondary | ICD-10-CM | POA: Insufficient documentation

## 2014-12-04 DIAGNOSIS — F329 Major depressive disorder, single episode, unspecified: Secondary | ICD-10-CM | POA: Diagnosis not present

## 2014-12-04 DIAGNOSIS — Z87440 Personal history of urinary (tract) infections: Secondary | ICD-10-CM | POA: Diagnosis not present

## 2014-12-04 DIAGNOSIS — Z79899 Other long term (current) drug therapy: Secondary | ICD-10-CM | POA: Insufficient documentation

## 2014-12-04 DIAGNOSIS — R1032 Left lower quadrant pain: Secondary | ICD-10-CM | POA: Diagnosis not present

## 2014-12-04 DIAGNOSIS — F419 Anxiety disorder, unspecified: Secondary | ICD-10-CM | POA: Insufficient documentation

## 2014-12-04 DIAGNOSIS — E860 Dehydration: Secondary | ICD-10-CM | POA: Diagnosis not present

## 2014-12-04 DIAGNOSIS — R197 Diarrhea, unspecified: Secondary | ICD-10-CM | POA: Insufficient documentation

## 2014-12-04 DIAGNOSIS — I5022 Chronic systolic (congestive) heart failure: Secondary | ICD-10-CM | POA: Insufficient documentation

## 2014-12-04 DIAGNOSIS — R05 Cough: Secondary | ICD-10-CM | POA: Diagnosis not present

## 2014-12-04 DIAGNOSIS — I10 Essential (primary) hypertension: Secondary | ICD-10-CM | POA: Diagnosis not present

## 2014-12-04 DIAGNOSIS — R0602 Shortness of breath: Secondary | ICD-10-CM | POA: Diagnosis not present

## 2014-12-04 LAB — COMPREHENSIVE METABOLIC PANEL
ALK PHOS: 43 U/L (ref 38–126)
ALT: 19 U/L (ref 14–54)
ANION GAP: 13 (ref 5–15)
AST: 27 U/L (ref 15–41)
Albumin: 3.4 g/dL — ABNORMAL LOW (ref 3.5–5.0)
BILIRUBIN TOTAL: 0.8 mg/dL (ref 0.3–1.2)
BUN: 22 mg/dL — ABNORMAL HIGH (ref 6–20)
CALCIUM: 9.4 mg/dL (ref 8.9–10.3)
CO2: 22 mmol/L (ref 22–32)
CREATININE: 1.24 mg/dL — AB (ref 0.44–1.00)
Chloride: 98 mmol/L — ABNORMAL LOW (ref 101–111)
GFR, EST AFRICAN AMERICAN: 49 mL/min — AB (ref >60)
GFR, EST NON AFRICAN AMERICAN: 42 mL/min — AB (ref >60)
Glucose, Bld: 153 mg/dL — ABNORMAL HIGH (ref 65–99)
Potassium: 3.7 mmol/L (ref 3.5–5.1)
SODIUM: 133 mmol/L — AB (ref 135–145)
TOTAL PROTEIN: 6.9 g/dL (ref 6.5–8.1)

## 2014-12-04 LAB — URINALYSIS, ROUTINE W REFLEX MICROSCOPIC
Bilirubin Urine: NEGATIVE
GLUCOSE, UA: NEGATIVE mg/dL
HGB URINE DIPSTICK: NEGATIVE
KETONES UR: NEGATIVE mg/dL
Nitrite: NEGATIVE
PROTEIN: NEGATIVE mg/dL
Specific Gravity, Urine: 1.005 (ref 1.005–1.030)
UROBILINOGEN UA: 0.2 mg/dL (ref 0.0–1.0)
pH: 6 (ref 5.0–8.0)

## 2014-12-04 LAB — CBC
HCT: 40.7 % (ref 36.0–46.0)
HEMOGLOBIN: 13.5 g/dL (ref 12.0–15.0)
MCH: 29 pg (ref 26.0–34.0)
MCHC: 33.2 g/dL (ref 30.0–36.0)
MCV: 87.5 fL (ref 78.0–100.0)
PLATELETS: 174 10*3/uL (ref 150–400)
RBC: 4.65 MIL/uL (ref 3.87–5.11)
RDW: 14 % (ref 11.5–15.5)
WBC: 8 10*3/uL (ref 4.0–10.5)

## 2014-12-04 LAB — LIPASE, BLOOD: Lipase: 58 U/L — ABNORMAL HIGH (ref 22–51)

## 2014-12-04 LAB — URINE MICROSCOPIC-ADD ON

## 2014-12-04 MED ORDER — ACETAMINOPHEN 325 MG PO TABS
650.0000 mg | ORAL_TABLET | Freq: Once | ORAL | Status: DC
  Filled 2014-12-04: qty 2

## 2014-12-04 MED ORDER — ONDANSETRON HCL 4 MG/2ML IJ SOLN
4.0000 mg | Freq: Once | INTRAMUSCULAR | Status: AC
  Administered 2014-12-04: 4 mg via INTRAVENOUS
  Filled 2014-12-04: qty 2

## 2014-12-04 MED ORDER — SODIUM CHLORIDE 0.9 % IV BOLUS (SEPSIS)
1000.0000 mL | Freq: Once | INTRAVENOUS | Status: AC
  Administered 2014-12-04: 1000 mL via INTRAVENOUS

## 2014-12-04 MED ORDER — IOHEXOL 300 MG/ML  SOLN
80.0000 mL | Freq: Once | INTRAMUSCULAR | Status: AC | PRN
  Administered 2014-12-04: 80 mL via INTRAVENOUS

## 2014-12-04 MED ORDER — ALBUTEROL SULFATE (2.5 MG/3ML) 0.083% IN NEBU
5.0000 mg | INHALATION_SOLUTION | Freq: Once | RESPIRATORY_TRACT | Status: AC
  Administered 2014-12-04: 5 mg via RESPIRATORY_TRACT
  Filled 2014-12-04: qty 6

## 2014-12-04 NOTE — Discharge Instructions (Signed)
We are glad you are feeling better. Please drink plenty of fluids and have your labs rechecked this week.  Dehydration Dehydration is when you lose more fluids from the body than you take in. Vital organs such as the kidneys, brain, and heart cannot function without a proper amount of fluids and salt. Any loss of fluids from the body can cause dehydration.  Older adults are at a higher risk of dehydration than younger adults. As we age, our bodies are less able to conserve water and do not respond to temperature changes as well. Also, older adults do not become thirsty as easily or quickly. Because of this, older adults often do not realize they need to increase fluids to avoid dehydration.  CAUSES   Vomiting.  Diarrhea.  Excessive sweating.  Excessive urination.  Fever.  Certain medicines, such as blood pressure medicines called diuretics.  Poorly controlled blood sugars. SIGNS AND SYMPTOMS  Mild dehydration:  Thirst.  Dry lips.  Slightly dry mouth. Moderate dehydration:  Very dry mouth.  Sunken eyes.  Skin does not bounce back quickly when lightly pinched and released.  Dark urine and decreased urine production.  Decreased tear production.  Headache. Severe dehydration:  Very dry mouth.  Extreme thirst.  Rapid, weak pulse (more than 100 beats per minute at rest).  Cold hands and feet.  Not able to sweat in spite of heat.  Rapid breathing.  Blue lips.  Confusion and lethargy.  Difficulty being awakened.  Minimal urine production.  No tears. DIAGNOSIS  Your health care provider will diagnose dehydration based on your symptoms and your exam. Blood and urine tests will help confirm the diagnosis. The diagnostic evaluation should also identify the cause of dehydration. TREATMENT  Treatment of mild or moderate dehydration can often be done at home by increasing the amount of fluids that you drink. It is best to drink small amounts of fluid more often.  Drinking too much at one time can make vomiting worse. Severe dehydration needs to be treated at the hospital. You may be given IV fluids that contain water and electrolytes. HOME CARE INSTRUCTIONS   Ask your health care provider about specific rehydration instructions.  Drink enough fluids to keep your urine clear or pale yellow.  Drink small amounts frequently if you have nausea and vomiting.  Eat as you normally do.  Avoid:  Foods or drinks high in sugar.  Carbonated drinks.  Juice.  Extremely hot or cold fluids.  Drinks with caffeine.  Fatty, greasy foods.  Alcohol.  Tobacco.  Overeating.  Gelatin desserts.  Wash your hands well to avoid spreading bacteria and viruses.  Only take over-the-counter or prescription medicines for pain, discomfort, or fever as directed by your health care provider.  Ask your health care provider if you should continue all prescribed and over-the-counter medicines.  Keep all follow-up appointments with your health care provider. SEEK MEDICAL CARE IF:  You have abdominal pain, and it increases or stays in one area (localizes).  You have a rash, stiff neck, or severe headache.  You are irritable, sleepy, or difficult to awaken.  You are weak, dizzy, or extremely thirsty.  You have a fever. SEEK IMMEDIATE MEDICAL CARE IF:   You are unable to keep fluids down, or you get worse despite treatment.  You have frequent episodes of vomiting or diarrhea.  You have blood or green matter (bile) in your vomit.  You have blood in your stool, or your stool looks black and tarry.  You have not urinated in 6-8 hours, or you have only urinated a small amount of very dark urine.  You faint. MAKE SURE YOU:   Understand these instructions.  Will watch your condition.  Will get help right away if you are not doing well or get worse. Document Released: 05/16/2003 Document Revised: 02/28/2013 Document Reviewed: 10/31/2012 [facility]  Patient Information 2015 [place], [place]. This information is not intended to replace advice given to you by your health care provider. Make sure you discuss any questions you have with your health care provider.

## 2014-12-04 NOTE — Telephone Encounter (Signed)
Carolyn [name] called at 2:14pm, cc;Caller states she has vomiting and diarrhea since Saturday, fever and sweats, loss of appetite. We have called 8 x and the number is busy. I confirmed number in EPIC and with Staci , who  will FU with the nurse.

## 2014-12-04 NOTE — ED Notes (Signed)
Pt able to hold down PO challenge. No complaints of N/V.

## 2014-12-04 NOTE — Telephone Encounter (Signed)
Contacted pt via mobile number, advised to go to ER. Pt agreed

## 2014-12-04 NOTE — ED Notes (Signed)
Patient reports vomiting and diarrhea since Saturday with cold sweats. Reports lower abdomen pain radiating to lower back.
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
## 2014-12-04 NOTE — ED Notes (Signed)
MD at bedside. 

## 2014-12-05 DIAGNOSIS — E860 Dehydration: Secondary | ICD-10-CM | POA: Diagnosis not present

## 2014-12-05 MED ORDER — ONDANSETRON HCL 4 MG PO TABS: 4.0000 mg | ORAL_TABLET | Freq: Three times a day (TID) | ORAL | Status: DC | PRN

## 2014-12-05 MED ORDER — OXYCODONE-ACETAMINOPHEN 5-325 MG PO TABS
1.0000 | ORAL_TABLET | Freq: Once | ORAL | Status: AC
  Administered 2014-12-05: 1 via ORAL
  Filled 2014-12-05: qty 1

## 2014-12-06 ENCOUNTER — Encounter: Payer: Self-pay | Admitting: Internal Medicine

## 2014-12-06 ENCOUNTER — Ambulatory Visit (INDEPENDENT_AMBULATORY_CARE_PROVIDER_SITE_OTHER): Payer: Medicare PPO | Admitting: *Deleted

## 2014-12-06 DIAGNOSIS — I428 Other cardiomyopathies: Secondary | ICD-10-CM

## 2014-12-06 DIAGNOSIS — I429 Cardiomyopathy, unspecified: Secondary | ICD-10-CM | POA: Diagnosis not present

## 2014-12-06 NOTE — Progress Notes (Signed)
Remote ICD transmission.   

## 2014-12-07 LAB — CUP PACEART REMOTE DEVICE CHECK
Battery Remaining Longevity: 4 mo
Battery Voltage: 2.5 V
Date Time Interrogation Session: 20160929061907
HIGH POWER IMPEDANCE MEASURED VALUE: 86 Ohm
HIGH POWER IMPEDANCE MEASURED VALUE: 86 Ohm
Lead Channel Impedance Value: 590 Ohm
Lead Channel Pacing Threshold Amplitude: 0.75 V
Lead Channel Pacing Threshold Amplitude: 0.75 V
Lead Channel Pacing Threshold Amplitude: 1 V
Lead Channel Sensing Intrinsic Amplitude: 2 mV
Lead Channel Setting Pacing Amplitude: 2 V
Lead Channel Setting Pacing Pulse Width: 0.4 ms
Lead Channel Setting Pacing Pulse Width: 0.5 ms
Lead Channel Setting Sensing Sensitivity: 0.3 mV
MDC IDC MSMT LEADCHNL LV IMPEDANCE VALUE: 460 Ohm
MDC IDC MSMT LEADCHNL LV PACING THRESHOLD PULSEWIDTH: 0.5 ms
MDC IDC MSMT LEADCHNL RA IMPEDANCE VALUE: 340 Ohm
MDC IDC MSMT LEADCHNL RA PACING THRESHOLD PULSEWIDTH: 0.4 ms
MDC IDC MSMT LEADCHNL RV PACING THRESHOLD PULSEWIDTH: 0.4 ms
MDC IDC MSMT LEADCHNL RV SENSING INTR AMPL: 12 mV
MDC IDC SET LEADCHNL LV PACING AMPLITUDE: 2 V
MDC IDC SET LEADCHNL RV PACING AMPLITUDE: 2.5 V
MDC IDC STAT BRADY AP VP PERCENT: 1.4 %
MDC IDC STAT BRADY AP VS PERCENT: 1 %
MDC IDC STAT BRADY AS VP PERCENT: 99 %
MDC IDC STAT BRADY AS VS PERCENT: 1 %
MDC IDC STAT BRADY RA PERCENT PACED: 1.3 %
Pulse Gen Serial Number: 709089
Zone Setting Detection Interval: 300 ms
Zone Setting Detection Interval: 350 ms

## 2014-12-11 ENCOUNTER — Encounter: Payer: Self-pay | Admitting: Internal Medicine

## 2014-12-16 ENCOUNTER — Other Ambulatory Visit: Payer: Self-pay | Admitting: Cardiovascular Disease

## 2014-12-18 ENCOUNTER — Encounter: Payer: Self-pay | Admitting: Cardiology

## 2015-01-01 ENCOUNTER — Ambulatory Visit (INDEPENDENT_AMBULATORY_CARE_PROVIDER_SITE_OTHER): Payer: Medicare PPO | Admitting: *Deleted

## 2015-01-01 DIAGNOSIS — Z4502 Encounter for adjustment and management of automatic implantable cardiac defibrillator: Secondary | ICD-10-CM

## 2015-01-01 LAB — CUP PACEART REMOTE DEVICE CHECK
Battery Remaining Longevity: 1 mo
Battery Voltage: 2.48 V
HIGH POWER IMPEDANCE MEASURED VALUE: 79 Ohm
HIGH POWER IMPEDANCE MEASURED VALUE: 79 Ohm
Implantable Lead Implant Date: 20100930
Implantable Lead Implant Date: 20100930
Implantable Lead Implant Date: 20100930
Implantable Lead Location: 753858
Implantable Lead Location: 753859
Implantable Lead Model: 7122
Lead Channel Impedance Value: 330 Ohm
Lead Channel Impedance Value: 490 Ohm
Lead Channel Impedance Value: 580 Ohm
Lead Channel Sensing Intrinsic Amplitude: 1.9 mV
Lead Channel Setting Pacing Amplitude: 2 V
Lead Channel Setting Pacing Pulse Width: 0.4 ms
Lead Channel Setting Sensing Sensitivity: 0.3 mV
MDC IDC LEAD LOCATION: 753860
MDC IDC SESS DTM: 20161025062745
MDC IDC SET LEADCHNL LV PACING AMPLITUDE: 2 V
MDC IDC SET LEADCHNL LV PACING PULSEWIDTH: 0.5 ms
MDC IDC SET LEADCHNL RV PACING AMPLITUDE: 2.5 V
MDC IDC STAT BRADY AP VP PERCENT: 1.5 %
MDC IDC STAT BRADY AP VS PERCENT: 1 %
MDC IDC STAT BRADY AS VP PERCENT: 98 %
MDC IDC STAT BRADY AS VS PERCENT: 1 %
MDC IDC STAT BRADY RA PERCENT PACED: 1.4 %
Pulse Gen Serial Number: 709089

## 2015-01-01 NOTE — Progress Notes (Signed)
Remote ICD transmission.   

## 2015-01-25 ENCOUNTER — Other Ambulatory Visit: Payer: Self-pay | Admitting: Internal Medicine

## 2015-01-25 NOTE — Telephone Encounter (Signed)
Rx faxed to pharmacy  

## 2015-01-25 NOTE — Telephone Encounter (Signed)
Done hardcopy to Dahlia  

## 2015-01-28 ENCOUNTER — Other Ambulatory Visit: Payer: Self-pay | Admitting: Internal Medicine

## 2015-01-29 ENCOUNTER — Other Ambulatory Visit: Payer: Self-pay

## 2015-01-29 MED ORDER — GLUCOSE BLOOD VI STRP: ORAL_STRIP | Status: DC

## 2015-01-29 MED ORDER — LANCETS MISC: Status: DC

## 2015-01-29 MED ORDER — BLOOD GLUCOSE MONITOR KIT: PACK | Status: AC

## 2015-01-29 NOTE — Telephone Encounter (Signed)
Tramadol declined as too soon  Last rx just done nov 18

## 2015-02-01 ENCOUNTER — Ambulatory Visit: Payer: Medicare PPO

## 2015-02-05 ENCOUNTER — Ambulatory Visit (INDEPENDENT_AMBULATORY_CARE_PROVIDER_SITE_OTHER): Payer: Medicare PPO | Admitting: *Deleted

## 2015-02-05 DIAGNOSIS — Z4502 Encounter for adjustment and management of automatic implantable cardiac defibrillator: Secondary | ICD-10-CM

## 2015-02-05 LAB — CUP PACEART REMOTE DEVICE CHECK
Battery Remaining Longevity: 1 mo
Brady Statistic AS VS Percent: 1 %
HIGH POWER IMPEDANCE MEASURED VALUE: 78 Ohm
HighPow Impedance: 78 Ohm
Implantable Lead Implant Date: 20100930
Implantable Lead Location: 753858
Implantable Lead Location: 753859
Implantable Lead Location: 753860
Lead Channel Impedance Value: 340 Ohm
Lead Channel Impedance Value: 530 Ohm
Lead Channel Pacing Threshold Pulse Width: 0.4 ms
Lead Channel Sensing Intrinsic Amplitude: 2 mV
Lead Channel Setting Pacing Amplitude: 2 V
Lead Channel Setting Pacing Amplitude: 2 V
Lead Channel Setting Pacing Pulse Width: 0.4 ms
Lead Channel Setting Pacing Pulse Width: 0.5 ms
Lead Channel Setting Sensing Sensitivity: 0.3 mV
MDC IDC LEAD IMPLANT DT: 20100930
MDC IDC LEAD IMPLANT DT: 20100930
MDC IDC LEAD MODEL: 7122
MDC IDC MSMT BATTERY VOLTAGE: 2.47 V
MDC IDC MSMT LEADCHNL LV PACING THRESHOLD AMPLITUDE: 1 V
MDC IDC MSMT LEADCHNL LV PACING THRESHOLD PULSEWIDTH: 0.5 ms
MDC IDC MSMT LEADCHNL RA PACING THRESHOLD AMPLITUDE: 0.75 V
MDC IDC MSMT LEADCHNL RV IMPEDANCE VALUE: 640 Ohm
MDC IDC MSMT LEADCHNL RV PACING THRESHOLD AMPLITUDE: 0.75 V
MDC IDC MSMT LEADCHNL RV PACING THRESHOLD PULSEWIDTH: 0.4 ms
MDC IDC MSMT LEADCHNL RV SENSING INTR AMPL: 12 mV
MDC IDC PG SERIAL: 709089
MDC IDC SESS DTM: 20161125103815
MDC IDC SET LEADCHNL RV PACING AMPLITUDE: 2.5 V
MDC IDC STAT BRADY AP VP PERCENT: 1.4 %
MDC IDC STAT BRADY AP VS PERCENT: 1 %
MDC IDC STAT BRADY AS VP PERCENT: 99 %
MDC IDC STAT BRADY RA PERCENT PACED: 1.3 %

## 2015-02-06 ENCOUNTER — Telehealth: Payer: Self-pay

## 2015-02-06 ENCOUNTER — Telehealth: Payer: Self-pay | Admitting: Internal Medicine

## 2015-02-06 NOTE — Progress Notes (Signed)
Remote ICD transmission.   

## 2015-02-06 NOTE — Telephone Encounter (Signed)
Patient called regarding glucose blood (COOL BLOOD GLUCOSE TEST STRIPS) test strip [[id number]] script. She states that [employer] order will not fill the script unless we call. She states that they are advising that we have to fax it. It sounds like this is a possible PA? She is without test strips at this point.  Please follow up with [facility] and then with patient

## 2015-02-06 NOTE — Telephone Encounter (Signed)
Patient called to educate on Medicare Wellness apt. LVM for the patient to call back to educate and schedule for wellness visit. Left direct line number

## 2015-02-08 ENCOUNTER — Encounter: Payer: Self-pay | Admitting: Internal Medicine

## 2015-02-08 ENCOUNTER — Telehealth: Payer: Self-pay

## 2015-02-08 NOTE — Telephone Encounter (Signed)
Call and left VM on home phone regarding need for AWV prior to January apt; Left number for call back

## 2015-02-08 NOTE — Telephone Encounter (Signed)
PA initiated via covermymeds. Key:[id number]

## 2015-02-11 ENCOUNTER — Encounter: Payer: Self-pay | Admitting: Internal Medicine

## 2015-02-11 ENCOUNTER — Ambulatory Visit (INDEPENDENT_AMBULATORY_CARE_PROVIDER_SITE_OTHER): Payer: Medicare PPO | Admitting: Internal Medicine

## 2015-02-11 VITALS — BP 136/84 | HR 76 | Ht 63.0 in | Wt 165.8 lb

## 2015-02-11 DIAGNOSIS — I428 Other cardiomyopathies: Secondary | ICD-10-CM

## 2015-02-11 DIAGNOSIS — Z4502 Encounter for adjustment and management of automatic implantable cardiac defibrillator: Secondary | ICD-10-CM | POA: Diagnosis not present

## 2015-02-11 DIAGNOSIS — I519 Heart disease, unspecified: Secondary | ICD-10-CM

## 2015-02-11 DIAGNOSIS — I429 Cardiomyopathy, unspecified: Secondary | ICD-10-CM

## 2015-02-11 DIAGNOSIS — Z95810 Presence of automatic (implantable) cardiac defibrillator: Secondary | ICD-10-CM

## 2015-02-11 DIAGNOSIS — R0602 Shortness of breath: Secondary | ICD-10-CM

## 2015-02-11 DIAGNOSIS — I10 Essential (primary) hypertension: Secondary | ICD-10-CM

## 2015-02-11 DIAGNOSIS — I447 Left bundle-branch block, unspecified: Secondary | ICD-10-CM

## 2015-02-11 LAB — CUP PACEART INCLINIC DEVICE CHECK
Battery Voltage: 2.45 V
Brady Statistic RA Percent Paced: 1.2 %
Date Time Interrogation Session: 20161205211251
HighPow Impedance: 82.125
Implantable Lead Implant Date: 20100930
Implantable Lead Location: 753860
Lead Channel Impedance Value: 512.5 Ohm
Lead Channel Impedance Value: 712.5 Ohm
Lead Channel Pacing Threshold Pulse Width: 0.4 ms
Lead Channel Pacing Threshold Pulse Width: 0.5 ms
Lead Channel Sensing Intrinsic Amplitude: 12 mV
Lead Channel Sensing Intrinsic Amplitude: 2.4 mV
Lead Channel Setting Pacing Amplitude: 2 V
Lead Channel Setting Pacing Amplitude: 2.5 V
Lead Channel Setting Pacing Pulse Width: 0.5 ms
MDC IDC LEAD IMPLANT DT: 20100930
MDC IDC LEAD IMPLANT DT: 20100930
MDC IDC LEAD LOCATION: 753858
MDC IDC LEAD LOCATION: 753859
MDC IDC LEAD MODEL: 7122
MDC IDC MSMT BATTERY REMAINING LONGEVITY: 0.1
MDC IDC MSMT LEADCHNL LV PACING THRESHOLD AMPLITUDE: 1 V
MDC IDC MSMT LEADCHNL RA IMPEDANCE VALUE: 350 Ohm
MDC IDC MSMT LEADCHNL RA PACING THRESHOLD AMPLITUDE: 0.75 V
MDC IDC MSMT LEADCHNL RA PACING THRESHOLD PULSEWIDTH: 0.4 ms
MDC IDC MSMT LEADCHNL RV PACING THRESHOLD AMPLITUDE: 0.75 V
MDC IDC SET LEADCHNL RA PACING AMPLITUDE: 2 V
MDC IDC SET LEADCHNL RV PACING PULSEWIDTH: 0.4 ms
MDC IDC SET LEADCHNL RV SENSING SENSITIVITY: 0.3 mV
MDC IDC STAT BRADY RV PERCENT PACED: 99.95 %
Pulse Gen Serial Number: 709089
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
## 2015-02-11 NOTE — Patient Instructions (Addendum)
Medication Instructions:  Your physician recommends that you continue on your current medications as directed. Please refer to the Current Medication list given to you today.   Labwork: Your physician recommends that you return for lab work today BMP/CBC   Testing/Procedures: Your physician has requested that you have an echocardiogram. Echocardiography is a painless test that uses sound waves to create images of your heart. It provides your doctor with information about the size and shape of your heart and how well your heart's chambers and valves are working. This procedure takes approximately one hour. There are no restrictions for this procedure.  Generator change for device scheduled: 02/21/15  Please check in at the [facility] at 10:00am.  Do not eat or drink after midnight.  Do not take any medications the morning of your procedure  Follow-Up:  Your physician recommends that you schedule a follow-up appointment in: 10-14 days from 02/21/15 with device clinic for wound check and 3 months from 02/22/15 with Dr Rayann Heman    Any Other Special Instructions Will Be Listed Below (If Applicable). Your physician discussed the hazards of tobacco use. Tobacco use cessation is recommended and techniques and options to help you quit were discussed.      If you need a refill on your cardiac medications before your next appointment, please call your pharmacy.

## 2015-02-12 ENCOUNTER — Other Ambulatory Visit: Payer: Self-pay

## 2015-02-12 MED ORDER — GLUCOSE BLOOD VI STRP: ORAL_STRIP | Status: DC

## 2015-02-12 NOTE — Telephone Encounter (Signed)
Attempted 3 rd outreach for AWV as this patient has many health issues/ Will not plan to make another fup call;

## 2015-02-19 ENCOUNTER — Telehealth: Payer: Self-pay | Admitting: Internal Medicine

## 2015-02-19 NOTE — Telephone Encounter (Signed)
Returned patient call Instructions on pre-procedure note tells her nothing to eat or drink after midnight Procedure set for 1230; told to be there at [place] her not to take the insulin shot the day of procedure since she will have nothing to ear or drink from mid-night on until after noon on procedure day/ Patient very nervous about procedure; reassurance given

## 2015-02-19 NOTE — Telephone Encounter (Signed)
New problem   Pt having her battery changed on 12.15.16 and need to know if she can take her insulin shot the day of. Please advise.

## 2015-02-21 ENCOUNTER — Encounter (HOSPITAL_COMMUNITY)
Admission: RE | Disposition: A | Discharge status: Home / Self Care | Payer: Medicare PPO | Source: Ambulatory Visit | Attending: Internal Medicine

## 2015-02-21 ENCOUNTER — Ambulatory Visit (HOSPITAL_COMMUNITY)
Admission: RE | Admit: 2015-02-21 | Discharge: 2015-02-21 | Disposition: A | Discharge status: Home / Self Care | Payer: Medicare PPO | Source: Ambulatory Visit | Attending: Internal Medicine | Admitting: Internal Medicine

## 2015-02-21 ENCOUNTER — Other Ambulatory Visit: Payer: Self-pay | Admitting: Internal Medicine

## 2015-02-21 DIAGNOSIS — F329 Major depressive disorder, single episode, unspecified: Secondary | ICD-10-CM | POA: Diagnosis not present

## 2015-02-21 DIAGNOSIS — F17200 Nicotine dependence, unspecified, uncomplicated: Secondary | ICD-10-CM | POA: Diagnosis not present

## 2015-02-21 DIAGNOSIS — I5022 Chronic systolic (congestive) heart failure: Secondary | ICD-10-CM | POA: Diagnosis not present

## 2015-02-21 DIAGNOSIS — Z79899 Other long term (current) drug therapy: Secondary | ICD-10-CM | POA: Diagnosis not present

## 2015-02-21 DIAGNOSIS — E039 Hypothyroidism, unspecified: Secondary | ICD-10-CM | POA: Diagnosis not present

## 2015-02-21 DIAGNOSIS — F419 Anxiety disorder, unspecified: Secondary | ICD-10-CM | POA: Diagnosis not present

## 2015-02-21 DIAGNOSIS — I519 Heart disease, unspecified: Secondary | ICD-10-CM | POA: Diagnosis present

## 2015-02-21 DIAGNOSIS — Z794 Long term (current) use of insulin: Secondary | ICD-10-CM | POA: Insufficient documentation

## 2015-02-21 DIAGNOSIS — G8929 Other chronic pain: Secondary | ICD-10-CM | POA: Diagnosis not present

## 2015-02-21 DIAGNOSIS — K219 Gastro-esophageal reflux disease without esophagitis: Secondary | ICD-10-CM | POA: Diagnosis not present

## 2015-02-21 DIAGNOSIS — Z4502 Encounter for adjustment and management of automatic implantable cardiac defibrillator: Secondary | ICD-10-CM | POA: Insufficient documentation

## 2015-02-21 DIAGNOSIS — I447 Left bundle-branch block, unspecified: Secondary | ICD-10-CM | POA: Diagnosis not present

## 2015-02-21 DIAGNOSIS — E785 Hyperlipidemia, unspecified: Secondary | ICD-10-CM | POA: Insufficient documentation

## 2015-02-21 DIAGNOSIS — K589 Irritable bowel syndrome without diarrhea: Secondary | ICD-10-CM | POA: Diagnosis not present

## 2015-02-21 DIAGNOSIS — I428 Other cardiomyopathies: Secondary | ICD-10-CM | POA: Insufficient documentation

## 2015-02-21 DIAGNOSIS — I110 Hypertensive heart disease with heart failure: Secondary | ICD-10-CM | POA: Insufficient documentation

## 2015-02-21 DIAGNOSIS — I429 Cardiomyopathy, unspecified: Secondary | ICD-10-CM | POA: Diagnosis not present

## 2015-02-21 DIAGNOSIS — Z7982 Long term (current) use of aspirin: Secondary | ICD-10-CM | POA: Insufficient documentation

## 2015-02-21 DIAGNOSIS — M545 Low back pain: Secondary | ICD-10-CM | POA: Insufficient documentation

## 2015-02-21 DIAGNOSIS — E119 Type 2 diabetes mellitus without complications: Secondary | ICD-10-CM | POA: Diagnosis not present

## 2015-02-21 HISTORY — PX: EP IMPLANTABLE DEVICE: SHX172B

## 2015-02-21 LAB — SURGICAL PCR SCREEN
MRSA, PCR: NEGATIVE
STAPHYLOCOCCUS AUREUS: NEGATIVE

## 2015-02-21 LAB — BASIC METABOLIC PANEL
ANION GAP: 10 (ref 5–15)
BUN: 11 mg/dL (ref 6–20)
CALCIUM: 9.5 mg/dL (ref 8.9–10.3)
CO2: 24 mmol/L (ref 22–32)
Chloride: 107 mmol/L (ref 101–111)
Creatinine, Ser: 0.7 mg/dL (ref 0.44–1.00)
GLUCOSE: 151 mg/dL — AB (ref 65–99)
POTASSIUM: 3.8 mmol/L (ref 3.5–5.1)
SODIUM: 141 mmol/L (ref 135–145)

## 2015-02-21 LAB — CBC
HCT: 39.3 % (ref 36.0–46.0)
Hemoglobin: 12.6 g/dL (ref 12.0–15.0)
MCH: 28.6 pg (ref 26.0–34.0)
MCHC: 32.1 g/dL (ref 30.0–36.0)
MCV: 89.1 fL (ref 78.0–100.0)
PLATELETS: 221 10*3/uL (ref 150–400)
RBC: 4.41 MIL/uL (ref 3.87–5.11)
RDW: 14.7 % (ref 11.5–15.5)
WBC: 9.2 10*3/uL (ref 4.0–10.5)

## 2015-02-21 LAB — GLUCOSE, CAPILLARY: GLUCOSE-CAPILLARY: 140 mg/dL — AB (ref 65–99)

## 2015-02-21 SURGERY — ICD/BIV ICD GENERATOR CHANGEOUT
Anesthesia: LOCAL

## 2015-02-21 MED ORDER — MUPIROCIN 2 % EX OINT
TOPICAL_OINTMENT | Freq: Two times a day (BID) | CUTANEOUS | Status: DC
  Administered 2015-02-21: 11:00:00 via NASAL
  Filled 2015-02-21: qty 22

## 2015-02-21 MED ORDER — SODIUM CHLORIDE 0.9 % IR SOLN
Status: AC
  Filled 2015-02-21: qty 2

## 2015-02-21 MED ORDER — SODIUM CHLORIDE 0.9 % IV SOLN: 250.0000 mL | INTRAVENOUS | Status: DC | PRN

## 2015-02-21 MED ORDER — VANCOMYCIN HCL IN DEXTROSE 1-5 GM/200ML-% IV SOLN
1000.0000 mg | INTRAVENOUS | Status: AC
Start: 2015-02-21 — End: 2015-02-21
  Administered 2015-02-21: 1000 mg via INTRAVENOUS
  Filled 2015-02-21: qty 200

## 2015-02-21 MED ORDER — ONDANSETRON HCL 4 MG/2ML IJ SOLN: 4.0000 mg | Freq: Four times a day (QID) | INTRAMUSCULAR | Status: DC | PRN

## 2015-02-21 MED ORDER — LIDOCAINE HCL (PF) 1 % IJ SOLN
INTRAMUSCULAR | Status: AC
  Filled 2015-02-21: qty 30

## 2015-02-21 MED ORDER — SODIUM CHLORIDE 0.9 % IJ SOLN: 3.0000 mL | Freq: Two times a day (BID) | INTRAMUSCULAR | Status: DC

## 2015-02-21 MED ORDER — SODIUM CHLORIDE 0.9 % IV SOLN
INTRAVENOUS | Status: DC
Start: 2015-02-21 — End: 2015-02-21
  Administered 2015-02-21: 11:00:00 via INTRAVENOUS

## 2015-02-21 MED ORDER — MUPIROCIN 2 % EX OINT
TOPICAL_OINTMENT | CUTANEOUS | Status: AC
  Filled 2015-02-21: qty 22

## 2015-02-21 MED ORDER — SODIUM CHLORIDE 0.9 % IJ SOLN: 3.0000 mL | INTRAMUSCULAR | Status: DC | PRN

## 2015-02-21 MED ORDER — CHLORHEXIDINE GLUCONATE 4 % EX LIQD: 60.0000 mL | Freq: Once | CUTANEOUS | Status: DC

## 2015-02-21 MED ORDER — ACETAMINOPHEN 325 MG PO TABS
325.0000 mg | ORAL_TABLET | ORAL | Status: DC | PRN
  Filled 2015-02-21: qty 2

## 2015-02-21 MED ORDER — MIDAZOLAM HCL 5 MG/5ML IJ SOLN
INTRAMUSCULAR | Status: DC | PRN
  Administered 2015-02-21: 1 mg via INTRAVENOUS

## 2015-02-21 MED ORDER — FENTANYL CITRATE (PF) 100 MCG/2ML IJ SOLN
INTRAMUSCULAR | Status: AC
  Filled 2015-02-21: qty 2

## 2015-02-21 MED ORDER — MIDAZOLAM HCL 5 MG/5ML IJ SOLN
INTRAMUSCULAR | Status: AC
  Filled 2015-02-21: qty 5

## 2015-02-21 MED ORDER — SODIUM CHLORIDE 0.9 % IR SOLN: 80.0000 mg | Status: DC

## 2015-02-21 MED ORDER — FENTANYL CITRATE (PF) 100 MCG/2ML IJ SOLN
INTRAMUSCULAR | Status: DC | PRN
  Administered 2015-02-21: 12.5 ug via INTRAVENOUS

## 2015-02-21 SURGICAL SUPPLY — 4 items
CABLE SURGICAL S-101-97-12 (CABLE) ×2 IMPLANT
ICD UNIFY ASURA CRT CD3357-40C (ICD Generator) ×2 IMPLANT
PAD DEFIB LIFELINK (PAD) ×2 IMPLANT
TRAY PACEMAKER INSERTION (PACKS) ×2 IMPLANT

## 2015-02-21 NOTE — Discharge Instructions (Signed)
Pacemaker Battery Change, Care After Refer to this sheet in the next few weeks. These instructions provide you with information on caring for yourself after your procedure. Your health care provider may also give you more specific instructions. Your treatment has been planned according to current medical practices, but problems sometimes occur. Call your health care provider if you have any problems or questions after your procedure. WHAT TO EXPECT AFTER THE PROCEDURE After your procedure, it is typical to have the following sensations:  Soreness at the pacemaker site. HOME CARE INSTRUCTIONS   Keep the incision clean and dry.  Remove outer dressing tomorrow and leave steri-strips on; do not get site wet for one week  For the first week after the replacement, avoid stretching motions that pull at the incision site, and avoid heavy exercise with the arm that is on the same side as the incision.  Take medicines only as directed by your health care provider.  Keep all follow-up visits as directed by your health care provider. SEEK MEDICAL CARE IF:   You have pain at the incision site that is not relieved by over-the-counter or prescription medicine.  There is drainage or pus from the incision site.  There is swelling larger than a lime at the incision site.  You develop red streaking that extends above or below the incision site.  You feel brief, intermittent palpitations, light-headedness, or any symptoms that you feel might be related to your heart. SEEK IMMEDIATE MEDICAL CARE IF:   You experience chest pain that is different than the pain at the pacemaker site.  You experience shortness of breath.  You have palpitations or irregular heartbeat.  You have light-headedness that does not go away quickly.  You faint.  You have pain that gets worse and is not relieved by medicine.   This information is not intended to replace advice given to you by your health care provider. Make  sure you discuss any questions you have with your health care provider.   Document Released: 12/14/2012 Document Revised: 03/16/2014 Document Reviewed: 12/14/2012 Elsevier Interactive Patient Education [employer].
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
## 2015-02-21 NOTE — Interval H&P Note (Signed)
History and Physical Interval Note:  ICD Criteria  Current LVEF:32%. Within 12 months prior to implant: No   Heart failure history: Yes, Class III  Cardiomyopathy history: Yes, Non-Ischemic Cardiomyopathy.  Atrial Fibrillation/Atrial Flutter: No.  Ventricular tachycardia history: No.  Cardiac arrest history: No.  History of syndromes with risk of sudden death: No.  Previous ICD: Yes, Reason for ICD:  Primary prevention.  Current ICD indication: Primary  PPM indication: No.   Class I or II Bradycardia indication present: No  Beta Blocker therapy for 3 or more months: Yes, prescribed.   Ace Inhibitor/ARB therapy for 3 or more months: Yes, prescribed.    LBBB has CRT pacing indication      02/21/2015 12:40 PM  Carolyn [name]  has presented today for surgery, with the diagnosis of ERI  The various methods of treatment have been discussed with the patient and family. After consideration of risks, benefits and other options for treatment, the patient has consented to  Procedure(s): BIV ICD Generator Changeout (N/A) as a surgical intervention .  The patient's history has been reviewed, patient examined, no change in status, stable for surgery.  I have reviewed the patient's chart and labs.  Questions were answered to the patient's satisfaction.     Thompson Grayer

## 2015-02-22 ENCOUNTER — Encounter (HOSPITAL_COMMUNITY): Payer: Self-pay | Admitting: Internal Medicine

## 2015-02-22 MED FILL — Sodium Chloride Irrigation Soln 0.9%: Qty: 500 | Status: AC

## 2015-02-22 MED FILL — Lidocaine HCl Local Preservative Free (PF) Inj 1%: INTRAMUSCULAR | Qty: 60 | Status: AC

## 2015-02-22 MED FILL — Gentamicin Sulfate Inj 40 MG/ML: INTRAMUSCULAR | Qty: 2 | Status: AC

## 2015-02-24 ENCOUNTER — Other Ambulatory Visit: Payer: Self-pay | Admitting: Internal Medicine

## 2015-02-25 ENCOUNTER — Telehealth: Payer: Self-pay | Admitting: Internal Medicine

## 2015-02-25 NOTE — Telephone Encounter (Signed)
Spoke with patient and let her know she can take the Benadryl.  She will call me if this does not help

## 2015-02-25 NOTE — Telephone Encounter (Signed)
Ok to refill 

## 2015-02-25 NOTE — Telephone Encounter (Signed)
New message     Pt had a battery changeout last thurs.  She started itching Friday.  Today, she is still itching all over her body (no visible rash) and want to know if she can take benadryl

## 2015-02-26 ENCOUNTER — Ambulatory Visit (HOSPITAL_COMMUNITY): Payer: Medicare PPO | Attending: Internal Medicine

## 2015-02-26 ENCOUNTER — Other Ambulatory Visit: Payer: Self-pay

## 2015-02-26 DIAGNOSIS — I071 Rheumatic tricuspid insufficiency: Secondary | ICD-10-CM | POA: Diagnosis not present

## 2015-02-26 DIAGNOSIS — R0602 Shortness of breath: Secondary | ICD-10-CM | POA: Diagnosis not present

## 2015-02-26 DIAGNOSIS — E785 Hyperlipidemia, unspecified: Secondary | ICD-10-CM | POA: Diagnosis not present

## 2015-02-26 DIAGNOSIS — F17200 Nicotine dependence, unspecified, uncomplicated: Secondary | ICD-10-CM | POA: Insufficient documentation

## 2015-02-26 DIAGNOSIS — E119 Type 2 diabetes mellitus without complications: Secondary | ICD-10-CM | POA: Insufficient documentation

## 2015-02-26 DIAGNOSIS — R0600 Dyspnea, unspecified: Secondary | ICD-10-CM | POA: Diagnosis present

## 2015-02-26 DIAGNOSIS — I517 Cardiomegaly: Secondary | ICD-10-CM | POA: Insufficient documentation

## 2015-02-26 DIAGNOSIS — I10 Essential (primary) hypertension: Secondary | ICD-10-CM | POA: Insufficient documentation

## 2015-02-26 DIAGNOSIS — I272 Other secondary pulmonary hypertension: Secondary | ICD-10-CM | POA: Diagnosis not present

## 2015-02-26 DIAGNOSIS — Z95810 Presence of automatic (implantable) cardiac defibrillator: Secondary | ICD-10-CM | POA: Diagnosis not present

## 2015-02-26 NOTE — Telephone Encounter (Signed)
Done Done hardcopy to Dahlia  

## 2015-02-26 NOTE — Telephone Encounter (Signed)
Rx faxed to pharmacy  

## 2015-03-06 ENCOUNTER — Encounter: Payer: Self-pay | Admitting: Internal Medicine

## 2015-03-06 ENCOUNTER — Ambulatory Visit (INDEPENDENT_AMBULATORY_CARE_PROVIDER_SITE_OTHER): Payer: Medicare PPO | Admitting: *Deleted

## 2015-03-06 DIAGNOSIS — I519 Heart disease, unspecified: Secondary | ICD-10-CM | POA: Diagnosis not present

## 2015-03-06 DIAGNOSIS — I447 Left bundle-branch block, unspecified: Secondary | ICD-10-CM

## 2015-03-06 DIAGNOSIS — Z95810 Presence of automatic (implantable) cardiac defibrillator: Secondary | ICD-10-CM

## 2015-03-06 LAB — CUP PACEART INCLINIC DEVICE CHECK
Date Time Interrogation Session: 20161228142646
HIGH POWER IMPEDANCE MEASURED VALUE: 65.25 Ohm
Implantable Lead Implant Date: 20100930
Implantable Lead Location: 753860
Lead Channel Impedance Value: 525 Ohm
Lead Channel Pacing Threshold Amplitude: 0.75 V
Lead Channel Pacing Threshold Amplitude: 0.75 V
Lead Channel Pacing Threshold Amplitude: 1 V
Lead Channel Pacing Threshold Pulse Width: 0.5 ms
Lead Channel Pacing Threshold Pulse Width: 0.5 ms
Lead Channel Pacing Threshold Pulse Width: 0.5 ms
Lead Channel Sensing Intrinsic Amplitude: 11.6 mV
Lead Channel Sensing Intrinsic Amplitude: 2.1 mV
Lead Channel Setting Pacing Amplitude: 2 V
Lead Channel Setting Pacing Amplitude: 2.5 V
Lead Channel Setting Pacing Pulse Width: 0.5 ms
MDC IDC LEAD IMPLANT DT: 20100930
MDC IDC LEAD IMPLANT DT: 20100930
MDC IDC LEAD LOCATION: 753858
MDC IDC LEAD LOCATION: 753859
MDC IDC LEAD MODEL: 7122
MDC IDC MSMT BATTERY REMAINING LONGEVITY: 80.4
MDC IDC MSMT LEADCHNL LV PACING THRESHOLD AMPLITUDE: 1 V
MDC IDC MSMT LEADCHNL LV PACING THRESHOLD PULSEWIDTH: 0.5 ms
MDC IDC MSMT LEADCHNL RA IMPEDANCE VALUE: 350 Ohm
MDC IDC MSMT LEADCHNL RA PACING THRESHOLD AMPLITUDE: 0.75 V
MDC IDC MSMT LEADCHNL RA PACING THRESHOLD PULSEWIDTH: 0.5 ms
MDC IDC MSMT LEADCHNL RA PACING THRESHOLD PULSEWIDTH: 0.5 ms
MDC IDC MSMT LEADCHNL RV IMPEDANCE VALUE: 675 Ohm
MDC IDC MSMT LEADCHNL RV PACING THRESHOLD AMPLITUDE: 0.75 V
MDC IDC SET LEADCHNL RA PACING AMPLITUDE: 2 V
MDC IDC SET LEADCHNL RV PACING PULSEWIDTH: 0.5 ms
MDC IDC SET LEADCHNL RV SENSING SENSITIVITY: 0.5 mV
MDC IDC STAT BRADY RA PERCENT PACED: 0.32 %
MDC IDC STAT BRADY RV PERCENT PACED: 99.98 %
Pulse Gen Serial Number: 7247188

## 2015-03-06 NOTE — Progress Notes (Signed)
Changeout wound check appointment. Steri-strips removed. Wound without redness or edema. Incision edges approximated, wound well healed. Normal device function. Thresholds, sensing, and impedances consistent with implant measurements. Device programmed at chronic output settings. Changed VF NID from 12 to 24, VT NID from 20 to 30. Histogram distribution appropriate for patient and level of activity. No mode switches or ventricular arrhythmias noted. Patient educated about wound care, arm mobility, lifting restrictions, shock plan. ROV w/ JA 05/23/15.

## 2015-03-16 ENCOUNTER — Other Ambulatory Visit: Payer: Self-pay | Admitting: Internal Medicine

## 2015-03-18 ENCOUNTER — Encounter: Payer: Medicare PPO | Admitting: Internal Medicine

## 2015-03-25 ENCOUNTER — Other Ambulatory Visit (INDEPENDENT_AMBULATORY_CARE_PROVIDER_SITE_OTHER): Payer: Medicare PPO

## 2015-03-25 DIAGNOSIS — E119 Type 2 diabetes mellitus without complications: Secondary | ICD-10-CM

## 2015-03-25 LAB — BASIC METABOLIC PANEL
BUN: 10 mg/dL (ref 6–23)
CHLORIDE: 105 meq/L (ref 96–112)
CO2: 28 meq/L (ref 19–32)
CREATININE: 0.68 mg/dL (ref 0.40–1.20)
Calcium: 9 mg/dL (ref 8.4–10.5)
GFR: 90.06 mL/min (ref >60.00)
Glucose, Bld: 84 mg/dL (ref 70–99)
POTASSIUM: 4.1 meq/L (ref 3.5–5.1)
Sodium: 142 mEq/L (ref 135–145)

## 2015-03-25 LAB — HEPATIC FUNCTION PANEL
ALT: 14 U/L (ref 0–35)
AST: 16 U/L (ref 0–37)
Albumin: 4 g/dL (ref 3.5–5.2)
Alkaline Phosphatase: 46 U/L (ref 39–117)
BILIRUBIN DIRECT: 0.1 mg/dL (ref 0.0–0.3)
BILIRUBIN TOTAL: 0.4 mg/dL (ref 0.2–1.2)
TOTAL PROTEIN: 7.1 g/dL (ref 6.0–8.3)

## 2015-03-25 LAB — LIPID PANEL
Cholesterol: 159 mg/dL (ref 0–200)
HDL: 42 mg/dL (ref >39.00)
LDL Cholesterol: 84 mg/dL (ref 0–99)
NonHDL: 116.94
TRIGLYCERIDES: 165 mg/dL — AB (ref 0.0–149.0)
Total CHOL/HDL Ratio: 4
VLDL: 33 mg/dL (ref 0.0–40.0)

## 2015-03-25 LAB — HEMOGLOBIN A1C: HEMOGLOBIN A1C: 8 % — AB (ref 4.6–6.5)

## 2015-03-26 ENCOUNTER — Encounter: Payer: Self-pay | Admitting: Internal Medicine

## 2015-03-26 ENCOUNTER — Ambulatory Visit (INDEPENDENT_AMBULATORY_CARE_PROVIDER_SITE_OTHER): Payer: Medicare PPO | Admitting: Internal Medicine

## 2015-03-26 VITALS — BP 132/80 | HR 72 | Temp 98.6°F | Resp 20 | Ht 63.0 in | Wt 165.5 lb

## 2015-03-26 DIAGNOSIS — Z23 Encounter for immunization: Secondary | ICD-10-CM | POA: Diagnosis not present

## 2015-03-26 DIAGNOSIS — E785 Hyperlipidemia, unspecified: Secondary | ICD-10-CM | POA: Diagnosis not present

## 2015-03-26 DIAGNOSIS — I10 Essential (primary) hypertension: Secondary | ICD-10-CM | POA: Diagnosis not present

## 2015-03-26 DIAGNOSIS — E119 Type 2 diabetes mellitus without complications: Secondary | ICD-10-CM

## 2015-03-26 DIAGNOSIS — Z0000 Encounter for general adult medical examination without abnormal findings: Secondary | ICD-10-CM

## 2015-03-26 DIAGNOSIS — Z0189 Encounter for other specified special examinations: Secondary | ICD-10-CM

## 2015-03-26 DIAGNOSIS — Z794 Long term (current) use of insulin: Secondary | ICD-10-CM

## 2015-03-26 MED ORDER — INSULIN GLARGINE 100 UNIT/ML SOLOSTAR PEN: 37.0000 [IU] | PEN_INJECTOR | Freq: Every day | SUBCUTANEOUS | Status: DC

## 2015-03-26 MED ORDER — FUROSEMIDE 20 MG PO TABS: 20.0000 mg | ORAL_TABLET | Freq: Every day | ORAL | Status: DC

## 2015-03-26 NOTE — Assessment & Plan Note (Signed)
stable overall by history and exam, recent data reviewed with pt, and pt to continue medical treatment as before,  to f/u any worsening symptoms or concerns BP Readings from Last 3 Encounters:  03/26/15 132/80  02/21/15 167/84  02/11/15 136/84
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
## 2015-03-26 NOTE — Assessment & Plan Note (Signed)
stable overall by history and exam, recent data reviewed with pt, and pt to continue medical treatment as before,  to f/u any worsening symptoms or concerns Lab Results  Component Value Date   LDLCALC 84 03/25/2015

## 2015-03-26 NOTE — Assessment & Plan Note (Signed)
Ok to incrase the lantus to 37 units per day for goal a1c approx 7.5, Lab Results  Component Value Date   HGBA1C 8.0* 03/25/2015    to f/u any worsening symptoms or concerns

## 2015-03-26 NOTE — Progress Notes (Signed)
Pre visit review using our clinic review tool, if applicable. No additional management support is needed unless otherwise documented below in the visit note. 

## 2015-03-26 NOTE — Patient Instructions (Addendum)
OK to inc the lantus to 37 units per day  Please continue all other medications as before, and refills have been done if requested  - the lasix  You should still have refills for the xanax and tramadol  Please have the pharmacy call with any other refills you may need.  Please continue your efforts at being more active, low cholesterol diet, and weight control.  Please keep your appointments with your specialists as you may have planned  Please return in 6 months, or sooner if needed, with Lab testing done 3-5 days before
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
## 2015-03-28 ENCOUNTER — Encounter: Payer: Self-pay | Admitting: Cardiovascular Disease

## 2015-03-28 ENCOUNTER — Ambulatory Visit (INDEPENDENT_AMBULATORY_CARE_PROVIDER_SITE_OTHER): Payer: Medicare PPO | Admitting: Cardiovascular Disease

## 2015-03-28 VITALS — BP 138/82 | HR 66 | Ht 63.0 in | Wt 165.2 lb

## 2015-03-28 DIAGNOSIS — F17200 Nicotine dependence, unspecified, uncomplicated: Secondary | ICD-10-CM

## 2015-03-28 DIAGNOSIS — I428 Other cardiomyopathies: Secondary | ICD-10-CM | POA: Diagnosis not present

## 2015-03-28 DIAGNOSIS — I519 Heart disease, unspecified: Secondary | ICD-10-CM | POA: Diagnosis not present

## 2015-03-28 DIAGNOSIS — I10 Essential (primary) hypertension: Secondary | ICD-10-CM

## 2015-03-28 NOTE — Patient Instructions (Addendum)
Medication Instructions:  Resume furosemide 20 mg by mouth daily.   Labwork:  Your physician recommends that you return for lab work in: one week--January 27,2017.  The lab opens at 7:30 AM   Testing/Procedures: none  Follow-Up: Your physician wants you to follow-up in: 6 months. You will receive a reminder letter in the mail two months in advance. If you don't receive a letter, please call our office to schedule the follow-up appointment.       Any Other Special Instructions Will Be Listed Below (If Applicable).     If you need a refill on your cardiac medications before your next appointment, please call your pharmacy.

## 2015-04-05 ENCOUNTER — Other Ambulatory Visit (INDEPENDENT_AMBULATORY_CARE_PROVIDER_SITE_OTHER): Payer: Medicare PPO | Admitting: *Deleted

## 2015-04-05 DIAGNOSIS — I519 Heart disease, unspecified: Secondary | ICD-10-CM

## 2015-04-05 DIAGNOSIS — I428 Other cardiomyopathies: Secondary | ICD-10-CM

## 2015-04-05 DIAGNOSIS — I429 Cardiomyopathy, unspecified: Secondary | ICD-10-CM

## 2015-04-05 LAB — BASIC METABOLIC PANEL
BUN: 13 mg/dL (ref 7–25)
CO2: 29 mmol/L (ref 20–31)
Calcium: 8.8 mg/dL (ref 8.6–10.4)
Chloride: 100 mmol/L (ref 98–110)
Creat: 0.75 mg/dL (ref 0.60–0.93)
GLUCOSE: 99 mg/dL (ref 65–99)
POTASSIUM: 3.4 mmol/L — AB (ref 3.5–5.3)
SODIUM: 140 mmol/L (ref 135–146)

## 2015-04-05 NOTE — Addendum Note (Signed)
Addended by: Eulis Foster on: 04/05/2015 08:00 AM   Modules accepted: Orders

## 2015-04-08 ENCOUNTER — Telehealth: Payer: Self-pay | Admitting: Cardiovascular Disease

## 2015-04-08 DIAGNOSIS — I519 Heart disease, unspecified: Secondary | ICD-10-CM

## 2015-04-08 MED ORDER — POTASSIUM CHLORIDE CRYS ER 20 MEQ PO TBCR: 20.0000 meq | EXTENDED_RELEASE_TABLET | Freq: Every day | ORAL | Status: DC

## 2015-04-08 NOTE — Telephone Encounter (Signed)
New message ° ° °Pt is returning call for rn °

## 2015-04-08 NOTE — Telephone Encounter (Signed)
Spoke with pt and reviewed lab results and instructions from Dr. Angelena Form with her.  Will send prescription to [facility] on [address]. She will come in for lab work on 2/17

## 2015-04-08 NOTE — Telephone Encounter (Signed)
Lab work is 04/23/15 not 2/17

## 2015-04-14 ENCOUNTER — Other Ambulatory Visit: Payer: Self-pay | Admitting: Cardiovascular Disease

## 2015-04-23 ENCOUNTER — Other Ambulatory Visit (INDEPENDENT_AMBULATORY_CARE_PROVIDER_SITE_OTHER): Payer: Medicare PPO | Admitting: *Deleted

## 2015-04-23 DIAGNOSIS — I519 Heart disease, unspecified: Secondary | ICD-10-CM | POA: Diagnosis not present

## 2015-04-23 LAB — BASIC METABOLIC PANEL
BUN: 13 mg/dL (ref 7–25)
CALCIUM: 9.4 mg/dL (ref 8.6–10.4)
CO2: 22 mmol/L (ref 20–31)
CREATININE: 0.92 mg/dL (ref 0.60–0.93)
Chloride: 101 mmol/L (ref 98–110)
Glucose, Bld: 220 mg/dL — ABNORMAL HIGH (ref 65–99)
POTASSIUM: 3.9 mmol/L (ref 3.5–5.3)
Sodium: 134 mmol/L — ABNORMAL LOW (ref 135–146)

## 2015-04-24 ENCOUNTER — Telehealth: Payer: Self-pay | Admitting: *Deleted

## 2015-04-24 NOTE — Telephone Encounter (Signed)
I called pt and reviewed BMP results with her.  She reports she was able to take potassium for 9 days and then developed nausea, vomiting and diarrhea. Thinks it was caused by potassium. She stopped potassium after dose on 2/10. Lab work done on 2/14.  I asked her to resume potassium and if she develops nausea, vomiting and diarrhea again to let us know.  Pt agreeable with this plan.

## 2015-04-26 ENCOUNTER — Telehealth: Payer: Self-pay | Admitting: Cardiovascular Disease

## 2015-04-26 NOTE — Telephone Encounter (Signed)
Spoke with pt. She resumed potassium yesterday and vomited during the night. She states she cannot take potassium.  She states she will eat a banana every day once her stomach feels better.  I told her I would make Dr. Angelena Form aware.

## 2015-04-26 NOTE — Telephone Encounter (Signed)
Returning your call. °

## 2015-04-29 NOTE — Telephone Encounter (Signed)
Ok thanks 

## 2015-04-30 ENCOUNTER — Other Ambulatory Visit: Payer: Self-pay | Admitting: Internal Medicine

## 2015-04-30 NOTE — Telephone Encounter (Signed)
Medication printed signed and faxed to pharmacy  

## 2015-04-30 NOTE — Telephone Encounter (Signed)
Please advise, patient is needing refill on tramadol

## 2015-04-30 NOTE — Telephone Encounter (Signed)
Done hardcopy to Corinne  

## 2015-05-01 ENCOUNTER — Other Ambulatory Visit: Payer: Self-pay | Admitting: Internal Medicine

## 2015-05-01 NOTE — Telephone Encounter (Signed)
Tramadol too soon since rx just done feb 17

## 2015-05-01 NOTE — Telephone Encounter (Signed)
Please advise, patient is requesting refill on tramadol

## 2015-05-05 ENCOUNTER — Other Ambulatory Visit: Payer: Self-pay | Admitting: Internal Medicine

## 2015-05-06 ENCOUNTER — Other Ambulatory Visit: Payer: Self-pay | Admitting: Internal Medicine

## 2015-05-09 ENCOUNTER — Other Ambulatory Visit: Payer: Self-pay | Admitting: Internal Medicine

## 2015-05-23 ENCOUNTER — Encounter: Payer: Medicare PPO | Admitting: Internal Medicine

## 2015-06-26 ENCOUNTER — Other Ambulatory Visit: Payer: Self-pay | Admitting: Internal Medicine

## 2015-06-26 NOTE — Telephone Encounter (Signed)
Medication sent.

## 2015-06-26 NOTE — Telephone Encounter (Signed)
Done hardcopy to Corinne  

## 2015-06-26 NOTE — Telephone Encounter (Signed)
Please advise 

## 2015-07-05 ENCOUNTER — Encounter: Payer: Self-pay | Admitting: Internal Medicine

## 2015-07-05 ENCOUNTER — Encounter: Payer: Medicare PPO | Admitting: Internal Medicine

## 2015-07-05 ENCOUNTER — Ambulatory Visit (INDEPENDENT_AMBULATORY_CARE_PROVIDER_SITE_OTHER): Payer: Medicare PPO | Admitting: Internal Medicine

## 2015-07-05 VITALS — BP 146/80 | HR 77 | Ht 60.0 in | Wt 162.4 lb

## 2015-07-05 DIAGNOSIS — I10 Essential (primary) hypertension: Secondary | ICD-10-CM

## 2015-07-05 DIAGNOSIS — I519 Heart disease, unspecified: Secondary | ICD-10-CM | POA: Diagnosis not present
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
## 2015-07-05 NOTE — Patient Instructions (Signed)
Medication Instructions:  Your physician recommends that you continue on your current medications as directed. Please refer to the Current Medication list given to you today.   Labwork: None ordered   Testing/Procedures: None ordered   Follow-Up: Your physician wants you to follow-up in: 12 months with Dr Rayann Heman Dennis Bast will receive a reminder letter in the mail two months in advance. If you don't receive a letter, please call our office to schedule the follow-up appointment.   Remote monitoring is used to monitor your ICD from home. This monitoring reduces the number of office visits required to check your device to one time per year. It allows [place] to keep an eye on the functioning of your device to ensure it is working properly. You are scheduled for a device check from home on 10/07/15. You may send your transmission at any time that day. If you have a wireless device, the transmission will be sent automatically. After your physician reviews your transmission, you will receive a postcard with your next transmission date.    Any Other Special Instructions Will Be Listed Below (If Applicable).     If you need a refill on your cardiac medications before your next appointment, please call your pharmacy.

## 2015-07-10 DIAGNOSIS — R350 Frequency of micturition: Secondary | ICD-10-CM | POA: Diagnosis not present

## 2015-07-10 DIAGNOSIS — R1031 Right lower quadrant pain: Secondary | ICD-10-CM | POA: Diagnosis not present

## 2015-07-10 DIAGNOSIS — R300 Dysuria: Secondary | ICD-10-CM | POA: Diagnosis not present

## 2015-07-10 DIAGNOSIS — Z0000 Encounter for general adult medical examination without abnormal findings: Secondary | ICD-10-CM | POA: Diagnosis not present

## 2015-07-22 DIAGNOSIS — M47816 Spondylosis without myelopathy or radiculopathy, lumbar region: Secondary | ICD-10-CM | POA: Diagnosis not present

## 2015-07-22 DIAGNOSIS — M545 Low back pain: Secondary | ICD-10-CM | POA: Diagnosis not present

## 2015-07-23 ENCOUNTER — Other Ambulatory Visit: Payer: Self-pay | Admitting: Internal Medicine

## 2015-08-20 ENCOUNTER — Other Ambulatory Visit: Payer: Self-pay | Admitting: Internal Medicine

## 2015-08-20 NOTE — Telephone Encounter (Signed)
Done hardcopy to Corinne  

## 2015-08-20 NOTE — Telephone Encounter (Signed)
Please advise 

## 2015-08-21 DIAGNOSIS — H40033 Anatomical narrow angle, bilateral: Secondary | ICD-10-CM | POA: Diagnosis not present

## 2015-08-21 DIAGNOSIS — H2513 Age-related nuclear cataract, bilateral: Secondary | ICD-10-CM | POA: Diagnosis not present

## 2015-08-21 NOTE — Telephone Encounter (Signed)
Medication faxed to pharmacy 

## 2015-08-27 ENCOUNTER — Other Ambulatory Visit: Payer: Self-pay | Admitting: Internal Medicine

## 2015-08-27 NOTE — Telephone Encounter (Signed)
Please advise, thanks.

## 2015-08-28 ENCOUNTER — Encounter: Payer: Medicare PPO | Admitting: Internal Medicine

## 2015-08-28 NOTE — Telephone Encounter (Signed)
Tramadol is too soon to refill, since the last rx was just June 13  ? Maybe pt was not notified that rx was done?

## 2015-09-06 ENCOUNTER — Other Ambulatory Visit: Payer: Self-pay | Admitting: Internal Medicine

## 2015-09-06 NOTE — Telephone Encounter (Signed)
Done hardcopy to Corinne  

## 2015-09-06 NOTE — Telephone Encounter (Signed)
Please advise 

## 2015-09-06 NOTE — Telephone Encounter (Signed)
Medication faxed to pharmacy 

## 2015-09-23 ENCOUNTER — Other Ambulatory Visit (INDEPENDENT_AMBULATORY_CARE_PROVIDER_SITE_OTHER): Payer: Medicare PPO

## 2015-09-23 DIAGNOSIS — Z794 Long term (current) use of insulin: Secondary | ICD-10-CM

## 2015-09-23 DIAGNOSIS — Z0189 Encounter for other specified special examinations: Secondary | ICD-10-CM | POA: Diagnosis not present

## 2015-09-23 DIAGNOSIS — Z0000 Encounter for general adult medical examination without abnormal findings: Secondary | ICD-10-CM

## 2015-09-23 DIAGNOSIS — E119 Type 2 diabetes mellitus without complications: Secondary | ICD-10-CM

## 2015-09-23 LAB — URINALYSIS, ROUTINE W REFLEX MICROSCOPIC
BILIRUBIN URINE: NEGATIVE
HGB URINE DIPSTICK: NEGATIVE
KETONES UR: NEGATIVE
LEUKOCYTES UA: NEGATIVE
NITRITE: NEGATIVE
RBC / HPF: NONE SEEN (ref 0–?)
Specific Gravity, Urine: <=1.005 — AB (ref 1.000–1.030)
TOTAL PROTEIN, URINE-UPE24: NEGATIVE
URINE GLUCOSE: NEGATIVE
UROBILINOGEN UA: 0.2 (ref 0.0–1.0)
pH: 5 (ref 5.0–8.0)

## 2015-09-23 LAB — BASIC METABOLIC PANEL
BUN: 15 mg/dL (ref 6–23)
CHLORIDE: 100 meq/L (ref 96–112)
CO2: 30 mEq/L (ref 19–32)
CREATININE: 0.79 mg/dL (ref 0.40–1.20)
Calcium: 9.6 mg/dL (ref 8.4–10.5)
GFR: 75.64 mL/min (ref >60.00)
GLUCOSE: 55 mg/dL — AB (ref 70–99)
POTASSIUM: 3.7 meq/L (ref 3.5–5.1)
Sodium: 137 mEq/L (ref 135–145)

## 2015-09-23 LAB — HEPATIC FUNCTION PANEL
ALBUMIN: 4.3 g/dL (ref 3.5–5.2)
ALT: 12 U/L (ref 0–35)
AST: 16 U/L (ref 0–37)
Alkaline Phosphatase: 45 U/L (ref 39–117)
Bilirubin, Direct: 0.1 mg/dL (ref 0.0–0.3)
Total Bilirubin: 0.6 mg/dL (ref 0.2–1.2)
Total Protein: 7.4 g/dL (ref 6.0–8.3)

## 2015-09-23 LAB — LIPID PANEL
CHOLESTEROL: 166 mg/dL (ref 0–200)
HDL: 45.1 mg/dL (ref >39.00)
LDL CALC: 95 mg/dL (ref 0–99)
NonHDL: 121.16
TRIGLYCERIDES: 133 mg/dL (ref 0.0–149.0)
Total CHOL/HDL Ratio: 4
VLDL: 26.6 mg/dL (ref 0.0–40.0)

## 2015-09-23 LAB — MICROALBUMIN / CREATININE URINE RATIO
CREATININE, U: 25.9 mg/dL
Microalb Creat Ratio: 2.7 mg/g (ref 0.0–30.0)
Microalb, Ur: <0.7 mg/dL (ref 0.0–1.9)

## 2015-09-23 LAB — TSH: TSH: 2.06 u[IU]/mL (ref 0.35–4.50)

## 2015-09-23 LAB — HEMOGLOBIN A1C: Hgb A1c MFr Bld: 7 % — ABNORMAL HIGH (ref 4.6–6.5)

## 2015-09-24 ENCOUNTER — Encounter: Payer: Self-pay | Admitting: Internal Medicine

## 2015-09-24 ENCOUNTER — Ambulatory Visit (INDEPENDENT_AMBULATORY_CARE_PROVIDER_SITE_OTHER): Payer: Medicare PPO | Admitting: Internal Medicine

## 2015-09-24 VITALS — BP 138/80 | HR 81 | Temp 98.4°F | Resp 20 | Wt 164.0 lb

## 2015-09-24 DIAGNOSIS — E119 Type 2 diabetes mellitus without complications: Secondary | ICD-10-CM

## 2015-09-24 DIAGNOSIS — E785 Hyperlipidemia, unspecified: Secondary | ICD-10-CM | POA: Diagnosis not present

## 2015-09-24 DIAGNOSIS — Z0001 Encounter for general adult medical examination with abnormal findings: Secondary | ICD-10-CM

## 2015-09-24 DIAGNOSIS — M67442 Ganglion, left hand: Secondary | ICD-10-CM | POA: Diagnosis not present

## 2015-09-24 DIAGNOSIS — Z794 Long term (current) use of insulin: Secondary | ICD-10-CM

## 2015-09-24 DIAGNOSIS — R6889 Other general symptoms and signs: Secondary | ICD-10-CM

## 2015-09-24 DIAGNOSIS — I10 Essential (primary) hypertension: Secondary | ICD-10-CM | POA: Diagnosis not present

## 2015-09-24 DIAGNOSIS — M549 Dorsalgia, unspecified: Secondary | ICD-10-CM | POA: Insufficient documentation

## 2015-09-24 DIAGNOSIS — M546 Pain in thoracic spine: Secondary | ICD-10-CM

## 2015-09-24 LAB — CBC WITH DIFFERENTIAL/PLATELET
Basophils Absolute: 0 10*3/uL (ref 0.0–0.1)
Basophils Relative: 0.1 % (ref 0.0–3.0)
EOS ABS: 0.1 10*3/uL (ref 0.0–0.7)
Eosinophils Relative: 1.3 % (ref 0.0–5.0)
HCT: 37.9 % (ref 36.0–46.0)
HEMOGLOBIN: 12.5 g/dL (ref 12.0–15.0)
LYMPHS PCT: 29 % (ref 12.0–46.0)
Lymphs Abs: 3.3 10*3/uL (ref 0.7–4.0)
MCHC: 33 g/dL (ref 30.0–36.0)
MCV: 89.9 fl (ref 78.0–100.0)
MONO ABS: 0.8 10*3/uL (ref 0.1–1.0)
Monocytes Relative: 7.5 % (ref 3.0–12.0)
Neutro Abs: 7 10*3/uL (ref 1.4–7.7)
Neutrophils Relative %: 62.1 % (ref 43.0–77.0)
Platelets: 242 10*3/uL (ref 150.0–400.0)
RBC: 4.22 Mil/uL (ref 3.87–5.11)
RDW: 14.1 % (ref 11.5–15.5)
WBC: 11.3 10*3/uL — AB (ref 4.0–10.5)

## 2015-09-24 MED ORDER — TIZANIDINE HCL 4 MG PO TABS: 4.0000 mg | ORAL_TABLET | Freq: Four times a day (QID) | ORAL | Status: DC | PRN

## 2015-09-24 NOTE — Assessment & Plan Note (Signed)
stable overall by history and exam, recent data reviewed with pt, and pt to continue medical treatment as before,  to f/u any worsening symptoms or concerns BP Readings from Last 3 Encounters:  09/24/15 138/80  07/05/15 146/80  03/28/15 138/82

## 2015-09-24 NOTE — Assessment & Plan Note (Signed)
stable overall by history and exam, recent data reviewed with pt, and pt to continue medical treatment as before,  to f/u any worsening symptoms or concerns Lab Results  Component Value Date   HGBA1C 7.0* 09/23/2015
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
## 2015-09-24 NOTE — Progress Notes (Signed)
Pre visit review using our clinic review tool, if applicable. No additional management support is needed unless otherwise documented below in the visit note. 

## 2015-09-24 NOTE — Assessment & Plan Note (Signed)
Right thoracic periscapular (different from lumbar) - for muscle relaxer prn

## 2015-09-24 NOTE — Assessment & Plan Note (Signed)
stable overall by history and exam, recent data reviewed with pt, and pt to continue medical treatment as before,  to f/u any worsening symptoms or concerns Lab Results  Component Value Date   [place] 95 09/23/2015

## 2015-09-24 NOTE — Assessment & Plan Note (Signed)
[place] for hand surgury referral  In addition to the time spent performing CPE, I spent an additional 25 minutes face to face,in which greater than 50% of this time was spent in counseling and coordination of care for patient's acute illness as documented.

## 2015-09-24 NOTE — Assessment & Plan Note (Signed)

## 2015-09-24 NOTE — Patient Instructions (Signed)
Please take all new medication as prescribed - the muscle relaxer  Please continue all other medications as before, and refills have been done if requested.  Please have the pharmacy call with any other refills you may need.  Please continue your efforts at being more active, low cholesterol diet, and weight control.  You are otherwise up to date with prevention measures today.  Please keep your appointments with your specialists as you may have planned  You will be contacted regarding the referral for: Hand Surgeon  Please return in 6 months, or sooner if needed, with Lab testing done 3-5 days before

## 2015-09-30 ENCOUNTER — Other Ambulatory Visit: Payer: Self-pay | Admitting: Internal Medicine

## 2015-09-30 NOTE — Telephone Encounter (Signed)
Patient called to advise that she intended for tiZANidine (ZANAFLEX) 4 MG tablet [[id number]]  [facility]

## 2015-10-01 MED ORDER — TIZANIDINE HCL 4 MG PO TABS: 4.0000 mg | ORAL_TABLET | Freq: Four times a day (QID) | ORAL | 2 refills | Status: DC | PRN

## 2015-10-01 NOTE — Telephone Encounter (Signed)
Medication refill sent to pharmacy  

## 2015-10-01 NOTE — Telephone Encounter (Signed)
.  xanax Done hardcopy to [facility]  Other done erx

## 2015-10-02 ENCOUNTER — Telehealth: Payer: Self-pay | Admitting: Emergency Medicine

## 2015-10-02 NOTE — Telephone Encounter (Signed)
Ok to cont to see Copy and not go to ER or UC now if no drainage , worsening redness, sweling or fever

## 2015-10-02 NOTE — Telephone Encounter (Signed)
Pt called and stated she was seen by Dr Jenny Reichmann. He is referring her to a hand Specialist. Monday she started noticing the meat coming off her hand and you can see the pink meat under the knot. The meat is still peeling off. She wanted to know what she needs to do or if she needs to be seen. Please follow up thanks.

## 2015-10-03 ENCOUNTER — Other Ambulatory Visit: Payer: Self-pay | Admitting: Internal Medicine

## 2015-10-03 NOTE — Telephone Encounter (Signed)
Called patient, left message to give [place] a call back

## 2015-10-04 ENCOUNTER — Telehealth: Payer: Self-pay | Admitting: Cardiology

## 2015-10-04 ENCOUNTER — Ambulatory Visit (INDEPENDENT_AMBULATORY_CARE_PROVIDER_SITE_OTHER): Payer: Medicare PPO | Admitting: *Deleted

## 2015-10-04 DIAGNOSIS — I429 Cardiomyopathy, unspecified: Secondary | ICD-10-CM | POA: Diagnosis not present

## 2015-10-04 DIAGNOSIS — I428 Other cardiomyopathies: Secondary | ICD-10-CM

## 2015-10-04 NOTE — Progress Notes (Signed)
Remote ICD transmission.   

## 2015-10-04 NOTE — Telephone Encounter (Signed)
Spoke with pt and reminded pt of remote transmission that is due today. Pt verbalized understanding.   

## 2015-10-06 IMAGING — DX DG CHEST 2V
2 series · 2 of 2 positions shown · non-contrast
Comparison: July 15, 2012

CLINICAL DATA: Cough and shortness of breath

EXAM:
CHEST  2 VIEW

[w chest pa]
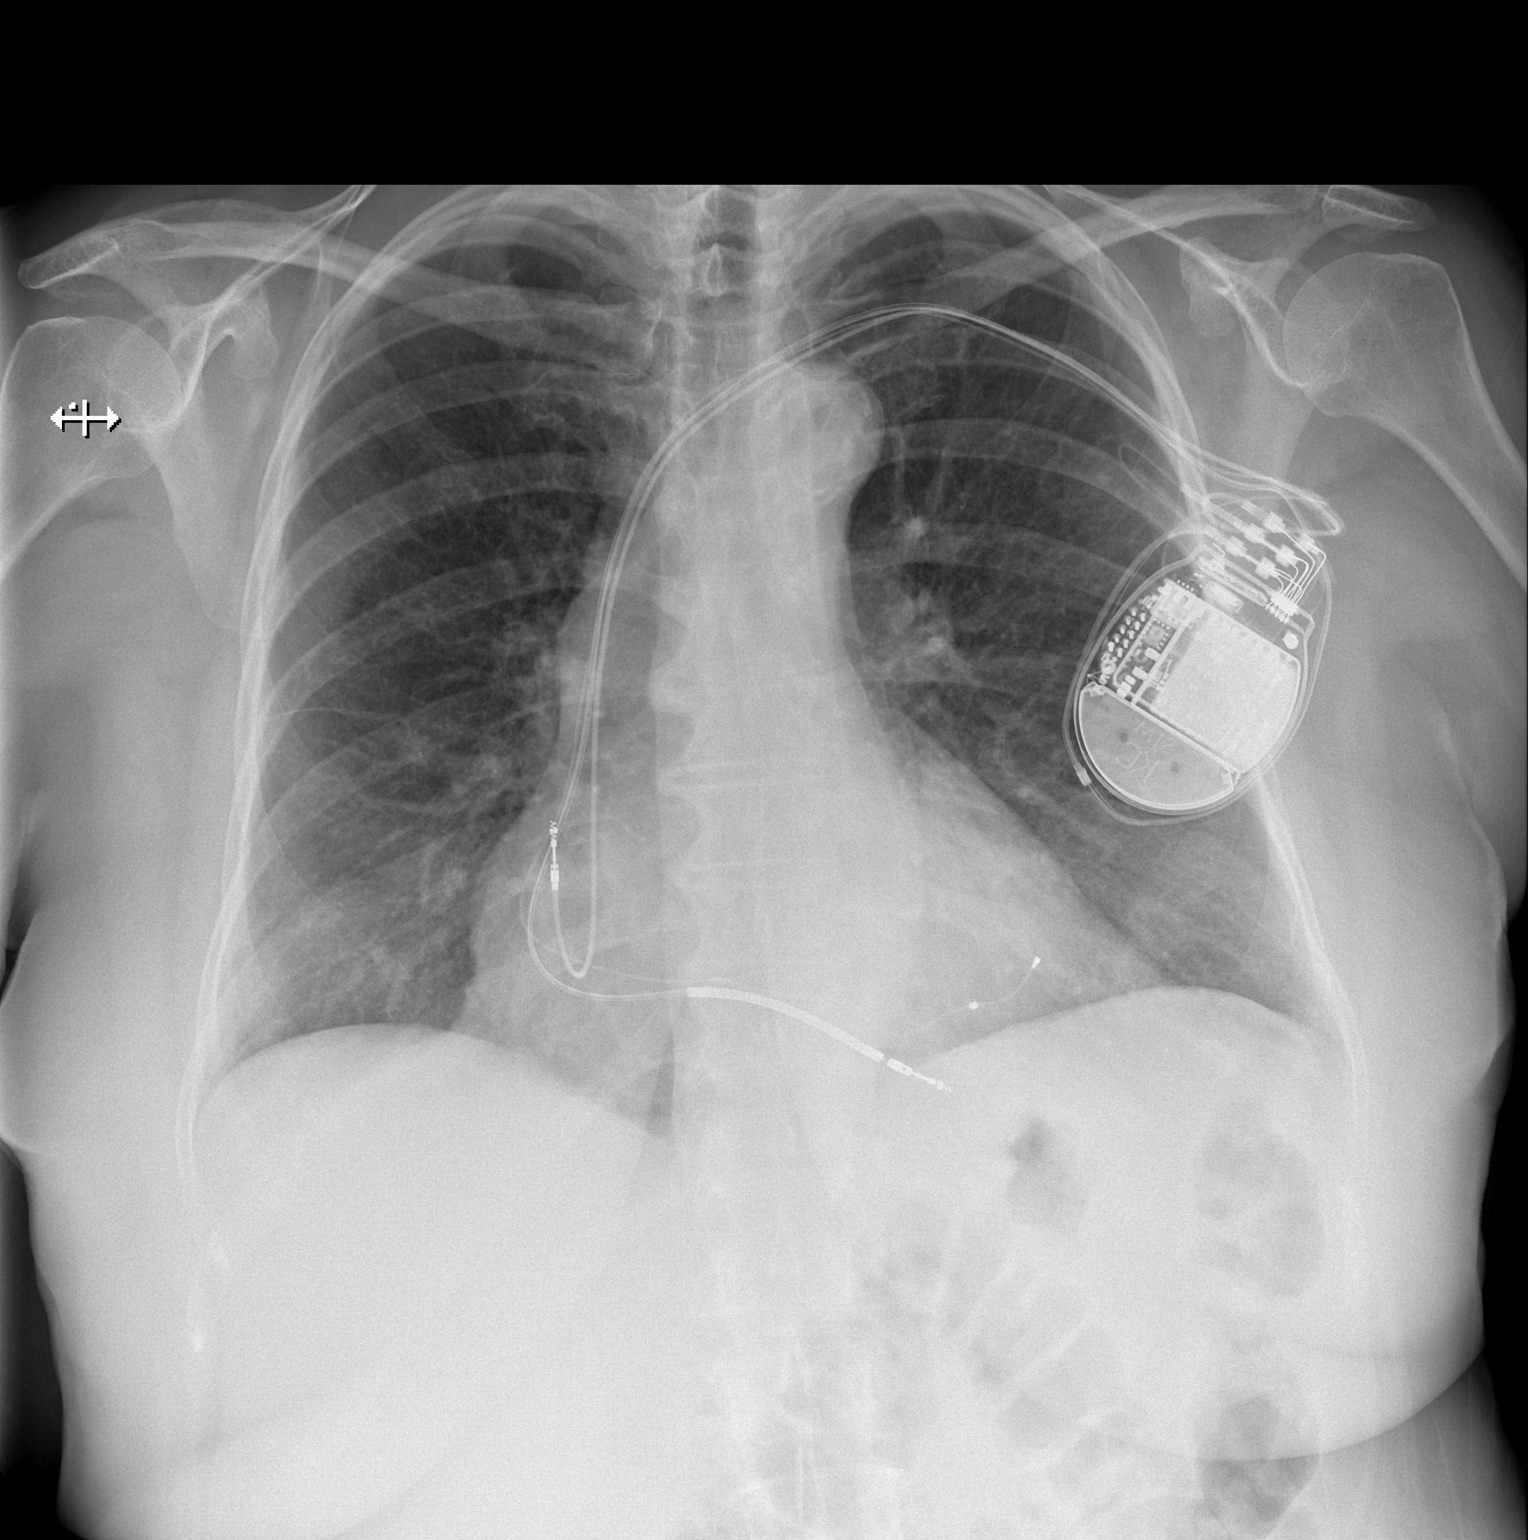

[w chest lat]
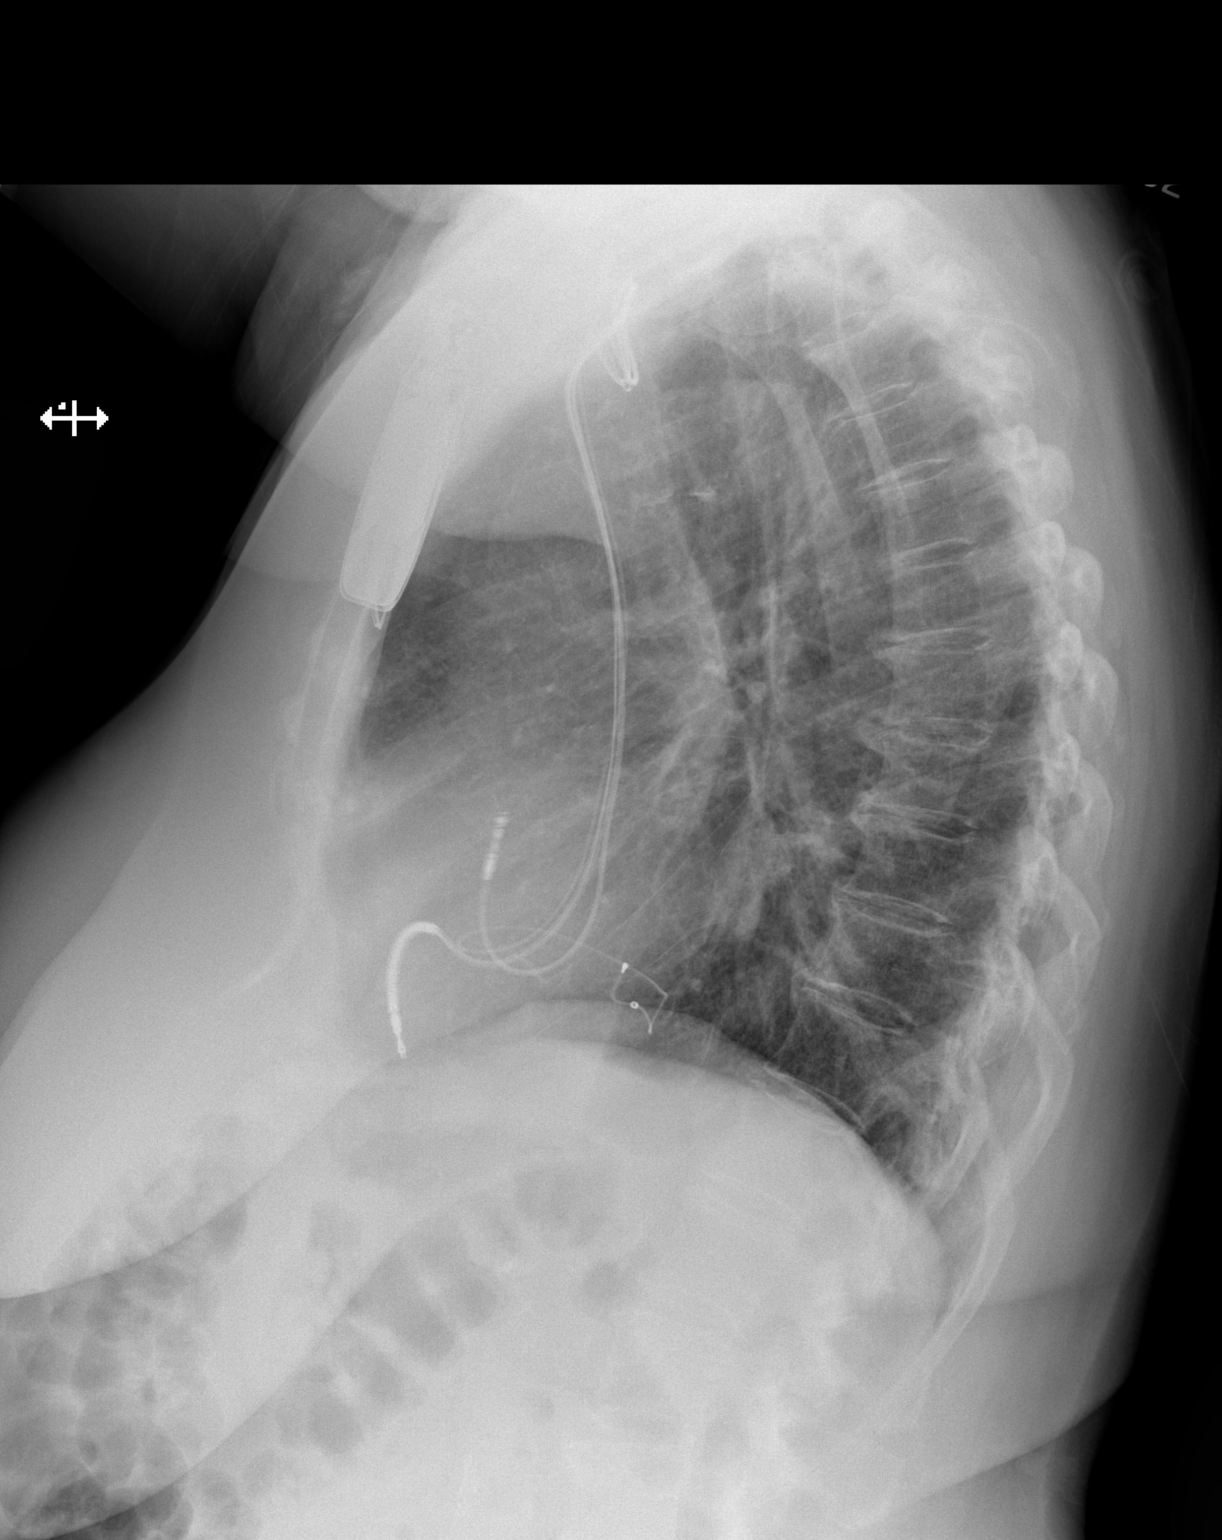

[2 of 2 positions shown; findings below may reference images not displayed]

FINDINGS: The heart size and mediastinal contours are stable. Cardiac
pacemaker is not changed. Both lungs are clear. The visualized
skeletal structures are stable.
IMPRESSION: No active cardiopulmonary disease.

## 2015-10-07 ENCOUNTER — Encounter: Payer: Self-pay | Admitting: Cardiology

## 2015-10-11 DIAGNOSIS — R229 Localized swelling, mass and lump, unspecified: Secondary | ICD-10-CM | POA: Diagnosis not present

## 2015-10-11 DIAGNOSIS — R2232 Localized swelling, mass and lump, left upper limb: Secondary | ICD-10-CM | POA: Diagnosis not present

## 2015-10-11 LAB — CUP PACEART REMOTE DEVICE CHECK
Battery Remaining Longevity: 74 mo
Battery Remaining Percentage: 87 %
Battery Voltage: 3.05 V
Brady Statistic AP VP Percent: 2.4 %
Brady Statistic AP VS Percent: 1 %
Brady Statistic AS VP Percent: 97 %
Brady Statistic AS VS Percent: 1 %
Brady Statistic RA Percent Paced: 2.3 %
Date Time Interrogation Session: 20170728134605
HighPow Impedance: 73 Ohm
HighPow Impedance: 73 Ohm
Implantable Lead Implant Date: 20100930
Implantable Lead Implant Date: 20100930
Implantable Lead Implant Date: 20100930
Implantable Lead Location: 753858
Implantable Lead Location: 753859
Implantable Lead Location: 753860
Implantable Lead Model: 7122
Lead Channel Impedance Value: 350 Ohm
Lead Channel Impedance Value: 550 Ohm
Lead Channel Impedance Value: 700 Ohm
Lead Channel Pacing Threshold Amplitude: 0.75 V
Lead Channel Pacing Threshold Amplitude: 0.75 V
Lead Channel Pacing Threshold Amplitude: 1 V
Lead Channel Pacing Threshold Pulse Width: 0.5 ms
Lead Channel Pacing Threshold Pulse Width: 0.5 ms
Lead Channel Pacing Threshold Pulse Width: 0.5 ms
Lead Channel Sensing Intrinsic Amplitude: 12 mV
Lead Channel Sensing Intrinsic Amplitude: 2.4 mV
Lead Channel Setting Pacing Amplitude: 2 V
Lead Channel Setting Pacing Amplitude: 2 V
Lead Channel Setting Pacing Amplitude: 2.5 V
Lead Channel Setting Pacing Pulse Width: 0.5 ms
Lead Channel Setting Pacing Pulse Width: 0.5 ms
Lead Channel Setting Sensing Sensitivity: 0.5 mV
Pulse Gen Serial Number: 7247188

## 2015-10-17 ENCOUNTER — Other Ambulatory Visit: Payer: Self-pay | Admitting: Orthopedic Surgery

## 2015-10-19 ENCOUNTER — Other Ambulatory Visit: Payer: Self-pay | Admitting: Internal Medicine

## 2015-10-22 ENCOUNTER — Encounter (HOSPITAL_COMMUNITY)
Admission: RE | Admit: 2015-10-22 | Discharge: 2015-10-22 | Disposition: A | Discharge status: Home / Self Care | Payer: Medicare PPO | Source: Ambulatory Visit | Attending: Orthopedic Surgery | Admitting: Orthopedic Surgery

## 2015-10-22 ENCOUNTER — Encounter (HOSPITAL_COMMUNITY): Payer: Self-pay

## 2015-10-22 DIAGNOSIS — I110 Hypertensive heart disease with heart failure: Secondary | ICD-10-CM | POA: Diagnosis not present

## 2015-10-22 DIAGNOSIS — Z794 Long term (current) use of insulin: Secondary | ICD-10-CM | POA: Diagnosis not present

## 2015-10-22 DIAGNOSIS — E7800 Pure hypercholesterolemia, unspecified: Secondary | ICD-10-CM | POA: Diagnosis not present

## 2015-10-22 DIAGNOSIS — K219 Gastro-esophageal reflux disease without esophagitis: Secondary | ICD-10-CM | POA: Diagnosis not present

## 2015-10-22 DIAGNOSIS — I428 Other cardiomyopathies: Secondary | ICD-10-CM | POA: Diagnosis not present

## 2015-10-22 DIAGNOSIS — Z905 Acquired absence of kidney: Secondary | ICD-10-CM | POA: Diagnosis not present

## 2015-10-22 DIAGNOSIS — C44629 Squamous cell carcinoma of skin of left upper limb, including shoulder: Secondary | ICD-10-CM | POA: Diagnosis not present

## 2015-10-22 DIAGNOSIS — E785 Hyperlipidemia, unspecified: Secondary | ICD-10-CM | POA: Diagnosis not present

## 2015-10-22 DIAGNOSIS — E032 Hypothyroidism due to medicaments and other exogenous substances: Secondary | ICD-10-CM | POA: Diagnosis not present

## 2015-10-22 DIAGNOSIS — F17210 Nicotine dependence, cigarettes, uncomplicated: Secondary | ICD-10-CM | POA: Diagnosis not present

## 2015-10-22 DIAGNOSIS — Z79899 Other long term (current) drug therapy: Secondary | ICD-10-CM | POA: Diagnosis not present

## 2015-10-22 DIAGNOSIS — Z95810 Presence of automatic (implantable) cardiac defibrillator: Secondary | ICD-10-CM | POA: Diagnosis not present

## 2015-10-22 DIAGNOSIS — E119 Type 2 diabetes mellitus without complications: Secondary | ICD-10-CM | POA: Diagnosis not present

## 2015-10-22 DIAGNOSIS — Z7982 Long term (current) use of aspirin: Secondary | ICD-10-CM | POA: Diagnosis not present

## 2015-10-22 DIAGNOSIS — R2232 Localized swelling, mass and lump, left upper limb: Secondary | ICD-10-CM | POA: Diagnosis present

## 2015-10-22 DIAGNOSIS — F329 Major depressive disorder, single episode, unspecified: Secondary | ICD-10-CM | POA: Diagnosis not present

## 2015-10-22 DIAGNOSIS — I509 Heart failure, unspecified: Secondary | ICD-10-CM | POA: Diagnosis not present

## 2015-10-22 DIAGNOSIS — Z7951 Long term (current) use of inhaled steroids: Secondary | ICD-10-CM | POA: Diagnosis not present

## 2015-10-22 DIAGNOSIS — F419 Anxiety disorder, unspecified: Secondary | ICD-10-CM | POA: Diagnosis not present

## 2015-10-22 HISTORY — DX: Unspecified osteoarthritis, unspecified site: M19.90

## 2015-10-22 HISTORY — DX: Other specified postprocedural states: Z98.890

## 2015-10-22 HISTORY — DX: Presence of automatic (implantable) cardiac defibrillator: Z95.810

## 2015-10-22 HISTORY — DX: Nausea with vomiting, unspecified: R11.2

## 2015-10-22 LAB — CBC
HEMATOCRIT: 39 % (ref 36.0–46.0)
HEMOGLOBIN: 12.3 g/dL (ref 12.0–15.0)
MCH: 29.1 pg (ref 26.0–34.0)
MCHC: 31.5 g/dL (ref 30.0–36.0)
MCV: 92.2 fL (ref 78.0–100.0)
Platelets: 236 10*3/uL (ref 150–400)
RBC: 4.23 MIL/uL (ref 3.87–5.11)
RDW: 13.8 % (ref 11.5–15.5)
WBC: 9.6 10*3/uL (ref 4.0–10.5)

## 2015-10-22 LAB — BASIC METABOLIC PANEL
Anion gap: 6 (ref 5–15)
BUN: 11 mg/dL (ref 6–20)
CALCIUM: 9.5 mg/dL (ref 8.9–10.3)
CO2: 28 mmol/L (ref 22–32)
Chloride: 105 mmol/L (ref 101–111)
Creatinine, Ser: 0.74 mg/dL (ref 0.44–1.00)
GFR calc non Af Amer: >60 mL/min (ref >60)
Glucose, Bld: 71 mg/dL (ref 65–99)
Potassium: 3.8 mmol/L (ref 3.5–5.1)
Sodium: 139 mmol/L (ref 135–145)

## 2015-10-22 LAB — GLUCOSE, CAPILLARY: GLUCOSE-CAPILLARY: 90 mg/dL (ref 65–99)

## 2015-10-22 NOTE — Pre-Procedure Instructions (Signed)
Carolyn [name]  10/22/2015      [id number] [address] [place] [address]Phone: [phone]: [phone] [facility] [address]: [phone]: [facility] [place]Phone: [phone]: [phone]   Your procedure is scheduled on Thursday, August 17th, 2017.  [facility]   Call this number if you have problems the morning of surgery:  [phone]   Remember:  Do not eat food or drink liquids after midnight.   Take these medicines the morning of surgery with A SIP OF WATER: Alprazolam (Xanax) if needed, Bupropion (Wellbutrin), Fluticasone (Flonase), Levothyroxine (Synthroid), Metoprolol Tartrate (Lopressor), Omeprazole (Prilosec), Ondansetron (Zofran) if needed, Tizanidine (Zanaflex) if needed, Tramadol (Ultram) if needed.   WHAT DO I DO ABOUT MY DIABETES MEDICATION?  .Marland KitchenDo not take oral diabetes medicines (pills) the morning of surgery.  Do NOT take Januvia or Pioglitazone (Actos) the morning of surgery.    . THE Morning of SURGERY, take 18 units of Lantus insulin.      . The day of surgery, do not take other diabetes injectables, including Byetta (exenatide), Bydureon (exenatide ER), Victoza (liraglutide), or Trulicity (dulaglutide).   Stop taking: Aspirin, NSAIDS, Aleve, Naproxen, Ibuprofen, Advil, Motrin, BC's, Goody's, Fish oil, all herbal medications, and all vitamins.    Do not wear jewelry, make-up or nail polish.  Do not wear lotions, powders, or perfumes.  You may NOT wear deoderant.  Do not shave 48 hours prior to surgery.    Do not bring valuables to the hospital.  [facility] belongings or  [facility].  Contacts, dentures or bridgework may not be worn into surgery.  Leave your suitcase in the car.  After surgery it may be brought to your room.  For patients admitted to the hospital, discharge time will be determined by your treatment team.  Patients discharged the day of surgery will not be allowed to drive home.   Special instructions:  Preparing for Surgery  Please read over the following fact sheets that you were given.   How to Manage Your Diabetes Before and After Surgery  Why is it important to control my blood sugar before and after surgery? . Improving blood sugar levels before and after surgery helps healing and can limit problems. . A way of improving blood sugar control is eating a healthy diet by: o  Eating less sugar and carbohydrates o  Increasing activity/exercise o  Talking with your doctor about reaching your blood sugar goals . High blood sugars (greater than 180 mg/dL) can raise your risk of infections and slow your recovery, so you will need to focus on controlling your diabetes during the weeks before surgery. . Make sure that the doctor who takes care of your diabetes knows about your planned surgery including the date and location.  How do I manage my blood sugar before surgery? . Check your blood sugar at least 4 times a day, starting 2 days before surgery, to make sure that the level is not too high or low. o Check your blood sugar the morning of your surgery when you wake up and every 2 hours until you get  to the Short Stay unit. . If your blood sugar is less than 70 mg/dL, you will need to treat for low blood sugar: o Do not take insulin. o Treat a low blood sugar (less than 70 mg/dL) with  cup of clear juice (cranberry or apple), 4 glucose tablets, OR glucose gel. o Recheck blood sugar in 15 minutes after treatment (to make sure it is greater than 70 mg/dL). If your blood sugar is not greater than 70 mg/dL on recheck, call [phone] for  further instructions. . Report your blood sugar to the short stay nurse when you get to Short Stay.  . If you are admitted to the hospital after surgery: o Your blood sugar will be checked by the staff and you will probably be given insulin after surgery (instead of oral diabetes medicines) to make sure you have good blood sugar levels. o The goal for blood sugar control after surgery is 80-180 mg/dL.    [facility]- Preparing For Surgery  Before surgery, you can play an important role. Because skin is not sterile, your skin needs to be as free of germs as possible. You can reduce the number of germs on your skin by washing with CHG (chlorahexidine gluconate) Soap before surgery.  CHG is an antiseptic cleaner which kills germs and bonds with the skin to continue killing germs even after washing.  Please do not use if you have an allergy to CHG or antibacterial soaps. If your skin becomes reddened/irritated stop using the CHG.  Do not shave (including legs and underarms) for at least 48 hours prior to first CHG shower. It is OK to shave your face.  Please follow these instructions carefully.   1. Shower the NIGHT BEFORE SURGERY and the MORNING OF SURGERY with CHG.   2. If you chose to wash your hair, wash your hair first as usual with your normal shampoo.  3. After you shampoo, rinse your hair and body thoroughly to remove the shampoo.  4. Use CHG as you would any other liquid soap. You can apply CHG directly to the skin and wash gently with a scrungie or a clean washcloth.   5. Apply the CHG Soap to your body ONLY FROM THE NECK DOWN.  Do not use on open wounds or open sores. Avoid contact with your eyes, ears, mouth and genitals (private parts). Wash genitals (private parts) with your normal soap.  6. Wash thoroughly, paying special attention to the area where your surgery will be performed.  7. Thoroughly rinse your body with warm water from the neck down.  8. DO NOT shower/wash with  your normal soap after using and rinsing off the CHG Soap.  9. Pat yourself dry with a CLEAN TOWEL.   10. Wear CLEAN PAJAMAS   11. Place CLEAN SHEETS on your bed the night of your first shower and DO NOT SLEEP WITH PETS.  Day of Surgery: Do not apply any deodorants/lotions. Please wear clean clothes to the hospital/surgery center.

## 2015-10-22 NOTE — Progress Notes (Addendum)
Paged [employer] rep to notify of surgery date and time.  Hannah from [place] returned nurses call to verify surgery date and time.

## 2015-10-23 MED ORDER — VANCOMYCIN HCL IN DEXTROSE 1-5 GM/200ML-% IV SOLN
1000.0000 mg | INTRAVENOUS | Status: AC
  Administered 2015-10-24: 1000 mg via INTRAVENOUS
  Filled 2015-10-23: qty 200

## 2015-10-23 NOTE — Progress Notes (Signed)
Anesthesia Chart Review: Patient is a 75 year old female scheduled for   History includes non-ischemic cardiomyopathy (EF 30% '09, 55% '16), chronic systolic CHF, [employer] Jude BiV ICD 12/06/08 (generator change 02/21/15), DM2, HLD, HTN,  hypothyroidism, left BBB, GERD, smoking, depression, anxiety, post-operative N/V, IBS, appendectomy '61, left nephrectomy '73, hysterectomy '86, partial thyroidectomy. BMI is consistent with obesity.   PCP is Dr. Cathlean Cower, last visti 09/24/15.  EP Cardiologist is Dr. Rayann Heman, last visit 07/05/15. One year follow-up recommended. She had normal device function by  10/04/15 remote transmission. Primary cardiologist is Dr. Angelena Form, last visit 03/28/15. Continue current therapy recommended.   Meds include Xanax, ASA 81 mg, Lipitor, Wellbutrin XL, Flonase, Lasix, Lantus, Januvia, levothyroxine, lisinopril, Lopressor, Prilosec, Actos, Zanaflex, tramadol.  07/05/15 EKG: V-paced rhythm.  02/26/15 Echo: Study Conclusions - Left ventricle: The cavity size was normal. Wall thickness was increased in a pattern of mild LVH. Systolic function was normal. The estimated ejection fraction was in the range of 55% to 60%. Incoordinate septal motion. Doppler parameters are consistent with abnormal left ventricular relaxation (grade 1 diastolic dysfunction). The E/e&' ratio is between 8-15, suggesting indeterminate LV filling pressure. - Left atrium: The atrium was normal in size. - Right ventricle: The cavity size was normal. Wall thickness was normal. AICD wire noted in right ventricle. Systolic function was normal. - Right atrium: The atrium was normal in size. AICD wire noted in right atrium. - Tricuspid valve: There was mild regurgitation. - Pulmonary arteries: PA peak pressure: 44 mm Hg (S). - Inferior vena cava: The vessel was normal in size. The respirophasic diameter changes were in the normal range (= 50%), consistent with normal central venous pressure. Impressions: - Compared to  the prior echo in 2013 - the EF has now normalized to 55-60%. There is mild pulmonary hypertension with RVSP of 44 mmHg.  01/10/08 Cardiac cath:  IMPRESSION:  1. Nonischemic cardiomyopathy.  2. No angiographic evidence of coronary artery disease.  3. Elevated left ventricular filling pressures.  4. No assessment of left ventricular function secondary to elevated end-diastolic pressure.  08/01/10 Carotid U/S: Mild hard plaque in the proximal ICA's. 0-39% BICA stenosis.  12/04/14 CXR: IMPRESSION: No active cardiopulmonary disease.  Preoperative labs noted.  If no acute changes then I anticipate that she can proceed as planned.  George Hugh [facility] Short Stay Center/Anesthesiology Phone [phone] 10/23/2015 9:10 AM

## 2015-10-24 ENCOUNTER — Encounter (HOSPITAL_COMMUNITY)
Admission: RE | Disposition: A | Discharge status: Home / Self Care | Payer: Self-pay | Source: Ambulatory Visit | Attending: Orthopedic Surgery

## 2015-10-24 ENCOUNTER — Ambulatory Visit (HOSPITAL_COMMUNITY): Payer: Medicare PPO | Admitting: Vascular Surgery

## 2015-10-24 ENCOUNTER — Ambulatory Visit (HOSPITAL_COMMUNITY): Payer: Medicare PPO | Admitting: Certified Registered"

## 2015-10-24 ENCOUNTER — Encounter (HOSPITAL_COMMUNITY): Payer: Self-pay | Admitting: *Deleted

## 2015-10-24 ENCOUNTER — Ambulatory Visit (HOSPITAL_COMMUNITY)
Admission: RE | Admit: 2015-10-24 | Discharge: 2015-10-24 | Disposition: A | Discharge status: Home / Self Care | Payer: Medicare PPO | Source: Ambulatory Visit | Attending: Orthopedic Surgery | Admitting: Orthopedic Surgery

## 2015-10-24 DIAGNOSIS — I428 Other cardiomyopathies: Secondary | ICD-10-CM | POA: Insufficient documentation

## 2015-10-24 DIAGNOSIS — E032 Hypothyroidism due to medicaments and other exogenous substances: Secondary | ICD-10-CM | POA: Insufficient documentation

## 2015-10-24 DIAGNOSIS — E785 Hyperlipidemia, unspecified: Secondary | ICD-10-CM | POA: Insufficient documentation

## 2015-10-24 DIAGNOSIS — F17210 Nicotine dependence, cigarettes, uncomplicated: Secondary | ICD-10-CM | POA: Insufficient documentation

## 2015-10-24 DIAGNOSIS — C44629 Squamous cell carcinoma of skin of left upper limb, including shoulder: Secondary | ICD-10-CM | POA: Diagnosis not present

## 2015-10-24 DIAGNOSIS — E7800 Pure hypercholesterolemia, unspecified: Secondary | ICD-10-CM | POA: Insufficient documentation

## 2015-10-24 DIAGNOSIS — Z7982 Long term (current) use of aspirin: Secondary | ICD-10-CM | POA: Insufficient documentation

## 2015-10-24 DIAGNOSIS — L989 Disorder of the skin and subcutaneous tissue, unspecified: Secondary | ICD-10-CM | POA: Diagnosis not present

## 2015-10-24 DIAGNOSIS — F329 Major depressive disorder, single episode, unspecified: Secondary | ICD-10-CM | POA: Insufficient documentation

## 2015-10-24 DIAGNOSIS — Z905 Acquired absence of kidney: Secondary | ICD-10-CM | POA: Insufficient documentation

## 2015-10-24 DIAGNOSIS — I509 Heart failure, unspecified: Secondary | ICD-10-CM | POA: Diagnosis not present

## 2015-10-24 DIAGNOSIS — F419 Anxiety disorder, unspecified: Secondary | ICD-10-CM | POA: Diagnosis not present

## 2015-10-24 DIAGNOSIS — E119 Type 2 diabetes mellitus without complications: Secondary | ICD-10-CM | POA: Diagnosis not present

## 2015-10-24 DIAGNOSIS — Z79899 Other long term (current) drug therapy: Secondary | ICD-10-CM | POA: Insufficient documentation

## 2015-10-24 DIAGNOSIS — K219 Gastro-esophageal reflux disease without esophagitis: Secondary | ICD-10-CM | POA: Diagnosis not present

## 2015-10-24 DIAGNOSIS — I110 Hypertensive heart disease with heart failure: Secondary | ICD-10-CM | POA: Diagnosis not present

## 2015-10-24 DIAGNOSIS — Z7951 Long term (current) use of inhaled steroids: Secondary | ICD-10-CM | POA: Insufficient documentation

## 2015-10-24 DIAGNOSIS — R2232 Localized swelling, mass and lump, left upper limb: Secondary | ICD-10-CM | POA: Diagnosis not present

## 2015-10-24 DIAGNOSIS — Z794 Long term (current) use of insulin: Secondary | ICD-10-CM | POA: Insufficient documentation

## 2015-10-24 DIAGNOSIS — Z95810 Presence of automatic (implantable) cardiac defibrillator: Secondary | ICD-10-CM | POA: Insufficient documentation

## 2015-10-24 HISTORY — PX: MASS EXCISION: SHX2000

## 2015-10-24 LAB — GLUCOSE, CAPILLARY
Glucose-Capillary: 121 mg/dL — ABNORMAL HIGH (ref 65–99)
Glucose-Capillary: 160 mg/dL — ABNORMAL HIGH (ref 65–99)

## 2015-10-24 SURGERY — EXCISION MASS
Anesthesia: General | Site: Hand | Laterality: Left

## 2015-10-24 MED ORDER — PROPOFOL 10 MG/ML IV BOLUS
INTRAVENOUS | Status: AC
  Filled 2015-10-24: qty 20

## 2015-10-24 MED ORDER — FENTANYL CITRATE (PF) 100 MCG/2ML IJ SOLN
25.0000 ug | INTRAMUSCULAR | Status: DC | PRN
  Administered 2015-10-24 (×2): 25 ug via INTRAVENOUS

## 2015-10-24 MED ORDER — FENTANYL CITRATE (PF) 100 MCG/2ML IJ SOLN
INTRAMUSCULAR | Status: DC | PRN
  Administered 2015-10-24: 25 ug via INTRAVENOUS
  Administered 2015-10-24: 50 ug via INTRAVENOUS
  Administered 2015-10-24: 25 ug via INTRAVENOUS

## 2015-10-24 MED ORDER — ONDANSETRON HCL 4 MG/2ML IJ SOLN
INTRAMUSCULAR | Status: DC | PRN
  Administered 2015-10-24: 4 mg via INTRAVENOUS

## 2015-10-24 MED ORDER — FENTANYL CITRATE (PF) 100 MCG/2ML IJ SOLN
INTRAMUSCULAR | Status: AC
  Filled 2015-10-24: qty 2

## 2015-10-24 MED ORDER — MIDAZOLAM HCL 5 MG/5ML IJ SOLN
INTRAMUSCULAR | Status: DC | PRN
  Administered 2015-10-24: 2 mg via INTRAVENOUS

## 2015-10-24 MED ORDER — DEXAMETHASONE SODIUM PHOSPHATE 10 MG/ML IJ SOLN
INTRAMUSCULAR | Status: AC
  Filled 2015-10-24: qty 1

## 2015-10-24 MED ORDER — PHENYLEPHRINE 40 MCG/ML (10ML) SYRINGE FOR IV PUSH (FOR BLOOD PRESSURE SUPPORT)
PREFILLED_SYRINGE | INTRAVENOUS | Status: AC
  Filled 2015-10-24: qty 10

## 2015-10-24 MED ORDER — CHLORHEXIDINE GLUCONATE 4 % EX LIQD: 60.0000 mL | Freq: Once | CUTANEOUS | Status: DC

## 2015-10-24 MED ORDER — DEXAMETHASONE SODIUM PHOSPHATE 10 MG/ML IJ SOLN
INTRAMUSCULAR | Status: DC | PRN
  Administered 2015-10-24: 5 mg via INTRAVENOUS

## 2015-10-24 MED ORDER — LIDOCAINE HCL (CARDIAC) 20 MG/ML IV SOLN
INTRAVENOUS | Status: DC | PRN
Start: 2015-10-24 — End: 2015-10-24
  Administered 2015-10-24: 60 mg via INTRAVENOUS

## 2015-10-24 MED ORDER — HYDROCODONE-ACETAMINOPHEN 10-325 MG PO TABS: 1.0000 | ORAL_TABLET | Freq: Four times a day (QID) | ORAL | 0 refills | Status: DC | PRN

## 2015-10-24 MED ORDER — SODIUM CHLORIDE 0.9 % IR SOLN
Status: DC | PRN
  Administered 2015-10-24: 1000 mL

## 2015-10-24 MED ORDER — BUPIVACAINE HCL (PF) 0.25 % IJ SOLN
INTRAMUSCULAR | Status: DC | PRN
  Administered 2015-10-24: 8 mL

## 2015-10-24 MED ORDER — CEFAZOLIN SODIUM 1 G IJ SOLR
INTRAMUSCULAR | Status: DC
Start: 2015-10-24 — End: 2015-10-24
  Filled 2015-10-24: qty 1

## 2015-10-24 MED ORDER — FENTANYL CITRATE (PF) 100 MCG/2ML IJ SOLN
INTRAMUSCULAR | Status: AC
  Administered 2015-10-24: 25 ug via INTRAVENOUS
  Filled 2015-10-24: qty 2

## 2015-10-24 MED ORDER — LACTATED RINGERS IV SOLN
INTRAVENOUS | Status: DC
  Administered 2015-10-24: 08:00:00 via INTRAVENOUS

## 2015-10-24 MED ORDER — PROPOFOL 10 MG/ML IV BOLUS
INTRAVENOUS | Status: DC | PRN
  Administered 2015-10-24: 140 mg via INTRAVENOUS

## 2015-10-24 MED ORDER — ONDANSETRON HCL 4 MG/2ML IJ SOLN: 4.0000 mg | Freq: Once | INTRAMUSCULAR | Status: DC | PRN

## 2015-10-24 MED ORDER — ONDANSETRON HCL 4 MG/2ML IJ SOLN
INTRAMUSCULAR | Status: AC
  Filled 2015-10-24: qty 2

## 2015-10-24 MED ORDER — BUPIVACAINE HCL (PF) 0.25 % IJ SOLN
INTRAMUSCULAR | Status: AC
  Filled 2015-10-24: qty 30

## 2015-10-24 MED ORDER — LIDOCAINE 2% (20 MG/ML) 5 ML SYRINGE
INTRAMUSCULAR | Status: AC
  Filled 2015-10-24: qty 5

## 2015-10-24 MED ORDER — PHENYLEPHRINE HCL 10 MG/ML IJ SOLN
INTRAMUSCULAR | Status: DC | PRN
  Administered 2015-10-24: 40 ug via INTRAVENOUS
  Administered 2015-10-24 (×4): 80 ug via INTRAVENOUS

## 2015-10-24 MED ORDER — PROPOFOL 1000 MG/100ML IV EMUL
INTRAVENOUS | Status: AC
  Filled 2015-10-24: qty 200

## 2015-10-24 MED ORDER — MIDAZOLAM HCL 2 MG/2ML IJ SOLN
INTRAMUSCULAR | Status: AC
  Filled 2015-10-24: qty 2

## 2015-10-24 SURGICAL SUPPLY — 32 items
BANDAGE COBAN STERILE 2 (GAUZE/BANDAGES/DRESSINGS) ×3 IMPLANT
BANDAGE ELASTIC 4 VELCRO ST LF (GAUZE/BANDAGES/DRESSINGS) ×3 IMPLANT
BNDG ESMARK 4X9 LF (GAUZE/BANDAGES/DRESSINGS) ×3 IMPLANT
BNDG GAUZE ELAST 4 BULKY (GAUZE/BANDAGES/DRESSINGS) ×3 IMPLANT
CORDS BIPOLAR (ELECTRODE) ×3 IMPLANT
CUFF TOURNIQUET SINGLE 18IN (TOURNIQUET CUFF) ×3 IMPLANT
CUFF TOURNIQUET SINGLE 24IN (TOURNIQUET CUFF) IMPLANT
DRSG ADAPTIC 3X8 NADH LF (GAUZE/BANDAGES/DRESSINGS) ×3 IMPLANT
DRSG EMULSION OIL 3X3 NADH (GAUZE/BANDAGES/DRESSINGS) ×3 IMPLANT
DRSG PAD ABDOMINAL 8X10 ST (GAUZE/BANDAGES/DRESSINGS) ×6 IMPLANT
GAUZE SPONGE 4X4 12PLY STRL (GAUZE/BANDAGES/DRESSINGS) ×3 IMPLANT
GAUZE XEROFORM 1X8 LF (GAUZE/BANDAGES/DRESSINGS) ×3 IMPLANT
GAUZE XEROFORM 5X9 LF (GAUZE/BANDAGES/DRESSINGS) ×3 IMPLANT
HANDPIECE INTERPULSE COAX TIP (DISPOSABLE)
KIT BASIN OR (CUSTOM PROCEDURE TRAY) ×3 IMPLANT
KIT ROOM TURNOVER OR (KITS) ×3 IMPLANT
MANIFOLD NEPTUNE II (INSTRUMENTS) ×3 IMPLANT
NS IRRIG 1000ML POUR BTL (IV SOLUTION) ×3 IMPLANT
PACK ORTHO EXTREMITY (CUSTOM PROCEDURE TRAY) ×3 IMPLANT
PAD ARMBOARD 7.5X6 YLW CONV (MISCELLANEOUS) ×6 IMPLANT
SET HNDPC FAN SPRY TIP SCT (DISPOSABLE) IMPLANT
SPONGE GAUZE 4X4 12PLY STER LF (GAUZE/BANDAGES/DRESSINGS) ×3 IMPLANT
SPONGE LAP 18X18 X RAY DECT (DISPOSABLE) ×3 IMPLANT
SPONGE LAP 4X18 X RAY DECT (DISPOSABLE) ×3 IMPLANT
TOWEL OR 17X24 6PK STRL BLUE (TOWEL DISPOSABLE) ×3 IMPLANT
TOWEL OR 17X26 10 PK STRL BLUE (TOWEL DISPOSABLE) ×3 IMPLANT
TUBE ANAEROBIC SPECIMEN COL (MISCELLANEOUS) IMPLANT
TUBE CONNECTING 12'X1/4 (SUCTIONS) ×1
TUBE CONNECTING 12X1/4 (SUCTIONS) ×2 IMPLANT
UNDERPAD 30X30 INCONTINENT (UNDERPADS AND DIAPERS) ×3 IMPLANT
WATER STERILE IRR 1000ML POUR (IV SOLUTION) ×3 IMPLANT
YANKAUER SUCT BULB TIP NO VENT (SUCTIONS) ×3 IMPLANT

## 2015-10-24 NOTE — Anesthesia Preprocedure Evaluation (Addendum)
Anesthesia Evaluation  Patient identified by MRN, date of birth, ID band Patient awake    Reviewed: Allergy & Precautions, NPO status , Patient's Chart, lab work & pertinent test results  Airway Mallampati: II  TM Distance: >3 FB Neck ROM: Full    Dental  (+) Teeth Intact, Dental Advisory Given   Pulmonary Current Smoker,    breath sounds clear to auscultation       Cardiovascular hypertension,  Rhythm:Regular Rate:Normal     Neuro/Psych    GI/Hepatic   Endo/Other  diabetes  Renal/GU      Musculoskeletal   Abdominal   Peds  Hematology   Anesthesia Other Findings   Reproductive/Obstetrics                             Anesthesia Physical Anesthesia Plan  ASA: III  Anesthesia Plan: General   Post-op Pain Management:    Induction: Intravenous  Airway Management Planned: LMA  Additional Equipment:   Intra-op Plan:   Post-operative Plan: Extubation in OR  Informed Consent: I have reviewed the patients History and Physical, chart, labs and discussed the procedure including the risks, benefits and alternatives for the proposed anesthesia with the patient or authorized representative who has indicated his/her understanding and acceptance.   Dental advisory given  Plan Discussed with: CRNA and Anesthesiologist  Anesthesia Plan Comments:         Anesthesia Quick Evaluation

## 2015-10-24 NOTE — Progress Notes (Signed)
Orthopedic Tech Progress Note Patient Details:  Carolyn [name] Apr 24, 1940 [id number]  Ortho Devices Type of Ortho Device: Arm sling Ortho Device/Splint Interventions: Application   [place] Pink 10/24/2015, 12:12 PM

## 2015-10-24 NOTE — Transfer of Care (Signed)
Immediate Anesthesia Transfer of Care Note  Patient: Carolyn [name]  Procedure(s) Performed: Procedure(s) with comments: EXCISION MASS left hand (Left) - FAB  Patient Location: PACU  Anesthesia Type:General  Level of Consciousness:  sedated, patient cooperative and responds to stimulation  Airway & Oxygen Therapy:Patient Spontanous Breathing and Patient connected to face mask oxgen  Post-op Assessment:  Report given to PACU RN and Post -op Vital signs reviewed and stable  Post vital signs:  Reviewed and stable  Last Vitals:  Vitals:   10/24/15 0654  BP: (!) 144/59  Pulse: 76  Resp: 20  Temp: 00.3 C    Complications: No apparent anesthesia complications

## 2015-10-24 NOTE — Anesthesia Postprocedure Evaluation (Signed)
Anesthesia Post Note  Patient: Carolyn [name]  Procedure(s) Performed: Procedure(s) (LRB): EXCISION MASS left hand (Left)  Patient location during evaluation: PACU Anesthesia Type: General Level of consciousness: awake, awake and alert and oriented Pain management: pain level controlled Vital Signs Assessment: post-procedure vital signs reviewed and stable Respiratory status: spontaneous breathing, nonlabored ventilation and respiratory function stable Anesthetic complications: no    Last Vitals:  Vitals:   10/24/15 1100 10/24/15 1119  BP:  (!) 135/91  Pulse: 67 63  Resp: 16 18  Temp: 36.7 C     Last Pain:  Vitals:   10/24/15 1045  TempSrc:   PainSc: 8                  Kele Withem COKER

## 2015-10-24 NOTE — Anesthesia Procedure Notes (Signed)
Procedure Name: LMA Insertion Date/Time: 10/24/2015 8:46 AM Performed by: Freddie Breech Pre-anesthesia Checklist: Patient identified, Emergency Drugs available, Suction available and Patient being monitored Patient Re-evaluated:Patient Re-evaluated prior to inductionOxygen Delivery Method: Circle System Utilized Preoxygenation: Pre-oxygenation with 100% oxygen Intubation Type: IV induction LMA: LMA inserted LMA Size: 4.0 Number of attempts: 1 Airway Equipment and Method: Bite block Placement Confirmation: positive ETCO2 and breath sounds checked- equal and bilateral Tube secured with: Tape Dental Injury: Teeth and Oropharynx as per pre-operative assessment
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
## 2015-10-24 NOTE — Op Note (Signed)
Dictation Number [id number]

## 2015-10-24 NOTE — Brief Op Note (Signed)
10/24/2015  9:17 AM  PATIENT:  [name]  75 y.o. female  PRE-OPERATIVE DIAGNOSIS:  mass left hand  POST-OPERATIVE DIAGNOSIS:  mass left hand  PROCEDURE:  Procedure(s) with comments: EXCISION MASS left hand (Left) - FAB  SURGEON:  Surgeon(s) and Role:    * Daryll Brod, MD - Primary  PHYSICIAN ASSISTANT:   ASSISTANTS: none   ANESTHESIA:   General and local EBL:  Total I/O In: -  Out: 20 [Blood:20]  BLOOD ADMINISTERED:none  DRAINS: none   LOCAL MEDICATIONS USED:  BUPIVICAINE   SPECIMEN:  Excision  DISPOSITION OF SPECIMEN:  PATHOLOGY  COUNTS:  YES  TOURNIQUET:  * No tourniquets in log *  DICTATION: .Other Dictation: Dictation Number [id number]  PLAN OF CARE: Discharge to home after PACU  PATIENT DISPOSITION:  PACU - hemodynamically stable.

## 2015-10-24 NOTE — Op Note (Signed)
NAME:  Carolyn [name], Carolyn [name] NO.:  [id number]  MEDICAL RECORD NO.:  [id number]  LOCATION:  [facility]                         FACILITY:  [facility]  PHYSICIAN:  Daryll Brod, M.D.       DATE OF BIRTH:  February 04, 1941  DATE OF PROCEDURE:  10/24/2015 DATE OF DISCHARGE:  10/24/2015                              OPERATIVE REPORT   PREOPERATIVE DIAGNOSIS:  Lesion, mass, left hand.  POSTOPERATIVE DIAGNOSIS:  Lesion, mass, left hand.  OPERATION:  Excisional biopsy, lesion, left hand.  SURGEON:  Daryll Brod, M.D.  ANESTHESIA:  General with local infiltration.  ANESTHESIOLOGIST:  Glynda Jaeger, M.D.  PLACE OF SURGERY:  [facility].  HISTORY:  The patient is a 75 year old female with history of a mass on the dorsal aspect of her left hand.  She states that this had a rock fall out of it on several occasions.  She is desirous of having this excised.  She is aware that there was no guarantee with the surgery; the possibility of infection; recurrence of injury to arteries, nerves, tendons; incomplete relief of symptoms and dystrophy.  In the preoperative area, the patient is seen, the extremity marked by both the patient and surgeon and antibiotic given.  PROCEDURE IN DETAIL:  The patient was brought to the operating room, where a general anesthetic was carried out without difficulty under the direction of Dr. Linna Caprice.  She was prepped using ChloraPrep in supine position with the left arm free.  A 3-minute dry time was allowed, time- out taken, confirming the patient and procedure.  The limb was exsanguinated with an Esmarch bandage, placed high on the arm and inflated to 250 mmHg.  The exsanguination was from the wrist proximally. An elliptical incision was then made through the skin excising the entire mass, taking care to protect the neurovascular structures beneath this.  This proceeded down to the paratenon of the extensor tendons. This was over the third metacarpal.  The  lesion measured approximately 1.5 cm in diameter.  This was marked distally.  Bleeders were electrocauterized with bipolar.  The wound was then closed with interrupted 4-0 nylon sutures.  This allowed complete closure of the wound.  The specimen was then tagged distally with the 4-0 nylon sutures.  The specimen was sent to Pathology.  A sterile compressive dressing was applied. Fingers were left free.  On deflation of the tourniquet, all fingers were immediately pinked.  She was taken to the recovery room for observation in satisfactory condition.  She will be discharged to home to return to the [facility] in 1 week, on [place].          ______________________________ Daryll Brod, M.D.     GK/MEDQ  D:  10/24/2015  T:  10/24/2015  Job:  [id number]

## 2015-10-25 ENCOUNTER — Encounter (HOSPITAL_COMMUNITY): Payer: Self-pay | Admitting: Orthopedic Surgery

## 2015-11-04 ENCOUNTER — Other Ambulatory Visit: Payer: Self-pay | Admitting: *Deleted

## 2015-11-04 MED ORDER — METOPROLOL TARTRATE 25 MG PO TABS: ORAL_TABLET | ORAL | 1 refills | Status: DC

## 2015-11-04 MED ORDER — INSULIN GLARGINE 100 UNIT/ML SOLOSTAR PEN: 37.0000 [IU] | PEN_INJECTOR | Freq: Every day | SUBCUTANEOUS | 3 refills | Status: DC

## 2015-11-04 MED ORDER — LISINOPRIL 20 MG PO TABS: ORAL_TABLET | ORAL | 1 refills | Status: DC

## 2015-11-22 ENCOUNTER — Other Ambulatory Visit: Payer: Self-pay | Admitting: Internal Medicine

## 2015-12-06 ENCOUNTER — Ambulatory Visit (INDEPENDENT_AMBULATORY_CARE_PROVIDER_SITE_OTHER): Payer: Medicare PPO | Admitting: Cardiovascular Disease

## 2015-12-06 VITALS — BP 132/84 | HR 76 | Ht 60.0 in | Wt 168.2 lb

## 2015-12-06 DIAGNOSIS — I10 Essential (primary) hypertension: Secondary | ICD-10-CM | POA: Diagnosis not present

## 2015-12-06 DIAGNOSIS — I428 Other cardiomyopathies: Secondary | ICD-10-CM

## 2015-12-06 DIAGNOSIS — F17200 Nicotine dependence, unspecified, uncomplicated: Secondary | ICD-10-CM | POA: Diagnosis not present

## 2015-12-06 DIAGNOSIS — I519 Heart disease, unspecified: Secondary | ICD-10-CM | POA: Diagnosis not present

## 2015-12-06 DIAGNOSIS — I429 Cardiomyopathy, unspecified: Secondary | ICD-10-CM | POA: Diagnosis not present
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
## 2015-12-06 NOTE — Patient Instructions (Signed)

## 2015-12-08 ENCOUNTER — Other Ambulatory Visit: Payer: Self-pay | Admitting: Internal Medicine

## 2015-12-10 MED ORDER — TRAMADOL HCL 50 MG PO TABS: 50.0000 mg | ORAL_TABLET | Freq: Three times a day (TID) | ORAL | 2 refills | Status: DC | PRN

## 2015-12-10 NOTE — Telephone Encounter (Signed)
Done hardcopy to Corinne  

## 2015-12-10 NOTE — Telephone Encounter (Signed)
Faxed script back to [facility].../lmb 

## 2015-12-13 DIAGNOSIS — M546 Pain in thoracic spine: Secondary | ICD-10-CM | POA: Diagnosis not present

## 2015-12-13 DIAGNOSIS — M542 Cervicalgia: Secondary | ICD-10-CM | POA: Diagnosis not present

## 2015-12-25 ENCOUNTER — Encounter (INDEPENDENT_AMBULATORY_CARE_PROVIDER_SITE_OTHER): Payer: Medicare PPO | Admitting: Physical Medicine and Rehabilitation

## 2015-12-25 DIAGNOSIS — M47816 Spondylosis without myelopathy or radiculopathy, lumbar region: Secondary | ICD-10-CM | POA: Diagnosis not present

## 2015-12-27 DIAGNOSIS — L821 Other seborrheic keratosis: Secondary | ICD-10-CM | POA: Diagnosis not present

## 2015-12-27 DIAGNOSIS — L814 Other melanin hyperpigmentation: Secondary | ICD-10-CM | POA: Diagnosis not present

## 2015-12-27 DIAGNOSIS — L570 Actinic keratosis: Secondary | ICD-10-CM | POA: Diagnosis not present

## 2015-12-27 DIAGNOSIS — Z85828 Personal history of other malignant neoplasm of skin: Secondary | ICD-10-CM | POA: Diagnosis not present

## 2015-12-27 DIAGNOSIS — D485 Neoplasm of uncertain behavior of skin: Secondary | ICD-10-CM | POA: Diagnosis not present

## 2015-12-27 DIAGNOSIS — L859 Epidermal thickening, unspecified: Secondary | ICD-10-CM | POA: Diagnosis not present

## 2015-12-31 ENCOUNTER — Other Ambulatory Visit: Payer: Self-pay | Admitting: Internal Medicine

## 2015-12-31 NOTE — Telephone Encounter (Signed)
Done hardcopy to Corinne  

## 2015-12-31 NOTE — Telephone Encounter (Signed)
faxed

## 2016-01-07 ENCOUNTER — Ambulatory Visit (INDEPENDENT_AMBULATORY_CARE_PROVIDER_SITE_OTHER): Payer: Medicare PPO | Admitting: *Deleted

## 2016-01-07 DIAGNOSIS — I428 Other cardiomyopathies: Secondary | ICD-10-CM

## 2016-01-07 LAB — CUP PACEART REMOTE DEVICE CHECK
Battery Remaining Longevity: 72 mo
Battery Voltage: 3.02 V
Brady Statistic AP VS Percent: 1 %
Brady Statistic AS VS Percent: 1 %
Brady Statistic RA Percent Paced: 2 %
HIGH POWER IMPEDANCE MEASURED VALUE: 75 Ohm
HIGH POWER IMPEDANCE MEASURED VALUE: 75 Ohm
Implantable Lead Implant Date: 20100930
Implantable Lead Implant Date: 20100930
Implantable Lead Implant Date: 20100930
Implantable Lead Location: 753858
Implantable Lead Location: 753859
Implantable Pulse Generator Implant Date: 20161215
Lead Channel Impedance Value: 560 Ohm
Lead Channel Pacing Threshold Amplitude: 0.75 V
Lead Channel Pacing Threshold Amplitude: 1 V
Lead Channel Pacing Threshold Pulse Width: 0.5 ms
Lead Channel Sensing Intrinsic Amplitude: 11.8 mV
Lead Channel Setting Pacing Amplitude: 2 V
Lead Channel Setting Sensing Sensitivity: 0.5 mV
MDC IDC LEAD LOCATION: 753860
MDC IDC LEAD MODEL: 7122
MDC IDC MSMT BATTERY REMAINING PERCENTAGE: 84 %
MDC IDC MSMT LEADCHNL LV PACING THRESHOLD PULSEWIDTH: 0.5 ms
MDC IDC MSMT LEADCHNL RA IMPEDANCE VALUE: 360 Ohm
MDC IDC MSMT LEADCHNL RA PACING THRESHOLD AMPLITUDE: 0.75 V
MDC IDC MSMT LEADCHNL RA SENSING INTR AMPL: 2.6 mV
MDC IDC MSMT LEADCHNL RV IMPEDANCE VALUE: 730 Ohm
MDC IDC MSMT LEADCHNL RV PACING THRESHOLD PULSEWIDTH: 0.5 ms
MDC IDC SESS DTM: 20171031060033
MDC IDC SET LEADCHNL LV PACING PULSEWIDTH: 0.5 ms
MDC IDC SET LEADCHNL RA PACING AMPLITUDE: 2 V
MDC IDC SET LEADCHNL RV PACING AMPLITUDE: 2.5 V
MDC IDC SET LEADCHNL RV PACING PULSEWIDTH: 0.5 ms
MDC IDC STAT BRADY AP VP PERCENT: 2 %
MDC IDC STAT BRADY AS VP PERCENT: 98 %
Pulse Gen Serial Number: 7247188

## 2016-01-08 NOTE — Progress Notes (Signed)
Remote ICD transmission.   

## 2016-01-15 ENCOUNTER — Encounter: Payer: Self-pay | Admitting: Cardiology

## 2016-01-21 ENCOUNTER — Telehealth (INDEPENDENT_AMBULATORY_CARE_PROVIDER_SITE_OTHER): Payer: Self-pay | Admitting: Physical Medicine and Rehabilitation

## 2016-01-21 NOTE — Telephone Encounter (Signed)
I called the patient and left a message for her to call back to schedule an office visit.

## 2016-01-21 NOTE — Telephone Encounter (Signed)
If it helped ut was short lived then need to do RFA, can ov to discuss

## 2016-01-24 NOTE — Telephone Encounter (Signed)
I called the patient and left message #2 for her to call back for scheduling.

## 2016-02-03 ENCOUNTER — Telehealth: Payer: Self-pay | Admitting: Cardiology

## 2016-02-03 ENCOUNTER — Telehealth: Payer: Self-pay | Admitting: Internal Medicine

## 2016-02-03 NOTE — Telephone Encounter (Signed)
Spoke w/ pt and informed her that we would contact [facility] [employer] representative to order a cell adapter for the patient. Pt verbalized understanding.

## 2016-02-03 NOTE — Telephone Encounter (Signed)
Patient called back and said that the injection helped but only for a few weeks. I scheduled her for an office visit to discuss RFA 02/05/16 at 945.

## 2016-02-03 NOTE — Telephone Encounter (Signed)
Pt having trouble with her land line and her phone bill went up, she may cancel her service all together and wants to get more info on the phone app for her cell to see if her device is compatible --pls call cell

## 2016-02-03 NOTE — Telephone Encounter (Signed)
Open in error

## 2016-02-05 ENCOUNTER — Encounter (INDEPENDENT_AMBULATORY_CARE_PROVIDER_SITE_OTHER): Payer: Self-pay | Admitting: Physical Medicine and Rehabilitation

## 2016-02-05 ENCOUNTER — Ambulatory Visit (INDEPENDENT_AMBULATORY_CARE_PROVIDER_SITE_OTHER): Payer: Medicare PPO | Admitting: Physical Medicine and Rehabilitation

## 2016-02-05 ENCOUNTER — Encounter (INDEPENDENT_AMBULATORY_CARE_PROVIDER_SITE_OTHER): Payer: Self-pay

## 2016-02-05 VITALS — BP 149/78 | HR 62

## 2016-02-05 DIAGNOSIS — M545 Low back pain: Secondary | ICD-10-CM | POA: Diagnosis not present

## 2016-02-05 DIAGNOSIS — M47816 Spondylosis without myelopathy or radiculopathy, lumbar region: Secondary | ICD-10-CM | POA: Diagnosis not present

## 2016-02-05 DIAGNOSIS — G894 Chronic pain syndrome: Secondary | ICD-10-CM | POA: Diagnosis not present

## 2016-02-05 DIAGNOSIS — G8929 Other chronic pain: Secondary | ICD-10-CM | POA: Diagnosis not present
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
## 2016-02-05 NOTE — Patient Instructions (Signed)
Radiofrequency Lesioning Introduction Radiofrequency lesioning is a procedure that is performed to relieve pain. The procedure is often used for back, neck, or arm pain. Radiofrequency lesioning involves the use of a machine that creates radio waves to make heat. During the procedure, the heat is applied to the nerve that carries the pain signal. The heat damages the nerve and interferes with the pain signal. Pain relief usually starts about 2 weeks after the procedure and lasts for 6 months to 1 year. Tell a health care provider about:  Any allergies you have.  All medicines you are taking, including vitamins, herbs, eye drops, creams, and over-the-counter medicines.  Any problems you or family members have had with anesthetic medicines.  Any blood disorders you have.  Any surgeries you have had.  Any medical conditions you have.  Whether you are pregnant or may be pregnant. What are the risks? Generally, this is a safe procedure. However, problems may occur, including:  Pain or soreness at the injection site.  Infection at the injection site.  Damage to nerves or blood vessels. What happens before the procedure?  Ask your health care provider about:  Changing or stopping your regular medicines. This is especially important if you are taking diabetes medicines or blood thinners.  Taking medicines such as aspirin and ibuprofen. These medicines can thin your blood. Do not take these medicines before your procedure if your health care provider instructs you not to.  Follow instructions from your health care provider about eating or drinking restrictions.  Plan to have someone take you home after the procedure.  If you go home right after the procedure, plan to have someone with you for 24 hours. What happens during the procedure?  You will be given one or more of the following:  A medicine to help you relax (sedative).  A medicine to numb the area (local anesthetic).  You  will be awake during the procedure. You will need to be able to talk with the health care provider during the procedure.  With the help of a type of X-ray (fluoroscopy), the health care provider will insert a radiofrequency needle into the area to be treated.  Next, a wire that carries the radio waves (electrode) will be put through the radiofrequency needle. An electrical pulse will be sent through the electrode to verify the correct nerve. You will feel a tingling sensation, and you may have muscle twitching.  Then, the tissue that is around the needle tip will be heated by an electric current that is passed using the radiofrequency machine. This will numb the nerves.  A bandage (dressing) will be put on the insertion area after the procedure is done. The procedure may vary among health care providers and hospitals. What happens after the procedure?  Your blood pressure, heart rate, breathing rate, and blood oxygen level will be monitored often until the medicines you were given have worn off.  Return to your normal activities as directed by your health care provider. This information is not intended to replace advice given to you by your health care provider. Make sure you discuss any questions you have with your health care provider. Document Released: 10/22/2010 Document Revised: 08/01/2015 Document Reviewed: 04/02/2014  2017 Elsevier

## 2016-02-18 ENCOUNTER — Ambulatory Visit: Payer: Medicare PPO

## 2016-03-06 ENCOUNTER — Other Ambulatory Visit: Payer: Self-pay | Admitting: Internal Medicine

## 2016-03-09 ENCOUNTER — Other Ambulatory Visit: Payer: Self-pay | Admitting: Internal Medicine

## 2016-03-10 ENCOUNTER — Ambulatory Visit (INDEPENDENT_AMBULATORY_CARE_PROVIDER_SITE_OTHER): Payer: Medicare PPO | Admitting: General Practice

## 2016-03-10 ENCOUNTER — Other Ambulatory Visit (INDEPENDENT_AMBULATORY_CARE_PROVIDER_SITE_OTHER): Payer: Medicare PPO

## 2016-03-10 ENCOUNTER — Ambulatory Visit (INDEPENDENT_AMBULATORY_CARE_PROVIDER_SITE_OTHER): Payer: Medicare PPO | Admitting: Physical Medicine and Rehabilitation

## 2016-03-10 ENCOUNTER — Encounter (INDEPENDENT_AMBULATORY_CARE_PROVIDER_SITE_OTHER): Payer: Self-pay | Admitting: Physical Medicine and Rehabilitation

## 2016-03-10 VITALS — BP 162/84 | HR 65

## 2016-03-10 DIAGNOSIS — Z0001 Encounter for general adult medical examination with abnormal findings: Secondary | ICD-10-CM

## 2016-03-10 DIAGNOSIS — M47816 Spondylosis without myelopathy or radiculopathy, lumbar region: Secondary | ICD-10-CM

## 2016-03-10 DIAGNOSIS — Z23 Encounter for immunization: Secondary | ICD-10-CM | POA: Diagnosis not present

## 2016-03-10 DIAGNOSIS — M545 Low back pain: Secondary | ICD-10-CM | POA: Diagnosis not present

## 2016-03-10 DIAGNOSIS — E119 Type 2 diabetes mellitus without complications: Secondary | ICD-10-CM | POA: Diagnosis not present

## 2016-03-10 DIAGNOSIS — Z794 Long term (current) use of insulin: Secondary | ICD-10-CM | POA: Diagnosis not present

## 2016-03-10 DIAGNOSIS — G8929 Other chronic pain: Secondary | ICD-10-CM | POA: Diagnosis not present

## 2016-03-10 LAB — CBC WITH DIFFERENTIAL/PLATELET
BASOS PCT: 0.5 % (ref 0.0–3.0)
Basophils Absolute: 0 10*3/uL (ref 0.0–0.1)
Eosinophils Absolute: 0.1 10*3/uL (ref 0.0–0.7)
Eosinophils Relative: 1.6 % (ref 0.0–5.0)
HEMATOCRIT: 37.4 % (ref 36.0–46.0)
HEMOGLOBIN: 12.6 g/dL (ref 12.0–15.0)
Lymphocytes Relative: 28.7 % (ref 12.0–46.0)
Lymphs Abs: 2.7 10*3/uL (ref 0.7–4.0)
MCHC: 33.7 g/dL (ref 30.0–36.0)
MCV: 88.4 fl (ref 78.0–100.0)
MONOS PCT: 6.7 % (ref 3.0–12.0)
Monocytes Absolute: 0.6 10*3/uL (ref 0.1–1.0)
Neutro Abs: 5.9 10*3/uL (ref 1.4–7.7)
Neutrophils Relative %: 62.5 % (ref 43.0–77.0)
Platelets: 242 10*3/uL (ref 150.0–400.0)
RBC: 4.23 Mil/uL (ref 3.87–5.11)
RDW: 14.6 % (ref 11.5–15.5)
WBC: 9.5 10*3/uL (ref 4.0–10.5)

## 2016-03-10 LAB — TSH: TSH: 2.5 u[IU]/mL (ref 0.35–4.50)

## 2016-03-10 LAB — HEPATIC FUNCTION PANEL
ALBUMIN: 4.1 g/dL (ref 3.5–5.2)
ALK PHOS: 50 U/L (ref 39–117)
ALT: 12 U/L (ref 0–35)
AST: 14 U/L (ref 0–37)
Bilirubin, Direct: 0 mg/dL (ref 0.0–0.3)
TOTAL PROTEIN: 7.1 g/dL (ref 6.0–8.3)
Total Bilirubin: 0.4 mg/dL (ref 0.2–1.2)

## 2016-03-10 LAB — URINALYSIS, ROUTINE W REFLEX MICROSCOPIC
BILIRUBIN URINE: NEGATIVE
HGB URINE DIPSTICK: NEGATIVE
KETONES UR: NEGATIVE
NITRITE: NEGATIVE
PH: 7 (ref 5.0–8.0)
RBC / HPF: NONE SEEN (ref 0–?)
Specific Gravity, Urine: 1.01 (ref 1.000–1.030)
Total Protein, Urine: NEGATIVE
Urine Glucose: NEGATIVE
Urobilinogen, UA: 0.2 (ref 0.0–1.0)

## 2016-03-10 LAB — MICROALBUMIN / CREATININE URINE RATIO
CREATININE, U: 86.8 mg/dL
MICROALB UR: 1.6 mg/dL (ref 0.0–1.9)
Microalb Creat Ratio: 1.8 mg/g (ref 0.0–30.0)

## 2016-03-10 LAB — LIPID PANEL
CHOLESTEROL: 152 mg/dL (ref 0–200)
HDL: 45.6 mg/dL (ref >39.00)
LDL CALC: 76 mg/dL (ref 0–99)
NonHDL: 106.4
TRIGLYCERIDES: 151 mg/dL — AB (ref 0.0–149.0)
Total CHOL/HDL Ratio: 3
VLDL: 30.2 mg/dL (ref 0.0–40.0)

## 2016-03-10 LAB — BASIC METABOLIC PANEL
BUN: 12 mg/dL (ref 6–23)
CHLORIDE: 101 meq/L (ref 96–112)
CO2: 31 meq/L (ref 19–32)
Calcium: 9.1 mg/dL (ref 8.4–10.5)
Creatinine, Ser: 0.76 mg/dL (ref 0.40–1.20)
GFR: 79 mL/min (ref >60.00)
GLUCOSE: 115 mg/dL — AB (ref 70–99)
POTASSIUM: 3.6 meq/L (ref 3.5–5.1)
SODIUM: 138 meq/L (ref 135–145)

## 2016-03-10 LAB — HEMOGLOBIN A1C: HEMOGLOBIN A1C: 7.1 % — AB (ref 4.6–6.5)

## 2016-03-10 NOTE — Telephone Encounter (Signed)
faxed

## 2016-03-10 NOTE — Telephone Encounter (Signed)
Done hardcopy to corinne 
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
## 2016-03-11 ENCOUNTER — Ambulatory Visit: Payer: Medicare PPO | Attending: Physical Medicine and Rehabilitation | Admitting: Physical Therapy

## 2016-03-11 DIAGNOSIS — M5442 Lumbago with sciatica, left side: Secondary | ICD-10-CM | POA: Insufficient documentation

## 2016-03-11 DIAGNOSIS — G8929 Other chronic pain: Secondary | ICD-10-CM | POA: Diagnosis not present

## 2016-03-11 DIAGNOSIS — M6281 Muscle weakness (generalized): Secondary | ICD-10-CM

## 2016-03-11 DIAGNOSIS — M62830 Muscle spasm of back: Secondary | ICD-10-CM

## 2016-03-11 DIAGNOSIS — M5441 Lumbago with sciatica, right side: Secondary | ICD-10-CM | POA: Insufficient documentation
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
## 2016-03-16 ENCOUNTER — Ambulatory Visit: Payer: Medicare PPO | Admitting: Physical Therapy

## 2016-03-17 ENCOUNTER — Ambulatory Visit (INDEPENDENT_AMBULATORY_CARE_PROVIDER_SITE_OTHER): Payer: Medicare PPO | Admitting: Internal Medicine

## 2016-03-17 ENCOUNTER — Encounter: Payer: Self-pay | Admitting: Internal Medicine

## 2016-03-17 VITALS — BP 140/80 | HR 77 | Temp 98.3°F | Resp 20 | Wt 167.0 lb

## 2016-03-17 DIAGNOSIS — J309 Allergic rhinitis, unspecified: Secondary | ICD-10-CM | POA: Diagnosis not present

## 2016-03-17 DIAGNOSIS — E119 Type 2 diabetes mellitus without complications: Secondary | ICD-10-CM

## 2016-03-17 DIAGNOSIS — I10 Essential (primary) hypertension: Secondary | ICD-10-CM

## 2016-03-17 DIAGNOSIS — Z794 Long term (current) use of insulin: Secondary | ICD-10-CM | POA: Diagnosis not present

## 2016-03-17 DIAGNOSIS — Z0000 Encounter for general adult medical examination without abnormal findings: Secondary | ICD-10-CM | POA: Diagnosis not present

## 2016-03-17 DIAGNOSIS — M545 Low back pain: Secondary | ICD-10-CM

## 2016-03-17 DIAGNOSIS — E2839 Other primary ovarian failure: Secondary | ICD-10-CM

## 2016-03-17 DIAGNOSIS — G8929 Other chronic pain: Secondary | ICD-10-CM

## 2016-03-17 DIAGNOSIS — Z0001 Encounter for general adult medical examination with abnormal findings: Secondary | ICD-10-CM

## 2016-03-17 MED ORDER — CETIRIZINE HCL 10 MG PO TABS: 10.0000 mg | ORAL_TABLET | Freq: Every day | ORAL | 11 refills | Status: DC

## 2016-03-17 MED ORDER — TRIAMCINOLONE ACETONIDE 55 MCG/ACT NA AERO: 2.0000 | INHALATION_SPRAY | Freq: Every day | NASAL | 12 refills | Status: DC

## 2016-03-17 MED ORDER — METHYLPREDNISOLONE ACETATE 80 MG/ML IJ SUSP
80.0000 mg | Freq: Once | INTRAMUSCULAR | Status: AC
  Administered 2016-03-17: 80 mg via INTRAMUSCULAR

## 2016-03-17 NOTE — Assessment & Plan Note (Addendum)
Mild to mod, for depomedorl IM 80, then zyrtec/nasacort asd,  to f/u any worsening symptoms or concerns  In addition to the time spent performing CPE, I spent an additional 15 minutes face to face,in which greater than 50% of this time was spent in counseling and coordination of care for patient's illness as documented.

## 2016-03-17 NOTE — Assessment & Plan Note (Signed)
With ongonig pain, cont PT, f/u ortho

## 2016-03-17 NOTE — Assessment & Plan Note (Signed)

## 2016-03-17 NOTE — Patient Instructions (Addendum)
You had the steroid shot today  Please take all new medication as prescribed - the zyrtec and nasacort  Please continue all other medications as before, and refills have been done if requested.  Please have the pharmacy call with any other refills you may need.  Please continue your efforts at being more active, low cholesterol diet, and weight control.  You are otherwise up to date with prevention measures today.  Please keep your appointments with your specialists as you may have planned  Please schedule the bone density test before leaving today at the scheduling desk (where you check out)  Please return in 6 months, or sooner if needed, with Lab testing done 3-5 days before

## 2016-03-17 NOTE — Assessment & Plan Note (Signed)
stable overall by history and exam, recent data reviewed with pt, and pt to continue medical treatment as before,  to f/u any worsening symptoms or concerns Lab Results  Component Value Date   HGBA1C 7.1 (H) 03/10/2016    
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
## 2016-03-17 NOTE — Addendum Note (Signed)
Addended by: Della Goo C on: 03/17/2016 02:52 PM   Modules accepted: Orders

## 2016-03-17 NOTE — Assessment & Plan Note (Signed)
stable overall by history and exam, recent data reviewed with pt, and pt to continue medical treatment as before,  to f/u any worsening symptoms or concerns BP Readings from Last 3 Encounters:  03/17/16 140/80  03/10/16 (!) 162/84  02/05/16 (!) 149/78

## 2016-03-17 NOTE — Progress Notes (Signed)
Pre visit review using our clinic review tool, if applicable. No additional management support is needed unless otherwise documented below in the visit note. 

## 2016-03-18 ENCOUNTER — Ambulatory Visit: Payer: Medicare PPO | Admitting: Physical Therapy

## 2016-03-18 ENCOUNTER — Encounter: Payer: Self-pay | Admitting: Physical Therapy

## 2016-03-18 DIAGNOSIS — M62830 Muscle spasm of back: Secondary | ICD-10-CM

## 2016-03-18 DIAGNOSIS — M5441 Lumbago with sciatica, right side: Principal | ICD-10-CM

## 2016-03-18 DIAGNOSIS — M5442 Lumbago with sciatica, left side: Secondary | ICD-10-CM | POA: Diagnosis not present

## 2016-03-18 DIAGNOSIS — M6281 Muscle weakness (generalized): Secondary | ICD-10-CM

## 2016-03-18 DIAGNOSIS — G8929 Other chronic pain: Secondary | ICD-10-CM
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
## 2016-03-23 ENCOUNTER — Ambulatory Visit: Payer: Medicare PPO | Admitting: Physical Therapy

## 2016-03-23 ENCOUNTER — Encounter: Payer: Self-pay | Admitting: Physical Therapy

## 2016-03-23 DIAGNOSIS — M6281 Muscle weakness (generalized): Secondary | ICD-10-CM

## 2016-03-23 DIAGNOSIS — M5441 Lumbago with sciatica, right side: Principal | ICD-10-CM

## 2016-03-23 DIAGNOSIS — G8929 Other chronic pain: Secondary | ICD-10-CM | POA: Diagnosis not present

## 2016-03-23 DIAGNOSIS — M5442 Lumbago with sciatica, left side: Secondary | ICD-10-CM | POA: Diagnosis not present

## 2016-03-23 DIAGNOSIS — M62830 Muscle spasm of back: Secondary | ICD-10-CM | POA: Diagnosis not present
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
## 2016-03-25 ENCOUNTER — Ambulatory Visit: Payer: Medicare PPO | Admitting: Physical Therapy

## 2016-03-30 ENCOUNTER — Ambulatory Visit: Payer: Medicare PPO | Admitting: Physical Therapy

## 2016-03-30 ENCOUNTER — Other Ambulatory Visit: Payer: Self-pay | Admitting: Internal Medicine

## 2016-03-30 ENCOUNTER — Encounter: Payer: Self-pay | Admitting: Physical Therapy

## 2016-03-30 DIAGNOSIS — M6281 Muscle weakness (generalized): Secondary | ICD-10-CM | POA: Diagnosis not present

## 2016-03-30 DIAGNOSIS — M5441 Lumbago with sciatica, right side: Principal | ICD-10-CM

## 2016-03-30 DIAGNOSIS — G8929 Other chronic pain: Secondary | ICD-10-CM | POA: Diagnosis not present

## 2016-03-30 DIAGNOSIS — M5442 Lumbago with sciatica, left side: Secondary | ICD-10-CM | POA: Diagnosis not present

## 2016-03-30 DIAGNOSIS — M62830 Muscle spasm of back: Secondary | ICD-10-CM | POA: Diagnosis not present

## 2016-03-30 NOTE — Patient Instructions (Signed)

## 2016-03-30 NOTE — Telephone Encounter (Signed)
Routing to dr john, please advise, thanks 
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
## 2016-03-31 ENCOUNTER — Other Ambulatory Visit: Payer: Self-pay | Admitting: Internal Medicine

## 2016-03-31 NOTE — Telephone Encounter (Signed)
Done hardcopy to Corinne  

## 2016-03-31 NOTE — Telephone Encounter (Signed)
Already addressed earlier today

## 2016-03-31 NOTE — Telephone Encounter (Signed)
faxed

## 2016-04-01 ENCOUNTER — Ambulatory Visit: Payer: Medicare PPO | Admitting: Physical Therapy

## 2016-04-01 ENCOUNTER — Telehealth (INDEPENDENT_AMBULATORY_CARE_PROVIDER_SITE_OTHER): Payer: Self-pay | Admitting: Physical Medicine and Rehabilitation

## 2016-04-01 ENCOUNTER — Other Ambulatory Visit: Payer: Self-pay | Admitting: Internal Medicine

## 2016-04-01 ENCOUNTER — Encounter: Payer: Self-pay | Admitting: Physical Therapy

## 2016-04-01 DIAGNOSIS — M5441 Lumbago with sciatica, right side: Secondary | ICD-10-CM | POA: Diagnosis not present

## 2016-04-01 DIAGNOSIS — M6281 Muscle weakness (generalized): Secondary | ICD-10-CM

## 2016-04-01 DIAGNOSIS — M5442 Lumbago with sciatica, left side: Principal | ICD-10-CM

## 2016-04-01 DIAGNOSIS — I10 Essential (primary) hypertension: Secondary | ICD-10-CM

## 2016-04-01 DIAGNOSIS — M62830 Muscle spasm of back: Secondary | ICD-10-CM

## 2016-04-01 DIAGNOSIS — G8929 Other chronic pain: Secondary | ICD-10-CM

## 2016-04-01 NOTE — Telephone Encounter (Signed)
Radiofrequency ablation of the L4-5 and L5-S1 facet joints bilaterally. Remind me preprocedure we can try hydroxyzine for sedation.
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
## 2016-04-02 NOTE — Telephone Encounter (Signed)
Faxed [employer] auth form, yesterdays PT note, last 3 office notes from Dr. Ernestina Patches and CT report to [facility] @ [phone]

## 2016-04-03 ENCOUNTER — Telehealth (INDEPENDENT_AMBULATORY_CARE_PROVIDER_SITE_OTHER): Payer: Self-pay | Admitting: Physical Medicine and Rehabilitation

## 2016-04-03 NOTE — Telephone Encounter (Signed)
Called patient and let her know we had submitted notes for insurance auth and that we would call her to schedule when we hear from them.

## 2016-04-06 ENCOUNTER — Ambulatory Visit: Payer: Medicare PPO | Admitting: Physical Therapy

## 2016-04-07 ENCOUNTER — Ambulatory Visit (INDEPENDENT_AMBULATORY_CARE_PROVIDER_SITE_OTHER): Payer: Medicare PPO | Admitting: *Deleted

## 2016-04-07 DIAGNOSIS — I519 Heart disease, unspecified: Secondary | ICD-10-CM

## 2016-04-07 DIAGNOSIS — I428 Other cardiomyopathies: Secondary | ICD-10-CM | POA: Diagnosis not present

## 2016-04-07 NOTE — Telephone Encounter (Signed)
Received denial for procedure. Dr. Ernestina Patches looking over denial letter.

## 2016-04-07 NOTE — Progress Notes (Signed)
Remote ICD transmission.   

## 2016-04-08 ENCOUNTER — Other Ambulatory Visit: Payer: Self-pay | Admitting: Internal Medicine

## 2016-04-08 ENCOUNTER — Ambulatory Visit: Payer: Medicare PPO | Admitting: Physical Therapy

## 2016-04-08 ENCOUNTER — Encounter: Payer: Self-pay | Admitting: Cardiology

## 2016-04-09 NOTE — Telephone Encounter (Signed)
Waiting on prior authorization.

## 2016-04-09 NOTE — Telephone Encounter (Signed)
Requesting auth for bilateral facet blocks per Dr. Ernestina Patches. Faxed in [place] form with notes to [place] [phone] for codes [phone] and [phone]

## 2016-04-13 ENCOUNTER — Encounter: Payer: Self-pay | Admitting: Physical Therapy

## 2016-04-13 ENCOUNTER — Ambulatory Visit: Payer: Medicare PPO | Attending: Physical Medicine and Rehabilitation | Admitting: Physical Therapy

## 2016-04-13 DIAGNOSIS — M62830 Muscle spasm of back: Secondary | ICD-10-CM | POA: Diagnosis not present

## 2016-04-13 DIAGNOSIS — M5442 Lumbago with sciatica, left side: Secondary | ICD-10-CM | POA: Diagnosis not present

## 2016-04-13 DIAGNOSIS — M5441 Lumbago with sciatica, right side: Secondary | ICD-10-CM | POA: Diagnosis not present

## 2016-04-13 DIAGNOSIS — G8929 Other chronic pain: Secondary | ICD-10-CM | POA: Diagnosis not present

## 2016-04-13 DIAGNOSIS — M6281 Muscle weakness (generalized): Secondary | ICD-10-CM | POA: Diagnosis not present
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
## 2016-04-20 ENCOUNTER — Ambulatory Visit: Payer: Medicare PPO | Admitting: Physical Therapy

## 2016-04-20 ENCOUNTER — Encounter: Payer: Self-pay | Admitting: Physical Therapy

## 2016-04-20 DIAGNOSIS — G8929 Other chronic pain: Secondary | ICD-10-CM

## 2016-04-20 DIAGNOSIS — M6281 Muscle weakness (generalized): Secondary | ICD-10-CM

## 2016-04-20 DIAGNOSIS — M62830 Muscle spasm of back: Secondary | ICD-10-CM

## 2016-04-20 DIAGNOSIS — M5442 Lumbago with sciatica, left side: Principal | ICD-10-CM

## 2016-04-20 DIAGNOSIS — M5441 Lumbago with sciatica, right side: Principal | ICD-10-CM

## 2016-04-20 LAB — CUP PACEART REMOTE DEVICE CHECK
Battery Remaining Longevity: 68 mo
Battery Remaining Percentage: 81 %
Battery Voltage: 3.02 V
Brady Statistic AP VP Percent: 1.9 %
Brady Statistic AS VS Percent: 1 %
Brady Statistic RA Percent Paced: 1.8 %
Date Time Interrogation Session: 20180130070015
HIGH POWER IMPEDANCE MEASURED VALUE: 89 Ohm
HighPow Impedance: 89 Ohm
Implantable Lead Implant Date: 20100930
Implantable Lead Location: 753858
Implantable Pulse Generator Implant Date: 20161215
Lead Channel Impedance Value: 540 Ohm
Lead Channel Impedance Value: 610 Ohm
Lead Channel Pacing Threshold Amplitude: 0.75 V
Lead Channel Pacing Threshold Amplitude: 1 V
Lead Channel Pacing Threshold Pulse Width: 0.5 ms
Lead Channel Pacing Threshold Pulse Width: 0.5 ms
Lead Channel Pacing Threshold Pulse Width: 0.5 ms
Lead Channel Sensing Intrinsic Amplitude: 1.8 mV
Lead Channel Sensing Intrinsic Amplitude: 12 mV
Lead Channel Setting Pacing Amplitude: 2 V
Lead Channel Setting Pacing Amplitude: 2 V
Lead Channel Setting Pacing Pulse Width: 0.5 ms
Lead Channel Setting Pacing Pulse Width: 0.5 ms
MDC IDC LEAD IMPLANT DT: 20100930
MDC IDC LEAD IMPLANT DT: 20100930
MDC IDC LEAD LOCATION: 753859
MDC IDC LEAD LOCATION: 753860
MDC IDC MSMT LEADCHNL RA IMPEDANCE VALUE: 350 Ohm
MDC IDC MSMT LEADCHNL RA PACING THRESHOLD AMPLITUDE: 0.75 V
MDC IDC PG SERIAL: 7247188
MDC IDC SET LEADCHNL RV PACING AMPLITUDE: 2.5 V
MDC IDC SET LEADCHNL RV SENSING SENSITIVITY: 0.5 mV
MDC IDC STAT BRADY AP VS PERCENT: 1 %
MDC IDC STAT BRADY AS VP PERCENT: 98 %

## 2016-04-20 NOTE — Telephone Encounter (Signed)
Spoke with pt and scheduled her for tomorrow 04/21/16 @ 2:45 w/driver

## 2016-04-20 NOTE — Telephone Encounter (Signed)
Received auth for bilateral [id number] and [id number]. Auth#[id number]. eff 04/09/16-05/24/16. Left message # 1 for pt to call back for scheduling.

## 2016-04-21 ENCOUNTER — Encounter (INDEPENDENT_AMBULATORY_CARE_PROVIDER_SITE_OTHER): Payer: Medicare PPO | Admitting: Physical Medicine and Rehabilitation
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
## 2016-04-22 ENCOUNTER — Ambulatory Visit: Payer: Medicare PPO | Admitting: Physical Therapy

## 2016-04-26 ENCOUNTER — Other Ambulatory Visit: Payer: Self-pay | Admitting: Cardiovascular Disease

## 2016-04-27 ENCOUNTER — Encounter (INDEPENDENT_AMBULATORY_CARE_PROVIDER_SITE_OTHER): Payer: Medicare PPO | Admitting: Physical Medicine and Rehabilitation

## 2016-04-27 ENCOUNTER — Ambulatory Visit: Payer: Medicare PPO | Admitting: Physical Therapy

## 2016-04-29 ENCOUNTER — Ambulatory Visit (INDEPENDENT_AMBULATORY_CARE_PROVIDER_SITE_OTHER)
Admission: RE | Admit: 2016-04-29 | Discharge: 2016-04-29 | Disposition: A | Discharge status: Home / Self Care | Payer: Medicare PPO | Source: Ambulatory Visit | Attending: Internal Medicine | Admitting: Internal Medicine

## 2016-04-29 DIAGNOSIS — E2839 Other primary ovarian failure: Secondary | ICD-10-CM

## 2016-05-04 ENCOUNTER — Ambulatory Visit: Payer: Medicare PPO | Admitting: Physical Therapy

## 2016-05-07 ENCOUNTER — Encounter: Payer: Self-pay | Admitting: Internal Medicine

## 2016-05-09 ENCOUNTER — Other Ambulatory Visit: Payer: Self-pay | Admitting: Internal Medicine

## 2016-05-11 ENCOUNTER — Ambulatory Visit (INDEPENDENT_AMBULATORY_CARE_PROVIDER_SITE_OTHER): Payer: Medicare PPO

## 2016-05-11 ENCOUNTER — Ambulatory Visit: Payer: Medicare PPO | Admitting: Physical Therapy

## 2016-05-11 ENCOUNTER — Ambulatory Visit (INDEPENDENT_AMBULATORY_CARE_PROVIDER_SITE_OTHER): Payer: Medicare PPO | Admitting: Physical Medicine and Rehabilitation

## 2016-05-11 ENCOUNTER — Encounter (INDEPENDENT_AMBULATORY_CARE_PROVIDER_SITE_OTHER): Payer: Self-pay | Admitting: Physical Medicine and Rehabilitation

## 2016-05-11 VITALS — BP 157/78 | HR 63

## 2016-05-11 DIAGNOSIS — M47816 Spondylosis without myelopathy or radiculopathy, lumbar region: Secondary | ICD-10-CM

## 2016-05-11 MED ORDER — LIDOCAINE HCL (PF) 1 % IJ SOLN
0.3300 mL | Freq: Once | INTRAMUSCULAR | Status: AC
  Administered 2016-05-11: 0.3 mL

## 2016-05-11 MED ORDER — BUPIVACAINE HCL 0.5 % IJ SOLN
50.0000 mL | Freq: Once | INTRAMUSCULAR | Status: AC
  Administered 2016-05-11: 50 mL
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
## 2016-05-11 NOTE — Patient Instructions (Signed)
.  Pain Diary:     Percent Relief   0% 25% 50% 75% 100%  Post       1 hour       2 hour       3 hour       4 hour         [facility] Physiat[facility] Discharge Instructions  *At any time if you have questions or concerns they can be answered by calling [phone]  All Patients: . You may experience an increase in your symptoms for the first 2 days (it can take 2 days to 2 weeks for the steroid/cortisone to have its maximal effect). . You may use ice to the site for the first 24 hours; 20 minutes on and 20 minutes off and may use heat after that time. . You may resume and continue your current pain medications. If you need a refill please contact the prescribing physician. . You may resume her regular medications if any were stopped for the procedure. . You may shower but no swimming, tub bath or Jacuzzi for 24 hours. . Please remove bandage after 4 hours. . You may resume light activities as tolerated. . If you had Spine Injection, you should not drive for the next 3 hours due to anesthetics used in the procedure. Please have someone drive for you.  *If you have had sedation, Valium, Xanax, or lorazepam: Do not drive or use public transportation for 24 hours, do not operating hazardous machinery or make important personal/business decisions for 24 hours.  POSSIBLE STEROID SIDE EFFECTS: If experienced these should only last for a short period. Change in menstrual flow  Edema in (swelling)  Increased appetite Skin flushing (redness)  Skin rash/acne  Thrush (oral) Vaginitis    Increased sweating  Depression Increased blood glucose levels Cramping and leg/calf  Euphoria (feeling happy)  POSSIBLE PROCEDURE SIDE EFFECTS: Please call our office if concerned. Increased pain Increased numbness/tingling  Headache Nausea/vomiting Hematoma (bruising/bleeding) Edema (swelling at the site) Weakness  Infection (red/drainage at site) Fever greater than 100.5F  *In the event of a headache  after epidural steroid injection: Drink plenty of fluids, especially water and try to lay flat when possible. If the headache does not get better after a few days or as always if concerned please call the office.  

## 2016-05-12 ENCOUNTER — Other Ambulatory Visit: Payer: Self-pay | Admitting: Internal Medicine

## 2016-05-12 NOTE — Procedures (Signed)
Lumbar Diagnostic Facet Joint Nerve Block with Fluoroscopic Guidance   Patient: Carolyn [name]      Date of Birth: [birth year] MRN: [id number] PCP: Cathlean Cower, MD      Visit Date: 05/11/2016   Universal Protocol:    Date/Time: 03/06/186:04 AM  Consent Given By: the patient  Position: PRONE  Additional Comments: Vital signs were monitored before and after the procedure. Patient was prepped and draped in the usual sterile fashion. The correct patient, procedure, and site was verified.   Injection Procedure Details:  Procedure Site One Meds Administered:  Meds ordered this encounter  Medications  . lidocaine (PF) (XYLOCAINE) 1 % injection 0.3 mL  . bupivacaine (MARCAINE) 0.5 % (with pres) injection 50 mL     Laterality: Bilateral  Location/Site:  L4-L5 L5-S1  Needle size: 22 G  Needle type: spinal needle  Needle Placement: Oblique pedical  Findings:  -Contrast Used: 2 mL iohexol 180 mg iodine/mL   -Comments: Excellent flow of contrast along the articular pillars without intravascular flow  Procedure Details: The fluoroscope beam is vertically oriented in AP and then obliqued 15 to 20 degrees to the ipsilateral side of the desired nerve to achieve the "Scotty dog" appearance.  The skin over the target area of the junction of the superior articulating process and the transverse process (sacral ala if blocking the L5 dorsal rami) was locally anesthetized with a 1 ml volume of 1% Lidocaine without Epinephrine.  The spinal needle was inserted and advanced in a trajectory view down to the target.   After contact with periosteum and negative aspirate for blood and CSF, correct placement without intravascular or epidural spread was confirmed by injecting 0.5 ml. of Isovue-250.  A spot radiograph was obtained of this image.    Next, a 0.5 ml. volume of the injectate described above was injected. The needle was then redirected to the other facet joint nerves mentioned above if  needed.  Prior to the procedure, the patient was given a Pain Diary which was completed for baseline measurements.  After the procedure, the patient rated their pain every 30 minutes and will continue rating at this frequency for a total of 5 hours.  The patient has been asked to complete the Diary and return to [place] by mail, fax or hand delivered as soon as possible.   Additional Comments:  The patient tolerated the procedure well Dressing: Band-Aid    Post-procedure details: Patient was observed during the procedure. Post-procedure instructions were reviewed.  Patient left the clinic in stable condition.

## 2016-05-13 ENCOUNTER — Telehealth (INDEPENDENT_AMBULATORY_CARE_PROVIDER_SITE_OTHER): Payer: Self-pay

## 2016-05-13 NOTE — Telephone Encounter (Signed)
Patient called with pain diary results from injection on Monday.post injection-100 % relief, 1 hr-75%, 2nd hr-50 %, 4th hour 0%.  Please advise on how to proceed. Has [employer] Ins.

## 2016-05-14 NOTE — Telephone Encounter (Signed)
She needs second block ie repeat. Only used marcaine so can do next avail. We need writtten documentation, pain diary if possible she can mail

## 2016-05-20 NOTE — Telephone Encounter (Signed)
Faxed auth form with notes to [facility]

## 2016-05-26 NOTE — Telephone Encounter (Signed)
Scheduled for 06/09/16 at 1300.

## 2016-05-26 NOTE — Telephone Encounter (Signed)
Received auth. Auth# [id number]. Left message for pt to call back for scheduling.

## 2016-06-09 ENCOUNTER — Ambulatory Visit (INDEPENDENT_AMBULATORY_CARE_PROVIDER_SITE_OTHER): Payer: Self-pay

## 2016-06-09 ENCOUNTER — Encounter (INDEPENDENT_AMBULATORY_CARE_PROVIDER_SITE_OTHER): Payer: Self-pay | Admitting: Physical Medicine and Rehabilitation

## 2016-06-09 ENCOUNTER — Ambulatory Visit (INDEPENDENT_AMBULATORY_CARE_PROVIDER_SITE_OTHER): Payer: Medicare PPO | Admitting: Physical Medicine and Rehabilitation

## 2016-06-09 VITALS — BP 141/67 | HR 71

## 2016-06-09 DIAGNOSIS — M47816 Spondylosis without myelopathy or radiculopathy, lumbar region: Secondary | ICD-10-CM

## 2016-06-09 MED ORDER — LIDOCAINE HCL (PF) 1 % IJ SOLN
0.3300 mL | Freq: Once | INTRAMUSCULAR | Status: AC
  Administered 2016-06-09: 0.3 mL

## 2016-06-09 MED ORDER — BUPIVACAINE HCL 0.5 % IJ SOLN
50.0000 mL | Freq: Once | INTRAMUSCULAR | Status: AC
  Administered 2016-06-09: 50 mL

## 2016-06-09 NOTE — Patient Instructions (Signed)
.  Pain Diary:     Percent Relief   0% 25% 50% 75% 100%  Post       1 hour       2 hour       3 hour       4 hour         [facility] Physiat[facility] Discharge Instructions  *At any time if you have questions or concerns they can be answered by calling [phone]  All Patients: . You may experience an increase in your symptoms for the first 2 days (it can take 2 days to 2 weeks for the steroid/cortisone to have its maximal effect). . You may use ice to the site for the first 24 hours; 20 minutes on and 20 minutes off and may use heat after that time. . You may resume and continue your current pain medications. If you need a refill please contact the prescribing physician. . You may resume her regular medications if any were stopped for the procedure. . You may shower but no swimming, tub bath or Jacuzzi for 24 hours. . Please remove bandage after 4 hours. . You may resume light activities as tolerated. . If you had Spine Injection, you should not drive for the next 3 hours due to anesthetics used in the procedure. Please have someone drive for you.  *If you have had sedation, Valium, Xanax, or lorazepam: Do not drive or use public transportation for 24 hours, do not operating hazardous machinery or make important personal/business decisions for 24 hours.  POSSIBLE STEROID SIDE EFFECTS: If experienced these should only last for a short period. Change in menstrual flow  Edema in (swelling)  Increased appetite Skin flushing (redness)  Skin rash/acne  Thrush (oral) Vaginitis    Increased sweating  Depression Increased blood glucose levels Cramping and leg/calf  Euphoria (feeling happy)  POSSIBLE PROCEDURE SIDE EFFECTS: Please call our office if concerned. Increased pain Increased numbness/tingling  Headache Nausea/vomiting Hematoma (bruising/bleeding) Edema (swelling at the site) Weakness  Infection (red/drainage at site) Fever greater than 100.5F  *In the event of a headache  after epidural steroid injection: Drink plenty of fluids, especially water and try to lay flat when possible. If the headache does not get better after a few days or as always if concerned please call the office.  
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
## 2016-06-11 NOTE — Procedures (Signed)
Lumbar Diagnostic Facet Joint Nerve Block with Fluoroscopic Guidance   Patient: Carolyn [name]      Date of Birth: [birth year] MRN: [id number] PCP: Cathlean Cower, MD      Visit Date: 06/09/2016   Universal Protocol:    Date/Time: 04/05/185:35 AM  Consent Given By: the patient  Position: PRONE  Additional Comments: Vital signs were monitored before and after the procedure. Patient was prepped and draped in the usual sterile fashion. The correct patient, procedure, and site was verified.   Injection Procedure Details:  Procedure Site One Meds Administered:  Meds ordered this encounter  Medications  . lidocaine (PF) (XYLOCAINE) 1 % injection 0.3 mL  . bupivacaine (MARCAINE) 0.5 % (with pres) injection 50 mL     Laterality: Bilateral  Location/Site: L3 and L4 medial branches and L5 dorsal rami L4-L5 L5-S1  Needle size: 22 G  Needle type: spinal needle  Needle Placement: Oblique pedical  Findings:  -Contrast Used: 1 mL iohexol 180 mg iodine/mL   -Comments: Excellent flow of contrast along the articular. Pillars without intravascular flow  Procedure Details: The fluoroscope beam is vertically oriented in AP and then obliqued 15 to 20 degrees to the ipsilateral side of the desired nerve to achieve the "Scotty dog" appearance.  The skin over the target area of the junction of the superior articulating process and the transverse process (sacral ala if blocking the L5 dorsal rami) was locally anesthetized with a 1 ml volume of 1% Lidocaine without Epinephrine.  The spinal needle was inserted and advanced in a trajectory view down to the target.   After contact with periosteum and negative aspirate for blood and CSF, correct placement without intravascular or epidural spread was confirmed by injecting 0.5 ml. of Isovue-250.  A spot radiograph was obtained of this image.    Next, a 0.5 ml. volume of the injectate described above was injected. The needle was then redirected to the other  facet joint nerves mentioned above if needed.  Prior to the procedure, the patient was given a Pain Diary which was completed for baseline measurements.  After the procedure, the patient rated their pain every 30 minutes and will continue rating at this frequency for a total of 5 hours.  The patient has been asked to complete the Diary and return to [place] by mail, fax or hand delivered as soon as possible.   Additional Comments:  The patient tolerated the procedure well No complications occurred Dressing: Band-Aid    Post-procedure details: Patient was observed during the procedure. Post-procedure instructions were reviewed.  Patient left the clinic in stable condition.

## 2016-06-14 ENCOUNTER — Other Ambulatory Visit: Payer: Self-pay | Admitting: Cardiovascular Disease

## 2016-06-14 ENCOUNTER — Emergency Department (HOSPITAL_COMMUNITY): Payer: Medicare PPO

## 2016-06-14 ENCOUNTER — Encounter (HOSPITAL_COMMUNITY): Payer: Self-pay | Admitting: Emergency Medicine

## 2016-06-14 ENCOUNTER — Emergency Department (HOSPITAL_COMMUNITY)
Admission: EM | Admit: 2016-06-14 | Discharge: 2016-06-14 | Disposition: A | Discharge status: Home / Self Care | Payer: Medicare PPO | Attending: Emergency Medicine | Admitting: Emergency Medicine

## 2016-06-14 DIAGNOSIS — Y939 Activity, unspecified: Secondary | ICD-10-CM | POA: Diagnosis not present

## 2016-06-14 DIAGNOSIS — M25552 Pain in left hip: Secondary | ICD-10-CM | POA: Insufficient documentation

## 2016-06-14 DIAGNOSIS — Z79899 Other long term (current) drug therapy: Secondary | ICD-10-CM | POA: Insufficient documentation

## 2016-06-14 DIAGNOSIS — M79605 Pain in left leg: Secondary | ICD-10-CM | POA: Diagnosis not present

## 2016-06-14 DIAGNOSIS — Z950 Presence of cardiac pacemaker: Secondary | ICD-10-CM | POA: Insufficient documentation

## 2016-06-14 DIAGNOSIS — I10 Essential (primary) hypertension: Secondary | ICD-10-CM | POA: Insufficient documentation

## 2016-06-14 DIAGNOSIS — Y92009 Unspecified place in unspecified non-institutional (private) residence as the place of occurrence of the external cause: Secondary | ICD-10-CM | POA: Insufficient documentation

## 2016-06-14 DIAGNOSIS — F17210 Nicotine dependence, cigarettes, uncomplicated: Secondary | ICD-10-CM | POA: Diagnosis not present

## 2016-06-14 DIAGNOSIS — W1830XA Fall on same level, unspecified, initial encounter: Secondary | ICD-10-CM | POA: Diagnosis not present

## 2016-06-14 DIAGNOSIS — Y999 Unspecified external cause status: Secondary | ICD-10-CM | POA: Insufficient documentation

## 2016-06-14 DIAGNOSIS — E119 Type 2 diabetes mellitus without complications: Secondary | ICD-10-CM | POA: Insufficient documentation

## 2016-06-14 DIAGNOSIS — E039 Hypothyroidism, unspecified: Secondary | ICD-10-CM | POA: Diagnosis not present

## 2016-06-14 DIAGNOSIS — Z794 Long term (current) use of insulin: Secondary | ICD-10-CM | POA: Insufficient documentation

## 2016-06-14 DIAGNOSIS — W19XXXA Unspecified fall, initial encounter: Secondary | ICD-10-CM

## 2016-06-14 DIAGNOSIS — S79912A Unspecified injury of left hip, initial encounter: Secondary | ICD-10-CM | POA: Diagnosis not present

## 2016-06-14 DIAGNOSIS — S3993XA Unspecified injury of pelvis, initial encounter: Secondary | ICD-10-CM | POA: Diagnosis not present

## 2016-06-14 DIAGNOSIS — Z7982 Long term (current) use of aspirin: Secondary | ICD-10-CM | POA: Diagnosis not present

## 2016-06-14 MED ORDER — TRAMADOL HCL 50 MG PO TABS: 100.0000 mg | ORAL_TABLET | Freq: Three times a day (TID) | ORAL | 0 refills | Status: DC | PRN

## 2016-06-14 MED ORDER — OXYCODONE-ACETAMINOPHEN 5-325 MG PO TABS
1.0000 | ORAL_TABLET | Freq: Once | ORAL | Status: AC
  Administered 2016-06-14: 1 via ORAL
  Filled 2016-06-14: qty 1

## 2016-06-14 MED ORDER — TRAMADOL HCL 50 MG PO TABS
100.0000 mg | ORAL_TABLET | Freq: Once | ORAL | Status: AC
  Administered 2016-06-14: 100 mg via ORAL
  Filled 2016-06-14: qty 2

## 2016-06-14 NOTE — ED Triage Notes (Signed)
Pt in via EMS from home. Pt sts that she fell approx 1 week ago in her home. Pt has been ambulating and having mild pain, but reports that she started to have worse pain this am. Pt is A&O and in NAD

## 2016-06-14 NOTE — ED Notes (Signed)
ED Provider at bedside. 

## 2016-06-14 NOTE — ED Notes (Signed)
Bed: IP18 Expected date:  Expected time:  Means of arrival:  Comments: 76 yo leg pain-no injury

## 2016-06-14 NOTE — ED Triage Notes (Signed)
She states she fell one week ago today, landing on left side. She c/o left hip/leg pain since then. She states that her left leg became too painful today to ambulate.

## 2016-06-14 NOTE — Discharge Instructions (Signed)
As discussed, please follow-up with the primary care provider. You may take higher-dose of tramadol (2 tablets) as needed for severe pain no more than 3 times a day until you speak to your doctor tomorrow. Every 8 hours as needed. Use heat packs and rest your leg. Return to the emergency department if you experience numbness, tingling, worsening pain, swelling, redness, or any other new concerning symptoms. In the meantime
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
## 2016-07-04 ENCOUNTER — Other Ambulatory Visit: Payer: Self-pay | Admitting: Internal Medicine

## 2016-07-06 ENCOUNTER — Ambulatory Visit (INDEPENDENT_AMBULATORY_CARE_PROVIDER_SITE_OTHER): Payer: Medicare PPO | Admitting: Internal Medicine

## 2016-07-06 ENCOUNTER — Encounter: Payer: Self-pay | Admitting: Internal Medicine

## 2016-07-06 VITALS — BP 142/90 | HR 69 | Ht 60.0 in | Wt 162.0 lb

## 2016-07-06 DIAGNOSIS — I519 Heart disease, unspecified: Secondary | ICD-10-CM

## 2016-07-06 DIAGNOSIS — Z95810 Presence of automatic (implantable) cardiac defibrillator: Secondary | ICD-10-CM

## 2016-07-06 DIAGNOSIS — I428 Other cardiomyopathies: Secondary | ICD-10-CM | POA: Diagnosis not present

## 2016-07-06 LAB — CUP PACEART INCLINIC DEVICE CHECK
Brady Statistic RV Percent Paced: 99.88 %
Date Time Interrogation Session: 20180430163514
Implantable Lead Implant Date: 20100930
Implantable Lead Implant Date: 20100930
Implantable Lead Location: 753858
Implantable Lead Location: 753860
Implantable Lead Model: 7122
Implantable Pulse Generator Implant Date: 20161215
Lead Channel Pacing Threshold Amplitude: 0.75 V
Lead Channel Pacing Threshold Amplitude: 1 V
Lead Channel Pacing Threshold Pulse Width: 0.5 ms
Lead Channel Sensing Intrinsic Amplitude: 1.9 mV
Lead Channel Sensing Intrinsic Amplitude: 11.6 mV
Lead Channel Setting Pacing Amplitude: 2 V
Lead Channel Setting Sensing Sensitivity: 0.5 mV
MDC IDC LEAD IMPLANT DT: 20100930
MDC IDC LEAD LOCATION: 753859
MDC IDC MSMT LEADCHNL LV PACING THRESHOLD PULSEWIDTH: 0.5 ms
MDC IDC MSMT LEADCHNL RA PACING THRESHOLD AMPLITUDE: 0.75 V
MDC IDC MSMT LEADCHNL RV PACING THRESHOLD PULSEWIDTH: 0.5 ms
MDC IDC PG SERIAL: 7247188
MDC IDC SET LEADCHNL LV PACING AMPLITUDE: 2 V
MDC IDC SET LEADCHNL LV PACING PULSEWIDTH: 0.5 ms
MDC IDC SET LEADCHNL RV PACING AMPLITUDE: 2.5 V
MDC IDC SET LEADCHNL RV PACING PULSEWIDTH: 0.5 ms
MDC IDC STAT BRADY RA PERCENT PACED: 2.2 %
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
## 2016-07-06 NOTE — Patient Instructions (Signed)
Medication Instructions:  Your physician recommends that you continue on your current medications as directed. Please refer to the Current Medication list given to you today.  Follow-Up: Remote monitoring is used to monitor your ICD from home. This monitoring reduces the number of office visits required to check your device to one time per year. It allows [place] to keep an eye on the functioning of your device to ensure it is working properly. You are scheduled for a device check from home on 10/05/16. You may send your transmission at any time that day. If you have a wireless device, the transmission will be sent automatically. After your physician reviews your transmission, you will receive a postcard with your next transmission date.  Your physician wants you to follow-up in: 54 MONTHS with Dr. Rayann Heman. You will receive a reminder letter in the mail two months in advance. If you don't receive a letter, please call our office to schedule the follow-up appointment.   ICM---Laurie Short RN will contact you in regards to ICM clinic.  Any Other Special Instructions Will Be Listed Below (If Applicable).     If you need a refill on your cardiac medications before your next appointment, please call your pharmacy.

## 2016-07-06 NOTE — Telephone Encounter (Signed)
Routing to dr john, please advise, thanks 

## 2016-07-07 NOTE — Telephone Encounter (Signed)
faxed

## 2016-07-07 NOTE — Telephone Encounter (Signed)
Done hardcopy to [name]  

## 2016-07-31 ENCOUNTER — Other Ambulatory Visit: Payer: Self-pay | Admitting: Pharmacist

## 2016-07-31 NOTE — Patient Outreach (Signed)
Receive a call back from [name]. Schedule with Ms. Nilsen to complete this medication review on Friday, 08/07/16 at [place], PharmD, [facility] [phone]

## 2016-07-31 NOTE — Patient Outreach (Signed)
Outreach call to Carolyn [name] to complete medication review for [facility] quality metric. Left a HIPAA compliant message on the patient's voicemail. If have not heard from patient by next week, will give her another call at that time.  Harlow Asa, PharmD, [place] [facility] [phone]

## 2016-08-07 ENCOUNTER — Other Ambulatory Visit: Payer: Self-pay | Admitting: Pharmacist
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
## 2016-09-07 ENCOUNTER — Other Ambulatory Visit: Payer: Self-pay | Admitting: Internal Medicine

## 2016-09-08 NOTE — Telephone Encounter (Signed)
Faxed

## 2016-09-08 NOTE — Telephone Encounter (Signed)
Done hardcopy to [name]  

## 2016-09-15 ENCOUNTER — Other Ambulatory Visit (INDEPENDENT_AMBULATORY_CARE_PROVIDER_SITE_OTHER): Payer: Medicare PPO

## 2016-09-15 ENCOUNTER — Encounter: Payer: Self-pay | Admitting: Internal Medicine

## 2016-09-15 ENCOUNTER — Ambulatory Visit (INDEPENDENT_AMBULATORY_CARE_PROVIDER_SITE_OTHER): Payer: Medicare PPO | Admitting: Internal Medicine

## 2016-09-15 VITALS — BP 144/82 | HR 74 | Ht 60.0 in | Wt 162.0 lb

## 2016-09-15 DIAGNOSIS — Z794 Long term (current) use of insulin: Secondary | ICD-10-CM | POA: Diagnosis not present

## 2016-09-15 DIAGNOSIS — I10 Essential (primary) hypertension: Secondary | ICD-10-CM

## 2016-09-15 DIAGNOSIS — E119 Type 2 diabetes mellitus without complications: Secondary | ICD-10-CM

## 2016-09-15 DIAGNOSIS — Z0001 Encounter for general adult medical examination with abnormal findings: Secondary | ICD-10-CM

## 2016-09-15 DIAGNOSIS — E785 Hyperlipidemia, unspecified: Secondary | ICD-10-CM | POA: Diagnosis not present

## 2016-09-15 LAB — HEPATIC FUNCTION PANEL
ALT: 10 U/L (ref 0–35)
AST: 13 U/L (ref 0–37)
Albumin: 4.2 g/dL (ref 3.5–5.2)
Alkaline Phosphatase: 55 U/L (ref 39–117)
BILIRUBIN TOTAL: 0.5 mg/dL (ref 0.2–1.2)
Bilirubin, Direct: 0.2 mg/dL (ref 0.0–0.3)
Total Protein: 7.6 g/dL (ref 6.0–8.3)

## 2016-09-15 LAB — HEMOGLOBIN A1C: HEMOGLOBIN A1C: 7 % — AB (ref 4.6–6.5)

## 2016-09-15 LAB — BASIC METABOLIC PANEL
BUN: 9 mg/dL (ref 6–23)
CALCIUM: 9.6 mg/dL (ref 8.4–10.5)
CO2: 28 meq/L (ref 19–32)
Chloride: 104 mEq/L (ref 96–112)
Creatinine, Ser: 0.84 mg/dL (ref 0.40–1.20)
GFR: 70.28 mL/min (ref >60.00)
GLUCOSE: 130 mg/dL — AB (ref 70–99)
POTASSIUM: 3.8 meq/L (ref 3.5–5.1)
SODIUM: 140 meq/L (ref 135–145)

## 2016-09-15 LAB — LIPID PANEL
CHOLESTEROL: 140 mg/dL (ref 0–200)
HDL: 41.2 mg/dL (ref >39.00)
LDL Cholesterol: 73 mg/dL (ref 0–99)
NONHDL: 99.26
Total CHOL/HDL Ratio: 3
Triglycerides: 129 mg/dL (ref 0.0–149.0)
VLDL: 25.8 mg/dL (ref 0.0–40.0)
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
## 2016-09-15 NOTE — Assessment & Plan Note (Signed)
stable overall by history and exam, recent data reviewed with pt, and pt to continue medical treatment as before,  to f/u any worsening symptoms or concerns  Lab Results  Component Value Date   LDLCALC 76 03/10/2016   For f/u lab today

## 2016-09-15 NOTE — Patient Instructions (Signed)

## 2016-09-15 NOTE — Assessment & Plan Note (Signed)
stable overall by history and exam, recent data reviewed with pt, and pt to continue medical treatment as before,  to f/u any worsening symptoms or concerns Lab Results  Component Value Date   HGBA1C 7.1 (H) 03/10/2016

## 2016-09-15 NOTE — Assessment & Plan Note (Signed)
stable overall by history and exam, recent data reviewed with pt, and pt to continue medical treatment as before,  to f/u any worsening symptoms or concerns BP Readings from Last 3 Encounters:  09/15/16 (!) 144/82  07/06/16 (!) 142/90  06/14/16 101/67

## 2016-09-16 ENCOUNTER — Encounter: Payer: Self-pay | Admitting: Internal Medicine

## 2016-09-20 ENCOUNTER — Other Ambulatory Visit: Payer: Self-pay | Admitting: Internal Medicine

## 2016-09-25 ENCOUNTER — Telehealth: Payer: Self-pay

## 2016-09-25 NOTE — Telephone Encounter (Signed)
Referred to ICM clinic by Dr Rayann Heman.  Attempted ICM intro call and left message for return call.  Remote transmission scheduled in [place] bor 10/05/2016.

## 2016-10-05 ENCOUNTER — Ambulatory Visit (INDEPENDENT_AMBULATORY_CARE_PROVIDER_SITE_OTHER): Payer: Medicare PPO | Admitting: *Deleted

## 2016-10-05 DIAGNOSIS — I428 Other cardiomyopathies: Secondary | ICD-10-CM

## 2016-10-06 NOTE — Progress Notes (Signed)
Remote ICD transmission.   

## 2016-10-08 ENCOUNTER — Other Ambulatory Visit: Payer: Self-pay | Admitting: Internal Medicine

## 2016-10-08 NOTE — Telephone Encounter (Signed)
Done hardcopy to [name]  

## 2016-10-08 NOTE — Telephone Encounter (Signed)
Faxed

## 2016-10-09 ENCOUNTER — Encounter: Payer: Self-pay | Admitting: Cardiology

## 2016-10-16 ENCOUNTER — Telehealth: Payer: Self-pay | Admitting: Internal Medicine

## 2016-10-16 NOTE — Telephone Encounter (Signed)
New ID card ordered for patient. Patient should receive it within the next 10-12 days. Patient aware.

## 2016-10-16 NOTE — Telephone Encounter (Signed)
Patient calling, states that she needs a copy of the card that will exempt her from walking through the metal detectors. Patient states that she has misplaced the card and will need it, as she has to the [employer].

## 2016-10-19 NOTE — Telephone Encounter (Signed)
Attempted patient call for ICM intro and left message to return call.

## 2016-10-23 ENCOUNTER — Telehealth (INDEPENDENT_AMBULATORY_CARE_PROVIDER_SITE_OTHER): Payer: Self-pay

## 2016-10-23 NOTE — Telephone Encounter (Signed)
Pt left vm requesting another injection. States pain is across low back. Last seen in April for MBB. Please advise

## 2016-10-23 NOTE — Telephone Encounter (Signed)
If last helped a lot we can repeat MBB

## 2016-10-23 NOTE — Telephone Encounter (Signed)
Patient left voice mail message she was returning my call.

## 2016-10-23 NOTE — Telephone Encounter (Signed)
Faxed [employer] auth form with last 3 office notes from Dr. Ernestina Patches to [phone]. Pt is scheduled for 11/04/16.

## 2016-10-23 NOTE — Telephone Encounter (Signed)
Call to patient and ICM intro given and she agreed to monthly ICM follow up. 1st ICM remote transmission scheduled for 8/23/20108.

## 2016-10-23 NOTE — Telephone Encounter (Signed)
Spoke with patient and before I could provide ICM intro the phone disconnected.  Attempted redial but says network is not available.

## 2016-10-23 NOTE — Telephone Encounter (Signed)
Returned patient call and left voice mail message.

## 2016-10-29 ENCOUNTER — Telehealth: Payer: Self-pay

## 2016-10-29 ENCOUNTER — Ambulatory Visit (INDEPENDENT_AMBULATORY_CARE_PROVIDER_SITE_OTHER): Payer: Medicare PPO

## 2016-10-29 DIAGNOSIS — Z95810 Presence of automatic (implantable) cardiac defibrillator: Secondary | ICD-10-CM | POA: Diagnosis not present

## 2016-10-29 DIAGNOSIS — I428 Other cardiomyopathies: Secondary | ICD-10-CM

## 2016-10-29 NOTE — Progress Notes (Signed)
EPIC Encounter for ICM Monitoring  Patient Name: Carolyn [name] is a 76 y.o. female Date: 10/29/2016 Primary Care Physican: Biagio Borg, MD Primary Cardiologist: Julianne Handler Electrophysiologist: Allred Dry Weight:  unknown Bi-V Pacing:   >99%%       1st ICM remote.  Attempted call to patient and unable to reach.  Left detailed message regarding transmission.  Transmission reviewed.    Thoracic impedance normal but was abnormal suggesting fluid accumulation 10/18/2016 to 10/25/2016 (7days).  Prescribed dosage: Furosemide 20 mg 1 Tablet daily.    Labs: 09/15/2016 Creatinine 0.84, BUN 9,   Potassium 3.8, Sodium 140 03/10/2016 Creatinine 0.76, BUN 12, Potassium 3.6, Sodium 138  Recommendations: Left voice mail with ICM number and encouraged to call for fluid symptoms.  Follow-up plan: ICM clinic phone appointment on 11/30/2016.    Copy of ICM check sent to Dr. Rayann Heman.   3 month ICM trend: 10/29/2016   1 Year ICM trend:      Rosalene Billings, RN 10/29/2016 10:04 AM

## 2016-10-29 NOTE — Telephone Encounter (Signed)
Remote ICM transmission received.  Attempted patient call and left detailed message regarding transmission next ICM scheduled for 11/30/2016.  Advised to return call for any fluid symptoms or questions.

## 2016-11-01 ENCOUNTER — Other Ambulatory Visit: Payer: Self-pay | Admitting: Internal Medicine

## 2016-11-03 NOTE — Telephone Encounter (Signed)
Received auth for bil [id number] and [id number]. #[id number]. eff 10/23/16-12/07/16.

## 2016-11-04 ENCOUNTER — Ambulatory Visit (INDEPENDENT_AMBULATORY_CARE_PROVIDER_SITE_OTHER): Payer: Medicare PPO

## 2016-11-04 ENCOUNTER — Encounter (INDEPENDENT_AMBULATORY_CARE_PROVIDER_SITE_OTHER): Payer: Self-pay | Admitting: Physical Medicine and Rehabilitation

## 2016-11-04 ENCOUNTER — Ambulatory Visit (INDEPENDENT_AMBULATORY_CARE_PROVIDER_SITE_OTHER): Payer: Medicare PPO | Admitting: Physical Medicine and Rehabilitation

## 2016-11-04 VITALS — BP 130/69 | HR 76 | Temp 97.6°F

## 2016-11-04 DIAGNOSIS — M545 Low back pain: Secondary | ICD-10-CM

## 2016-11-04 DIAGNOSIS — M47816 Spondylosis without myelopathy or radiculopathy, lumbar region: Secondary | ICD-10-CM | POA: Diagnosis not present

## 2016-11-04 DIAGNOSIS — G8929 Other chronic pain: Secondary | ICD-10-CM

## 2016-11-04 MED ORDER — LIDOCAINE HCL (PF) 1 % IJ SOLN
2.0000 mL | Freq: Once | INTRAMUSCULAR | Status: AC
  Administered 2016-11-04: 2 mL

## 2016-11-04 MED ORDER — BUPIVACAINE HCL 0.5 % IJ SOLN
3.0000 mL | Freq: Once | INTRAMUSCULAR | Status: AC
  Administered 2016-11-04: 3 mL

## 2016-11-04 NOTE — Progress Notes (Deleted)
Pain across lower back and into both hips. Right=left. Down both legs to feet. Constant. Worse walking and standing

## 2016-11-04 NOTE — Patient Instructions (Signed)

## 2016-11-06 ENCOUNTER — Other Ambulatory Visit: Payer: Self-pay | Admitting: Internal Medicine

## 2016-11-06 LAB — CUP PACEART REMOTE DEVICE CHECK
Battery Remaining Longevity: 64 mo
Battery Remaining Percentage: 75 %
Battery Voltage: 3.01 V
Brady Statistic AP VP Percent: 1.9 %
Brady Statistic RA Percent Paced: 1.9 %
Date Time Interrogation Session: 20180730060020
HIGH POWER IMPEDANCE MEASURED VALUE: 77 Ohm
HighPow Impedance: 77 Ohm
Implantable Lead Implant Date: 20100930
Implantable Lead Implant Date: 20100930
Implantable Lead Location: 753858
Implantable Lead Location: 753859
Implantable Lead Location: 753860
Lead Channel Impedance Value: 510 Ohm
Lead Channel Impedance Value: 660 Ohm
Lead Channel Pacing Threshold Amplitude: 0.75 V
Lead Channel Pacing Threshold Pulse Width: 0.5 ms
Lead Channel Setting Pacing Amplitude: 2 V
Lead Channel Setting Pacing Pulse Width: 0.5 ms
MDC IDC LEAD IMPLANT DT: 20100930
MDC IDC MSMT LEADCHNL LV PACING THRESHOLD AMPLITUDE: 1 V
MDC IDC MSMT LEADCHNL LV PACING THRESHOLD PULSEWIDTH: 0.5 ms
MDC IDC MSMT LEADCHNL RA IMPEDANCE VALUE: 350 Ohm
MDC IDC MSMT LEADCHNL RA PACING THRESHOLD AMPLITUDE: 0.75 V
MDC IDC MSMT LEADCHNL RA PACING THRESHOLD PULSEWIDTH: 0.5 ms
MDC IDC MSMT LEADCHNL RA SENSING INTR AMPL: 1.9 mV
MDC IDC MSMT LEADCHNL RV SENSING INTR AMPL: 12 mV
MDC IDC PG IMPLANT DT: 20161215
MDC IDC PG SERIAL: 7247188
MDC IDC SET LEADCHNL LV PACING AMPLITUDE: 2 V
MDC IDC SET LEADCHNL LV PACING PULSEWIDTH: 0.5 ms
MDC IDC SET LEADCHNL RV PACING AMPLITUDE: 2.5 V
MDC IDC SET LEADCHNL RV SENSING SENSITIVITY: 0.5 mV
MDC IDC STAT BRADY AP VS PERCENT: 1 %
MDC IDC STAT BRADY AS VP PERCENT: 98 %
MDC IDC STAT BRADY AS VS PERCENT: 1 %

## 2016-11-06 NOTE — Telephone Encounter (Signed)
Done hardcopy to [name]  

## 2016-11-06 NOTE — Telephone Encounter (Signed)
Faxed

## 2016-11-06 NOTE — Procedures (Signed)
Carolyn [name] is a 76 year old female with chronic pain syndrome and chronic low back pain with MRI evidence of facet arthropathy without significant stenosis. She has no radicular complaints but has history of polyneuropathy of the lower limbs. She does get minor tingling numbness in the feet. She has mostly low back pain worse with standing and movement. She has no hip or groin pain. We've seen her for quite some time and those notes can all be reviewed. She was getting significant relief with facet joint block for several months and then most recently they were only lasting for a day. She's had physical therapy. She used to be a fairly chronic opioid user but is not using any of that at this point. She did have one set of diagnostic medial branch blocks which did give her more than 50% relief of her lower back pain.  I will repeat today for a second diagnostic medial branch block of the lower joints at L4-5 and L5-S1. We will give her a pain diary. She has lost to follow-up at times.  Lumbar Diagnostic Facet Joint Nerve Block with Fluoroscopic Guidance   Patient: Carolyn [name]      Date of Birth: Jun 30, 1940 MRN: [id number] PCP: Biagio Borg, MD      Visit Date: 11/04/2016   Universal Protocol:    Date/Time: 08/31/186:02 AM  Consent Given By: the patient  Position: PRONE  Additional Comments: Vital signs were monitored before and after the procedure. Patient was prepped and draped in the usual sterile fashion. The correct patient, procedure, and site was verified.   Injection Procedure Details:  Procedure Site One Meds Administered:  Meds ordered this encounter  Medications  . lidocaine (PF) (XYLOCAINE) 1 % injection 2 mL  . bupivacaine (MARCAINE) 0.5 % (with pres) injection 3 mL     Laterality: Bilateral  Location/Site: Bilateral L3 and L4 medial branches and L5 dorsal rami L4-L5 L5-S1  Needle size: 22 G  Needle type: spinal needle  Needle Placement: Oblique  pedical  Findings:  -Contrast Used: 1 mL iohexol 180 mg iodine/mL   -Comments: It was excellent flow of contrast along the articular pillars without intravascular flow.  Procedure Details: The fluoroscope beam is vertically oriented in AP and then obliqued 15 to 20 degrees to the ipsilateral side of the desired nerve to achieve the "Scotty dog" appearance.  The skin over the target area of the junction of the superior articulating process and the transverse process (sacral ala if blocking the L5 dorsal rami) was locally anesthetized with a 1 ml volume of 1% Lidocaine without Epinephrine.  The spinal needle was inserted and advanced in a trajectory view down to the target.   After contact with periosteum and negative aspirate for blood and CSF, correct placement without intravascular or epidural spread was confirmed by injecting 0.5 ml. of Isovue-250.  A spot radiograph was obtained of this image.    Next, a 0.5 ml. volume of the injectate described above was injected. The needle was then redirected to the other facet joint nerves mentioned above if needed.  Prior to the procedure, the patient was given a Pain Diary which was completed for baseline measurements.  After the procedure, the patient rated their pain every 30 minutes and will continue rating at this frequency for a total of 5 hours.  The patient has been asked to complete the Diary and return to [place] by mail, fax or hand delivered as soon as possible.   Additional Comments:  The patient tolerated the procedure well Dressing: Band-Aid    Post-procedure details: Patient was observed during the procedure. Post-procedure instructions were reviewed.  Patient left the clinic in stable condition.

## 2016-11-23 ENCOUNTER — Other Ambulatory Visit: Payer: Self-pay | Admitting: Cardiovascular Disease

## 2016-11-30 ENCOUNTER — Ambulatory Visit (INDEPENDENT_AMBULATORY_CARE_PROVIDER_SITE_OTHER): Payer: Medicare PPO

## 2016-11-30 DIAGNOSIS — Z95810 Presence of automatic (implantable) cardiac defibrillator: Secondary | ICD-10-CM

## 2016-11-30 DIAGNOSIS — I428 Other cardiomyopathies: Secondary | ICD-10-CM

## 2016-11-30 NOTE — Progress Notes (Signed)
EPIC Encounter for ICM Monitoring  Patient Name: Carolyn [name] is a 76 y.o. female Date: 11/30/2016 Primary Care Physican: Biagio Borg, MD Primary Cardiologist: Julianne Handler Electrophysiologist: Allred Dry Weight:  unknown Bi-V Pacing:   >99%%      Attempted call to patient and unable to reach.   Transmission reviewed.    Thoracic impedance normal.  Prescribed dosage: Furosemide 20 mg 1 Tablet daily.    Labs: 09/15/2016 Creatinine 0.84, BUN 9,   Potassium 3.8, Sodium 140 03/10/2016 Creatinine 0.76, BUN 12, Potassium 3.6, Sodium 138  Recommendations: NONE - Unable to reach patient  Follow-up plan: ICM clinic phone appointment on 01/04/2017.  Office appointment scheduled 12/10/2016 with Ellen Henri, [place].  Copy of ICM check sent to Dr. Rayann Heman.   3 month ICM trend: 11/30/2016   1 Year ICM trend:      Rosalene Billings, RN 11/30/2016 9:24 AM

## 2016-12-01 ENCOUNTER — Telehealth: Payer: Self-pay

## 2016-12-01 NOTE — Telephone Encounter (Signed)
Remote ICM transmission received.  Attempted call to patient and no answer   

## 2016-12-03 ENCOUNTER — Ambulatory Visit (INDEPENDENT_AMBULATORY_CARE_PROVIDER_SITE_OTHER): Payer: Medicare PPO | Admitting: Physical Medicine and Rehabilitation

## 2016-12-03 ENCOUNTER — Encounter (INDEPENDENT_AMBULATORY_CARE_PROVIDER_SITE_OTHER): Payer: Self-pay | Admitting: Physical Medicine and Rehabilitation

## 2016-12-03 ENCOUNTER — Ambulatory Visit (INDEPENDENT_AMBULATORY_CARE_PROVIDER_SITE_OTHER): Payer: Self-pay

## 2016-12-03 VITALS — BP 154/79 | HR 70

## 2016-12-03 DIAGNOSIS — M47816 Spondylosis without myelopathy or radiculopathy, lumbar region: Secondary | ICD-10-CM | POA: Diagnosis not present

## 2016-12-03 MED ORDER — LIDOCAINE HCL (PF) 1 % IJ SOLN: 2.0000 mL | Freq: Once | INTRAMUSCULAR | Status: DC

## 2016-12-03 MED ORDER — METHYLPREDNISOLONE ACETATE 80 MG/ML IJ SUSP
80.0000 mg | Freq: Once | INTRAMUSCULAR | Status: AC
  Administered 2016-12-03: 80 mg

## 2016-12-03 NOTE — Progress Notes (Deleted)
Patient is here for planned RFA. Left side is worse.

## 2016-12-03 NOTE — Patient Instructions (Signed)

## 2016-12-07 NOTE — Procedures (Signed)
Carolyn [name] is a 76 year old female with chronic many year history of severe low back pain. She has had trials of multiple narcotic medications and muscle relaxers in therapy. She has recently undergone physical therapy with no relief. She has had back pain for many years with no leg pain. She gets some tingling in the feet bilaterally. MRI evidence of stenosis but facet arthropathy. She has completed double diagnostic medial branch blocks of the L4-5 and L5-S1 facet joints with good relief on pain diary and this is been documented. We are going to complete today radiofrequency ablation of the left L4-5 and L5-S1 facet joints. If she does well would look at the right side.  Lumbar Facet Joint Nerve Denervation  Patient: Carolyn [name]      Date of Birth: [birth year] MRN: [id number] PCP: Biagio Borg, MD      Visit Date: 12/03/2016   Universal Protocol:    Date/Time: 12/08/1810:02 AM  Consent Given By: the patient  Position: PRONE  Additional Comments: Vital signs were monitored before and after the procedure. Patient was prepped and draped in the usual sterile fashion. The correct patient, procedure, and site was verified.   Injection Procedure Details:  Procedure Site One Meds Administered:  Meds ordered this encounter  Medications  . lidocaine (PF) (XYLOCAINE) 1 % injection 2 mL  . methylPREDNISolone acetate (DEPO-MEDROL) injection 80 mg     Laterality: Left  Location/Site: Left L3 and L4 medial branches and L5 dorsal rami L4-L5 L5-S1  Needle size: 18 G  Needle type: Radiofrequency cannula  Needle Placement: Along juncture of superior articular process and transverse pocess  Findings:  -Comments:  Procedure Details: For each desired target nerve, the corresponding transverse process (sacral ala for the L5 dorsal rami) was identified and the fluoroscope was positioned to square off the endplates of the corresponding vertebral body to achieve a true AP midline view.  The  beam was then obliqued 15 to 20 degrees and caudally tilted 15 to 20 degrees to line up a trajectory along the target nerves. The skin over the target of the junction of superior articulating process and transverse process (sacral ala for the L5 dorsal rami) was infiltrated with 20ml of 1% Lidocaine without Epinephrine.  The 18 gauge 33mm active tip outer cannula was advanced in trajectory view to the target.  This procedure was repeated for each target nerve.  Then, for all levels, the outer cannula placement was fine-tuned and the position was then confirmed with bi-planar imaging.    Test stimulation was done both at sensory and motor levels to ensure there was no radicular stimulation. The target tissues were then infiltrated with 1 ml of 1% Lidocaine without Epinephrine. Subsequently, a percutaneous neurotomy was carried out for 60 seconds at 80 degrees Celsius. The procedure was repeated with the cannula rotated 90 degrees, for duration of 60 seconds, one additional time at each level for a total of two lesions per level.  After the completion of the two lesions, 1 ml of injectate was delivered. It was then repeated for each facet joint nerve mentioned above. Appropriate radiographs were obtained to verify the probe placement during the neurotomy.   Additional Comments:  The patient tolerated the procedure well Dressing: Band-Aid    Post-procedure details: Patient was observed during the procedure. Post-procedure instructions were reviewed.  Patient left the clinic in stable condition.

## 2016-12-10 ENCOUNTER — Ambulatory Visit: Payer: Medicare PPO | Admitting: Cardiology

## 2016-12-17 ENCOUNTER — Encounter (INDEPENDENT_AMBULATORY_CARE_PROVIDER_SITE_OTHER): Payer: Self-pay | Admitting: Physical Medicine and Rehabilitation

## 2016-12-17 ENCOUNTER — Ambulatory Visit (INDEPENDENT_AMBULATORY_CARE_PROVIDER_SITE_OTHER): Payer: Medicare PPO | Admitting: Physical Medicine and Rehabilitation

## 2016-12-17 VITALS — BP 119/61 | HR 66 | Temp 97.9°F

## 2016-12-17 DIAGNOSIS — M545 Low back pain: Secondary | ICD-10-CM

## 2016-12-17 DIAGNOSIS — G894 Chronic pain syndrome: Secondary | ICD-10-CM

## 2016-12-17 DIAGNOSIS — M47816 Spondylosis without myelopathy or radiculopathy, lumbar region: Secondary | ICD-10-CM

## 2016-12-17 DIAGNOSIS — G8929 Other chronic pain: Secondary | ICD-10-CM
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
## 2016-12-22 ENCOUNTER — Ambulatory Visit (INDEPENDENT_AMBULATORY_CARE_PROVIDER_SITE_OTHER): Payer: Medicare PPO | Admitting: Cardiology

## 2016-12-22 ENCOUNTER — Encounter: Payer: Self-pay | Admitting: Cardiology

## 2016-12-22 VITALS — BP 104/60 | HR 66 | Resp 16 | Ht 60.0 in | Wt 164.1 lb

## 2016-12-22 DIAGNOSIS — I5032 Chronic diastolic (congestive) heart failure: Secondary | ICD-10-CM | POA: Diagnosis not present
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
## 2016-12-22 NOTE — Patient Instructions (Signed)
Your physician recommends that you continue on your current medications as directed. Please refer to the Current Medication list given to you today.  Your physician wants you to follow-up in: [place] will receive a reminder letter in the mail two months in advance. If you don't receive a letter, please call our office to schedule the follow-up appointment.

## 2016-12-23 ENCOUNTER — Encounter (INDEPENDENT_AMBULATORY_CARE_PROVIDER_SITE_OTHER): Payer: Medicare PPO | Admitting: Physical Medicine and Rehabilitation

## 2016-12-24 ENCOUNTER — Encounter (INDEPENDENT_AMBULATORY_CARE_PROVIDER_SITE_OTHER): Payer: Self-pay | Admitting: Physical Medicine and Rehabilitation

## 2016-12-29 ENCOUNTER — Telehealth: Payer: Self-pay | Admitting: Internal Medicine

## 2016-12-29 DIAGNOSIS — L989 Disorder of the skin and subcutaneous tissue, unspecified: Secondary | ICD-10-CM

## 2016-12-29 NOTE — Telephone Encounter (Signed)
Pt called and would like a referral to a dermatologists for wart removal, she has about 15 warts on her right legs and on eon her eye that is bothering her. Please advise

## 2016-12-30 ENCOUNTER — Ambulatory Visit (INDEPENDENT_AMBULATORY_CARE_PROVIDER_SITE_OTHER): Payer: Medicare PPO

## 2016-12-30 ENCOUNTER — Ambulatory Visit (INDEPENDENT_AMBULATORY_CARE_PROVIDER_SITE_OTHER): Payer: Medicare PPO | Admitting: Physical Medicine and Rehabilitation

## 2016-12-30 ENCOUNTER — Encounter (INDEPENDENT_AMBULATORY_CARE_PROVIDER_SITE_OTHER): Payer: Medicare PPO | Admitting: Physical Medicine and Rehabilitation

## 2016-12-30 ENCOUNTER — Encounter (INDEPENDENT_AMBULATORY_CARE_PROVIDER_SITE_OTHER): Payer: Self-pay | Admitting: Physical Medicine and Rehabilitation

## 2016-12-30 VITALS — BP 144/79 | HR 74 | Temp 98.1°F

## 2016-12-30 DIAGNOSIS — M545 Low back pain: Secondary | ICD-10-CM

## 2016-12-30 DIAGNOSIS — M47816 Spondylosis without myelopathy or radiculopathy, lumbar region: Secondary | ICD-10-CM

## 2016-12-30 DIAGNOSIS — G8929 Other chronic pain: Secondary | ICD-10-CM

## 2016-12-30 MED ORDER — LIDOCAINE HCL (PF) 1 % IJ SOLN
2.0000 mL | Freq: Once | INTRAMUSCULAR | Status: AC
  Administered 2016-12-30: 2 mL

## 2016-12-30 MED ORDER — METHYLPREDNISOLONE ACETATE 80 MG/ML IJ SUSP
80.0000 mg | Freq: Once | INTRAMUSCULAR | Status: AC
  Administered 2016-12-30: 80 mg

## 2016-12-30 NOTE — Procedures (Signed)
[name] is a 76 year old female with chronic pain syndrome and chronic axial low back consistent with facet mediated low back pain with double block paradigm medial branch blocks as well as physical therapy and medication management.  Recently completed a left-sided radiofrequency ablation of the L4-5 facet joints.  Pretty well with that so we are going to complete the right side today.  Lumbar Facet Joint Nerve Denervation  Patient: Carolyn [name]      Date of Birth: 11-24-1940 MRN: [id number] PCP: Biagio Borg, MD      Visit Date: 12/30/2016   Universal Protocol:    Date/Time: 10/24/189:38 AM  Consent Given By: the patient  Position: PRONE  Additional Comments: Vital signs were monitored before and after the procedure. Patient was prepped and draped in the usual sterile fashion. The correct patient, procedure, and site was verified.   Injection Procedure Details:  Procedure Site One Meds Administered:  Meds ordered this encounter  Medications  . lidocaine (PF) (XYLOCAINE) 1 % injection 2 mL  . methylPREDNISolone acetate (DEPO-MEDROL) injection 80 mg     Laterality: Right  Location/Site: Right L3, L4 medial branches and right L5 dorsal rami L4-L5 L5-S1  Needle size: 18 G  Needle type: Radiofrequency cannula  Needle Placement: Along juncture of superior articular process and transverse pocess  Findings:  -Comments:  Procedure Details: For each desired target nerve, the corresponding transverse process (sacral ala for the L5 dorsal rami) was identified and the fluoroscope was positioned to square off the endplates of the corresponding vertebral body to achieve a true AP midline view.  The beam was then obliqued 15 to 20 degrees and caudally tilted 15 to 20 degrees to line up a trajectory along the target nerves. The skin over the target of the junction of superior articulating process and transverse process (sacral ala for the L5 dorsal rami) was infiltrated with 70ml of 1%  Lidocaine without Epinephrine.  The 18 gauge 62mm active tip outer cannula was advanced in trajectory view to the target.  This procedure was repeated for each target nerve.  Then, for all levels, the outer cannula placement was fine-tuned and the position was then confirmed with bi-planar imaging.    Test stimulation was done both at sensory and motor levels to ensure there was no radicular stimulation. The target tissues were then infiltrated with 1 ml of 1% Lidocaine without Epinephrine. Subsequently, a percutaneous neurotomy was carried out for 60 seconds at 80 degrees Celsius. The procedure was repeated with the cannula rotated 90 degrees, for duration of 60 seconds, one additional time at each level for a total of two lesions per level.  After the completion of the two lesions, 1 ml of injectate was delivered. It was then repeated for each facet joint nerve mentioned above. Appropriate radiographs were obtained to verify the probe placement during the neurotomy.   Additional Comments:  The patient tolerated the procedure well Dressing: Band-Aid    Post-procedure details: Patient was observed during the procedure. Post-procedure instructions were reviewed.  Patient left the clinic in stable condition.

## 2016-12-30 NOTE — Patient Instructions (Signed)

## 2016-12-30 NOTE — Progress Notes (Deleted)
Here for right sided RFA. No changes since last seen. Can't tell if the left side is still hurting due to the pain on the right, but she thinks it is doing better.

## 2017-01-01 NOTE — Addendum Note (Signed)
Addended by: Biagio Borg on: 01/01/2017 04:19 PM   Modules accepted: Orders, SmartSet

## 2017-01-01 NOTE — Telephone Encounter (Signed)
I think she may mean skin tags, but ok for derm referral, I will do

## 2017-01-01 NOTE — Telephone Encounter (Signed)
Called pt, LVM informing her referral has been placed

## 2017-01-04 ENCOUNTER — Ambulatory Visit (INDEPENDENT_AMBULATORY_CARE_PROVIDER_SITE_OTHER): Payer: Medicare PPO | Admitting: *Deleted

## 2017-01-04 DIAGNOSIS — Z95810 Presence of automatic (implantable) cardiac defibrillator: Secondary | ICD-10-CM | POA: Diagnosis not present

## 2017-01-04 DIAGNOSIS — I5032 Chronic diastolic (congestive) heart failure: Secondary | ICD-10-CM

## 2017-01-05 ENCOUNTER — Other Ambulatory Visit: Payer: Self-pay | Admitting: Internal Medicine

## 2017-01-05 NOTE — Progress Notes (Signed)
Remote ICD transmission.   

## 2017-01-05 NOTE — Progress Notes (Signed)
EPIC Encounter for ICM Monitoring  Patient Name: Carolyn [name] is a 76 y.o. female Date: 01/05/2017 Primary Care Physican: Biagio Borg, MD Primary Cardiologist: [facility] Electrophysiologist: Allred Dry Weight:164 lbs Bi-V Pacing: >99%%          Heart Failure questions reviewed, pt asymptomatic.   Thoracic impedance normal but was abnormal suggesting fluid accumulation from 12/18/2016 to 12/29/2016.  Prescribed dosage: Furosemide 20 mg 1 tablet daily.   Labs: 09/15/2016 Creatinine 0.84, BUN 9, Potassium 3.8, Sodium 140 03/10/2016 Creatinine 0.76, BUN 12, Potassium 3.6, Sodium 138  Recommendations: No changes.   Encouraged to call for fluid symptoms.  Follow-up plan: ICM clinic phone appointment on 02/04/2017.    Copy of ICM check sent to Dr. Rayann Heman.   3 month ICM trend: 01/04/2017   1 Year ICM trend:      Rosalene Billings, RN 01/05/2017 11:40 AM

## 2017-01-06 NOTE — Telephone Encounter (Signed)
Done hardcopy to [name]  

## 2017-01-06 NOTE — Telephone Encounter (Signed)
Faxed

## 2017-01-07 ENCOUNTER — Other Ambulatory Visit: Payer: Self-pay | Admitting: Internal Medicine

## 2017-01-07 NOTE — Telephone Encounter (Signed)
Xanax and tramadol rx too early due to just being done yesterday

## 2017-01-08 LAB — CUP PACEART REMOTE DEVICE CHECK
Battery Voltage: 3.01 V
Brady Statistic AP VP Percent: 3.4 %
Brady Statistic AP VS Percent: 1 %
Brady Statistic AS VP Percent: 97 %
Date Time Interrogation Session: 20181029060016
HighPow Impedance: 86 Ohm
HighPow Impedance: 86 Ohm
Implantable Lead Implant Date: 20100930
Implantable Lead Location: 753859
Lead Channel Impedance Value: 380 Ohm
Lead Channel Pacing Threshold Amplitude: 0.75 V
Lead Channel Pacing Threshold Pulse Width: 0.5 ms
Lead Channel Sensing Intrinsic Amplitude: 12 mV
Lead Channel Setting Pacing Amplitude: 2 V
Lead Channel Setting Pacing Pulse Width: 0.5 ms
Lead Channel Setting Pacing Pulse Width: 0.5 ms
MDC IDC LEAD IMPLANT DT: 20100930
MDC IDC LEAD IMPLANT DT: 20100930
MDC IDC LEAD LOCATION: 753858
MDC IDC LEAD LOCATION: 753860
MDC IDC MSMT BATTERY REMAINING LONGEVITY: 62 mo
MDC IDC MSMT BATTERY REMAINING PERCENTAGE: 72 %
MDC IDC MSMT LEADCHNL LV IMPEDANCE VALUE: 530 Ohm
MDC IDC MSMT LEADCHNL LV PACING THRESHOLD AMPLITUDE: 1 V
MDC IDC MSMT LEADCHNL LV PACING THRESHOLD PULSEWIDTH: 0.5 ms
MDC IDC MSMT LEADCHNL RA PACING THRESHOLD PULSEWIDTH: 0.5 ms
MDC IDC MSMT LEADCHNL RA SENSING INTR AMPL: 2.6 mV
MDC IDC MSMT LEADCHNL RV IMPEDANCE VALUE: 790 Ohm
MDC IDC MSMT LEADCHNL RV PACING THRESHOLD AMPLITUDE: 0.75 V
MDC IDC PG IMPLANT DT: 20161215
MDC IDC SET LEADCHNL LV PACING AMPLITUDE: 2 V
MDC IDC SET LEADCHNL RV PACING AMPLITUDE: 2.5 V
MDC IDC SET LEADCHNL RV SENSING SENSITIVITY: 0.5 mV
MDC IDC STAT BRADY AS VS PERCENT: 1 %
MDC IDC STAT BRADY RA PERCENT PACED: 3.3 %
Pulse Gen Serial Number: 7247188

## 2017-01-12 ENCOUNTER — Encounter: Payer: Self-pay | Admitting: Cardiology

## 2017-01-25 ENCOUNTER — Telehealth: Payer: Self-pay | Admitting: Internal Medicine

## 2017-01-25 NOTE — Telephone Encounter (Signed)
Walk In Pt Form-[employer] paperwork left. Placed in [place].

## 2017-01-27 ENCOUNTER — Telehealth: Payer: Self-pay | Admitting: Internal Medicine

## 2017-01-27 NOTE — Telephone Encounter (Signed)
Patient aware [employer] papers signed. She asked if these papers could be mailed to   [facility]  [address] [place] [address] [place]

## 2017-02-02 ENCOUNTER — Ambulatory Visit (INDEPENDENT_AMBULATORY_CARE_PROVIDER_SITE_OTHER): Payer: Medicare PPO | Admitting: Physical Medicine and Rehabilitation

## 2017-02-02 ENCOUNTER — Encounter (INDEPENDENT_AMBULATORY_CARE_PROVIDER_SITE_OTHER): Payer: Self-pay | Admitting: Physical Medicine and Rehabilitation

## 2017-02-02 VITALS — BP 142/75 | HR 77

## 2017-02-02 DIAGNOSIS — G8929 Other chronic pain: Secondary | ICD-10-CM

## 2017-02-02 DIAGNOSIS — G894 Chronic pain syndrome: Secondary | ICD-10-CM | POA: Diagnosis not present

## 2017-02-02 DIAGNOSIS — M545 Low back pain: Secondary | ICD-10-CM

## 2017-02-02 DIAGNOSIS — M25561 Pain in right knee: Secondary | ICD-10-CM | POA: Diagnosis not present

## 2017-02-02 DIAGNOSIS — M47816 Spondylosis without myelopathy or radiculopathy, lumbar region: Secondary | ICD-10-CM | POA: Diagnosis not present

## 2017-02-02 DIAGNOSIS — M25562 Pain in left knee: Secondary | ICD-10-CM

## 2017-02-02 NOTE — Progress Notes (Deleted)
Back has been "doing pretty good." Still hurts when washing dishes. Feels better if she keeps back straight or if she uses lumbar support in car. Bending forward increases pain. Sweeping also causes pain.

## 2017-02-03 ENCOUNTER — Encounter (INDEPENDENT_AMBULATORY_CARE_PROVIDER_SITE_OTHER): Payer: Self-pay | Admitting: Physical Medicine and Rehabilitation
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
## 2017-02-04 ENCOUNTER — Telehealth: Payer: Self-pay

## 2017-02-04 ENCOUNTER — Ambulatory Visit (INDEPENDENT_AMBULATORY_CARE_PROVIDER_SITE_OTHER): Payer: Medicare PPO

## 2017-02-04 DIAGNOSIS — Z95810 Presence of automatic (implantable) cardiac defibrillator: Secondary | ICD-10-CM

## 2017-02-04 DIAGNOSIS — I5032 Chronic diastolic (congestive) heart failure: Secondary | ICD-10-CM

## 2017-02-04 NOTE — Progress Notes (Signed)
EPIC Encounter for ICM Monitoring  Patient Name: Carolyn [name] is a 76 y.o. female Date: 02/04/2017 Primary Care Physican: Biagio Borg, MD Primary Cardiologist: Angelena Form Electrophysiologist: Allred Dry Weight:Previous weight 164 lbs Bi-V Pacing: >99%%      Attempted call to patient and unable to reach.  Left message to return call regarding transmission.  Transmission reviewed.    Thoracic impedance abnormal suggesting fluid accumulation since 01/22/2017.  Prescribed dosage: Furosemide 20 mg 1 tablet daily.   Labs: 09/15/2016 Creatinine 0.84, BUN 9, Potassium 3.8, Sodium 140 03/10/2016 Creatinine 0.76, BUN 12, Potassium 3.6, Sodium 138  Recommendations: NONE - Unable to reach.  Follow-up plan: ICM clinic phone appointment on 02/12/2017 to recheck fluid levels.   Copy of ICM check sent to Dr. Rayann Heman and Dr. Julianne Handler for review.  3 month ICM trend: 02/04/2017    1 Year ICM trend:       Rosalene Billings, RN 02/04/2017 8:47 AM

## 2017-02-04 NOTE — Telephone Encounter (Signed)
Remote ICM transmission received.  Attempted call to patient and left detailed message per DPR regarding transmission and to return call.

## 2017-02-05 NOTE — Progress Notes (Signed)
Patient returned call.  Transmission reviewed and explained impedance suggests fluid accumulation.  She said her fingers are slightly swollen and weight today 165 lbs which is about her baseline.  She said she cannot really tell she has any fluid.  She has not been following low salt diet. Advised to limit salt to 2000 mg daily and she will need to read food labels to determine amount of sodium and add the total for today.  Advised to increase Furosemide 20 mg to 1 tablet twice a day x 3 days and then return to 1 tablet daily.  She verbalized understanding.  Will recheck fluid levels on 02/12/2017.

## 2017-02-08 ENCOUNTER — Institutional Professional Consult (permissible substitution) (INDEPENDENT_AMBULATORY_CARE_PROVIDER_SITE_OTHER): Payer: Medicare PPO | Admitting: Orthopaedic Surgery

## 2017-02-10 ENCOUNTER — Other Ambulatory Visit: Payer: Self-pay | Admitting: Cardiovascular Disease

## 2017-02-12 ENCOUNTER — Ambulatory Visit (INDEPENDENT_AMBULATORY_CARE_PROVIDER_SITE_OTHER): Payer: Medicare PPO

## 2017-02-12 DIAGNOSIS — Z95810 Presence of automatic (implantable) cardiac defibrillator: Secondary | ICD-10-CM | POA: Diagnosis not present

## 2017-02-12 DIAGNOSIS — I5032 Chronic diastolic (congestive) heart failure: Secondary | ICD-10-CM

## 2017-02-12 NOTE — Progress Notes (Signed)
EPIC Encounter for ICM Monitoring  Patient Name: Carolyn [name] is a 76 y.o. female Date: 02/12/2017 Primary Care Physican: Biagio Borg, MD Primary Cardiologist: Angelena Form Electrophysiologist: Allred Dry Weight:Previous weight 164 lbs Bi-V Pacing: >99%%       Attempted call to patient and unable to reach.  Left detailed message regarding transmission.  Transmission reviewed.    Thoracic impedance returned to normal after taking 3 days of extra Furosemide.    Prescribed dosage: Furosemide 20 mg 1tablet daily.   Labs: 09/15/2016 Creatinine 0.84, BUN 9, Potassium 3.8, Sodium 140 03/10/2016 Creatinine 0.76, BUN 12, Potassium 3.6, Sodium 138  Recommendations: Left voice mail with ICM number and encouraged to call if experiencing any fluid symptoms.  Follow-up plan: ICM clinic phone appointment on 03/11/2017.    Copy of ICM check sent to Dr. Rayann Heman.  3 month ICM trend: 02/11/2017    1 Year ICM trend:       Rosalene Billings, RN 02/12/2017 8:56 AM

## 2017-02-15 ENCOUNTER — Institutional Professional Consult (permissible substitution) (INDEPENDENT_AMBULATORY_CARE_PROVIDER_SITE_OTHER): Payer: Medicare PPO | Admitting: Orthopaedic Surgery

## 2017-02-23 ENCOUNTER — Ambulatory Visit (INDEPENDENT_AMBULATORY_CARE_PROVIDER_SITE_OTHER): Payer: Medicare PPO

## 2017-02-23 ENCOUNTER — Encounter (INDEPENDENT_AMBULATORY_CARE_PROVIDER_SITE_OTHER): Payer: Self-pay | Admitting: Orthopaedic Surgery

## 2017-02-23 ENCOUNTER — Ambulatory Visit (INDEPENDENT_AMBULATORY_CARE_PROVIDER_SITE_OTHER): Payer: Medicare PPO | Admitting: Orthopaedic Surgery

## 2017-02-23 DIAGNOSIS — M25562 Pain in left knee: Secondary | ICD-10-CM

## 2017-02-23 DIAGNOSIS — M1712 Unilateral primary osteoarthritis, left knee: Secondary | ICD-10-CM

## 2017-02-23 DIAGNOSIS — G8929 Other chronic pain: Secondary | ICD-10-CM | POA: Diagnosis not present

## 2017-02-23 DIAGNOSIS — M25561 Pain in right knee: Secondary | ICD-10-CM

## 2017-02-23 DIAGNOSIS — M1711 Unilateral primary osteoarthritis, right knee: Secondary | ICD-10-CM

## 2017-02-23 MED ORDER — MELOXICAM 7.5 MG PO TABS: 7.5000 mg | ORAL_TABLET | Freq: Two times a day (BID) | ORAL | 2 refills | Status: DC | PRN

## 2017-02-23 MED ORDER — DICLOFENAC SODIUM 1 % TD GEL: 2.0000 g | Freq: Four times a day (QID) | TRANSDERMAL | 5 refills | Status: DC
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
## 2017-02-28 ENCOUNTER — Other Ambulatory Visit: Payer: Self-pay | Admitting: Internal Medicine

## 2017-03-05 ENCOUNTER — Other Ambulatory Visit: Payer: Self-pay | Admitting: Internal Medicine

## 2017-03-08 DIAGNOSIS — L821 Other seborrheic keratosis: Secondary | ICD-10-CM | POA: Diagnosis not present

## 2017-03-08 DIAGNOSIS — L570 Actinic keratosis: Secondary | ICD-10-CM | POA: Diagnosis not present

## 2017-03-08 DIAGNOSIS — Z85828 Personal history of other malignant neoplasm of skin: Secondary | ICD-10-CM | POA: Diagnosis not present

## 2017-03-08 DIAGNOSIS — D485 Neoplasm of uncertain behavior of skin: Secondary | ICD-10-CM | POA: Diagnosis not present

## 2017-03-08 DIAGNOSIS — L814 Other melanin hyperpigmentation: Secondary | ICD-10-CM | POA: Diagnosis not present

## 2017-03-08 DIAGNOSIS — D1801 Hemangioma of skin and subcutaneous tissue: Secondary | ICD-10-CM | POA: Diagnosis not present

## 2017-03-11 ENCOUNTER — Telehealth: Payer: Self-pay | Admitting: Cardiology

## 2017-03-11 NOTE — Telephone Encounter (Signed)
LMOVM reminding pt to send remote transmission.   

## 2017-03-23 ENCOUNTER — Ambulatory Visit: Payer: Medicare PPO | Admitting: Internal Medicine

## 2017-03-23 NOTE — Progress Notes (Signed)
No ICM remote transmission received for 03/11/2017 and next ICM transmission scheduled for 04/08/2017.

## 2017-03-25 ENCOUNTER — Other Ambulatory Visit: Payer: Self-pay | Admitting: Internal Medicine

## 2017-04-05 ENCOUNTER — Other Ambulatory Visit: Payer: Self-pay | Admitting: Internal Medicine

## 2017-04-05 NOTE — Telephone Encounter (Signed)
Done erx 

## 2017-04-06 ENCOUNTER — Encounter: Payer: Self-pay | Admitting: Internal Medicine

## 2017-04-06 ENCOUNTER — Other Ambulatory Visit (INDEPENDENT_AMBULATORY_CARE_PROVIDER_SITE_OTHER): Payer: Medicare PPO

## 2017-04-06 ENCOUNTER — Other Ambulatory Visit: Payer: Self-pay | Admitting: Internal Medicine

## 2017-04-06 ENCOUNTER — Ambulatory Visit (INDEPENDENT_AMBULATORY_CARE_PROVIDER_SITE_OTHER): Payer: Medicare PPO | Admitting: Internal Medicine

## 2017-04-06 VITALS — BP 126/84 | HR 75 | Temp 97.7°F | Ht 60.0 in | Wt 161.0 lb

## 2017-04-06 DIAGNOSIS — Z0000 Encounter for general adult medical examination without abnormal findings: Secondary | ICD-10-CM | POA: Diagnosis not present

## 2017-04-06 DIAGNOSIS — Z794 Long term (current) use of insulin: Secondary | ICD-10-CM

## 2017-04-06 DIAGNOSIS — Z23 Encounter for immunization: Secondary | ICD-10-CM

## 2017-04-06 DIAGNOSIS — E119 Type 2 diabetes mellitus without complications: Secondary | ICD-10-CM

## 2017-04-06 LAB — CBC WITH DIFFERENTIAL/PLATELET
BASOS ABS: 0.1 10*3/uL (ref 0.0–0.1)
Basophils Relative: 1.3 % (ref 0.0–3.0)
EOS PCT: 1.9 % (ref 0.0–5.0)
Eosinophils Absolute: 0.2 10*3/uL (ref 0.0–0.7)
HCT: 38.1 % (ref 36.0–46.0)
HEMOGLOBIN: 12.4 g/dL (ref 12.0–15.0)
LYMPHS ABS: 3.5 10*3/uL (ref 0.7–4.0)
Lymphocytes Relative: 33.2 % (ref 12.0–46.0)
MCHC: 32.7 g/dL (ref 30.0–36.0)
MCV: 91.3 fl (ref 78.0–100.0)
MONO ABS: 1 10*3/uL (ref 0.1–1.0)
Monocytes Relative: 9.5 % (ref 3.0–12.0)
NEUTROS PCT: 54.1 % (ref 43.0–77.0)
Neutro Abs: 5.7 10*3/uL (ref 1.4–7.7)
Platelets: 288 10*3/uL (ref 150.0–400.0)
RBC: 4.17 Mil/uL (ref 3.87–5.11)
RDW: 13.5 % (ref 11.5–15.5)
WBC: 10.5 10*3/uL (ref 4.0–10.5)

## 2017-04-06 LAB — HEPATIC FUNCTION PANEL
ALK PHOS: 43 U/L (ref 39–117)
ALT: 10 U/L (ref 0–35)
AST: 14 U/L (ref 0–37)
Albumin: 3.9 g/dL (ref 3.5–5.2)
BILIRUBIN DIRECT: 0.1 mg/dL (ref 0.0–0.3)
BILIRUBIN TOTAL: 0.4 mg/dL (ref 0.2–1.2)
Total Protein: 7 g/dL (ref 6.0–8.3)

## 2017-04-06 LAB — LIPID PANEL
CHOL/HDL RATIO: 3
Cholesterol: 110 mg/dL (ref 0–200)
HDL: 35 mg/dL — AB (ref >39.00)
LDL Cholesterol: 52 mg/dL (ref 0–99)
NONHDL: 74.62
Triglycerides: 113 mg/dL (ref 0.0–149.0)
VLDL: 22.6 mg/dL (ref 0.0–40.0)

## 2017-04-06 LAB — URINALYSIS, ROUTINE W REFLEX MICROSCOPIC
Bilirubin Urine: NEGATIVE
HGB URINE DIPSTICK: NEGATIVE
Ketones, ur: NEGATIVE
Nitrite: POSITIVE — AB
RBC / HPF: NONE SEEN (ref 0–?)
TOTAL PROTEIN, URINE-UPE24: NEGATIVE
URINE GLUCOSE: NEGATIVE
UROBILINOGEN UA: 0.2 (ref 0.0–1.0)
pH: 6 (ref 5.0–8.0)

## 2017-04-06 LAB — BASIC METABOLIC PANEL
BUN: 13 mg/dL (ref 6–23)
CALCIUM: 9.3 mg/dL (ref 8.4–10.5)
CO2: 30 meq/L (ref 19–32)
Chloride: 103 mEq/L (ref 96–112)
Creatinine, Ser: 0.89 mg/dL (ref 0.40–1.20)
GFR: 65.65 mL/min (ref >60.00)
GLUCOSE: 73 mg/dL (ref 70–99)
Potassium: 4.2 mEq/L (ref 3.5–5.1)
Sodium: 139 mEq/L (ref 135–145)

## 2017-04-06 LAB — MICROALBUMIN / CREATININE URINE RATIO
Creatinine,U: 194.2 mg/dL
MICROALB UR: 2.1 mg/dL — AB (ref 0.0–1.9)
MICROALB/CREAT RATIO: 1.1 mg/g (ref 0.0–30.0)

## 2017-04-06 LAB — HEMOGLOBIN A1C: HEMOGLOBIN A1C: 6.4 % (ref 4.6–6.5)

## 2017-04-06 LAB — TSH: TSH: 0.93 u[IU]/mL (ref 0.35–4.50)

## 2017-04-06 MED ORDER — NITROFURANTOIN MACROCRYSTAL 50 MG PO CAPS: 50.0000 mg | ORAL_CAPSULE | Freq: Two times a day (BID) | ORAL | 0 refills | Status: DC
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
## 2017-04-06 NOTE — Assessment & Plan Note (Signed)
Here for wellness and f/u;  Overall doing ok;  Pt denies Chest pain, worsening SOB, DOE, wheezing, orthopnea, PND, worsening LE edema, palpitations, dizziness or syncope.  Pt denies neurological change such as new headache, facial or extremity weakness.  Pt denies polydipsia, polyuria, or low sugar symptoms. Pt states overall good compliance with treatment and medications, good tolerability, and has been trying to follow appropriate diet.  Pt denies worsening depressive symptoms, suicidal ideation or panic. No fever, night sweats, wt loss, loss of appetite, or other constitutional symptoms.  Pt states good ability with ADL's, has low fall risk, home safety reviewed and adequate, no other significant changes in hearing or vision, and only occasionally active with exercise.

## 2017-04-06 NOTE — Assessment & Plan Note (Signed)
stable overall by history and exam, recent data reviewed with pt, and pt to continue medical treatment as before,  to f/u any worsening symptoms or concerns Lab Results  Component Value Date   HGBA1C 7.0 (H) 09/15/2016  for f/u lab

## 2017-04-06 NOTE — Patient Instructions (Addendum)
You had the flu shot today  Please continue all other medications as before, and refills have been done if requested.  Please have the pharmacy call with any other refills you may need.  Please continue your efforts at being more active, low cholesterol diet, and weight control.  You are otherwise up to date with prevention measures today.  Please keep your appointments with your specialists as you may have planned  You will be contacted regarding the referral for: eye doctor and colonoscopy  Please go to the LAB in the Basement (turn left off the elevator) for the tests to be done today  You will be contacted by phone if any changes need to be made immediately.  Otherwise, you will receive a letter about your results with an explanation, but please check with MyChart first.  Please remember to sign up for MyChart if you have not done so, as this will be important to you in the future with finding out test results, communicating by private email, and scheduling acute appointments online when needed.  Please return in 6 months, or sooner if needed, with Lab testing done 3-5 days before

## 2017-04-07 ENCOUNTER — Telehealth: Payer: Self-pay

## 2017-04-07 NOTE — Telephone Encounter (Signed)
-----   Message from Biagio Borg, MD sent at 04/06/2017  4:54 PM EST ----- Ok to let pt know that UA is c/w possible UTI -   Will do antibiotic erx  [name] to please inform pt, I will do rx

## 2017-04-07 NOTE — Telephone Encounter (Addendum)
Noted.   Referrals was addressed at the [facility] yesterday. See referral tab in chart.

## 2017-04-07 NOTE — Telephone Encounter (Signed)
Called pt, LVM.   CRM created.  

## 2017-04-07 NOTE — Telephone Encounter (Signed)
Pt. Given lab results and instructions.Verbalizes understanding.

## 2017-04-08 ENCOUNTER — Telehealth: Payer: Self-pay

## 2017-04-08 ENCOUNTER — Ambulatory Visit (INDEPENDENT_AMBULATORY_CARE_PROVIDER_SITE_OTHER): Payer: Medicare PPO | Admitting: *Deleted

## 2017-04-08 DIAGNOSIS — Z95810 Presence of automatic (implantable) cardiac defibrillator: Secondary | ICD-10-CM

## 2017-04-08 DIAGNOSIS — I5032 Chronic diastolic (congestive) heart failure: Secondary | ICD-10-CM

## 2017-04-08 DIAGNOSIS — I428 Other cardiomyopathies: Secondary | ICD-10-CM | POA: Diagnosis not present

## 2017-04-08 NOTE — Progress Notes (Signed)
EPIC Encounter for ICM Monitoring  Patient Name: Carolyn [name] is a 77 y.o. female Date: 04/08/2017 Primary Care Physican: Biagio Borg, MD Primary Cardiologist: Angelena Form Electrophysiologist: Allred Dry Weight:Prior [id number] lbs Bi-V Pacing: >99%%      Attempted call to patient and unable to reach.  Left detailed message regarding transmission.  Transmission reviewed.    Thoracic impedance normal.  Prescribed dosage: Furosemide 20 mg 1tablet daily.   Labs: 04/06/2017 Creatinine 0.89, BUN 13, Potassium 4.2, Sodium 139, EGFR 65.65 09/15/2016 Creatinine 0.84, BUN 9, Potassium 3.8, Sodium 140 03/10/2016 Creatinine 0.76, BUN 12, Potassium 3.6, Sodium 138  Recommendations: Left voice mail with ICM number and encouraged to call if experiencing any fluid symptoms.  Follow-up plan: ICM clinic phone appointment on 05/10/2017.    Copy of ICM check sent to Dr. Rayann Heman.   3 month ICM trend: 04/08/2017    1 Year ICM trend:       Rosalene Billings, RN 04/08/2017 2:03 PM

## 2017-04-08 NOTE — Progress Notes (Signed)
Remote ICD transmission.   

## 2017-04-08 NOTE — Telephone Encounter (Signed)
Remote ICM transmission received.  Attempted call to patient and left detailed message per DPR regarding transmission and next ICM scheduled for 05/10/2017.  Advised to return call for any fluid symptoms or questions.

## 2017-04-09 ENCOUNTER — Encounter: Payer: Self-pay | Admitting: Cardiology

## 2017-04-19 ENCOUNTER — Other Ambulatory Visit: Payer: Self-pay | Admitting: Internal Medicine

## 2017-04-21 ENCOUNTER — Other Ambulatory Visit: Payer: Self-pay | Admitting: Internal Medicine

## 2017-04-21 DIAGNOSIS — I10 Essential (primary) hypertension: Secondary | ICD-10-CM

## 2017-04-24 LAB — CUP PACEART REMOTE DEVICE CHECK
Battery Remaining Longevity: 59 mo
Brady Statistic AS VP Percent: 97 %
Brady Statistic AS VS Percent: 1 %
HIGH POWER IMPEDANCE MEASURED VALUE: 66 Ohm
HIGH POWER IMPEDANCE MEASURED VALUE: 66 Ohm
Implantable Lead Implant Date: 20100930
Implantable Lead Implant Date: 20100930
Implantable Lead Location: 753858
Implantable Lead Location: 753859
Implantable Lead Model: 7122
Implantable Pulse Generator Implant Date: 20161215
Lead Channel Impedance Value: 540 Ohm
Lead Channel Pacing Threshold Amplitude: 0.75 V
Lead Channel Pacing Threshold Pulse Width: 0.5 ms
Lead Channel Sensing Intrinsic Amplitude: 1.7 mV
Lead Channel Sensing Intrinsic Amplitude: 11.6 mV
Lead Channel Setting Pacing Amplitude: 2 V
Lead Channel Setting Pacing Amplitude: 2.5 V
Lead Channel Setting Pacing Pulse Width: 0.5 ms
Lead Channel Setting Sensing Sensitivity: 0.5 mV
MDC IDC LEAD IMPLANT DT: 20100930
MDC IDC LEAD LOCATION: 753860
MDC IDC MSMT BATTERY REMAINING PERCENTAGE: 69 %
MDC IDC MSMT BATTERY VOLTAGE: 2.99 V
MDC IDC MSMT LEADCHNL LV PACING THRESHOLD AMPLITUDE: 1 V
MDC IDC MSMT LEADCHNL LV PACING THRESHOLD PULSEWIDTH: 0.5 ms
MDC IDC MSMT LEADCHNL RA IMPEDANCE VALUE: 350 Ohm
MDC IDC MSMT LEADCHNL RV IMPEDANCE VALUE: 650 Ohm
MDC IDC MSMT LEADCHNL RV PACING THRESHOLD AMPLITUDE: 0.75 V
MDC IDC MSMT LEADCHNL RV PACING THRESHOLD PULSEWIDTH: 0.5 ms
MDC IDC PG SERIAL: 7247188
MDC IDC SESS DTM: 20190131070019
MDC IDC SET LEADCHNL RA PACING AMPLITUDE: 2 V
MDC IDC SET LEADCHNL RV PACING PULSEWIDTH: 0.5 ms
MDC IDC STAT BRADY AP VP PERCENT: 3.1 %
MDC IDC STAT BRADY AP VS PERCENT: 1 %
MDC IDC STAT BRADY RA PERCENT PACED: 3.1 %

## 2017-04-29 ENCOUNTER — Other Ambulatory Visit: Payer: Self-pay | Admitting: Internal Medicine

## 2017-05-10 ENCOUNTER — Ambulatory Visit (INDEPENDENT_AMBULATORY_CARE_PROVIDER_SITE_OTHER): Payer: Medicare PPO

## 2017-05-10 ENCOUNTER — Telehealth: Payer: Self-pay

## 2017-05-10 DIAGNOSIS — I5032 Chronic diastolic (congestive) heart failure: Secondary | ICD-10-CM | POA: Diagnosis not present

## 2017-05-10 DIAGNOSIS — Z95810 Presence of automatic (implantable) cardiac defibrillator: Secondary | ICD-10-CM | POA: Diagnosis not present

## 2017-05-10 NOTE — Telephone Encounter (Signed)
Remote ICM transmission received.  Attempted call to patient and left detailed message per DPR regarding transmission and next ICM scheduled for 06/10/2017.  Advised to return call for any fluid symptoms or questions.

## 2017-05-10 NOTE — Progress Notes (Signed)
EPIC Encounter for ICM Monitoring  Patient Name: Carolyn [name] is a 77 y.o. female Date: 05/10/2017 Primary Care Physican: Biagio Borg, MD Primary Cardiologist: Angelena Form Electrophysiologist: Allred Dry Weight:Prior [id number] lbs Bi-V Pacing: >99%%       Attempted call to patient and unable to reach.  Left detailed message regarding transmission.  Transmission reviewed.    Thoracic impedance normal.  Prescribed dosage: Furosemide 20 mg 1tablet daily.   Labs: 04/06/2017 Creatinine 0.89, BUN 13, Potassium 4.2, Sodium 139, EGFR 65.65 09/15/2016 Creatinine 0.84, BUN 9, Potassium 3.8, Sodium 140 03/10/2016 Creatinine 0.76, BUN 12, Potassium 3.6, Sodium 138  Recommendations: Left voice mail with ICM number and encouraged to call if experiencing any fluid symptoms.  Follow-up plan: ICM clinic phone appointment on 06/10/2017.    Copy of ICM check sent to Dr. Rayann Heman.   3 month ICM trend: 05/10/2017    1 Year ICM trend:       Rosalene Billings, RN 05/10/2017 12:17 PM

## 2017-05-11 ENCOUNTER — Encounter: Payer: Self-pay | Admitting: Internal Medicine

## 2017-05-11 ENCOUNTER — Other Ambulatory Visit: Payer: Self-pay | Admitting: Internal Medicine

## 2017-05-19 ENCOUNTER — Other Ambulatory Visit: Payer: Self-pay | Admitting: Internal Medicine

## 2017-05-19 DIAGNOSIS — I10 Essential (primary) hypertension: Secondary | ICD-10-CM

## 2017-05-27 ENCOUNTER — Emergency Department (HOSPITAL_COMMUNITY)
Admission: EM | Admit: 2017-05-27 | Discharge: 2017-05-27 | Disposition: A | Discharge status: Home / Self Care | Payer: Medicare PPO | Attending: Emergency Medicine | Admitting: Emergency Medicine

## 2017-05-27 ENCOUNTER — Emergency Department (HOSPITAL_COMMUNITY): Payer: Medicare PPO

## 2017-05-27 ENCOUNTER — Encounter (HOSPITAL_COMMUNITY): Payer: Self-pay | Admitting: Emergency Medicine

## 2017-05-27 ENCOUNTER — Other Ambulatory Visit: Payer: Self-pay

## 2017-05-27 DIAGNOSIS — S3992XA Unspecified injury of lower back, initial encounter: Secondary | ICD-10-CM | POA: Diagnosis not present

## 2017-05-27 DIAGNOSIS — S39012A Strain of muscle, fascia and tendon of lower back, initial encounter: Secondary | ICD-10-CM | POA: Diagnosis not present

## 2017-05-27 DIAGNOSIS — I5022 Chronic systolic (congestive) heart failure: Secondary | ICD-10-CM | POA: Diagnosis not present

## 2017-05-27 DIAGNOSIS — T148XXA Other injury of unspecified body region, initial encounter: Secondary | ICD-10-CM | POA: Diagnosis not present

## 2017-05-27 DIAGNOSIS — Y999 Unspecified external cause status: Secondary | ICD-10-CM | POA: Insufficient documentation

## 2017-05-27 DIAGNOSIS — I10 Essential (primary) hypertension: Secondary | ICD-10-CM | POA: Diagnosis not present

## 2017-05-27 DIAGNOSIS — Y939 Activity, unspecified: Secondary | ICD-10-CM | POA: Insufficient documentation

## 2017-05-27 DIAGNOSIS — Z794 Long term (current) use of insulin: Secondary | ICD-10-CM | POA: Diagnosis not present

## 2017-05-27 DIAGNOSIS — S7012XA Contusion of left thigh, initial encounter: Secondary | ICD-10-CM | POA: Diagnosis not present

## 2017-05-27 DIAGNOSIS — F17210 Nicotine dependence, cigarettes, uncomplicated: Secondary | ICD-10-CM | POA: Diagnosis not present

## 2017-05-27 DIAGNOSIS — S79921A Unspecified injury of right thigh, initial encounter: Secondary | ICD-10-CM | POA: Diagnosis not present

## 2017-05-27 DIAGNOSIS — Z79899 Other long term (current) drug therapy: Secondary | ICD-10-CM | POA: Insufficient documentation

## 2017-05-27 DIAGNOSIS — E119 Type 2 diabetes mellitus without complications: Secondary | ICD-10-CM | POA: Insufficient documentation

## 2017-05-27 DIAGNOSIS — Y929 Unspecified place or not applicable: Secondary | ICD-10-CM | POA: Diagnosis not present

## 2017-05-27 DIAGNOSIS — Z95810 Presence of automatic (implantable) cardiac defibrillator: Secondary | ICD-10-CM | POA: Insufficient documentation

## 2017-05-27 DIAGNOSIS — M5489 Other dorsalgia: Secondary | ICD-10-CM | POA: Diagnosis not present

## 2017-05-27 DIAGNOSIS — S299XXA Unspecified injury of thorax, initial encounter: Secondary | ICD-10-CM | POA: Diagnosis not present

## 2017-05-27 DIAGNOSIS — S3993XA Unspecified injury of pelvis, initial encounter: Secondary | ICD-10-CM | POA: Diagnosis not present

## 2017-05-27 DIAGNOSIS — Z041 Encounter for examination and observation following transport accident: Secondary | ICD-10-CM | POA: Diagnosis present

## 2017-05-27 MED ORDER — OXYCODONE-ACETAMINOPHEN 5-325 MG PO TABS: 1.0000 | ORAL_TABLET | ORAL | 0 refills | Status: DC | PRN

## 2017-05-27 NOTE — Discharge Instructions (Addendum)
Take acetaminophen, naproxen, or ibuprofen as needed for less severe pain.  Apply ice to sore areas several times a day.
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
## 2017-05-29 ENCOUNTER — Other Ambulatory Visit (INDEPENDENT_AMBULATORY_CARE_PROVIDER_SITE_OTHER): Payer: Self-pay | Admitting: Orthopaedic Surgery

## 2017-06-03 ENCOUNTER — Encounter: Payer: Self-pay | Admitting: Internal Medicine

## 2017-06-07 ENCOUNTER — Ambulatory Visit (INDEPENDENT_AMBULATORY_CARE_PROVIDER_SITE_OTHER): Payer: Medicare PPO | Admitting: Orthopaedic Surgery

## 2017-06-10 ENCOUNTER — Telehealth: Payer: Self-pay

## 2017-06-10 ENCOUNTER — Encounter (INDEPENDENT_AMBULATORY_CARE_PROVIDER_SITE_OTHER): Payer: Self-pay | Admitting: Orthopaedic Surgery

## 2017-06-10 ENCOUNTER — Ambulatory Visit (INDEPENDENT_AMBULATORY_CARE_PROVIDER_SITE_OTHER): Payer: Medicare PPO

## 2017-06-10 ENCOUNTER — Ambulatory Visit (INDEPENDENT_AMBULATORY_CARE_PROVIDER_SITE_OTHER): Payer: Medicare PPO | Admitting: Orthopaedic Surgery

## 2017-06-10 DIAGNOSIS — G8929 Other chronic pain: Secondary | ICD-10-CM

## 2017-06-10 DIAGNOSIS — M5441 Lumbago with sciatica, right side: Secondary | ICD-10-CM

## 2017-06-10 DIAGNOSIS — I5032 Chronic diastolic (congestive) heart failure: Secondary | ICD-10-CM | POA: Diagnosis not present

## 2017-06-10 DIAGNOSIS — Z95810 Presence of automatic (implantable) cardiac defibrillator: Secondary | ICD-10-CM | POA: Diagnosis not present

## 2017-06-10 MED ORDER — METHYLPREDNISOLONE 4 MG PO TBPK: ORAL_TABLET | ORAL | 0 refills | Status: DC

## 2017-06-10 MED ORDER — TRAMADOL HCL 50 MG PO TABS: 50.0000 mg | ORAL_TABLET | Freq: Three times a day (TID) | ORAL | 0 refills | Status: DC | PRN

## 2017-06-10 NOTE — Telephone Encounter (Signed)
Remote ICM transmission received.  Attempted call to patient and left message to return call. 
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
## 2017-06-18 ENCOUNTER — Ambulatory Visit (INDEPENDENT_AMBULATORY_CARE_PROVIDER_SITE_OTHER): Payer: Self-pay

## 2017-06-18 ENCOUNTER — Telehealth: Payer: Self-pay

## 2017-06-18 DIAGNOSIS — I5032 Chronic diastolic (congestive) heart failure: Secondary | ICD-10-CM

## 2017-06-18 DIAGNOSIS — Z95810 Presence of automatic (implantable) cardiac defibrillator: Secondary | ICD-10-CM

## 2017-06-18 NOTE — Progress Notes (Signed)
EPIC Encounter for ICM Monitoring  Patient Name: Carolyn [name] is a 77 y.o. female Date: 06/18/2017 Primary Care Physican: Biagio Borg, MD Primary Cardiologist: Angelena Form Electrophysiologist: Allred Dry Weight:Priorweight164lbs Bi-V Pacing: >99%%      Attempted call to patient and unable to reach.  Left message to return call.  Transmission reviewed.    Thoracic impedance abnormal suggesting fluid accumulation starting 06/05/2017 and impedance was also decreased from 05/23/2017 to 05/29/2017.  Prescribed dosage: Furosemide 20 mg 1tablet daily.   Labs: 04/06/2017 Creatinine 0.89, BUN 13, Potassium 4.2, Sodium 139, EGFR 65.65 09/15/2016 Creatinine 0.84, BUN 9, Potassium 3.8, Sodium 140 03/10/2016 Creatinine 0.76, BUN 12, Potassium 3.6, Sodium 138  Recommendations: NONE - Unable to reach.  Follow-up plan: ICM clinic phone appointment on 06/24/2017 (manual send).    Copy of ICM check sent to Dr. Rayann Heman and Dr. Angelena Form.   3 month ICM trend: 06/18/2017    1 Year ICM trend:       Rosalene Billings, RN 06/18/2017 9:49 AM

## 2017-06-18 NOTE — Telephone Encounter (Signed)
Remote ICM transmission received.  Attempted call to patient and left detailed message per DPR to return call regarding transmission. 

## 2017-06-22 ENCOUNTER — Other Ambulatory Visit: Payer: Self-pay | Admitting: Cardiovascular Disease

## 2017-06-23 ENCOUNTER — Other Ambulatory Visit: Payer: Self-pay | Admitting: Internal Medicine

## 2017-06-24 ENCOUNTER — Telehealth: Payer: Self-pay | Admitting: Cardiology

## 2017-06-24 NOTE — Telephone Encounter (Signed)
LMOVM reminding pt to send remote transmission.   

## 2017-07-05 ENCOUNTER — Other Ambulatory Visit: Payer: Self-pay | Admitting: Internal Medicine

## 2017-07-06 NOTE — Telephone Encounter (Signed)
Done erx 

## 2017-07-06 NOTE — Telephone Encounter (Signed)
06/02/2017 90# 

## 2017-07-09 ENCOUNTER — Other Ambulatory Visit: Payer: Self-pay | Admitting: Internal Medicine

## 2017-07-09 NOTE — Progress Notes (Signed)
No ICM remote transmission received for 06/24/2017 and next ICM transmission scheduled for 07/12/2017.

## 2017-07-09 NOTE — Telephone Encounter (Signed)
06/10/2017 120# 

## 2017-07-09 NOTE — Telephone Encounter (Signed)
Done erx 

## 2017-07-12 ENCOUNTER — Ambulatory Visit (INDEPENDENT_AMBULATORY_CARE_PROVIDER_SITE_OTHER): Payer: Medicare PPO | Admitting: Orthopaedic Surgery

## 2017-07-12 ENCOUNTER — Ambulatory Visit (INDEPENDENT_AMBULATORY_CARE_PROVIDER_SITE_OTHER): Payer: Medicare PPO | Admitting: *Deleted

## 2017-07-12 ENCOUNTER — Telehealth: Payer: Self-pay

## 2017-07-12 ENCOUNTER — Ambulatory Visit (INDEPENDENT_AMBULATORY_CARE_PROVIDER_SITE_OTHER): Payer: Medicare PPO

## 2017-07-12 DIAGNOSIS — I428 Other cardiomyopathies: Secondary | ICD-10-CM

## 2017-07-12 DIAGNOSIS — Z95810 Presence of automatic (implantable) cardiac defibrillator: Secondary | ICD-10-CM | POA: Diagnosis not present

## 2017-07-12 DIAGNOSIS — I5032 Chronic diastolic (congestive) heart failure: Secondary | ICD-10-CM

## 2017-07-12 NOTE — Progress Notes (Signed)
EPIC Encounter for ICM Monitoring  Patient Name: Carolyn [name] is a 77 y.o. female Date: 07/12/2017 Primary Care Physican: Biagio Borg, MD Primary Cardiologist: Angelena Form Electrophysiologist: Allred Dry Weight:Previousweight164lbs Bi-V Pacing: >99%%       Attempted call to patient and unable to reach.  Left message to return call regarding transmission.  Transmission reviewed.    Thoracic impedance abnormal suggesting fluid accumulation since 07/04/2017.  Thoracic impedance also suggesting fluid from 30/30/2019 to 06/19/2017.   Total of decreased impedance is 31 days since 05/20/2017.  Prescribed dosage: Furosemide 20 mg 1tablet daily.   Labs: 04/06/2017 Creatinine 0.89, BUN 13, Potassium 4.2, Sodium 139, EGFR 65.65 09/15/2016 Creatinine 0.84, BUN 9, Potassium 3.8, Sodium 140 03/10/2016 Creatinine 0.76, BUN 12, Potassium 3.6, Sodium 138  Recommendations: NONE - Unable to reach.    Will recommend to increase Furosemide 20 mg 2 tablets x 3 days if patient returns call.  Follow-up plan: ICM clinic phone appointment on 07/20/2017 to recheck fluid levels.    Copy of ICM check sent to Dr. Rayann Heman and Dr. Angelena Form.   3 month ICM trend: 07/12/2017    1 Year ICM trend:       Rosalene Billings, RN 07/12/2017 2:04 PM

## 2017-07-12 NOTE — Telephone Encounter (Signed)
Remote ICM transmission received.  Attempted call to patient and left detailed message, per DPR, to return call regarding transmission.

## 2017-07-12 NOTE — Progress Notes (Signed)
Remote ICD transmission.   

## 2017-07-14 ENCOUNTER — Encounter: Payer: Self-pay | Admitting: Cardiology

## 2017-07-20 ENCOUNTER — Ambulatory Visit (INDEPENDENT_AMBULATORY_CARE_PROVIDER_SITE_OTHER): Payer: Medicare PPO

## 2017-07-20 DIAGNOSIS — Z95810 Presence of automatic (implantable) cardiac defibrillator: Secondary | ICD-10-CM

## 2017-07-20 DIAGNOSIS — I5032 Chronic diastolic (congestive) heart failure: Secondary | ICD-10-CM
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
## 2017-07-21 ENCOUNTER — Other Ambulatory Visit: Payer: Self-pay | Admitting: Internal Medicine

## 2017-07-21 ENCOUNTER — Encounter: Payer: Self-pay | Admitting: Internal Medicine

## 2017-07-21 ENCOUNTER — Ambulatory Visit (INDEPENDENT_AMBULATORY_CARE_PROVIDER_SITE_OTHER): Payer: Medicare PPO | Admitting: Internal Medicine

## 2017-07-21 VITALS — BP 138/84 | HR 73 | Temp 97.8°F | Ht 60.0 in | Wt 153.0 lb

## 2017-07-21 DIAGNOSIS — M5416 Radiculopathy, lumbar region: Secondary | ICD-10-CM | POA: Diagnosis not present

## 2017-07-21 DIAGNOSIS — S4380XA Sprain of other specified parts of unspecified shoulder girdle, initial encounter: Secondary | ICD-10-CM | POA: Insufficient documentation

## 2017-07-21 DIAGNOSIS — E119 Type 2 diabetes mellitus without complications: Secondary | ICD-10-CM | POA: Diagnosis not present

## 2017-07-21 DIAGNOSIS — S4381XA Sprain of other specified parts of right shoulder girdle, initial encounter: Secondary | ICD-10-CM

## 2017-07-21 DIAGNOSIS — Z794 Long term (current) use of insulin: Secondary | ICD-10-CM | POA: Diagnosis not present

## 2017-07-21 DIAGNOSIS — I10 Essential (primary) hypertension: Secondary | ICD-10-CM

## 2017-07-21 MED ORDER — HYDROCODONE-ACETAMINOPHEN 10-325 MG PO TABS: 1.0000 | ORAL_TABLET | Freq: Four times a day (QID) | ORAL | 0 refills | Status: DC | PRN

## 2017-07-21 MED ORDER — KETOROLAC TROMETHAMINE 30 MG/ML IJ SOLN
30.0000 mg | Freq: Once | INTRAMUSCULAR | Status: AC
  Administered 2017-07-21: 30 mg via INTRAMUSCULAR

## 2017-07-21 MED ORDER — TIZANIDINE HCL 2 MG PO TABS: 2.0000 mg | ORAL_TABLET | Freq: Four times a day (QID) | ORAL | 0 refills | Status: DC | PRN

## 2017-07-21 MED ORDER — GABAPENTIN 100 MG PO CAPS: 100.0000 mg | ORAL_CAPSULE | Freq: Three times a day (TID) | ORAL | 3 refills | Status: DC
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
## 2017-07-21 NOTE — Patient Instructions (Signed)
You have been determined to have right upper and mid back muscular pain, but you also have a "right lumbar radculopathy" (pinched nerve for some reason with the right lower back_  Please take all new medication as prescribed - the pain medication, muscle relaxer as needed, and gabapentin for nerve pain  Please call in 1 week for further pain medication if needed, and we would also consider increased gabapentin for nerve pain as well  You will be contacted regarding the referral for: MRI for the lower back, and Dr Sharol Given (orthopedic)  Please continue all other medications as before, and refills have been done if requested.  Please have the pharmacy call with any other refills you may need.  Please keep your appointments with your specialists as you may have planned

## 2017-07-25 NOTE — Assessment & Plan Note (Signed)
C/w mod strain - for muscle relaxer as above,  to f/u any worsening symptoms or concerns

## 2017-07-25 NOTE — Assessment & Plan Note (Signed)
stable overall by history and exam, recent data reviewed with pt, and pt to continue medical treatment as before,  to f/u any worsening symptoms or concerns Lab Results  Component Value Date   HGBA1C 6.4 04/06/2017

## 2017-07-25 NOTE — Assessment & Plan Note (Signed)
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

## 2017-07-26 LAB — CUP PACEART REMOTE DEVICE CHECK
Battery Remaining Percentage: 65 %
Brady Statistic AS VP Percent: 96 %
Brady Statistic AS VS Percent: 1 %
Date Time Interrogation Session: 20190506061333
HIGH POWER IMPEDANCE MEASURED VALUE: 68 Ohm
HighPow Impedance: 68 Ohm
Implantable Lead Implant Date: 20100930
Implantable Lead Implant Date: 20100930
Implantable Lead Location: 753858
Implantable Lead Location: 753859
Implantable Lead Location: 753860
Lead Channel Impedance Value: 480 Ohm
Lead Channel Impedance Value: 560 Ohm
Lead Channel Pacing Threshold Amplitude: 0.75 V
Lead Channel Pacing Threshold Amplitude: 0.75 V
Lead Channel Pacing Threshold Amplitude: 1 V
Lead Channel Pacing Threshold Pulse Width: 0.5 ms
Lead Channel Setting Pacing Amplitude: 2 V
Lead Channel Setting Pacing Pulse Width: 0.5 ms
Lead Channel Setting Sensing Sensitivity: 0.5 mV
MDC IDC LEAD IMPLANT DT: 20100930
MDC IDC MSMT BATTERY REMAINING LONGEVITY: 54 mo
MDC IDC MSMT BATTERY VOLTAGE: 2.99 V
MDC IDC MSMT LEADCHNL RA IMPEDANCE VALUE: 330 Ohm
MDC IDC MSMT LEADCHNL RA PACING THRESHOLD PULSEWIDTH: 0.5 ms
MDC IDC MSMT LEADCHNL RA SENSING INTR AMPL: 1.2 mV
MDC IDC MSMT LEADCHNL RV PACING THRESHOLD PULSEWIDTH: 0.5 ms
MDC IDC MSMT LEADCHNL RV SENSING INTR AMPL: 12 mV
MDC IDC PG IMPLANT DT: 20161215
MDC IDC SET LEADCHNL LV PACING AMPLITUDE: 2 V
MDC IDC SET LEADCHNL RV PACING AMPLITUDE: 2.5 V
MDC IDC SET LEADCHNL RV PACING PULSEWIDTH: 0.5 ms
MDC IDC STAT BRADY AP VP PERCENT: 3.7 %
MDC IDC STAT BRADY AP VS PERCENT: 1 %
MDC IDC STAT BRADY RA PERCENT PACED: 3.6 %
Pulse Gen Serial Number: 7247188

## 2017-07-28 ENCOUNTER — Other Ambulatory Visit: Payer: Self-pay | Admitting: Internal Medicine

## 2017-08-01 ENCOUNTER — Other Ambulatory Visit (INDEPENDENT_AMBULATORY_CARE_PROVIDER_SITE_OTHER): Payer: Self-pay | Admitting: Orthopaedic Surgery

## 2017-08-10 ENCOUNTER — Other Ambulatory Visit: Payer: Self-pay | Admitting: Internal Medicine

## 2017-08-10 DIAGNOSIS — Z1231 Encounter for screening mammogram for malignant neoplasm of breast: Secondary | ICD-10-CM

## 2017-08-11 ENCOUNTER — Telehealth (INDEPENDENT_AMBULATORY_CARE_PROVIDER_SITE_OTHER): Payer: Self-pay | Admitting: *Deleted

## 2017-08-11 ENCOUNTER — Telehealth: Payer: Self-pay | Admitting: Internal Medicine

## 2017-08-11 MED ORDER — HYDROCODONE-ACETAMINOPHEN 10-325 MG PO TABS: 1.0000 | ORAL_TABLET | Freq: Four times a day (QID) | ORAL | 0 refills | Status: DC | PRN

## 2017-08-11 NOTE — Telephone Encounter (Signed)
Copied from [place] [id number]. Topic: General - Other >> Aug 10, 2017  4:03 PM Oneta Rack wrote: Relation to pt: self Call back [place]: [id number] [place], [place]. [phone] (Phone) [phone] (Fax)  Reason for call:  Patient was under the impression she was having a CAT scan of back pain and not a MRI due to her having a defibrillator and pacemaker, please advise   Patient requesting HYDROcodone-acetaminophen (Regino Ramirez) 10-325 MG tablet due to back pain, please advise  >> Aug 10, 2017  4:09 PM Oneta Rack wrote: Relation to pt: self Call back [place]: [id number] - [place], [place]. [phone] (Phone) [phone] (Fax)  Reason for call:  Patient was under the impression she was having a CAT scan of back pain and not a MRI due to her having a defibrillator and pacemaker, please advise   Patient requesting HYDROcodone-acetaminophen (NORCO) 10-325 MG tablet due to back pain, please advise

## 2017-08-11 NOTE — Addendum Note (Signed)
Addended by: Biagio Borg on: 08/11/2017 10:29 AM   Modules accepted: Orders

## 2017-08-11 NOTE — Telephone Encounter (Signed)
Done erx 

## 2017-08-11 NOTE — Telephone Encounter (Signed)
Sorry, ok to hold on having the MRI,  Please still see Dr Sharol Given who should be able to tell you a plan from that point.  Thanks

## 2017-08-11 NOTE — Telephone Encounter (Signed)
Pt has been informed and expressed understanding   Norco 07/21/2017  30#  Please advise.

## 2017-08-12 ENCOUNTER — Ambulatory Visit (INDEPENDENT_AMBULATORY_CARE_PROVIDER_SITE_OTHER): Payer: Medicare PPO

## 2017-08-12 ENCOUNTER — Other Ambulatory Visit: Payer: Self-pay | Admitting: Internal Medicine

## 2017-08-12 DIAGNOSIS — I5032 Chronic diastolic (congestive) heart failure: Secondary | ICD-10-CM | POA: Diagnosis not present

## 2017-08-12 DIAGNOSIS — Z95810 Presence of automatic (implantable) cardiac defibrillator: Secondary | ICD-10-CM

## 2017-08-12 NOTE — Telephone Encounter (Signed)
Called pt and left vm#1.

## 2017-08-12 NOTE — Progress Notes (Signed)
EPIC Encounter for ICM Monitoring  Patient Name: Carolyn [name] is a 77 y.o. female Date: 08/12/2017 Primary Care Physican: Biagio Borg, MD Primary Cardiologist: Angelena Form Electrophysiologist: Allred Dry Weight:Previous weight 160lbs Bi-V Pacing: >99%%      Heart Failure questions reviewed, pt asymptomatic.    Thoracic impedance normal but was abnormal suggesting fluid accumulation from 07/27/2017 - 08/02/2017.  Prescribed dosage: Furosemide 20 mg 1tablet daily.   Labs: 04/06/2017 Creatinine 0.89, BUN 13, Potassium 4.2, Sodium 139, EGFR 65.65 09/15/2016 Creatinine 0.84, BUN 9, Potassium 3.8, Sodium 140 03/10/2016 Creatinine 0.76, BUN 12, Potassium 3.6, Sodium 138  Recommendations: No changes.    Encouraged to call for fluid symptoms.  Follow-up plan: ICM clinic phone appointment on 09/13/2017.   Copy of ICM check sent to Dr. Rayann Heman.   3 month ICM trend: 08/12/2017    1 Year ICM trend:       Rosalene Billings, RN 08/12/2017 12:52 PM
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
## 2017-08-16 NOTE — Telephone Encounter (Signed)
Pt scheduled for 7/10

## 2017-08-21 ENCOUNTER — Other Ambulatory Visit: Payer: Self-pay | Admitting: Internal Medicine

## 2017-08-23 NOTE — Telephone Encounter (Signed)
Done erx 

## 2017-08-31 ENCOUNTER — Ambulatory Visit
Admission: RE | Admit: 2017-08-31 | Discharge: 2017-08-31 | Disposition: A | Discharge status: Home / Self Care | Payer: Medicare PPO | Source: Ambulatory Visit | Attending: Internal Medicine | Admitting: Internal Medicine

## 2017-08-31 DIAGNOSIS — Z1231 Encounter for screening mammogram for malignant neoplasm of breast: Secondary | ICD-10-CM | POA: Diagnosis not present

## 2017-09-13 ENCOUNTER — Ambulatory Visit (INDEPENDENT_AMBULATORY_CARE_PROVIDER_SITE_OTHER): Payer: Medicare PPO

## 2017-09-13 DIAGNOSIS — Z95810 Presence of automatic (implantable) cardiac defibrillator: Secondary | ICD-10-CM | POA: Diagnosis not present

## 2017-09-13 DIAGNOSIS — I5032 Chronic diastolic (congestive) heart failure: Secondary | ICD-10-CM

## 2017-09-14 NOTE — Progress Notes (Signed)
EPIC Encounter for ICM Monitoring  Patient Name: Carolyn [name] is a 77 y.o. female Date: 09/14/2017 Primary Care Physican: Biagio Borg, MD Primary Cardiologist: Angelena Form Electrophysiologist: Allred Dry Weight:Previous weight 160lbs Bi-V Pacing: >99%%       Heart Failure questions reviewed, pt asymptomatic.   Thoracic impedance normal.  Prescribed dosage: Furosemide 20 mg 1tablet daily.   Labs: 04/06/2017 Creatinine 0.89, BUN 13, Potassium 4.2, Sodium 139, EGFR 65.65 09/15/2016 Creatinine 0.84, BUN 9, Potassium 3.8, Sodium 140 03/10/2016 Creatinine 0.76, BUN 12, Potassium 3.6, Sodium 138  Recommendations: No changes.  Encouraged to call for fluid symptoms.  Follow-up plan: ICM clinic phone appointment on 10/14/2017.    Advised she is due to make an appointment with Chanetta Marshall, NP (was due April 2019)  Copy of ICM check sent to Dr. Rayann Heman.   3 month ICM trend: 09/13/2017    1 Year ICM trend:       Rosalene Billings, RN 09/14/2017 10:12 AM

## 2017-09-15 ENCOUNTER — Ambulatory Visit (INDEPENDENT_AMBULATORY_CARE_PROVIDER_SITE_OTHER): Payer: Medicare PPO | Admitting: Physical Medicine and Rehabilitation

## 2017-09-15 ENCOUNTER — Encounter (INDEPENDENT_AMBULATORY_CARE_PROVIDER_SITE_OTHER): Payer: Self-pay | Admitting: Physical Medicine and Rehabilitation

## 2017-09-15 VITALS — BP 124/71 | HR 78 | Temp 98.5°F | Ht 60.0 in | Wt 149.0 lb

## 2017-09-15 DIAGNOSIS — M47816 Spondylosis without myelopathy or radiculopathy, lumbar region: Secondary | ICD-10-CM

## 2017-09-15 DIAGNOSIS — M545 Low back pain: Secondary | ICD-10-CM

## 2017-09-15 DIAGNOSIS — G8929 Other chronic pain: Secondary | ICD-10-CM

## 2017-09-15 DIAGNOSIS — M7918 Myalgia, other site: Secondary | ICD-10-CM

## 2017-09-15 DIAGNOSIS — S39012A Strain of muscle, fascia and tendon of lower back, initial encounter: Secondary | ICD-10-CM

## 2017-09-15 NOTE — Progress Notes (Signed)
 .  Numeric Pain Rating Scale and Functional Assessment Average Pain 9 Pain Right Now 6 My pain is constant, sharp and stabbing Pain is worse with: standing and some activites Pain improves with: rest and medication   In the last MONTH (on 0-10 scale) has pain interfered with the following?  1. General activity like being  able to carry out your everyday physical activities such as walking, climbing stairs, carrying groceries, or moving a chair?  Rating(6)  2. Relation with others like being able to carry out your usual social activities and roles such as  activities at home, at work and in your community. Rating(5)  3. Enjoyment of life such that you have  been bothered by emotional problems such as feeling anxious, depressed or irritable?  Rating(9)

## 2017-09-16 ENCOUNTER — Encounter (INDEPENDENT_AMBULATORY_CARE_PROVIDER_SITE_OTHER): Payer: Self-pay | Admitting: Physical Medicine and Rehabilitation

## 2017-09-16 ENCOUNTER — Other Ambulatory Visit: Payer: Self-pay | Admitting: Internal Medicine

## 2017-09-16 DIAGNOSIS — S39012A Strain of muscle, fascia and tendon of lower back, initial encounter: Secondary | ICD-10-CM

## 2017-09-16 DIAGNOSIS — M7918 Myalgia, other site: Secondary | ICD-10-CM

## 2017-09-16 MED ORDER — LIDOCAINE HCL 1 % IJ SOLN
1.0000 mL | INTRAMUSCULAR | Status: AC | PRN
  Administered 2017-09-16: 1 mL

## 2017-09-16 MED ORDER — LIDOCAINE HCL (PF) 1 % IJ SOLN
1.0000 mL | INTRAMUSCULAR | Status: AC | PRN
  Administered 2017-09-16: 1 mL

## 2017-09-16 MED ORDER — TRIAMCINOLONE ACETONIDE 40 MG/ML IJ SUSP
10.0000 mg | INTRAMUSCULAR | Status: AC | PRN
  Administered 2017-09-16: 10 mg via INTRAMUSCULAR
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
## 2017-09-20 ENCOUNTER — Other Ambulatory Visit: Payer: Self-pay | Admitting: Internal Medicine

## 2017-09-29 ENCOUNTER — Other Ambulatory Visit: Payer: Self-pay | Admitting: Internal Medicine

## 2017-09-29 DIAGNOSIS — I10 Essential (primary) hypertension: Secondary | ICD-10-CM

## 2017-09-29 NOTE — Telephone Encounter (Signed)
Copied from [place] [id number]. Topic: Quick Communication - Rx Refill/Question >> Sep 29, 2017  9:47 AM Mylinda Latina, NT wrote: Medication: HYDROcodone-acetaminophen ([place]) 10-325 MG tablet  Has the patient contacted their pharmacy? Yes.  Was told that she had to call office  (Agent: If no, request that the patient contact the pharmacy for the refill.) (Agent: If yes, when and what did the pharmacy advise?)  Preferred Pharmacy (with phone number or street name): [id number] [address], [place]. [phone] (Phone) [phone] (Fax)      Agent: Please be advised that RX refills may take up to 3 business days. We ask that you follow-up with your pharmacy.

## 2017-09-30 MED ORDER — HYDROCODONE-ACETAMINOPHEN 10-325 MG PO TABS: 1.0000 | ORAL_TABLET | Freq: Four times a day (QID) | ORAL | 0 refills | Status: DC | PRN

## 2017-09-30 NOTE — Telephone Encounter (Signed)
Done erx 

## 2017-09-30 NOTE — Telephone Encounter (Signed)
LOV 07/21/17 Dr. Jenny Reichmann Last refill 08/11/17 # 30 with 0 refill

## 2017-10-04 ENCOUNTER — Ambulatory Visit (INDEPENDENT_AMBULATORY_CARE_PROVIDER_SITE_OTHER): Payer: Medicare PPO | Admitting: Internal Medicine

## 2017-10-04 ENCOUNTER — Other Ambulatory Visit (INDEPENDENT_AMBULATORY_CARE_PROVIDER_SITE_OTHER): Payer: Medicare PPO

## 2017-10-04 ENCOUNTER — Encounter: Payer: Self-pay | Admitting: Internal Medicine

## 2017-10-04 VITALS — BP 140/84 | HR 72 | Temp 98.0°F | Ht 60.0 in | Wt 152.0 lb

## 2017-10-04 DIAGNOSIS — Z794 Long term (current) use of insulin: Secondary | ICD-10-CM | POA: Diagnosis not present

## 2017-10-04 DIAGNOSIS — E119 Type 2 diabetes mellitus without complications: Secondary | ICD-10-CM

## 2017-10-04 DIAGNOSIS — I10 Essential (primary) hypertension: Secondary | ICD-10-CM

## 2017-10-04 DIAGNOSIS — E785 Hyperlipidemia, unspecified: Secondary | ICD-10-CM

## 2017-10-04 LAB — BASIC METABOLIC PANEL
BUN: 26 mg/dL — AB (ref 6–23)
CALCIUM: 9.6 mg/dL (ref 8.4–10.5)
CO2: 28 mEq/L (ref 19–32)
Chloride: 104 mEq/L (ref 96–112)
Creatinine, Ser: 1.05 mg/dL (ref 0.40–1.20)
GFR: 54.17 mL/min — AB (ref >60.00)
Glucose, Bld: 91 mg/dL (ref 70–99)
POTASSIUM: 4.6 meq/L (ref 3.5–5.1)
Sodium: 140 mEq/L (ref 135–145)

## 2017-10-04 LAB — HEPATIC FUNCTION PANEL
ALT: 11 U/L (ref 0–35)
AST: 13 U/L (ref 0–37)
Albumin: 4.1 g/dL (ref 3.5–5.2)
Alkaline Phosphatase: 38 U/L — ABNORMAL LOW (ref 39–117)
BILIRUBIN DIRECT: 0 mg/dL (ref 0.0–0.3)
TOTAL PROTEIN: 7.1 g/dL (ref 6.0–8.3)
Total Bilirubin: 0.4 mg/dL (ref 0.2–1.2)

## 2017-10-04 LAB — LIPID PANEL
CHOLESTEROL: 139 mg/dL (ref 0–200)
HDL: 49.4 mg/dL (ref >39.00)
LDL Cholesterol: 72 mg/dL (ref 0–99)
NONHDL: 89.23
Total CHOL/HDL Ratio: 3
Triglycerides: 85 mg/dL (ref 0.0–149.0)
VLDL: 17 mg/dL (ref 0.0–40.0)

## 2017-10-04 LAB — HEMOGLOBIN A1C: HEMOGLOBIN A1C: 6.4 % (ref 4.6–6.5)
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
## 2017-10-04 NOTE — Assessment & Plan Note (Signed)
Lab Results  Component Value Date   LDLCALC 72 10/04/2017   stable overall by history and exam, recent data reviewed with pt, and pt to continue medical treatment as before,  to f/u any worsening symptoms or concerns

## 2017-10-04 NOTE — Assessment & Plan Note (Signed)
Lab Results  Component Value Date   HGBA1C 6.4 10/04/2017   stable overall by history and exam, recent data reviewed with pt, and pt to continue medical treatment as before,  to f/u any worsening symptoms or concerns]

## 2017-10-04 NOTE — Assessment & Plan Note (Signed)
stable overall by history and exam, recent data reviewed with pt, and pt to continue medical treatment as before,  to f/u any worsening symptoms or concerns BP Readings from Last 3 Encounters:  10/04/17 140/84  09/15/17 124/71  07/21/17 138/84

## 2017-10-04 NOTE — Patient Instructions (Signed)
Please continue all other medications as before, and refills have been done if requested.  Please have the pharmacy call with any other refills you may need.  Please continue your efforts at being more active, low cholesterol diet, and weight control..  Please keep your appointments with your specialists as you may have planned  Please return in 6 months, or sooner if needed, with Lab testing done 3-5 days before  

## 2017-10-09 ENCOUNTER — Other Ambulatory Visit: Payer: Self-pay | Admitting: Internal Medicine

## 2017-10-14 ENCOUNTER — Ambulatory Visit: Payer: Medicare PPO | Admitting: *Deleted

## 2017-10-14 ENCOUNTER — Telehealth: Payer: Self-pay

## 2017-10-14 DIAGNOSIS — I428 Other cardiomyopathies: Secondary | ICD-10-CM

## 2017-10-14 DIAGNOSIS — I5032 Chronic diastolic (congestive) heart failure: Secondary | ICD-10-CM | POA: Diagnosis not present

## 2017-10-14 NOTE — Telephone Encounter (Signed)
Spoke with pt and reminded pt of remote transmission that is due today. Pt verbalized understanding.   

## 2017-10-18 NOTE — Progress Notes (Signed)
Remote ICD transmission.   

## 2017-10-19 ENCOUNTER — Other Ambulatory Visit: Payer: Self-pay | Admitting: Internal Medicine

## 2017-10-19 NOTE — Telephone Encounter (Signed)
Done erx 

## 2017-11-01 ENCOUNTER — Encounter: Payer: Self-pay | Admitting: Internal Medicine

## 2017-11-01 ENCOUNTER — Ambulatory Visit (INDEPENDENT_AMBULATORY_CARE_PROVIDER_SITE_OTHER): Payer: Medicare PPO | Admitting: Internal Medicine

## 2017-11-01 VITALS — BP 110/68 | HR 69 | Ht 60.0 in | Wt 155.4 lb

## 2017-11-01 DIAGNOSIS — Z95810 Presence of automatic (implantable) cardiac defibrillator: Secondary | ICD-10-CM

## 2017-11-01 DIAGNOSIS — I5032 Chronic diastolic (congestive) heart failure: Secondary | ICD-10-CM

## 2017-11-01 DIAGNOSIS — I428 Other cardiomyopathies: Secondary | ICD-10-CM

## 2017-11-01 NOTE — Patient Instructions (Addendum)
Medication Instructions:  Your physician recommends that you continue on your current medications as directed. Please refer to the Current Medication list given to you today.  *If you need a refill on your cardiac medications before your next appointment, please call your pharmacy*  Labwork: None ordered  Testing/Procedures: None ordered  Follow-Up: Remote monitoring is used to monitor your Pacemaker or ICD from home. This monitoring reduces the number of office visits required to check your device to one time per year. It allows [place] to keep an eye on the functioning of your device to ensure it is working properly. You are scheduled for ICM clinic remote monitoring on  12/06/2017 & 01/17/2018.  You are scheduled for a device check from home on 01/17/2018. You may send your transmission at any time that day. If you have a wireless device, the transmission will be sent automatically. After your physician reviews your transmission, you will receive a postcard with your next transmission date.  Your physician wants you to follow-up in: 1 year with Tommye Standard, PA-C.  You will receive a reminder letter in the mail two months in advance. If you don't receive a letter, please call our office to schedule the follow-up appointment.  Thank you for choosing [facility]!!
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
## 2017-11-02 LAB — CUP PACEART INCLINIC DEVICE CHECK
Battery Remaining Longevity: 52 mo
Brady Statistic RA Percent Paced: 3.6 %
Date Time Interrogation Session: 20190826202358
HighPow Impedance: 71 Ohm
Implantable Lead Implant Date: 20100930
Implantable Lead Implant Date: 20100930
Implantable Lead Location: 753859
Lead Channel Impedance Value: 350 Ohm
Lead Channel Impedance Value: 612.5 Ohm
Lead Channel Pacing Threshold Amplitude: 1 V
Lead Channel Pacing Threshold Pulse Width: 0.5 ms
Lead Channel Sensing Intrinsic Amplitude: 1.5 mV
Lead Channel Sensing Intrinsic Amplitude: 11.6 mV
Lead Channel Setting Pacing Amplitude: 2 V
Lead Channel Setting Pacing Amplitude: 2 V
Lead Channel Setting Pacing Pulse Width: 0.5 ms
Lead Channel Setting Sensing Sensitivity: 0.5 mV
MDC IDC LEAD IMPLANT DT: 20100930
MDC IDC LEAD LOCATION: 753858
MDC IDC LEAD LOCATION: 753860
MDC IDC MSMT LEADCHNL LV IMPEDANCE VALUE: 512.5 Ohm
MDC IDC MSMT LEADCHNL LV PACING THRESHOLD PULSEWIDTH: 0.5 ms
MDC IDC MSMT LEADCHNL RA PACING THRESHOLD AMPLITUDE: 0.75 V
MDC IDC MSMT LEADCHNL RA PACING THRESHOLD PULSEWIDTH: 0.5 ms
MDC IDC MSMT LEADCHNL RV PACING THRESHOLD AMPLITUDE: 0.75 V
MDC IDC PG IMPLANT DT: 20161215
MDC IDC SET LEADCHNL RV PACING AMPLITUDE: 2.5 V
MDC IDC SET LEADCHNL RV PACING PULSEWIDTH: 0.5 ms
Pulse Gen Serial Number: 7247188

## 2017-11-02 NOTE — Addendum Note (Signed)
Addended by: Rose Phi on: 11/02/2017 01:55 PM   Modules accepted: Orders

## 2017-11-08 ENCOUNTER — Emergency Department (HOSPITAL_COMMUNITY)
Admission: EM | Admit: 2017-11-08 | Discharge: 2017-11-08 | Disposition: A | Discharge status: Home / Self Care | Payer: Medicare PPO | Attending: Emergency Medicine | Admitting: Emergency Medicine

## 2017-11-08 ENCOUNTER — Emergency Department (HOSPITAL_COMMUNITY): Payer: Medicare PPO

## 2017-11-08 ENCOUNTER — Encounter (HOSPITAL_COMMUNITY): Payer: Self-pay

## 2017-11-08 DIAGNOSIS — Z7982 Long term (current) use of aspirin: Secondary | ICD-10-CM | POA: Diagnosis not present

## 2017-11-08 DIAGNOSIS — E119 Type 2 diabetes mellitus without complications: Secondary | ICD-10-CM | POA: Insufficient documentation

## 2017-11-08 DIAGNOSIS — Z794 Long term (current) use of insulin: Secondary | ICD-10-CM | POA: Insufficient documentation

## 2017-11-08 DIAGNOSIS — I5022 Chronic systolic (congestive) heart failure: Secondary | ICD-10-CM | POA: Insufficient documentation

## 2017-11-08 DIAGNOSIS — I959 Hypotension, unspecified: Secondary | ICD-10-CM | POA: Diagnosis not present

## 2017-11-08 DIAGNOSIS — I110 Hypertensive heart disease with heart failure: Secondary | ICD-10-CM | POA: Diagnosis not present

## 2017-11-08 DIAGNOSIS — M7918 Myalgia, other site: Secondary | ICD-10-CM

## 2017-11-08 DIAGNOSIS — F17210 Nicotine dependence, cigarettes, uncomplicated: Secondary | ICD-10-CM | POA: Diagnosis not present

## 2017-11-08 DIAGNOSIS — Z79899 Other long term (current) drug therapy: Secondary | ICD-10-CM | POA: Insufficient documentation

## 2017-11-08 DIAGNOSIS — E039 Hypothyroidism, unspecified: Secondary | ICD-10-CM | POA: Insufficient documentation

## 2017-11-08 DIAGNOSIS — R0789 Other chest pain: Secondary | ICD-10-CM | POA: Diagnosis not present

## 2017-11-08 DIAGNOSIS — R0602 Shortness of breath: Secondary | ICD-10-CM | POA: Diagnosis not present

## 2017-11-08 LAB — BRAIN NATRIURETIC PEPTIDE: B NATRIURETIC PEPTIDE 5: 250.3 pg/mL — AB (ref 0.0–100.0)

## 2017-11-08 LAB — COMPREHENSIVE METABOLIC PANEL
ALBUMIN: 3.5 g/dL (ref 3.5–5.0)
ALK PHOS: 48 U/L (ref 38–126)
ALT: 17 U/L (ref 0–44)
AST: 21 U/L (ref 15–41)
Anion gap: 8 (ref 5–15)
BUN: 15 mg/dL (ref 8–23)
CALCIUM: 8.9 mg/dL (ref 8.9–10.3)
CO2: 29 mmol/L (ref 22–32)
CREATININE: 0.92 mg/dL (ref 0.44–1.00)
Chloride: 103 mmol/L (ref 98–111)
GFR, EST NON AFRICAN AMERICAN: 59 mL/min — AB (ref >60)
Glucose, Bld: 108 mg/dL — ABNORMAL HIGH (ref 70–99)
Potassium: 4 mmol/L (ref 3.5–5.1)
Sodium: 140 mmol/L (ref 135–145)
TOTAL PROTEIN: 6.5 g/dL (ref 6.5–8.1)
Total Bilirubin: 0.7 mg/dL (ref 0.3–1.2)

## 2017-11-08 LAB — CBC WITH DIFFERENTIAL/PLATELET
Abs Immature Granulocytes: 0.1 10*3/uL (ref 0.0–0.1)
BASOS ABS: 0.1 10*3/uL (ref 0.0–0.1)
Basophils Relative: 1 %
EOS ABS: 0.2 10*3/uL (ref 0.0–0.7)
EOS PCT: 1 %
HCT: 36.4 % (ref 36.0–46.0)
Hemoglobin: 11.7 g/dL — ABNORMAL LOW (ref 12.0–15.0)
Immature Granulocytes: 0 %
Lymphocytes Relative: 24 %
Lymphs Abs: 2.8 10*3/uL (ref 0.7–4.0)
MCH: 29.6 pg (ref 26.0–34.0)
MCHC: 32.1 g/dL (ref 30.0–36.0)
MCV: 92.2 fL (ref 78.0–100.0)
MONO ABS: 1 10*3/uL (ref 0.1–1.0)
Monocytes Relative: 9 %
Neutro Abs: 7.4 10*3/uL (ref 1.7–7.7)
Neutrophils Relative %: 65 %
Platelets: 217 10*3/uL (ref 150–400)
RBC: 3.95 MIL/uL (ref 3.87–5.11)
RDW: 14.6 % (ref 11.5–15.5)
WBC: 11.5 10*3/uL — AB (ref 4.0–10.5)

## 2017-11-08 LAB — I-STAT TROPONIN, ED: TROPONIN I, POC: 0.02 ng/mL (ref 0.00–0.08)

## 2017-11-08 MED ORDER — DEXAMETHASONE 4 MG PO TABS
10.0000 mg | ORAL_TABLET | Freq: Once | ORAL | Status: AC
  Administered 2017-11-08: 10 mg via ORAL
  Filled 2017-11-08: qty 3

## 2017-11-08 MED ORDER — DIPHENHYDRAMINE HCL 25 MG PO CAPS
25.0000 mg | ORAL_CAPSULE | Freq: Once | ORAL | Status: AC
  Administered 2017-11-08: 25 mg via ORAL
  Filled 2017-11-08: qty 1

## 2017-11-08 MED ORDER — CYCLOBENZAPRINE HCL 10 MG PO TABS
10.0000 mg | ORAL_TABLET | Freq: Once | ORAL | Status: AC
  Administered 2017-11-08: 10 mg via ORAL
  Filled 2017-11-08: qty 1

## 2017-11-08 NOTE — ED Notes (Signed)
Patient transported to X-ray 

## 2017-11-08 NOTE — ED Notes (Signed)
Pt ambulated to bathroom without difficulty.

## 2017-11-08 NOTE — ED Triage Notes (Signed)
Pt arrived via [facility] from home c/o intermittent SOB and left shoulder blade pain for a couple days.  Arrives with paced rhythm denies chest pain.
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
## 2017-11-08 NOTE — ED Notes (Signed)
Pt stable, ambulatory, states understanding of discharge instructions 

## 2017-11-09 ENCOUNTER — Telehealth: Payer: Self-pay | Admitting: Internal Medicine

## 2017-11-09 NOTE — Telephone Encounter (Signed)
Patient was triaged by [facility] on 9/2 at 11:40am.  Patient states she has been coughing and sneezing for a couple days.  States she has coughed up something looking like "raw oysters".  States her ears are stopped up and has a sore throat.  Also is short of breath and hurts in her back around her shoulder blades.  States she had been exposed to pneumonia in the last week.  Also, states she is having a hard time remembering things.  Patient was advised to call 911 right away.  Looks like she did have a ED visit on 9/2.

## 2017-11-11 NOTE — Progress Notes (Signed)
Transmission not received, billed in error. Charges deleted.

## 2017-11-15 ENCOUNTER — Telehealth: Payer: Self-pay | Admitting: Internal Medicine

## 2017-11-15 MED ORDER — HYDROCODONE-ACETAMINOPHEN 10-325 MG PO TABS: 1.0000 | ORAL_TABLET | Freq: Four times a day (QID) | ORAL | 0 refills | Status: DC | PRN

## 2017-11-15 NOTE — Telephone Encounter (Signed)
[place] Controlled Substance Database checked. Last filled on 0725/19  Last OV 10/04/17

## 2017-11-15 NOTE — Telephone Encounter (Signed)
Copied from [place] [id number]. Topic: Quick Communication - See Telephone Encounter >> Nov 15, 2017  5:01 PM Vernona Rieger wrote: CRM for notification. See Telephone encounter for: 11/15/17.  [facility] about [facility] HYDROcodone-acetaminophen (NORCO) 10-325 MG tablet & ALPRAZolam (XANAX) 1 MG tablet. She needs something documented that she is taking both opioids for medicare purpose.   Fax number [phone]

## 2017-11-15 NOTE — Telephone Encounter (Signed)
Done erx 

## 2017-11-15 NOTE — Telephone Encounter (Signed)
Copied from [place] [id number]. Topic: Quick Communication - Rx Refill/Question >> Nov 15, 2017  1:11 PM Synthia Innocent wrote: Medication: HYDROcodone-acetaminophen ([place]) 10-325 MG tablet   Has the patient contacted their pharmacy? Yes.   (Agent: If no, request that the patient contact the pharmacy for the refill.) (Agent: If yes, when and what did the pharmacy advise?)  [facility]erred Pharm[facility]with phone number [facility] [address]  Agent: Please be advised that RX refills may take up to 3 business days. We ask that you follow-up with your pharmacy.

## 2017-11-16 NOTE — Telephone Encounter (Signed)
Spoke with patient, she will follow up with pharmacy.

## 2017-11-16 NOTE — Telephone Encounter (Signed)
This is an odd request  I need:    1) clarification of who exactly wants this 2) under what regulation or law is this needed 3) to whom to address any letter  Otherwise I see no need for this letter

## 2017-11-29 ENCOUNTER — Other Ambulatory Visit: Payer: Self-pay | Admitting: Internal Medicine

## 2017-11-29 DIAGNOSIS — H353122 Nonexudative age-related macular degeneration, left eye, intermediate dry stage: Secondary | ICD-10-CM | POA: Diagnosis not present

## 2017-11-29 DIAGNOSIS — E119 Type 2 diabetes mellitus without complications: Secondary | ICD-10-CM | POA: Diagnosis not present

## 2017-11-29 DIAGNOSIS — H31011 Macula scars of posterior pole (postinflammatory) (post-traumatic), right eye: Secondary | ICD-10-CM | POA: Diagnosis not present

## 2017-11-29 DIAGNOSIS — H401411 Capsular glaucoma with pseudoexfoliation of lens, right eye, mild stage: Secondary | ICD-10-CM | POA: Diagnosis not present

## 2017-11-29 DIAGNOSIS — H2513 Age-related nuclear cataract, bilateral: Secondary | ICD-10-CM | POA: Diagnosis not present

## 2017-11-29 LAB — HM DIABETES EYE EXAM

## 2017-11-29 NOTE — Telephone Encounter (Signed)
Routing to dr john, please advise, thanks 

## 2017-11-29 NOTE — Telephone Encounter (Signed)
Done erx 

## 2017-11-30 ENCOUNTER — Ambulatory Visit: Payer: Self-pay

## 2017-11-30 MED ORDER — PROMETHAZINE HCL 25 MG RE SUPP: 25.0000 mg | Freq: Four times a day (QID) | RECTAL | 0 refills | Status: DC | PRN

## 2017-11-30 NOTE — Telephone Encounter (Signed)
Pt. Reports she went to the eye doctor yesterday and when he dilated her eyes, she started having nausea and vomiting on her way home. Has had some diarrhea - 3 times since yesterday. States she has never had her eyes dilated before. Feels it was "what the put in my eyes". Reports she called her eye Doctor, and he stated the eye drops did not cause her vomiting and diarrhea. Pt. Not convinced. Requests Phenergan suppositories be called in to [facility].Also would like her Hydrocodone refilled as well. Please advise pt. Answer Assessment - Initial Assessment Questions 1. VOMITING SEVERITY: "How many times have you vomited in the past 24 hours?"     - MILD:  1 - 2 times/day    - MODERATE: 3 - 5 times/day, decreased oral intake without significant weight loss or symptoms of dehydration    - SEVERE: 6 or more times/day, vomits everything or nearly everything, with significant weight loss, symptoms of dehydration      6 2. ONSET: "When did the vomiting begin?"       Started after she had her eyes dilated  3. FLUIDS: "What fluids or food have you vomited up today?" "Have you been able to keep any fluids down?"     Not keeping fluids 4. ABDOMINAL PAIN: "Are your having any abdominal pain?" If yes : "How bad is it and what does it feel like?" (e.g., crampy, dull, intermittent, constant)      Having gas 5. DIARRHEA: "Is there any diarrhea?" If so, ask: "How many times today?"      4-5 6. CONTACTS: "Is there anyone else in the family with the same symptoms?"      No 7. CAUSE: "What do you think is causing your vomiting?"     The eye drops the eye doctor used yesterday 8. HYDRATION STATUS: "Any signs of dehydration?" (e.g., dry mouth [not only dry lips], too weak to stand) "When did you last urinate?"     No 9. OTHER SYMPTOMS: "Do you have any other symptoms?" (e.g., fever, headache, vertigo, vomiting blood or coffee grounds, recent head injury)     No 10. PREGNANCY: "Is there any chance you are  pregnant?" "When was your last menstrual period?"       No  Protocols used: [facility]

## 2017-11-30 NOTE — Telephone Encounter (Signed)
Ok for phenergan suppositories but I am not comfortable with ongoing pain medicine refills as I do not treat chronic pain

## 2017-12-01 MED ORDER — PROMETHAZINE HCL 25 MG RE SUPP: 25.0000 mg | Freq: Four times a day (QID) | RECTAL | 0 refills | Status: DC | PRN

## 2017-12-01 NOTE — Telephone Encounter (Addendum)
Left detailed message informing pt of MD's advisement below.

## 2017-12-01 NOTE — Addendum Note (Signed)
Addended by: Cresenciano Lick on: 12/01/2017 09:24 AM   Modules accepted: Orders

## 2017-12-06 ENCOUNTER — Ambulatory Visit (INDEPENDENT_AMBULATORY_CARE_PROVIDER_SITE_OTHER): Payer: Medicare PPO

## 2017-12-06 DIAGNOSIS — Z95810 Presence of automatic (implantable) cardiac defibrillator: Secondary | ICD-10-CM

## 2017-12-06 DIAGNOSIS — I5032 Chronic diastolic (congestive) heart failure: Secondary | ICD-10-CM | POA: Diagnosis not present

## 2017-12-07 ENCOUNTER — Telehealth: Payer: Self-pay

## 2017-12-07 ENCOUNTER — Encounter (INDEPENDENT_AMBULATORY_CARE_PROVIDER_SITE_OTHER): Payer: Medicare PPO | Admitting: Ophthalmology

## 2017-12-07 DIAGNOSIS — H43813 Vitreous degeneration, bilateral: Secondary | ICD-10-CM | POA: Diagnosis not present

## 2017-12-07 DIAGNOSIS — I10 Essential (primary) hypertension: Secondary | ICD-10-CM

## 2017-12-07 DIAGNOSIS — H353122 Nonexudative age-related macular degeneration, left eye, intermediate dry stage: Secondary | ICD-10-CM | POA: Diagnosis not present

## 2017-12-07 DIAGNOSIS — H2513 Age-related nuclear cataract, bilateral: Secondary | ICD-10-CM

## 2017-12-07 DIAGNOSIS — H35033 Hypertensive retinopathy, bilateral: Secondary | ICD-10-CM

## 2017-12-07 DIAGNOSIS — H353211 Exudative age-related macular degeneration, right eye, with active choroidal neovascularization: Secondary | ICD-10-CM

## 2017-12-07 NOTE — Telephone Encounter (Signed)
Remote ICM transmission received.  Attempted call to patient and no answer   

## 2017-12-07 NOTE — Progress Notes (Signed)
EPIC Encounter for ICM Monitoring  Patient Name: Carolyn [name] is a 77 y.o. female Date: 12/07/2017 Primary Care Physican: Biagio Borg, MD Primary Cardiologist: Angelena Form Electrophysiologist: Allred Dry Weight:Previous weight160lbs Bi-V Pacing: >99%%      Attempted call to patient and unable to reach.    Transmission reviewed.    Thoracic impedance normal.  Prescribed: Furosemide 20 mg 1tablet daily.   Labs: 11/08/2017 Creatinine 0.92, BUN 15, Potassium 4.0, Sodium 140 10/04/2017 Creatinine 1.05, BUN 26, Potassium 4.6, Sodium 140, EGFR 54.17 04/06/2017 Creatinine 0.89, BUN 13, Potassium 4.2, Sodium 139, EGFR 65.65 09/15/2016 Creatinine 0.84, BUN 9, Potassium 3.8, Sodium 140 03/10/2016 Creatinine 0.76, BUN 12, Potassium 3.6, Sodium 138  Recommendations:  Unable to reach.  Follow-up plan: ICM clinic phone appointment on 01/17/2018.   Office appointment scheduled 12/31/2017 with Dr. Angelena Form.    Copy of ICM check sent to Dr. Rayann Heman.   3 month ICM trend: 12/06/2017    1 Year ICM trend:       Rosalene Billings, RN 12/07/2017 1:28 PM

## 2017-12-15 ENCOUNTER — Other Ambulatory Visit: Payer: Self-pay | Admitting: Cardiology

## 2017-12-16 ENCOUNTER — Other Ambulatory Visit: Payer: Self-pay | Admitting: Internal Medicine

## 2017-12-31 ENCOUNTER — Ambulatory Visit (INDEPENDENT_AMBULATORY_CARE_PROVIDER_SITE_OTHER): Payer: Medicare PPO | Admitting: Cardiovascular Disease

## 2017-12-31 ENCOUNTER — Encounter: Payer: Self-pay | Admitting: Cardiovascular Disease

## 2017-12-31 VITALS — BP 140/80 | HR 71 | Ht 60.0 in | Wt 164.0 lb

## 2017-12-31 DIAGNOSIS — I428 Other cardiomyopathies: Secondary | ICD-10-CM

## 2017-12-31 DIAGNOSIS — I10 Essential (primary) hypertension: Secondary | ICD-10-CM | POA: Diagnosis not present

## 2017-12-31 DIAGNOSIS — I519 Heart disease, unspecified: Secondary | ICD-10-CM | POA: Diagnosis not present

## 2017-12-31 NOTE — Patient Instructions (Signed)
Medication Instructions:  Your physician recommends that you continue on your current medications as directed. Please refer to the Current Medication list given to you today.  If you need a refill on your cardiac medications before your next appointment, please call your pharmacy.   Lab work: none If you have labs (blood work) drawn today and your tests are completely normal, you will receive your results only by: Marland Kitchen MyChart Message (if you have MyChart) OR . A paper copy in the mail If you have any lab test that is abnormal or we need to change your treatment, we will call you to review the results.  Testing/Procedures: none  Follow-Up: At [facility], you and your health needs are our priority.  As part of our continuing mission to provide you with exceptional heart care, we have created designated Provider Care Teams.  These Care Teams include your primary Cardiologist (physician) and Advanced Practice Providers (APPs -  Physician Assistants and Nurse Practitioners) who all work together to provide you with the care you need, when you need it. You will need a follow up appointment in 12 months.  Please call our office 2 months in advance to schedule this appointment.  You may see Dr. Angelena Form or one of the following Advanced Practice Providers on your designated Care Team:   [place], PA-C Melina Copa, PA-C . Ermalinda Barrios, PA-C  Any Other Special Instructions Will Be Listed Below (If Applicable).
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
## 2018-01-03 ENCOUNTER — Encounter (INDEPENDENT_AMBULATORY_CARE_PROVIDER_SITE_OTHER): Payer: Medicare PPO | Admitting: Ophthalmology

## 2018-01-03 DIAGNOSIS — H353211 Exudative age-related macular degeneration, right eye, with active choroidal neovascularization: Secondary | ICD-10-CM

## 2018-01-03 DIAGNOSIS — H2513 Age-related nuclear cataract, bilateral: Secondary | ICD-10-CM

## 2018-01-03 DIAGNOSIS — H353122 Nonexudative age-related macular degeneration, left eye, intermediate dry stage: Secondary | ICD-10-CM

## 2018-01-03 DIAGNOSIS — H35033 Hypertensive retinopathy, bilateral: Secondary | ICD-10-CM

## 2018-01-03 DIAGNOSIS — H43813 Vitreous degeneration, bilateral: Secondary | ICD-10-CM

## 2018-01-03 DIAGNOSIS — I10 Essential (primary) hypertension: Secondary | ICD-10-CM

## 2018-01-17 ENCOUNTER — Telehealth: Payer: Self-pay | Admitting: Cardiology

## 2018-01-17 ENCOUNTER — Ambulatory Visit (INDEPENDENT_AMBULATORY_CARE_PROVIDER_SITE_OTHER): Payer: Medicare PPO

## 2018-01-17 ENCOUNTER — Ambulatory Visit (INDEPENDENT_AMBULATORY_CARE_PROVIDER_SITE_OTHER): Payer: Medicare PPO | Admitting: *Deleted

## 2018-01-17 DIAGNOSIS — I5032 Chronic diastolic (congestive) heart failure: Secondary | ICD-10-CM | POA: Diagnosis not present

## 2018-01-17 DIAGNOSIS — Z95810 Presence of automatic (implantable) cardiac defibrillator: Secondary | ICD-10-CM

## 2018-01-17 DIAGNOSIS — I10 Essential (primary) hypertension: Secondary | ICD-10-CM

## 2018-01-17 DIAGNOSIS — I428 Other cardiomyopathies: Secondary | ICD-10-CM | POA: Diagnosis not present

## 2018-01-17 NOTE — Telephone Encounter (Signed)
LMOVM reminding pt to send remote transmission.   

## 2018-01-18 NOTE — Progress Notes (Signed)
Remote ICD transmission.   

## 2018-01-18 NOTE — Progress Notes (Signed)
EPIC Encounter for ICM Monitoring  Patient Name: Carolyn [name] is a 77 y.o. female Date: 01/18/2018 Primary Care Physican: Biagio Borg, MD Primary Cardiologist: Angelena Form Electrophysiologist: Allred Bi-V Pacing: >99%%   Last Weight: 160lbs        Heart Failure questions reviewed, pt asymptomatic.   Thoracic impedance normal.   Prescribed: Furosemide 20 mg 1tablet daily.   Labs: 11/08/2017 Creatinine 0.92, BUN 15, Potassium 4.0, Sodium 140 10/04/2017 Creatinine 1.05, BUN 26, Potassium 4.6, Sodium 140, EGFR 54.17 04/06/2017 Creatinine 0.89, BUN 13, Potassium 4.2, Sodium 139, EGFR 65.65 09/15/2016 Creatinine 0.84, BUN 9, Potassium 3.8, Sodium 140 03/10/2016 Creatinine 0.76, BUN 12, Potassium 3.6, Sodium 138  Recommendations: No changes.   Encouraged to call for fluid symptoms.  Follow-up plan: ICM clinic phone appointment on 02/17/2018.    Copy of ICM check sent to Dr. Rayann Heman.   3 month ICM trend: 01/17/2018    1 Year ICM trend:       Rosalene Billings, RN 01/18/2018 10:59 AM

## 2018-01-21 ENCOUNTER — Other Ambulatory Visit: Payer: Self-pay | Admitting: Internal Medicine

## 2018-01-24 ENCOUNTER — Other Ambulatory Visit: Payer: Self-pay | Admitting: Internal Medicine

## 2018-01-24 MED ORDER — ALPRAZOLAM 1 MG PO TABS: ORAL_TABLET | ORAL | 2 refills | Status: DC

## 2018-01-24 NOTE — Telephone Encounter (Signed)
   LOV:10/04/17 NextOV: 04/08/18  Last Filled/Quantity:12/22/17 120#

## 2018-01-24 NOTE — Telephone Encounter (Signed)
Done erx 

## 2018-01-26 ENCOUNTER — Telehealth: Payer: Self-pay | Admitting: Internal Medicine

## 2018-01-26 NOTE — Telephone Encounter (Signed)
Copied from [place] [id number]. Topic: Quick Communication - Rx Refill/Question >> Jan 26, 2018  1:32 PM Reyne Dumas L wrote: Medication:  ALPRAZolam Duanne Moron) 1 MG tablet  Pt states that the pharmacy has told her that she can not get her Xanax because she also takes traMADol (ULTRAM) 50 MG tablet.  Told pt to contact her doctor so that the insurance could be notified to release the Xanax to pt.  Preferred Pharmacy (with phone number or street name): [id number] [address], [place]. [phone] (Phone) [phone] (Fax)  Agent: Please be advised that RX refills may take up to 3 business days. We ask that you follow-up with your pharmacy.

## 2018-01-27 ENCOUNTER — Other Ambulatory Visit: Payer: Self-pay | Admitting: Cardiovascular Disease

## 2018-01-27 NOTE — Telephone Encounter (Signed)
Concern over high risk interaction of medication- sent for provider review and advisement.

## 2018-01-27 NOTE — Telephone Encounter (Signed)
Dx codes were needed to be provided to dispense meds.

## 2018-01-27 NOTE — Telephone Encounter (Signed)
Ok to take xanax and tramadol as prescribed

## 2018-01-28 ENCOUNTER — Encounter (INDEPENDENT_AMBULATORY_CARE_PROVIDER_SITE_OTHER): Payer: Medicare PPO | Admitting: Ophthalmology

## 2018-01-28 DIAGNOSIS — H353211 Exudative age-related macular degeneration, right eye, with active choroidal neovascularization: Secondary | ICD-10-CM | POA: Diagnosis not present

## 2018-01-28 DIAGNOSIS — I10 Essential (primary) hypertension: Secondary | ICD-10-CM

## 2018-01-28 DIAGNOSIS — H353122 Nonexudative age-related macular degeneration, left eye, intermediate dry stage: Secondary | ICD-10-CM

## 2018-01-28 DIAGNOSIS — H35033 Hypertensive retinopathy, bilateral: Secondary | ICD-10-CM | POA: Diagnosis not present

## 2018-01-28 DIAGNOSIS — H43813 Vitreous degeneration, bilateral: Secondary | ICD-10-CM

## 2018-02-17 ENCOUNTER — Ambulatory Visit (INDEPENDENT_AMBULATORY_CARE_PROVIDER_SITE_OTHER): Payer: Medicare PPO

## 2018-02-17 DIAGNOSIS — I5032 Chronic diastolic (congestive) heart failure: Secondary | ICD-10-CM

## 2018-02-17 DIAGNOSIS — Z95810 Presence of automatic (implantable) cardiac defibrillator: Secondary | ICD-10-CM | POA: Diagnosis not present

## 2018-02-17 NOTE — Progress Notes (Signed)
EPIC Encounter for ICM Monitoring  Patient Name: Carolyn [name] is a 77 y.o. female Date: 02/17/2018 Primary Care Physican: Biagio Borg, MD Primary Cardiologist: Angelena Form Electrophysiologist: Allred Bi-V Pacing: >99%% Last Weight: 160lbs Today's Weight:  unknown      Heart Failure questions reviewed, pt reports stomach bloating.   Thoracic impedance abnormal suggesting fluid accumulation starting 02/15/2018.   Prescribed: Furosemide 20 mg 1tablet daily.   Labs: 11/08/2017 Creatinine 0.92, BUN 15, Potassium 4.0, Sodium 140 10/04/2017 Creatinine 1.05, BUN 26, Potassium 4.6, Sodium 140, EGFR 54.17 04/06/2017 Creatinine 0.89, BUN 13, Potassium 4.2, Sodium 139, EGFR 65.65 09/15/2016 Creatinine 0.84, BUN 9, Potassium 3.8, Sodium 140 03/10/2016 Creatinine 0.76, BUN 12, Potassium 3.6, Sodium 138  Recommendations: Advised to take Furosemide 20 mg take 2 tablets (40 mg total) x 3 days.  Follow-up plan: ICM clinic phone appointment on 02/28/2018 to recheck fluid levels.     Copy of ICM check sent to Dr. Rayann Heman and Dr Angelena Form for review.   3 month ICM trend: 02/17/2018    1 Year ICM trend:       Rosalene Billings, RN 02/17/2018 12:09 PM

## 2018-02-23 ENCOUNTER — Other Ambulatory Visit: Payer: Self-pay | Admitting: Internal Medicine

## 2018-02-23 NOTE — Telephone Encounter (Signed)
Done erx 

## 2018-02-25 ENCOUNTER — Encounter (INDEPENDENT_AMBULATORY_CARE_PROVIDER_SITE_OTHER): Payer: Medicare PPO | Admitting: Ophthalmology

## 2018-02-28 ENCOUNTER — Ambulatory Visit (INDEPENDENT_AMBULATORY_CARE_PROVIDER_SITE_OTHER): Payer: Medicare PPO

## 2018-02-28 DIAGNOSIS — I5032 Chronic diastolic (congestive) heart failure: Secondary | ICD-10-CM

## 2018-02-28 DIAGNOSIS — Z95810 Presence of automatic (implantable) cardiac defibrillator: Secondary | ICD-10-CM

## 2018-03-01 ENCOUNTER — Telehealth: Payer: Self-pay

## 2018-03-01 NOTE — Telephone Encounter (Signed)
Remote ICM transmission received.  Attempted call to patient regarding ICM remote transmission and left detailed message, per DPR, with next ICM remote transmission date of 03/31/2018.  Advised to return call for any fluid symptoms or questions.

## 2018-03-01 NOTE — Progress Notes (Signed)
EPIC Encounter for ICM Monitoring  Patient Name: AVANTI JETTER is a 77 y.o. female Date: 03/01/2018 Primary Care Physican: Biagio Borg, MD Primary Cardiologist: Angelena Form Electrophysiologist: Allred Bi-V Pacing: >99%% Last Weight:160lbs Today's Weight:  unknown                                                  Attempted call to patient and unable to reach.  Left detailed message, per DPR, regarding transmission.  Transmission reviewed.    Thoracic impedance returned to normal after recommendation to take extra Furosemide x 3 days  Prescribed: Furosemide 20 mg 1tablet daily.   Labs: 11/08/2017 Creatinine 0.92, BUN 15, Potassium 4.0, Sodium 140 10/04/2017 Creatinine 1.05, BUN 26, Potassium 4.6, Sodium 140, EGFR 54.17 04/06/2017 Creatinine 0.89, BUN 13, Potassium 4.2, Sodium 139, EGFR 65.65 09/15/2016 Creatinine 0.84, BUN 9, Potassium 3.8, Sodium 140 03/10/2016 Creatinine 0.76, BUN 12, Potassium 3.6, Sodium 138  Recommendations:  Left voice mail with ICM number and encouraged to call if experiencing any fluid symptoms.  Follow-up plan: ICM clinic phone appointment on 03/31/2018  Copy of ICM check sent to Dr. Rayann Heman.  3 month ICM trend: 02/28/2018     1 Year ICM trend:       Rosalene Billings, RN 03/01/2018 10:32 AM

## 2018-03-15 ENCOUNTER — Other Ambulatory Visit: Payer: Self-pay | Admitting: Internal Medicine

## 2018-03-15 MED ORDER — INSULIN GLARGINE 100 UNIT/ML SOLOSTAR PEN: 37.0000 [IU] | PEN_INJECTOR | Freq: Every day | SUBCUTANEOUS | 3 refills | Status: DC

## 2018-03-15 NOTE — Telephone Encounter (Signed)
Copied from [place] [id number]. Topic: Quick Communication - Rx Refill/Question >> Mar 15, 2018  2:15 PM Scherrie Gerlach wrote: Medication: LANTUS SOLOSTAR 100 UNIT/ML Solostar Pen  Has the patient contacted their pharmacy? yes Pt states she has been calling the pharmacy 2 weeks for this refill and they told pt we are not responding. Advised pt I would submit request, as she is about to run out. Spoke with the pharmacy and she has filled this entire Rx and has no more refills on file.  [id number] [address], [place]. [phone] (Phone) [phone] (Fax)

## 2018-03-20 LAB — CUP PACEART REMOTE DEVICE CHECK
Battery Remaining Longevity: 50 mo
Brady Statistic AP VS Percent: 1 %
Brady Statistic AS VP Percent: 99 %
Brady Statistic AS VS Percent: 1 %
HIGH POWER IMPEDANCE MEASURED VALUE: 72 Ohm
HighPow Impedance: 72 Ohm
Implantable Lead Implant Date: 20100930
Implantable Lead Implant Date: 20100930
Implantable Lead Location: 753859
Implantable Pulse Generator Implant Date: 20161215
Lead Channel Impedance Value: 340 Ohm
Lead Channel Impedance Value: 480 Ohm
Lead Channel Pacing Threshold Amplitude: 0.75 V
Lead Channel Pacing Threshold Pulse Width: 0.5 ms
Lead Channel Sensing Intrinsic Amplitude: 11.6 mV
Lead Channel Setting Pacing Amplitude: 2 V
Lead Channel Setting Pacing Amplitude: 2.5 V
Lead Channel Setting Pacing Pulse Width: 0.5 ms
Lead Channel Setting Pacing Pulse Width: 0.5 ms
MDC IDC LEAD IMPLANT DT: 20100930
MDC IDC LEAD LOCATION: 753858
MDC IDC LEAD LOCATION: 753860
MDC IDC MSMT BATTERY REMAINING PERCENTAGE: 60 %
MDC IDC MSMT BATTERY VOLTAGE: 2.99 V
MDC IDC MSMT LEADCHNL LV PACING THRESHOLD AMPLITUDE: 1 V
MDC IDC MSMT LEADCHNL LV PACING THRESHOLD PULSEWIDTH: 0.5 ms
MDC IDC MSMT LEADCHNL RA SENSING INTR AMPL: 1.5 mV
MDC IDC MSMT LEADCHNL RV IMPEDANCE VALUE: 560 Ohm
MDC IDC MSMT LEADCHNL RV PACING THRESHOLD AMPLITUDE: 0.75 V
MDC IDC MSMT LEADCHNL RV PACING THRESHOLD PULSEWIDTH: 0.5 ms
MDC IDC SESS DTM: 20191111230126
MDC IDC SET LEADCHNL RA PACING AMPLITUDE: 2 V
MDC IDC SET LEADCHNL RV SENSING SENSITIVITY: 0.5 mV
MDC IDC STAT BRADY AP VP PERCENT: 1 %
MDC IDC STAT BRADY RA PERCENT PACED: 1 %
Pulse Gen Serial Number: 7247188

## 2018-03-22 ENCOUNTER — Other Ambulatory Visit (INDEPENDENT_AMBULATORY_CARE_PROVIDER_SITE_OTHER): Payer: Self-pay | Admitting: Orthopaedic Surgery

## 2018-03-26 ENCOUNTER — Other Ambulatory Visit: Payer: Self-pay | Admitting: Internal Medicine

## 2018-03-26 DIAGNOSIS — I10 Essential (primary) hypertension: Secondary | ICD-10-CM

## 2018-03-31 ENCOUNTER — Ambulatory Visit (INDEPENDENT_AMBULATORY_CARE_PROVIDER_SITE_OTHER): Payer: Medicare PPO

## 2018-03-31 DIAGNOSIS — Z95810 Presence of automatic (implantable) cardiac defibrillator: Secondary | ICD-10-CM

## 2018-03-31 DIAGNOSIS — I5032 Chronic diastolic (congestive) heart failure: Secondary | ICD-10-CM | POA: Diagnosis not present

## 2018-04-01 NOTE — Progress Notes (Signed)
EPIC Encounter for ICM Monitoring  Patient Name: Carolyn [name] is a 78 y.o. female Date: 04/01/2018 Primary Care Physican: Biagio Borg, MD Primary Cardiologist: Angelena Form Electrophysiologist: Allred Bi-V Pacing: >99%% Last Weight:160lbs Today's Weight: unknown  Heart failure questions and asymptomatic.  She was sick with cold/flu during decreased impedance.  She is feeling better now but still has cough.   Thoracic impedance normalbut was abnormal suggesting fluid accumulation from 03/02/2018 through 03/21/2018.    Prescribed:Furosemide 20 mg 1tablet daily.   Labs: 11/08/2017 Creatinine 0.92, BUN 15, Potassium 4.0, Sodium 140 10/04/2017 Creatinine 1.05, BUN 26, Potassium 4.6, Sodium 140, EGFR 54.17 04/06/2017 Creatinine 0.89, BUN 13, Potassium 4.2, Sodium 139, EGFR 65.65 09/15/2016 Creatinine 0.84, BUN 9, Potassium 3.8, Sodium 140 03/10/2016 Creatinine 0.76, BUN 12, Potassium 3.6, Sodium 138  Recommendations: No changes.  Encouraged to call for any fluid symptoms.   Follow-up plan: ICM clinic phone appointment on2/25/2020.  Copy of ICM check sent to Dr.Allred.  3 month ICM trend: 03/31/2018    1 Year ICM trend:       Rosalene Billings, RN 04/01/2018 4:09 PM

## 2018-04-03 ENCOUNTER — Other Ambulatory Visit: Payer: Self-pay | Admitting: Internal Medicine

## 2018-04-08 ENCOUNTER — Encounter: Payer: Self-pay | Admitting: Internal Medicine

## 2018-04-08 ENCOUNTER — Other Ambulatory Visit (INDEPENDENT_AMBULATORY_CARE_PROVIDER_SITE_OTHER): Payer: Medicare PPO

## 2018-04-08 ENCOUNTER — Ambulatory Visit (INDEPENDENT_AMBULATORY_CARE_PROVIDER_SITE_OTHER): Payer: Medicare PPO | Admitting: Internal Medicine

## 2018-04-08 VITALS — BP 124/76 | HR 76 | Temp 97.6°F | Ht 60.0 in | Wt 163.0 lb

## 2018-04-08 DIAGNOSIS — Z794 Long term (current) use of insulin: Secondary | ICD-10-CM | POA: Diagnosis not present

## 2018-04-08 DIAGNOSIS — Z0000 Encounter for general adult medical examination without abnormal findings: Secondary | ICD-10-CM | POA: Diagnosis not present

## 2018-04-08 DIAGNOSIS — E119 Type 2 diabetes mellitus without complications: Secondary | ICD-10-CM | POA: Diagnosis not present

## 2018-04-08 LAB — URINALYSIS, ROUTINE W REFLEX MICROSCOPIC
Bilirubin Urine: NEGATIVE
KETONES UR: NEGATIVE
Nitrite: POSITIVE — AB
PH: 6 (ref 5.0–8.0)
SPECIFIC GRAVITY, URINE: 1.02 (ref 1.000–1.030)
Total Protein, Urine: NEGATIVE
URINE GLUCOSE: NEGATIVE
UROBILINOGEN UA: 0.2 (ref 0.0–1.0)

## 2018-04-08 LAB — LIPID PANEL
CHOL/HDL RATIO: 4
Cholesterol: 156 mg/dL (ref 0–200)
HDL: 40.2 mg/dL (ref >39.00)
LDL CALC: 91 mg/dL (ref 0–99)
NonHDL: 116.05
TRIGLYCERIDES: 125 mg/dL (ref 0.0–149.0)
VLDL: 25 mg/dL (ref 0.0–40.0)

## 2018-04-08 LAB — CBC WITH DIFFERENTIAL/PLATELET
BASOS PCT: 0.4 % (ref 0.0–3.0)
Basophils Absolute: 0 10*3/uL (ref 0.0–0.1)
EOS PCT: 2.5 % (ref 0.0–5.0)
Eosinophils Absolute: 0.2 10*3/uL (ref 0.0–0.7)
HCT: 40.2 % (ref 36.0–46.0)
HEMOGLOBIN: 13.1 g/dL (ref 12.0–15.0)
Lymphocytes Relative: 31.9 % (ref 12.0–46.0)
Lymphs Abs: 3.1 10*3/uL (ref 0.7–4.0)
MCHC: 32.6 g/dL (ref 30.0–36.0)
MCV: 89.3 fl (ref 78.0–100.0)
MONO ABS: 1 10*3/uL (ref 0.1–1.0)
Monocytes Relative: 10.4 % (ref 3.0–12.0)
Neutro Abs: 5.3 10*3/uL (ref 1.4–7.7)
Neutrophils Relative %: 54.8 % (ref 43.0–77.0)
Platelets: 268 10*3/uL (ref 150.0–400.0)
RBC: 4.5 Mil/uL (ref 3.87–5.11)
RDW: 13.8 % (ref 11.5–15.5)
WBC: 9.6 10*3/uL (ref 4.0–10.5)

## 2018-04-08 LAB — MICROALBUMIN / CREATININE URINE RATIO
Creatinine,U: 96.2 mg/dL
MICROALB UR: 1.9 mg/dL (ref 0.0–1.9)
Microalb Creat Ratio: 2 mg/g (ref 0.0–30.0)

## 2018-04-08 LAB — HEPATIC FUNCTION PANEL
ALT: 11 U/L (ref 0–35)
AST: 15 U/L (ref 0–37)
Albumin: 4.3 g/dL (ref 3.5–5.2)
Alkaline Phosphatase: 52 U/L (ref 39–117)
BILIRUBIN TOTAL: 0.4 mg/dL (ref 0.2–1.2)
Bilirubin, Direct: 0.1 mg/dL (ref 0.0–0.3)
TOTAL PROTEIN: 7.5 g/dL (ref 6.0–8.3)

## 2018-04-08 LAB — HEMOGLOBIN A1C: Hgb A1c MFr Bld: 7.2 % — ABNORMAL HIGH (ref 4.6–6.5)

## 2018-04-08 LAB — TSH: TSH: 2.21 u[IU]/mL (ref 0.35–4.50)

## 2018-04-08 LAB — BASIC METABOLIC PANEL WITH GFR
BUN: 22 mg/dL (ref 6–23)
CO2: 30 meq/L (ref 19–32)
Calcium: 9.9 mg/dL (ref 8.4–10.5)
Chloride: 102 meq/L (ref 96–112)
Creatinine, Ser: 0.85 mg/dL (ref 0.40–1.20)
GFR: 64.96 mL/min
Glucose, Bld: 61 mg/dL — ABNORMAL LOW (ref 70–99)
Potassium: 4.3 meq/L (ref 3.5–5.1)
Sodium: 139 meq/L (ref 135–145)

## 2018-04-08 MED ORDER — LISINOPRIL 20 MG PO TABS: 20.0000 mg | ORAL_TABLET | Freq: Every day | ORAL | 3 refills | Status: DC
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
## 2018-04-08 NOTE — Assessment & Plan Note (Signed)
stable overall by history and exam, recent data reviewed with pt, and pt to continue medical treatment as before,  to f/u any worsening symptoms or concerns  

## 2018-04-08 NOTE — Assessment & Plan Note (Signed)

## 2018-04-08 NOTE — Patient Instructions (Signed)
Please continue all other medications as before, and refills have been done if requested -t he lisinopril  Please have the pharmacy call with any other refills you may need.  Please continue your efforts at being more active, low cholesterol diet, and weight control.  You are otherwise up to date with prevention measures today.  Please keep your appointments with your specialists as you may have planned  Please go to the LAB in the Basement (turn left off the elevator) for the tests to be done today  You will be contacted by phone if any changes need to be made immediately.  Otherwise, you will receive a letter about your results with an explanation, but please check with MyChart first.  Please remember to sign up for MyChart if you have not done so, as this will be important to you in the future with finding out test results, communicating by private email, and scheduling acute appointments online when needed.  Please return in 6 months, or sooner if needed, with Lab testing done 3-5 days before

## 2018-04-13 ENCOUNTER — Other Ambulatory Visit: Payer: Self-pay | Admitting: Internal Medicine

## 2018-04-18 ENCOUNTER — Ambulatory Visit (INDEPENDENT_AMBULATORY_CARE_PROVIDER_SITE_OTHER): Payer: Medicare PPO

## 2018-04-18 DIAGNOSIS — I5032 Chronic diastolic (congestive) heart failure: Secondary | ICD-10-CM

## 2018-04-18 DIAGNOSIS — I428 Other cardiomyopathies: Secondary | ICD-10-CM | POA: Diagnosis not present

## 2018-04-19 LAB — CUP PACEART REMOTE DEVICE CHECK
Battery Remaining Longevity: 48 mo
Battery Voltage: 2.98 V
Brady Statistic AP VP Percent: 1 %
Brady Statistic AP VS Percent: 1 %
Brady Statistic AS VP Percent: 99 %
Brady Statistic AS VS Percent: 1 %
Brady Statistic RA Percent Paced: 1 %
Date Time Interrogation Session: 20200210133208
HighPow Impedance: 80 Ohm
HighPow Impedance: 80 Ohm
Implantable Lead Implant Date: 20100930
Implantable Lead Implant Date: 20100930
Implantable Lead Implant Date: 20100930
Implantable Lead Location: 753858
Implantable Lead Location: 753859
Implantable Lead Model: 7122
Implantable Pulse Generator Implant Date: 20161215
Lead Channel Impedance Value: 340 Ohm
Lead Channel Impedance Value: 590 Ohm
Lead Channel Pacing Threshold Amplitude: 0.75 V
Lead Channel Pacing Threshold Amplitude: 0.75 V
Lead Channel Pacing Threshold Amplitude: 1 V
Lead Channel Pacing Threshold Pulse Width: 0.5 ms
Lead Channel Pacing Threshold Pulse Width: 0.5 ms
Lead Channel Pacing Threshold Pulse Width: 0.5 ms
Lead Channel Sensing Intrinsic Amplitude: 1.5 mV
Lead Channel Sensing Intrinsic Amplitude: 12 mV
Lead Channel Setting Pacing Amplitude: 2 V
Lead Channel Setting Pacing Amplitude: 2 V
Lead Channel Setting Pacing Amplitude: 2.5 V
Lead Channel Setting Pacing Pulse Width: 0.5 ms
Lead Channel Setting Pacing Pulse Width: 0.5 ms
Lead Channel Setting Sensing Sensitivity: 0.5 mV
MDC IDC LEAD LOCATION: 753860
MDC IDC MSMT BATTERY REMAINING PERCENTAGE: 56 %
MDC IDC MSMT LEADCHNL LV IMPEDANCE VALUE: 510 Ohm
Pulse Gen Serial Number: 7247188

## 2018-05-02 NOTE — Progress Notes (Signed)
Remote ICD transmission.   

## 2018-05-03 ENCOUNTER — Ambulatory Visit (INDEPENDENT_AMBULATORY_CARE_PROVIDER_SITE_OTHER): Payer: Medicare PPO

## 2018-05-03 DIAGNOSIS — Z95810 Presence of automatic (implantable) cardiac defibrillator: Secondary | ICD-10-CM | POA: Diagnosis not present

## 2018-05-03 DIAGNOSIS — I5032 Chronic diastolic (congestive) heart failure: Secondary | ICD-10-CM | POA: Diagnosis not present

## 2018-05-04 NOTE — Progress Notes (Signed)
EPIC Encounter for ICM Monitoring  Patient Name: Carolyn [name] is a 78 y.o. female Date: 05/04/2018 Primary Care Physican: Biagio Borg, MD Primary Cardiologist: Angelena Form Electrophysiologist: Allred Bi-V Pacing: >99%% Last Weight:160lbs Today's Weight: 160 lbs  Heart failure questions and had some puffiness in ankles but has resolved.    Thoracic impedancenormalbut was abnormal suggesting fluid accumulation from 04/13/2018 - 04/30/2018.    Prescribed:Furosemide 20 mg 1tablet daily.   Labs: 04/08/2018 Creatinine 0.85, BUN 22, Potassium 4.3, Sodium 139, GFR 64.96 11/08/2017 Creatinine 0.92, BUN 15, Potassium 4.0, Sodium 140 10/04/2017 Creatinine 1.05, BUN 26, Potassium 4.6, Sodium 140, GFR 54.17 04/06/2017 Creatinine 0.89, BUN 13, Potassium 4.2, Sodium 139, GFR 65.65 09/15/2016 Creatinine 0.84, BUN 9, Potassium 3.8, Sodium 140 03/10/2016 Creatinine 0.76, BUN 12, Potassium 3.6, Sodium 138  Recommendations:No changes. Advised to limit salt intake.  Encouraged to call for any fluid symptoms.   Follow-up plan: ICM clinic phone appointment on3/30/2020.  Copy of ICM check sent to Dr.Allred.  3 month ICM trend: 05/03/2018    1 Year ICM trend:       Rosalene Billings, RN 05/04/2018 11:08 AM

## 2018-05-06 ENCOUNTER — Ambulatory Visit: Payer: Medicare PPO | Admitting: Internal Medicine

## 2018-05-06 ENCOUNTER — Telehealth: Payer: Self-pay

## 2018-05-06 NOTE — Telephone Encounter (Signed)
Pt can disregard appt confirmation call. She was seen for a CPE last month and can follow up with [place] for her 86mo follow up.   Copied from [place] [id number]. Topic: Appointment Scheduling - Scheduling Inquiry for Clinic >> May 05, 2018  5:01 PM Alanda Slim E wrote: Reason for CRM: Pt had an appt scheduled for 2.28.2020 and called after she received a confirmation call. She wasn't aware of this appt or scheduling it. She just wanted to check with DR. John to see if she was needing to come in or if she should wait until her 6 month follow up that has already been scheduled for August. / please advise

## 2018-05-06 NOTE — Telephone Encounter (Signed)
Left patient vm in reference

## 2018-05-13 ENCOUNTER — Ambulatory Visit: Payer: Medicare PPO | Admitting: Internal Medicine

## 2018-05-20 ENCOUNTER — Other Ambulatory Visit: Payer: Self-pay | Admitting: Internal Medicine

## 2018-05-23 ENCOUNTER — Other Ambulatory Visit: Payer: Self-pay | Admitting: Internal Medicine

## 2018-05-24 NOTE — Telephone Encounter (Signed)
Done erx 

## 2018-06-03 ENCOUNTER — Encounter (INDEPENDENT_AMBULATORY_CARE_PROVIDER_SITE_OTHER): Payer: Medicare PPO | Admitting: Ophthalmology

## 2018-06-06 ENCOUNTER — Other Ambulatory Visit: Payer: Self-pay

## 2018-06-06 ENCOUNTER — Ambulatory Visit (INDEPENDENT_AMBULATORY_CARE_PROVIDER_SITE_OTHER): Payer: Medicare PPO

## 2018-06-06 DIAGNOSIS — Z95810 Presence of automatic (implantable) cardiac defibrillator: Secondary | ICD-10-CM

## 2018-06-06 DIAGNOSIS — I5032 Chronic diastolic (congestive) heart failure: Secondary | ICD-10-CM

## 2018-06-07 ENCOUNTER — Other Ambulatory Visit: Payer: Self-pay | Admitting: Internal Medicine

## 2018-06-07 ENCOUNTER — Telehealth: Payer: Self-pay

## 2018-06-07 NOTE — Telephone Encounter (Signed)
Remote ICM transmission received.  Attempted call to patient regarding ICM remote transmission and left detailed message, per DPR, with next ICM remote transmission date of 07/11/2018.  Advised to return call for any fluid symptoms or questions.

## 2018-06-07 NOTE — Progress Notes (Signed)
EPIC Encounter for ICM Monitoring  Patient Name: Carolyn [name] is a 78 y.o. female Date: 06/07/2018 Primary Care Physican: Biagio Borg, MD Primary Cardiologist: Angelena Form Electrophysiologist: Allred Bi-V Pacing: >99%% Last Weight:160lbs 06/07/2018 Weight: unknown  Attempted call to patient and unable to reach.  Left detailed message per DPR regarding transmission. Transmission reviewed.    Thoracic impedancenormalbut was abnormal suggesting fluid accumulation from3/04/2018 -05/21/2018 and 05/28/2018 - 06/01/2018  Prescribed:Furosemide 20 mg 1tablet daily.   Labs: 04/08/2018 Creatinine 0.85, BUN 22, Potassium 4.3, Sodium 139, GFR 64.96 11/08/2017 Creatinine 0.92, BUN 15, Potassium 4.0, Sodium 140 10/04/2017 Creatinine 1.05, BUN 26, Potassium 4.6, Sodium 140, GFR 54.17 04/06/2017 Creatinine 0.89, BUN 13, Potassium 4.2, Sodium 139, GFR 65.65 09/15/2016 Creatinine 0.84, BUN 9, Potassium 3.8, Sodium 140 03/10/2016 Creatinine 0.76, BUN 12, Potassium 3.6, Sodium 138  Recommendations:Left voice mail with ICM number and encouraged to call if experiencing any fluid symptoms.   Follow-up plan: ICM clinic phone appointment on5/02/2019.  Copy of ICM check sent to Dr.Allred.  3 month ICM trend: 06/07/2018    1 Year ICM trend:       Rosalene Billings, RN 06/07/2018 5:27 PM

## 2018-06-29 ENCOUNTER — Other Ambulatory Visit: Payer: Self-pay | Admitting: Internal Medicine

## 2018-06-29 DIAGNOSIS — I10 Essential (primary) hypertension: Secondary | ICD-10-CM

## 2018-07-01 ENCOUNTER — Other Ambulatory Visit: Payer: Self-pay | Admitting: Internal Medicine

## 2018-07-02 ENCOUNTER — Other Ambulatory Visit: Payer: Self-pay | Admitting: Internal Medicine

## 2018-07-18 ENCOUNTER — Other Ambulatory Visit: Payer: Self-pay

## 2018-07-18 ENCOUNTER — Ambulatory Visit (INDEPENDENT_AMBULATORY_CARE_PROVIDER_SITE_OTHER): Payer: Medicare HMO | Admitting: *Deleted

## 2018-07-18 DIAGNOSIS — I5032 Chronic diastolic (congestive) heart failure: Secondary | ICD-10-CM

## 2018-07-18 DIAGNOSIS — I428 Other cardiomyopathies: Secondary | ICD-10-CM

## 2018-07-19 ENCOUNTER — Other Ambulatory Visit: Payer: Self-pay

## 2018-07-19 ENCOUNTER — Telehealth: Payer: Self-pay

## 2018-07-19 ENCOUNTER — Ambulatory Visit (INDEPENDENT_AMBULATORY_CARE_PROVIDER_SITE_OTHER): Payer: Medicare HMO

## 2018-07-19 DIAGNOSIS — Z95810 Presence of automatic (implantable) cardiac defibrillator: Secondary | ICD-10-CM | POA: Diagnosis not present

## 2018-07-19 DIAGNOSIS — I5032 Chronic diastolic (congestive) heart failure: Secondary | ICD-10-CM

## 2018-07-19 LAB — CUP PACEART REMOTE DEVICE CHECK
Date Time Interrogation Session: 20200512101054
Implantable Lead Implant Date: 20100930
Implantable Lead Implant Date: 20100930
Implantable Lead Implant Date: 20100930
Implantable Lead Location: 753858
Implantable Lead Location: 753859
Implantable Lead Location: 753860
Implantable Lead Model: 7122
Implantable Pulse Generator Implant Date: 20161215
Pulse Gen Serial Number: 7247188

## 2018-07-19 NOTE — Telephone Encounter (Signed)
Remote ICM transmission received.  Attempted call to patient regarding ICM remote transmission and left message to return call per [facility] regarding transmission.

## 2018-07-19 NOTE — Progress Notes (Signed)
EPIC Encounter for ICM Monitoring  Patient Name: Carolyn [name] is a 78 y.o. female Date: 07/19/2018 Primary Care Physican: Biagio Borg, MD Primary Cardiologist: Angelena Form Electrophysiologist: Allred Bi-V Pacing: >99%% Last Weight:160lbs 07/19/2018 Weight:unknown  Attempted call to patient and unable to reach.  Left detailed message per DPR regarding transmission. Transmission reviewed.    CorVue Thoracic impedance abnormal suggesting fluid accumulation 07/13/2018.  Prescribed:Furosemide 20 mg 1tablet daily.   Labs: 04/08/2018 Creatinine 0.85, BUN 22, Potassium 4.3, Sodium 139, GFR 64.96 11/08/2017 Creatinine 0.92, BUN 15, Potassium 4.0, Sodium 140 10/04/2017 Creatinine 1.05, BUN 26, Potassium 4.6, Sodium 140, GFR 54.17 04/06/2017 Creatinine 0.89, BUN 13, Potassium 4.2, Sodium 139, GFR 65.65 09/15/2016 Creatinine 0.84, BUN 9, Potassium 3.8, Sodium 140 03/10/2016 Creatinine 0.76, BUN 12, Potassium 3.6, Sodium 138  Recommendations:Left voice mail with ICM number to return call.    Follow-up plan: ICM clinic phone appointment on5/20/2020 to recheck fluid levels.  Copy of ICM check sent to Dr.Allred.   3 month ICM trend: 07/19/2018    1 Year ICM trend:       Rosalene Billings, RN 07/19/2018 9:23 AM

## 2018-07-26 ENCOUNTER — Other Ambulatory Visit: Payer: Self-pay | Admitting: Internal Medicine

## 2018-07-26 NOTE — Telephone Encounter (Signed)
Done erx 

## 2018-07-27 ENCOUNTER — Other Ambulatory Visit: Payer: Self-pay

## 2018-07-27 ENCOUNTER — Ambulatory Visit (INDEPENDENT_AMBULATORY_CARE_PROVIDER_SITE_OTHER): Payer: Medicare HMO

## 2018-07-27 DIAGNOSIS — Z95810 Presence of automatic (implantable) cardiac defibrillator: Secondary | ICD-10-CM

## 2018-07-27 DIAGNOSIS — I5032 Chronic diastolic (congestive) heart failure: Secondary | ICD-10-CM

## 2018-07-27 NOTE — Progress Notes (Signed)
EPIC Encounter for ICM Monitoring  Patient Name: Carolyn [name] is a 78 y.o. female Date: 07/27/2018 Primary Care Physican: Biagio Borg, MD Primary Cardiologist: Angelena Form Electrophysiologist: Allred Bi-V Pacing: >99%% Last Weight:160lbs   Transmission reviewed.   CorVue Thoracic impedance returned to baseline 07/20/2018.  Prescribed:Furosemide 20 mg 1tablet daily.   Labs: 04/08/2018 Creatinine 0.85, BUN 22, Potassium 4.3, Sodium 139, GFR 64.96 11/08/2017 Creatinine 0.92, BUN 15, Potassium 4.0, Sodium 140 10/04/2017 Creatinine 1.05, BUN 26, Potassium 4.6, Sodium 140, GFR 54.17 04/06/2017 Creatinine 0.89, BUN 13, Potassium 4.2, Sodium 139, GFR 65.65 09/15/2016 Creatinine 0.84, BUN 9, Potassium 3.8, Sodium 140 03/10/2016 Creatinine 0.76, BUN 12, Potassium 3.6, Sodium 138  Recommendations: None   Follow-up plan: ICM clinic phone appointment on6/15/2020.  Copy of ICM check sent to Dr.Allred.   3 month ICM trend: 07/27/2018    1 Year ICM trend:       Rosalene Billings, RN 07/27/2018 1:51 PM

## 2018-07-29 NOTE — Progress Notes (Signed)
Remote ICD transmission.   

## 2018-08-03 ENCOUNTER — Other Ambulatory Visit: Payer: Self-pay | Admitting: Internal Medicine

## 2018-08-22 ENCOUNTER — Ambulatory Visit (INDEPENDENT_AMBULATORY_CARE_PROVIDER_SITE_OTHER): Payer: Medicare HMO

## 2018-08-22 DIAGNOSIS — I5032 Chronic diastolic (congestive) heart failure: Secondary | ICD-10-CM

## 2018-08-22 DIAGNOSIS — Z95810 Presence of automatic (implantable) cardiac defibrillator: Secondary | ICD-10-CM

## 2018-08-23 ENCOUNTER — Telehealth: Payer: Self-pay

## 2018-08-23 NOTE — Progress Notes (Signed)
EPIC Encounter for ICM Monitoring  Patient Name: Carolyn [name] is a 78 y.o. female Date: 08/23/2018 Primary Care Physican: Biagio Borg, MD Primary Cardiologist: Angelena Form Electrophysiologist: Allred Bi-V Pacing: >99%% Last Weight:160lbs   Attempted call to patient and unable to reach.  Left detailed message per DPR regarding transmission. Transmission reviewed.   CorVueThoracic impedancesuggesting possible fluid accumulation starting 08/19/2018.  Prescribed:Furosemide 20 mg 1tablet daily.   Labs: 04/08/2018 Creatinine 0.85, BUN 22, Potassium 4.3, Sodium 139, GFR 64.96 11/08/2017 Creatinine 0.92, BUN 15, Potassium 4.0, Sodium 140 10/04/2017 Creatinine 1.05, BUN 26, Potassium 4.6, Sodium 140, GFR 54.17 04/06/2017 Creatinine 0.89, BUN 13, Potassium 4.2, Sodium 139, GFR 65.65 09/15/2016 Creatinine 0.84, BUN 9, Potassium 3.8, Sodium 140 03/10/2016 Creatinine 0.76, BUN 12, Potassium 3.6, Sodium 138  Recommendations: Unable to reach.  If patient returns call will advise to follow ICM protocol to take extra Furosemide 1 tablet daily x 3 days.  Follow-up plan: ICM clinic phone appointment on6/24/2020 to recheck fluid levels.  Copy of ICM check sent to Dr.Allred.  3 month ICM trend: 08/22/2018    1 Year ICM trend:       Rosalene Billings, RN 08/23/2018 1:37 PM

## 2018-08-23 NOTE — Telephone Encounter (Signed)
Remote ICM transmission received.  Attempted call to patient regarding ICM remote transmission and left detailed message, per DPR, to return call.    

## 2018-08-25 ENCOUNTER — Other Ambulatory Visit: Payer: Self-pay | Admitting: Internal Medicine

## 2018-08-25 NOTE — Telephone Encounter (Signed)
Done erx 

## 2018-08-26 ENCOUNTER — Other Ambulatory Visit: Payer: Self-pay | Admitting: Internal Medicine

## 2018-08-26 ENCOUNTER — Telehealth: Payer: Self-pay

## 2018-08-26 NOTE — Telephone Encounter (Signed)
Please clarify "previous med" as I am not sure of this

## 2018-08-26 NOTE — Telephone Encounter (Signed)
Copied from [place] [id number]. Topic: General - Other >> Aug 26, 2018  3:05 PM Keene Breath wrote: Reason for CRM: Patient called to inform the doctor that the medication diclofenac sodium (VOLTAREN) 1 % GEL has caused her to have a  bad rash on her that is painful.  Patient would like to use the old medication.  Please advise and call patient to let her know what she should do.  CB# [phone] >> Aug 26, 2018  3:15 PM Morey Hummingbird wrote: Not prescribed by Jenny Reichmann, can advice be given?

## 2018-08-29 NOTE — Telephone Encounter (Signed)
Called pt, LVM for her to follow up with prescribing physician.

## 2018-08-31 ENCOUNTER — Ambulatory Visit (INDEPENDENT_AMBULATORY_CARE_PROVIDER_SITE_OTHER): Payer: Medicare HMO

## 2018-08-31 ENCOUNTER — Telehealth: Payer: Self-pay

## 2018-08-31 DIAGNOSIS — Z95810 Presence of automatic (implantable) cardiac defibrillator: Secondary | ICD-10-CM

## 2018-08-31 DIAGNOSIS — I5032 Chronic diastolic (congestive) heart failure: Secondary | ICD-10-CM

## 2018-08-31 NOTE — Telephone Encounter (Signed)
Transmission received 08-31-2018

## 2018-08-31 NOTE — Telephone Encounter (Signed)
Left message for patient to remind of missed remote transmission.  

## 2018-09-02 NOTE — Progress Notes (Signed)
EPIC Encounter for ICM Monitoring  Patient Name: Carolyn [name] is a 78 y.o. female Date: 09/02/2018 Primary Care Physican: Biagio Borg, MD Primary Cardiologist: Angelena Form Electrophysiologist: Allred Bi-V Pacing: >99%% Last Weight:160lbs   Transmission reviewed.   CorVueThoracic impedance returned to normal since 08/22/2018 remote transmission.  Prescribed:Furosemide 20 mg 1tablet daily.   Labs: 04/08/2018 Creatinine 0.85, BUN 22, Potassium 4.3, Sodium 139, GFR 64.96 11/08/2017 Creatinine 0.92, BUN 15, Potassium 4.0, Sodium 140 10/04/2017 Creatinine 1.05, BUN 26, Potassium 4.6, Sodium 140, GFR 54.17 04/06/2017 Creatinine 0.89, BUN 13, Potassium 4.2, Sodium 139, GFR 65.65 09/15/2016 Creatinine 0.84, BUN 9, Potassium 3.8, Sodium 140 03/10/2016 Creatinine 0.76, BUN 12, Potassium 3.6, Sodium 138  Recommendations:None  Follow-up plan: ICM clinic phone appointment on7/20/2020.  Copy of ICM check sent to Dr.Allred.   3 month ICM trend: 08/31/2018    1 Year ICM trend:       Rosalene Billings, RN 09/02/2018 5:16 PM

## 2018-09-22 ENCOUNTER — Telehealth: Payer: Self-pay | Admitting: Internal Medicine

## 2018-09-22 NOTE — Telephone Encounter (Signed)
Called patient to schedule AWV; no answer, no voicemail setup. SF

## 2018-09-26 ENCOUNTER — Ambulatory Visit (INDEPENDENT_AMBULATORY_CARE_PROVIDER_SITE_OTHER): Payer: Medicare HMO

## 2018-09-26 ENCOUNTER — Other Ambulatory Visit: Payer: Self-pay | Admitting: Internal Medicine

## 2018-09-26 DIAGNOSIS — I5032 Chronic diastolic (congestive) heart failure: Secondary | ICD-10-CM | POA: Diagnosis not present

## 2018-09-26 DIAGNOSIS — Z95810 Presence of automatic (implantable) cardiac defibrillator: Secondary | ICD-10-CM | POA: Diagnosis not present

## 2018-09-30 ENCOUNTER — Telehealth: Payer: Self-pay

## 2018-09-30 NOTE — Progress Notes (Signed)
EPIC Encounter for ICM Monitoring  Patient Name: Carolyn [name] is a 78 y.o. female Date: 09/30/2018 Primary Care Physican: Biagio Borg, MD Primary Cardiologist: Angelena Form Electrophysiologist: Allred Bi-V Pacing: >99%% Last Weight:160lbs   Attempted call to patient and unable to reach.   Transmission reviewed.   CorVueThoracic impedance suggestive of possible ongoing fluid accumulation.  Prescribed:Furosemide 20 mg 1tablet daily.   Labs: 04/08/2018 Creatinine 0.85, BUN 22, Potassium 4.3, Sodium 139, GFR 64.96 11/08/2017 Creatinine 0.92, BUN 15, Potassium 4.0, Sodium 140 10/04/2017 Creatinine 1.05, BUN 26, Potassium 4.6, Sodium 140, GFR 54.17 04/06/2017 Creatinine 0.89, BUN 13, Potassium 4.2, Sodium 139, GFR 65.65 09/15/2016 Creatinine 0.84, BUN 9, Potassium 3.8, Sodium 140 03/10/2016 Creatinine 0.76, BUN 12, Potassium 3.6, Sodium 138  Recommendations:Unable to reach.  Follow-up plan: ICM clinic phone appointment on8/06/2018 to recheck fluid levels.  Copy of ICM check sent to Dr.Allred and Dr Angelena Form.    3 month ICM trend: 09/26/2018    1 Year ICM trend:       Rosalene Billings, RN 09/30/2018 11:42 AM

## 2018-09-30 NOTE — Telephone Encounter (Signed)
Remote ICM transmission received.  Attempted call to patient regarding ICM remote transmission and no answer.  

## 2018-10-06 ENCOUNTER — Ambulatory Visit: Payer: Self-pay | Admitting: Internal Medicine

## 2018-10-06 ENCOUNTER — Ambulatory Visit: Payer: Medicare HMO | Admitting: Internal Medicine

## 2018-10-06 NOTE — Telephone Encounter (Signed)
Pt. Reports she fell in the grocery store last Thursday. Hit the back of her head and injured her right foot. Reports hurts to weight bear. Reports legs are swollen. Refuses to go to ED. Reports she can not drive. Reports she will have her children take her to [facility] tomorrow.  Reason for Disposition . [1] SEVERE pain AND [2] not improved 2 hours after pain medicine/ice packs  Answer Assessment - Initial Assessment Questions 1. MECHANISM: "How did the injury happen?" (e.g., twisting injury, direct blow)      Fell last Thursday in the grocery and hit head and injured right foot 2. ONSET: "When did the injury happen?" (Minutes or hours ago)      Last Thursday 3. LOCATION: "Where is the injury located?"      Right foot 4. APPEARANCE of INJURY: "What does the injury look like?"      Looks normal 5. WEIGHT-BEARING: "Can you put weight on that foot?" "Can you walk (four steps or more)?"       Can walk on it but it hurts 6. SIZE: For cuts, bruises, or swelling, ask: "How large is it?" (e.g., inches or centimeters;  entire joint)      No cuts 7. PAIN: "Is there pain?" If so, ask: "How bad is the pain?"    (e.g., Scale 1-10; or mild, moderate, severe)     Standing on it 8-9 8. TETANUS: For any breaks in the skin, ask: "When was the last tetanus booster?"     Unsure 9. OTHER SYMPTOMS: "Do you have any other symptoms?"      Legs are swollen 10. PREGNANCY: "Is there any chance you are pregnant?" "When was your last menstrual period?"       No  Protocols used: FOOT AND ANKLE INJURY-A-AH

## 2018-10-06 NOTE — Telephone Encounter (Signed)
Noted  

## 2018-10-10 ENCOUNTER — Ambulatory Visit: Payer: Medicare PPO | Admitting: Internal Medicine

## 2018-10-17 ENCOUNTER — Ambulatory Visit (INDEPENDENT_AMBULATORY_CARE_PROVIDER_SITE_OTHER): Payer: Medicare HMO | Admitting: *Deleted

## 2018-10-17 DIAGNOSIS — I428 Other cardiomyopathies: Secondary | ICD-10-CM

## 2018-10-18 ENCOUNTER — Ambulatory Visit (INDEPENDENT_AMBULATORY_CARE_PROVIDER_SITE_OTHER): Payer: Medicare HMO

## 2018-10-18 DIAGNOSIS — Z95810 Presence of automatic (implantable) cardiac defibrillator: Secondary | ICD-10-CM

## 2018-10-18 DIAGNOSIS — I5032 Chronic diastolic (congestive) heart failure: Secondary | ICD-10-CM

## 2018-10-18 LAB — CUP PACEART REMOTE DEVICE CHECK
Battery Remaining Longevity: 42 mo
Battery Remaining Percentage: 50 %
Battery Voltage: 2.98 V
Brady Statistic AP VP Percent: 1.6 %
Brady Statistic AP VS Percent: 1 %
Brady Statistic AS VP Percent: 98 %
Brady Statistic AS VS Percent: 1 %
Brady Statistic RA Percent Paced: 1.5 %
Date Time Interrogation Session: 20200810143723
HighPow Impedance: 80 Ohm
HighPow Impedance: 80 Ohm
Implantable Lead Implant Date: 20100930
Implantable Lead Implant Date: 20100930
Implantable Lead Implant Date: 20100930
Implantable Lead Location: 753858
Implantable Lead Location: 753859
Implantable Lead Location: 753860
Implantable Lead Model: 7122
Implantable Pulse Generator Implant Date: 20161215
Lead Channel Impedance Value: 340 Ohm
Lead Channel Impedance Value: 540 Ohm
Lead Channel Impedance Value: 640 Ohm
Lead Channel Pacing Threshold Amplitude: 0.75 V
Lead Channel Pacing Threshold Amplitude: 0.75 V
Lead Channel Pacing Threshold Amplitude: 1 V
Lead Channel Pacing Threshold Pulse Width: 0.5 ms
Lead Channel Pacing Threshold Pulse Width: 0.5 ms
Lead Channel Pacing Threshold Pulse Width: 0.5 ms
Lead Channel Sensing Intrinsic Amplitude: 1.3 mV
Lead Channel Sensing Intrinsic Amplitude: 12 mV
Lead Channel Setting Pacing Amplitude: 2 V
Lead Channel Setting Pacing Amplitude: 2 V
Lead Channel Setting Pacing Amplitude: 2.5 V
Lead Channel Setting Pacing Pulse Width: 0.5 ms
Lead Channel Setting Pacing Pulse Width: 0.5 ms
Lead Channel Setting Sensing Sensitivity: 0.5 mV
Pulse Gen Serial Number: 7247188

## 2018-10-21 ENCOUNTER — Other Ambulatory Visit: Payer: Self-pay

## 2018-10-21 ENCOUNTER — Inpatient Hospital Stay (HOSPITAL_COMMUNITY)
Admission: EM | Admit: 2018-10-21 | Discharge: 2018-11-01 | DRG: 166 | Disposition: A | Discharge status: Home Health | Payer: Medicare HMO | Attending: Internal Medicine | Admitting: Internal Medicine

## 2018-10-21 ENCOUNTER — Emergency Department (HOSPITAL_COMMUNITY): Payer: Medicare HMO

## 2018-10-21 DIAGNOSIS — Z881 Allergy status to other antibiotic agents status: Secondary | ICD-10-CM

## 2018-10-21 DIAGNOSIS — Z87442 Personal history of urinary calculi: Secondary | ICD-10-CM

## 2018-10-21 DIAGNOSIS — W19XXXA Unspecified fall, initial encounter: Secondary | ICD-10-CM | POA: Diagnosis present

## 2018-10-21 DIAGNOSIS — F329 Major depressive disorder, single episode, unspecified: Secondary | ICD-10-CM | POA: Diagnosis not present

## 2018-10-21 DIAGNOSIS — Z882 Allergy status to sulfonamides status: Secondary | ICD-10-CM

## 2018-10-21 DIAGNOSIS — E861 Hypovolemia: Secondary | ICD-10-CM | POA: Diagnosis present

## 2018-10-21 DIAGNOSIS — Z885 Allergy status to narcotic agent status: Secondary | ICD-10-CM

## 2018-10-21 DIAGNOSIS — F4322 Adjustment disorder with anxiety: Secondary | ICD-10-CM

## 2018-10-21 DIAGNOSIS — E871 Hypo-osmolality and hyponatremia: Secondary | ICD-10-CM | POA: Diagnosis present

## 2018-10-21 DIAGNOSIS — E11649 Type 2 diabetes mellitus with hypoglycemia without coma: Secondary | ICD-10-CM | POA: Diagnosis present

## 2018-10-21 DIAGNOSIS — E041 Nontoxic single thyroid nodule: Secondary | ICD-10-CM | POA: Diagnosis present

## 2018-10-21 DIAGNOSIS — N179 Acute kidney failure, unspecified: Secondary | ICD-10-CM

## 2018-10-21 DIAGNOSIS — J168 Pneumonia due to other specified infectious organisms: Secondary | ICD-10-CM | POA: Diagnosis not present

## 2018-10-21 DIAGNOSIS — I160 Hypertensive urgency: Secondary | ICD-10-CM | POA: Diagnosis present

## 2018-10-21 DIAGNOSIS — F411 Generalized anxiety disorder: Secondary | ICD-10-CM | POA: Diagnosis present

## 2018-10-21 DIAGNOSIS — I5032 Chronic diastolic (congestive) heart failure: Secondary | ICD-10-CM | POA: Diagnosis present

## 2018-10-21 DIAGNOSIS — Z20828 Contact with and (suspected) exposure to other viral communicable diseases: Secondary | ICD-10-CM | POA: Diagnosis present

## 2018-10-21 DIAGNOSIS — R9431 Abnormal electrocardiogram [ECG] [EKG]: Secondary | ICD-10-CM | POA: Diagnosis present

## 2018-10-21 DIAGNOSIS — Z79890 Hormone replacement therapy (postmenopausal): Secondary | ICD-10-CM

## 2018-10-21 DIAGNOSIS — F17210 Nicotine dependence, cigarettes, uncomplicated: Secondary | ICD-10-CM | POA: Diagnosis present

## 2018-10-21 DIAGNOSIS — T383X5A Adverse effect of insulin and oral hypoglycemic [antidiabetic] drugs, initial encounter: Secondary | ICD-10-CM | POA: Diagnosis present

## 2018-10-21 DIAGNOSIS — S0990XA Unspecified injury of head, initial encounter: Secondary | ICD-10-CM | POA: Diagnosis not present

## 2018-10-21 DIAGNOSIS — M4316 Spondylolisthesis, lumbar region: Secondary | ICD-10-CM | POA: Diagnosis not present

## 2018-10-21 DIAGNOSIS — J189 Pneumonia, unspecified organism: Secondary | ICD-10-CM | POA: Diagnosis present

## 2018-10-21 DIAGNOSIS — E785 Hyperlipidemia, unspecified: Secondary | ICD-10-CM | POA: Diagnosis present

## 2018-10-21 DIAGNOSIS — C3481 Malignant neoplasm of overlapping sites of right bronchus and lung: Secondary | ICD-10-CM

## 2018-10-21 DIAGNOSIS — E119 Type 2 diabetes mellitus without complications: Secondary | ICD-10-CM

## 2018-10-21 DIAGNOSIS — S79911A Unspecified injury of right hip, initial encounter: Secondary | ICD-10-CM | POA: Diagnosis not present

## 2018-10-21 DIAGNOSIS — G9340 Encephalopathy, unspecified: Secondary | ICD-10-CM | POA: Diagnosis not present

## 2018-10-21 DIAGNOSIS — G8929 Other chronic pain: Secondary | ICD-10-CM | POA: Diagnosis present

## 2018-10-21 DIAGNOSIS — Y92009 Unspecified place in unspecified non-institutional (private) residence as the place of occurrence of the external cause: Secondary | ICD-10-CM

## 2018-10-21 DIAGNOSIS — K219 Gastro-esophageal reflux disease without esophagitis: Secondary | ICD-10-CM | POA: Diagnosis present

## 2018-10-21 DIAGNOSIS — I428 Other cardiomyopathies: Secondary | ICD-10-CM | POA: Diagnosis present

## 2018-10-21 DIAGNOSIS — C342 Malignant neoplasm of middle lobe, bronchus or lung: Secondary | ICD-10-CM | POA: Diagnosis present

## 2018-10-21 DIAGNOSIS — E039 Hypothyroidism, unspecified: Secondary | ICD-10-CM | POA: Diagnosis present

## 2018-10-21 DIAGNOSIS — Z888 Allergy status to other drugs, medicaments and biological substances status: Secondary | ICD-10-CM

## 2018-10-21 DIAGNOSIS — M47816 Spondylosis without myelopathy or radiculopathy, lumbar region: Secondary | ICD-10-CM | POA: Diagnosis not present

## 2018-10-21 DIAGNOSIS — F064 Anxiety disorder due to known physiological condition: Secondary | ICD-10-CM | POA: Diagnosis present

## 2018-10-21 DIAGNOSIS — M6282 Rhabdomyolysis: Secondary | ICD-10-CM | POA: Diagnosis present

## 2018-10-21 DIAGNOSIS — R918 Other nonspecific abnormal finding of lung field: Secondary | ICD-10-CM | POA: Diagnosis not present

## 2018-10-21 DIAGNOSIS — M4606 Spinal enthesopathy, lumbar region: Secondary | ICD-10-CM | POA: Diagnosis not present

## 2018-10-21 DIAGNOSIS — Z7982 Long term (current) use of aspirin: Secondary | ICD-10-CM

## 2018-10-21 DIAGNOSIS — R404 Transient alteration of awareness: Secondary | ICD-10-CM | POA: Diagnosis not present

## 2018-10-21 DIAGNOSIS — E669 Obesity, unspecified: Secondary | ICD-10-CM | POA: Diagnosis present

## 2018-10-21 DIAGNOSIS — Z79891 Long term (current) use of opiate analgesic: Secondary | ICD-10-CM

## 2018-10-21 DIAGNOSIS — R0602 Shortness of breath: Secondary | ICD-10-CM | POA: Diagnosis not present

## 2018-10-21 DIAGNOSIS — I110 Hypertensive heart disease with heart failure: Secondary | ICD-10-CM | POA: Diagnosis present

## 2018-10-21 DIAGNOSIS — M62838 Other muscle spasm: Secondary | ICD-10-CM

## 2018-10-21 DIAGNOSIS — I2699 Other pulmonary embolism without acute cor pulmonale: Secondary | ICD-10-CM | POA: Diagnosis not present

## 2018-10-21 DIAGNOSIS — R4182 Altered mental status, unspecified: Secondary | ICD-10-CM | POA: Diagnosis present

## 2018-10-21 DIAGNOSIS — R410 Disorientation, unspecified: Secondary | ICD-10-CM

## 2018-10-21 DIAGNOSIS — R52 Pain, unspecified: Secondary | ICD-10-CM | POA: Diagnosis not present

## 2018-10-21 DIAGNOSIS — Z905 Acquired absence of kidney: Secondary | ICD-10-CM

## 2018-10-21 DIAGNOSIS — Z794 Long term (current) use of insulin: Secondary | ICD-10-CM | POA: Diagnosis not present

## 2018-10-21 DIAGNOSIS — D3502 Benign neoplasm of left adrenal gland: Secondary | ICD-10-CM | POA: Diagnosis not present

## 2018-10-21 DIAGNOSIS — R441 Visual hallucinations: Secondary | ICD-10-CM | POA: Diagnosis present

## 2018-10-21 DIAGNOSIS — M25551 Pain in right hip: Secondary | ICD-10-CM | POA: Diagnosis not present

## 2018-10-21 DIAGNOSIS — Z6831 Body mass index (BMI) 31.0-31.9, adult: Secondary | ICD-10-CM | POA: Diagnosis not present

## 2018-10-21 DIAGNOSIS — M545 Low back pain: Secondary | ICD-10-CM | POA: Diagnosis not present

## 2018-10-21 DIAGNOSIS — E162 Hypoglycemia, unspecified: Secondary | ICD-10-CM | POA: Diagnosis not present

## 2018-10-21 DIAGNOSIS — Z9181 History of falling: Secondary | ICD-10-CM

## 2018-10-21 DIAGNOSIS — G9341 Metabolic encephalopathy: Secondary | ICD-10-CM | POA: Diagnosis present

## 2018-10-21 DIAGNOSIS — Z95810 Presence of automatic (implantable) cardiac defibrillator: Secondary | ICD-10-CM

## 2018-10-21 DIAGNOSIS — Z8719 Personal history of other diseases of the digestive system: Secondary | ICD-10-CM

## 2018-10-21 DIAGNOSIS — Z833 Family history of diabetes mellitus: Secondary | ICD-10-CM

## 2018-10-21 DIAGNOSIS — M5416 Radiculopathy, lumbar region: Secondary | ICD-10-CM | POA: Diagnosis present

## 2018-10-21 DIAGNOSIS — R5381 Other malaise: Secondary | ICD-10-CM

## 2018-10-21 DIAGNOSIS — K589 Irritable bowel syndrome without diarrhea: Secondary | ICD-10-CM | POA: Diagnosis present

## 2018-10-21 DIAGNOSIS — N3289 Other specified disorders of bladder: Secondary | ICD-10-CM | POA: Diagnosis present

## 2018-10-21 DIAGNOSIS — I10 Essential (primary) hypertension: Secondary | ICD-10-CM | POA: Diagnosis present

## 2018-10-21 DIAGNOSIS — Z87440 Personal history of urinary (tract) infections: Secondary | ICD-10-CM

## 2018-10-21 DIAGNOSIS — D649 Anemia, unspecified: Secondary | ICD-10-CM | POA: Diagnosis present

## 2018-10-21 DIAGNOSIS — M4604 Spinal enthesopathy, thoracic region: Secondary | ICD-10-CM | POA: Diagnosis not present

## 2018-10-21 DIAGNOSIS — Z91048 Other nonmedicinal substance allergy status: Secondary | ICD-10-CM

## 2018-10-21 DIAGNOSIS — Z90710 Acquired absence of both cervix and uterus: Secondary | ICD-10-CM

## 2018-10-21 DIAGNOSIS — E161 Other hypoglycemia: Secondary | ICD-10-CM | POA: Diagnosis not present

## 2018-10-21 DIAGNOSIS — S199XXA Unspecified injury of neck, initial encounter: Secondary | ICD-10-CM | POA: Diagnosis not present

## 2018-10-21 DIAGNOSIS — C3491 Malignant neoplasm of unspecified part of right bronchus or lung: Secondary | ICD-10-CM | POA: Diagnosis not present

## 2018-10-21 DIAGNOSIS — F32A Depression, unspecified: Secondary | ICD-10-CM | POA: Diagnosis present

## 2018-10-21 DIAGNOSIS — I4510 Unspecified right bundle-branch block: Secondary | ICD-10-CM | POA: Diagnosis not present

## 2018-10-21 DIAGNOSIS — Z79899 Other long term (current) drug therapy: Secondary | ICD-10-CM

## 2018-10-21 DIAGNOSIS — Z9889 Other specified postprocedural states: Secondary | ICD-10-CM

## 2018-10-21 DIAGNOSIS — E160 Drug-induced hypoglycemia without coma: Secondary | ICD-10-CM | POA: Diagnosis not present

## 2018-10-21 DIAGNOSIS — S299XXA Unspecified injury of thorax, initial encounter: Secondary | ICD-10-CM | POA: Diagnosis not present

## 2018-10-21 HISTORY — DX: Malignant neoplasm of unspecified part of right bronchus or lung: C34.91

## 2018-10-21 LAB — COMPREHENSIVE METABOLIC PANEL
ALT: 21 U/L (ref 0–44)
AST: 36 U/L (ref 15–41)
Albumin: 3.4 g/dL — ABNORMAL LOW (ref 3.5–5.0)
Alkaline Phosphatase: 43 U/L (ref 38–126)
Anion gap: 11 (ref 5–15)
BUN: 14 mg/dL (ref 8–23)
CO2: 23 mmol/L (ref 22–32)
Calcium: 9.4 mg/dL (ref 8.9–10.3)
Chloride: 103 mmol/L (ref 98–111)
Creatinine, Ser: 0.81 mg/dL (ref 0.44–1.00)
GFR calc Af Amer: >60 mL/min (ref >60)
GFR calc non Af Amer: >60 mL/min (ref >60)
Glucose, Bld: 88 mg/dL (ref 70–99)
Potassium: 3.7 mmol/L (ref 3.5–5.1)
Sodium: 137 mmol/L (ref 135–145)
Total Bilirubin: 0.5 mg/dL (ref 0.3–1.2)
Total Protein: 6.4 g/dL — ABNORMAL LOW (ref 6.5–8.1)

## 2018-10-21 LAB — CBC WITH DIFFERENTIAL/PLATELET
Abs Immature Granulocytes: 0.05 10*3/uL (ref 0.00–0.07)
Basophils Absolute: 0 10*3/uL (ref 0.0–0.1)
Basophils Relative: 0 %
Eosinophils Absolute: 0 10*3/uL (ref 0.0–0.5)
Eosinophils Relative: 0 %
HCT: 35.5 % — ABNORMAL LOW (ref 36.0–46.0)
Hemoglobin: 11.6 g/dL — ABNORMAL LOW (ref 12.0–15.0)
Immature Granulocytes: 0 %
Lymphocytes Relative: 13 %
Lymphs Abs: 1.6 10*3/uL (ref 0.7–4.0)
MCH: 29.4 pg (ref 26.0–34.0)
MCHC: 32.7 g/dL (ref 30.0–36.0)
MCV: 89.9 fL (ref 80.0–100.0)
Monocytes Absolute: 0.7 10*3/uL (ref 0.1–1.0)
Monocytes Relative: 6 %
Neutro Abs: 9.9 10*3/uL — ABNORMAL HIGH (ref 1.7–7.7)
Neutrophils Relative %: 81 %
Platelets: 233 10*3/uL (ref 150–400)
RBC: 3.95 MIL/uL (ref 3.87–5.11)
RDW: 14.3 % (ref 11.5–15.5)
WBC: 12.3 10*3/uL — ABNORMAL HIGH (ref 4.0–10.5)
nRBC: 0 % (ref 0.0–0.2)

## 2018-10-21 LAB — TROPONIN I (HIGH SENSITIVITY): Troponin I (High Sensitivity): 41 ng/L — ABNORMAL HIGH (ref <18)

## 2018-10-21 LAB — CK: Total CK: 517 U/L — ABNORMAL HIGH (ref 38–234)

## 2018-10-21 LAB — CBG MONITORING, ED
Glucose-Capillary: 60 mg/dL — ABNORMAL LOW (ref 70–99)
Glucose-Capillary: 69 mg/dL — ABNORMAL LOW (ref 70–99)

## 2018-10-21 LAB — PROTIME-INR
INR: 1.1 (ref 0.8–1.2)
Prothrombin Time: 13.9 seconds (ref 11.4–15.2)

## 2018-10-21 LAB — TSH: TSH: 1.407 u[IU]/mL (ref 0.350–4.500)

## 2018-10-21 LAB — LACTIC ACID, PLASMA: Lactic Acid, Venous: 1.1 mmol/L (ref 0.5–1.9)

## 2018-10-21 MED ORDER — LACTATED RINGERS IV BOLUS
1000.0000 mL | Freq: Once | INTRAVENOUS | Status: AC
  Administered 2018-10-21: 1000 mL via INTRAVENOUS

## 2018-10-21 MED ORDER — ACETAMINOPHEN 325 MG PO TABS
650.0000 mg | ORAL_TABLET | Freq: Once | ORAL | Status: AC
  Administered 2018-10-22: 650 mg via ORAL
  Filled 2018-10-21 (×2): qty 2

## 2018-10-21 MED ORDER — DEXTROSE 50 % IV SOLN
1.0000 | Freq: Once | INTRAVENOUS | Status: AC
  Administered 2018-10-21: 50 mL via INTRAVENOUS
  Filled 2018-10-21: qty 50

## 2018-10-21 MED ORDER — SODIUM CHLORIDE 0.9 % IV SOLN
1.0000 g | Freq: Once | INTRAVENOUS | Status: AC
  Administered 2018-10-21: 1 g via INTRAVENOUS
  Filled 2018-10-21: qty 10

## 2018-10-21 MED ORDER — ONDANSETRON HCL 4 MG/2ML IJ SOLN
4.0000 mg | Freq: Once | INTRAMUSCULAR | Status: AC
  Administered 2018-10-21: 4 mg via INTRAVENOUS
  Filled 2018-10-21: qty 2

## 2018-10-21 MED ORDER — MORPHINE SULFATE (PF) 4 MG/ML IV SOLN
4.0000 mg | Freq: Once | INTRAVENOUS | Status: AC
  Administered 2018-10-21: 4 mg via INTRAVENOUS
  Filled 2018-10-21: qty 1

## 2018-10-21 MED ORDER — IOHEXOL 350 MG/ML SOLN
80.0000 mL | Freq: Once | INTRAVENOUS | Status: AC | PRN
  Administered 2018-10-21: 80 mL via INTRAVENOUS

## 2018-10-21 MED ORDER — SODIUM CHLORIDE 0.9 % IV SOLN
500.0000 mg | Freq: Once | INTRAVENOUS | Status: AC
  Administered 2018-10-22: 500 mg via INTRAVENOUS
  Filled 2018-10-21: qty 500

## 2018-10-21 NOTE — ED Notes (Signed)
Pt transported to CT ?

## 2018-10-21 NOTE — ED Provider Notes (Addendum)
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 glands: Left adrenal nodule measures 16 mm with an attenuation value of 36 HU. --Right kidney/ureter: No hydronephrosis or perinephric stranding. No nephrolithiasis. No obstructing ureteral stones. --Left kidney/ureter: Status post left nephrectomy --Urinary bladder: Unremarkable. Stomach/Bowel: --Stomach/Duodenum: No hiatal hernia or other gastric abnormality. Normal duodenal course and caliber. --Small bowel: No dilatation or inflammation. --Colon: No focal abnormality. --Appendix: Surgically absent. Vascular/Lymphatic: Atherosclerotic calcification is present within the  non-aneurysmal abdominal aorta, without hemodynamically significant stenosis. No abdominal or pelvic lymphadenopathy. Reproductive: No free fluid in the pelvis. Musculoskeletal. No bony spinal canal stenosis or focal osseous abnormality. Other: None.  IMPRESSION: 1. Right hilar mass causing occlusion of the right middle lobar pulmonary artery. This is most consistent with bronchogenic carcinoma. 2. Otherwise patent pulmonary arteries. 3. Tiny nodules of the right lung apex could represent satellite nodules. 4.  Aortic atherosclerosis (ICD10-I70.0). 5. 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 Electronically Signed   By: Ulyses Jarred M.D.   On: 10/22/2018 00:35   Dg Hip Unilat With Pelvis 2-3 Views Right  Result Date: 10/21/2018 CLINICAL DATA:  Right hip pain following a fall today. EXAM: DG HIP (WITH OR WITHOUT PELVIS) 2-3V RIGHT COMPARISON:  Right femur dated 05/27/2017. FINDINGS: Normal appearing hips without fracture or dislocation. Lower lumbar spine degenerative changes. IMPRESSION: No fracture or dislocation. Electronically Signed   By: Claudie Revering M.D.   On: 10/21/2018 20:12    Procedures Procedures (including critical care time)  Medications Ordered in ED Medications  acetaminophen (TYLENOL) tablet 650 mg ( Oral Not Given 10/21/18 1930)  azithromycin (ZITHROMAX) 500 mg in sodium chloride 0.9 % 250 mL IVPB (500 mg Intravenous New Bag/Given 10/22/18 0102)  metaxalone (SKELAXIN) tablet 800 mg (has no administration in time range)  ondansetron (ZOFRAN) injection 4 mg (has no administration in time range)  morphine 4 MG/ML injection 4 mg (4 mg Intravenous Given 10/21/18 1929)  lactated ringers bolus 1,000 mL (0 mLs Intravenous Stopped 10/21/18 2130)  dextrose 50 % solution 50 mL (50 mLs Intravenous Given 10/21/18 1929)  ondansetron (ZOFRAN) injection 4 mg (4 mg Intravenous Given 10/21/18 2015)  morphine 4 MG/ML injection 4 mg (4 mg Intravenous Given 10/21/18 2248)  cefTRIAXone (ROCEPHIN) 1 g in sodium chloride 0.9 % 100 mL IVPB (0 g Intravenous Stopped 10/22/18 0005)  dextrose 50 % solution 50 mL (50 mLs Intravenous Given 10/21/18 2314)  iohexol (OMNIPAQUE) 350 MG/ML injection 80 mL (80 mLs Intravenous Contrast Given  10/21/18 2343)  ALPRAZolam (XANAX) tablet 1 mg (1 mg Oral Given 10/22/18 0059)     Initial Impression / Assessment and Plan / ED Course  I have reviewed the triage vital signs and the nursing notes.  Pertinent labs & imaging results that were available during my care of the patient were reviewed by me and considered in my medical decision making (see chart for details).        Ms. Fredenburg is a 78 year old female with a history of congestive heart failure, insulin-dependent diabetes, recurrent UTIs, hypothyroidism, irritable bowel syndrome, chronic low back pain.  I reviewed her medical record.  On arrival she.  To be alert and well oriented.  Upon discussion with her daughter though and further questioning with the patient, she seems to be mildly confused at times.  Initiated work-up to evaluate for traumatic injuries as well as organic etiology of her confusion.  It is also unclear what caused her fall.  She is hemodynamically stable and given that the injuries were at least several hours ago and she is benign abdominal exam now, plain films seem adequate for evaluation of her back and chest pain.  CT head and neck also ordered for fall and altered mental status.  Recheck of her blood glucose arrival showed hypoglycemia at 60 for which she was given a single dose of D50.  Work-up was ultimately notable for consolidation of the right middle lobe suggestive of possible pneumonia.  Patient was treated empirically with ceftriaxone and azithromycin.  Recheck of her glucose several hours later again showed glucose of 69.  Given additional dose of D50.  She remained hemodynamically stable but did have continued pain which required treatment with Tylenol and IV morphine.  On reexam she still has persistent pain.  I am not sure whether she has actual traumatic injury or whether pain could be from lung infarct from PE, which could explain her chest x-ray findings.  Ordered CT chest, abdomen, and pelvis with  contrast to evaluate for PE and traumatic injuries.  She does not have any electrolyte normalities, significant new anemia, coagulopathy, or abnormal TSH.  She does have mild leukocytosis.  Troponin elevated at 40.  Reevaluated and she has continued pain.  Tender to palpation the right flank/paraspinal.  Most consistent with also spasms.  Ordered Skelaxin as it is less sedating than other muscle relaxers.  Chest CT ultimately showed right lung mass concerning for bronchogenic carcinoma with occlusion of the right middle lobar pulmonary artery.  I reviewed these findings with the patient and her daughter.  For new diagnosis of lung cancer, hyperglycemia, altered mental status, elevated troponin, will admit to hospitalist.  Care handed off to [place] at 1:00 as the patient awaits admission.   Final Clinical Impressions(s) / ED Diagnoses   Final diagnoses:  Hypoglycemia  Confusion  Pneumonia of right middle lobe due to infectious organism [facility])  Mass of right lung  Muscle spasm    ED Discharge Orders    None         Tillie Fantasia, MD 10/22/18 0129    Tegeler, Gwenyth Allegra, MD 10/22/18 [id number]

## 2018-10-21 NOTE — Progress Notes (Signed)
EPIC Encounter for ICM Monitoring  Patient Name: Carolyn [name] is a 78 y.o. female Date: 10/21/2018 Primary Care Physican: Biagio Borg, MD Primary Cardiologist: Angelena Form Electrophysiologist: Allred Bi-V Pacing: >99%% Last Weight:160lbs   Transmission reviewed.   CorVueThoracic impedancereturned to normal.  Prescribed:Furosemide 20 mg 1tablet daily.   Labs: 04/08/2018 Creatinine 0.85, BUN 22, Potassium 4.3, Sodium 139, GFR 64.96 11/08/2017 Creatinine 0.92, BUN 15, Potassium 4.0, Sodium 140  Recommendations:None  Follow-up plan: ICM clinic phone appointment on8/25/2020.  Copy of ICM check sent to Dr.Allred.   3 month ICM trend: 10/17/2018    1 Year ICM trend:       Rosalene Billings, RN 10/21/2018 9:02 AM

## 2018-10-21 NOTE — ED Triage Notes (Signed)
Brought in by [name]. Carolyn [name] states she fell last night around 7pm and was unable to get up. Crawled to the phone today and was able to call son. Son found Carolyn [name] in floor and called EMS. Carolyn [name] vomited once en route. C/O right flank pain and right hip pain. CBG 62. Carolyn [name] received D50, 4mg  Zofran and 131ml NS

## 2018-10-22 ENCOUNTER — Encounter (HOSPITAL_COMMUNITY): Payer: Self-pay | Admitting: Family Medicine

## 2018-10-22 DIAGNOSIS — I160 Hypertensive urgency: Secondary | ICD-10-CM

## 2018-10-22 DIAGNOSIS — F411 Generalized anxiety disorder: Secondary | ICD-10-CM

## 2018-10-22 DIAGNOSIS — E039 Hypothyroidism, unspecified: Secondary | ICD-10-CM

## 2018-10-22 DIAGNOSIS — Z794 Long term (current) use of insulin: Secondary | ICD-10-CM

## 2018-10-22 DIAGNOSIS — G9340 Encephalopathy, unspecified: Secondary | ICD-10-CM

## 2018-10-22 DIAGNOSIS — E160 Drug-induced hypoglycemia without coma: Secondary | ICD-10-CM

## 2018-10-22 DIAGNOSIS — G8929 Other chronic pain: Secondary | ICD-10-CM

## 2018-10-22 DIAGNOSIS — F329 Major depressive disorder, single episode, unspecified: Secondary | ICD-10-CM

## 2018-10-22 DIAGNOSIS — I10 Essential (primary) hypertension: Secondary | ICD-10-CM

## 2018-10-22 DIAGNOSIS — M545 Low back pain: Secondary | ICD-10-CM

## 2018-10-22 DIAGNOSIS — R9431 Abnormal electrocardiogram [ECG] [EKG]: Secondary | ICD-10-CM | POA: Diagnosis present

## 2018-10-22 DIAGNOSIS — I5032 Chronic diastolic (congestive) heart failure: Secondary | ICD-10-CM | POA: Diagnosis present

## 2018-10-22 DIAGNOSIS — E119 Type 2 diabetes mellitus without complications: Secondary | ICD-10-CM

## 2018-10-22 DIAGNOSIS — R918 Other nonspecific abnormal finding of lung field: Secondary | ICD-10-CM | POA: Diagnosis present

## 2018-10-22 LAB — CBG MONITORING, ED
Glucose-Capillary: 112 mg/dL — ABNORMAL HIGH (ref 70–99)
Glucose-Capillary: 113 mg/dL — ABNORMAL HIGH (ref 70–99)
Glucose-Capillary: 162 mg/dL — ABNORMAL HIGH (ref 70–99)
Glucose-Capillary: 55 mg/dL — ABNORMAL LOW (ref 70–99)
Glucose-Capillary: 60 mg/dL — ABNORMAL LOW (ref 70–99)
Glucose-Capillary: 74 mg/dL (ref 70–99)
Glucose-Capillary: 89 mg/dL (ref 70–99)

## 2018-10-22 LAB — CBC WITH DIFFERENTIAL/PLATELET
Abs Immature Granulocytes: 0.06 10*3/uL (ref 0.00–0.07)
Basophils Absolute: 0.1 10*3/uL (ref 0.0–0.1)
Basophils Relative: 0 %
Eosinophils Absolute: 0 10*3/uL (ref 0.0–0.5)
Eosinophils Relative: 0 %
HCT: 36 % (ref 36.0–46.0)
Hemoglobin: 11.8 g/dL — ABNORMAL LOW (ref 12.0–15.0)
Immature Granulocytes: 0 %
Lymphocytes Relative: 27 %
Lymphs Abs: 3.9 10*3/uL (ref 0.7–4.0)
MCH: 28.9 pg (ref 26.0–34.0)
MCHC: 32.8 g/dL (ref 30.0–36.0)
MCV: 88.2 fL (ref 80.0–100.0)
Monocytes Absolute: 1.5 10*3/uL — ABNORMAL HIGH (ref 0.1–1.0)
Monocytes Relative: 10 %
Neutro Abs: 9.1 10*3/uL — ABNORMAL HIGH (ref 1.7–7.7)
Neutrophils Relative %: 63 %
Platelets: 270 10*3/uL (ref 150–400)
RBC: 4.08 MIL/uL (ref 3.87–5.11)
RDW: 14.3 % (ref 11.5–15.5)
WBC: 14.6 10*3/uL — ABNORMAL HIGH (ref 4.0–10.5)
nRBC: 0 % (ref 0.0–0.2)

## 2018-10-22 LAB — VITAMIN B12: Vitamin B-12: 314 pg/mL (ref 180–914)

## 2018-10-22 LAB — SARS CORONAVIRUS 2 BY RT PCR (HOSPITAL ORDER, PERFORMED IN ~~LOC~~ HOSPITAL LAB): SARS Coronavirus 2: NEGATIVE

## 2018-10-22 LAB — BASIC METABOLIC PANEL
Anion gap: 12 (ref 5–15)
BUN: 13 mg/dL (ref 8–23)
CO2: 22 mmol/L (ref 22–32)
Calcium: 9.4 mg/dL (ref 8.9–10.3)
Chloride: 100 mmol/L (ref 98–111)
Creatinine, Ser: 0.94 mg/dL (ref 0.44–1.00)
GFR calc Af Amer: >60 mL/min (ref >60)
GFR calc non Af Amer: 59 mL/min — ABNORMAL LOW (ref >60)
Glucose, Bld: 63 mg/dL — ABNORMAL LOW (ref 70–99)
Potassium: 3.5 mmol/L (ref 3.5–5.1)
Sodium: 134 mmol/L — ABNORMAL LOW (ref 135–145)

## 2018-10-22 LAB — URINALYSIS, ROUTINE W REFLEX MICROSCOPIC
Bacteria, UA: NONE SEEN
Bilirubin Urine: NEGATIVE
Glucose, UA: 50 mg/dL — AB
Hgb urine dipstick: NEGATIVE
Ketones, ur: NEGATIVE mg/dL
Nitrite: NEGATIVE
Protein, ur: NEGATIVE mg/dL
Specific Gravity, Urine: 1.017 (ref 1.005–1.030)
pH: 7 (ref 5.0–8.0)

## 2018-10-22 LAB — HEMOGLOBIN A1C
Hgb A1c MFr Bld: 6.2 % — ABNORMAL HIGH (ref 4.8–5.6)
Mean Plasma Glucose: 131.24 mg/dL

## 2018-10-22 LAB — AMMONIA: Ammonia: 18 umol/L (ref 9–35)

## 2018-10-22 LAB — RPR: RPR Ser Ql: NONREACTIVE

## 2018-10-22 LAB — MAGNESIUM: Magnesium: 1.4 mg/dL — ABNORMAL LOW (ref 1.7–2.4)

## 2018-10-22 LAB — GLUCOSE, CAPILLARY
Glucose-Capillary: 240 mg/dL — ABNORMAL HIGH (ref 70–99)
Glucose-Capillary: 211 mg/dL — ABNORMAL HIGH (ref 70–99)

## 2018-10-22 MED ORDER — POLYETHYLENE GLYCOL 3350 17 G PO PACK
17.0000 g | PACK | Freq: Every day | ORAL | Status: DC | PRN
  Administered 2018-10-24 – 2018-10-29 (×3): 17 g via ORAL
  Filled 2018-10-22 (×3): qty 1

## 2018-10-22 MED ORDER — ACETAMINOPHEN 650 MG RE SUPP: 650.0000 mg | Freq: Four times a day (QID) | RECTAL | Status: DC | PRN

## 2018-10-22 MED ORDER — SODIUM CHLORIDE 0.9% FLUSH
3.0000 mL | Freq: Two times a day (BID) | INTRAVENOUS | Status: DC
  Administered 2018-10-22 – 2018-10-29 (×6): 3 mL via INTRAVENOUS

## 2018-10-22 MED ORDER — ALPRAZOLAM 0.5 MG PO TABS
1.0000 mg | ORAL_TABLET | Freq: Four times a day (QID) | ORAL | Status: DC | PRN
  Administered 2018-10-22 – 2018-11-01 (×12): 1 mg via ORAL
  Filled 2018-10-22 (×12): qty 2

## 2018-10-22 MED ORDER — METAXALONE 800 MG PO TABS
800.0000 mg | ORAL_TABLET | Freq: Once | ORAL | Status: AC
  Administered 2018-10-22: 800 mg via ORAL
  Filled 2018-10-22: qty 1

## 2018-10-22 MED ORDER — POTASSIUM CHLORIDE CRYS ER 20 MEQ PO TBCR
20.0000 meq | EXTENDED_RELEASE_TABLET | Freq: Once | ORAL | Status: AC
  Administered 2018-10-22: 20 meq via ORAL
  Filled 2018-10-22: qty 1

## 2018-10-22 MED ORDER — INSULIN ASPART 100 UNIT/ML ~~LOC~~ SOLN
0.0000 [IU] | Freq: Every day | SUBCUTANEOUS | Status: DC
  Administered 2018-10-22: 2 [IU] via SUBCUTANEOUS

## 2018-10-22 MED ORDER — ALPRAZOLAM 0.25 MG PO TABS
1.0000 mg | ORAL_TABLET | Freq: Once | ORAL | Status: AC
  Administered 2018-10-22: 1 mg via ORAL
  Filled 2018-10-22: qty 4

## 2018-10-22 MED ORDER — DEXTROSE-NACL 5-0.45 % IV SOLN
INTRAVENOUS | Status: DC
  Administered 2018-10-22: 07:00:00 via INTRAVENOUS

## 2018-10-22 MED ORDER — SODIUM CHLORIDE 0.9 % IV SOLN
250.0000 mL | INTRAVENOUS | Status: DC | PRN
  Administered 2018-10-23 (×2): 250 mL via INTRAVENOUS

## 2018-10-22 MED ORDER — HYDROCODONE-ACETAMINOPHEN 5-325 MG PO TABS
1.0000 | ORAL_TABLET | ORAL | Status: DC | PRN
  Administered 2018-10-22 (×3): 2 via ORAL
  Administered 2018-10-22: 1 via ORAL
  Administered 2018-10-23 – 2018-10-31 (×13): 2 via ORAL
  Filled 2018-10-22 (×5): qty 2
  Filled 2018-10-22: qty 1
  Filled 2018-10-22 (×12): qty 2

## 2018-10-22 MED ORDER — ALPRAZOLAM 0.5 MG PO TABS
1.0000 mg | ORAL_TABLET | ORAL | Status: DC
  Administered 2018-10-22: 1 mg via ORAL
  Filled 2018-10-22: qty 4

## 2018-10-22 MED ORDER — BUPROPION HCL ER (XL) 150 MG PO TB24
300.0000 mg | ORAL_TABLET | Freq: Every day | ORAL | Status: DC
  Administered 2018-10-22 – 2018-11-01 (×11): 300 mg via ORAL
  Filled 2018-10-22 (×11): qty 2

## 2018-10-22 MED ORDER — LEVOTHYROXINE SODIUM 50 MCG PO TABS
50.0000 ug | ORAL_TABLET | Freq: Every day | ORAL | Status: DC
  Administered 2018-10-22 – 2018-11-01 (×11): 50 ug via ORAL
  Filled 2018-10-22 (×11): qty 1

## 2018-10-22 MED ORDER — ATORVASTATIN CALCIUM 40 MG PO TABS
40.0000 mg | ORAL_TABLET | Freq: Every day | ORAL | Status: DC
  Administered 2018-10-22 – 2018-11-01 (×11): 40 mg via ORAL
  Filled 2018-10-22 (×11): qty 1

## 2018-10-22 MED ORDER — ENOXAPARIN SODIUM 40 MG/0.4ML ~~LOC~~ SOLN
40.0000 mg | Freq: Every day | SUBCUTANEOUS | Status: DC
  Administered 2018-10-22 – 2018-10-24 (×3): 40 mg via SUBCUTANEOUS
  Filled 2018-10-22 (×3): qty 0.4

## 2018-10-22 MED ORDER — MAGNESIUM SULFATE 50 % IJ SOLN
3.0000 g | Freq: Once | INTRAVENOUS | Status: AC
  Administered 2018-10-22: 3 g via INTRAVENOUS
  Filled 2018-10-22: qty 6

## 2018-10-22 MED ORDER — METOPROLOL TARTRATE 25 MG PO TABS
25.0000 mg | ORAL_TABLET | Freq: Two times a day (BID) | ORAL | Status: DC
  Administered 2018-10-22 – 2018-11-01 (×21): 25 mg via ORAL
  Filled 2018-10-22 (×21): qty 1

## 2018-10-22 MED ORDER — SODIUM CHLORIDE 0.9% FLUSH: 3.0000 mL | INTRAVENOUS | Status: DC | PRN

## 2018-10-22 MED ORDER — ACETAMINOPHEN 325 MG PO TABS: 650.0000 mg | ORAL_TABLET | Freq: Four times a day (QID) | ORAL | Status: DC | PRN

## 2018-10-22 MED ORDER — ONDANSETRON HCL 4 MG/2ML IJ SOLN
4.0000 mg | Freq: Once | INTRAMUSCULAR | Status: AC
  Administered 2018-10-22: 4 mg via INTRAVENOUS
  Filled 2018-10-22: qty 2

## 2018-10-22 MED ORDER — INSULIN ASPART 100 UNIT/ML ~~LOC~~ SOLN
0.0000 [IU] | Freq: Three times a day (TID) | SUBCUTANEOUS | Status: DC
  Administered 2018-10-22: 3 [IU] via SUBCUTANEOUS
  Administered 2018-10-23: 1 [IU] via SUBCUTANEOUS
  Administered 2018-10-23 (×2): 2 [IU] via SUBCUTANEOUS
  Administered 2018-10-24 (×2): 1 [IU] via SUBCUTANEOUS
  Administered 2018-10-25 – 2018-10-26 (×2): 3 [IU] via SUBCUTANEOUS
  Administered 2018-10-26 – 2018-10-27 (×4): 1 [IU] via SUBCUTANEOUS
  Administered 2018-10-27: 2 [IU] via SUBCUTANEOUS
  Administered 2018-10-28: 1 [IU] via SUBCUTANEOUS
  Administered 2018-10-28: 2 [IU] via SUBCUTANEOUS
  Administered 2018-10-28: 1 [IU] via SUBCUTANEOUS
  Administered 2018-10-29 (×2): 2 [IU] via SUBCUTANEOUS
  Administered 2018-10-30: 3 [IU] via SUBCUTANEOUS
  Administered 2018-10-30 (×2): 2 [IU] via SUBCUTANEOUS
  Administered 2018-10-31: 3 [IU] via SUBCUTANEOUS
  Administered 2018-10-31: 2 [IU] via SUBCUTANEOUS
  Administered 2018-10-31 – 2018-11-01 (×2): 3 [IU] via SUBCUTANEOUS
  Administered 2018-11-01: 2 [IU] via SUBCUTANEOUS

## 2018-10-22 MED ORDER — SODIUM CHLORIDE 0.9% FLUSH
3.0000 mL | Freq: Two times a day (BID) | INTRAVENOUS | Status: DC
  Administered 2018-10-22 – 2018-11-01 (×11): 3 mL via INTRAVENOUS

## 2018-10-22 MED ORDER — GABAPENTIN 100 MG PO CAPS
100.0000 mg | ORAL_CAPSULE | Freq: Three times a day (TID) | ORAL | Status: DC
  Administered 2018-10-22 – 2018-10-24 (×8): 100 mg via ORAL
  Filled 2018-10-22 (×8): qty 1

## 2018-10-22 MED ORDER — DEXTROSE-NACL 5-0.45 % IV SOLN
INTRAVENOUS | Status: AC
  Administered 2018-10-22 – 2018-10-23 (×2): via INTRAVENOUS

## 2018-10-22 NOTE — ED Notes (Addendum)
Pt just now urinated, will see if pt can urinate again shortly for UA

## 2018-10-22 NOTE — Progress Notes (Addendum)
The patient was admitted early this morning after midnight and H&P has been reviewed and I am in current agreement with assessment and plan done by Dr. Mitzi Hansen.  Additional changes the plan of care been made accordingly.  The patient is a 78 year old obese Caucasian female with a past medical history significant for but not limited to insulin-dependent diabetes mellitus, chronic diastolic CHF, hypothyroidism, tobacco abuse, chronic back pain, depression and significant anxiety who presented to the emergency room with confusion and right flank pain after a fall the night before.  Patient is currently very tangential and according to the admitting physician gave conflicting account of her fall that seem to have occurred at the night on 10/20/2018.  She eventually called her family called her family on 814 and treated 1 in the ED for further evaluation.  She was newly confused complaining of severe right flank pain which has worsened after the fall and states that her right leg also hurts now.  Patient has also been reporting that she is seeing visual hallucinations and while in the room states that she was seeing birds in the ceiling.  Upon arrival to the ED she was found to be afebrile and saturating the low 90s and hypertensive on admission.  She had a noncontrast head CT which showed no acute intracranial abnormality or skull fracture and there is no acute fracture or subluxation on cervical spine CT.  A CT of the chest was concerning for a right hilar mass causing occlusion of the right middle lobar pulmonary artery suggestive of bronchogenic carcinoma with tiny nodules at the right apex which could be satellite nodules.  CT of the abdomen pelvis was negative for any other findings.  Patient recently had hypoglycemia in the emergency department was treated with IV dextrose and she is also given a liter of lactated Ringer's morphine, Xanax and a dose of azithromycin and Rocephin.  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 If she synopsized and fell; Check ECHOCardiogram  - She was hypoglycemic, but AMS persists despite treatment of this  - Head CT is negative for acute findings and TSH is normal in ED  - She has right flank with no acute findings on CT abd/pelvis - UA showed clear appearance, yellow in color, with trace leukocytes, negative nitrites, no bacteria seen, 0-5 RBCs per high-power field, 0-5 squamous epithelial cells and 6-10 WBCs - Wanted to Check MRI brain however unfortunately has a [employer] AICD and need to see if it is compatible and will need Rep to Come in.  -Check ammonia, RPR, B12, and folate; continue supportive care   -Will obtain Psych Consult for Visual Hallucinations  -May need to get a CT Head with Contrast if Unable to Get MRI  Hypoglycemia; insulin-dependent DM  - Patient has IDDM with A1c 7.2% in January 2020, treated by EMS for hypoglycemia pta and then received D50% again in ED  -Repeat HbA1c here was 6.2 - Likely secondary to insulin/not eating, continue CBG's, hold Lantus, use low-intensity SSI as needed for now   -CBG's ranging from 55-113  Right Lung Mass  - CTA chest reveals a right hilar mass concerning for bronchogenic carcinoma  - Patient and family are aware of the finding - Next step  will likely be biopsy, MRI brain was ordered but could not be done - Discuss with Pulmonary   Chronic Diastolic CHF  NICM S/p AICD - Appeared hypovolemic in ED with elevated CK and was given a liter of IVF  - Hold Lasix initially, continue beta-blocker as tolerated, and follow daily wts    Hypertension  - BP was elevated in ED and normalized with analgesia  - Continue metoprolol     Hypothyroidism  - TSH  normal in ED, continue Synthroid   Depression, Anxiety  - Continue Wellbutrin and Xanax    Prolonged QT-interval  - QTc is 542 ms in ED  - She has ICD in place  - Replace potassium to 4 and mag to 2, minimize QT-prolonging medications, and repeat EKG in am   Hypomagnesemia -Patient magnesium level this morning was 1.4 -Replete with IV mag sulfate 3 g -Continue monitor output as necessary -Repeat magnesium level in the a.m.  Leukocytosis -In the setting of fall and being on the floor; -Worsened and went from 12.3 -> 14.6 -Continue monitor and trend for signs and symptoms of infection -Repeat CBC in a.m.  Normocytic Anemia -Patient's hemoglobin/hematocrit admission was 11.8/36.0 -Check anemia panel in the a.m. -Continue to monitor for signs and symptoms of bleeding; currently no overt bleeding noted -Repeat CBC in a.m.  Obesity -Estimated body mass index is 31.82 kg/m as calculated from the following:   Height as of this encounter: 5' (1.524 m).   Weight as of this encounter: 73.9 kg. -Weight Loss and Dietary Counseling given   We will continue to monitor patient's clinical response to intervention and follow blood work and imaging studies in the a.m.  Case was discussed with neurology who states that correct underlying issues if she still remains encephalopathic will pursue a EEG.  Currently we will continue IV fluid hydration with D5 half-normal saline at 75 mL's per hour for at least a day.

## 2018-10-22 NOTE — ED Notes (Signed)
Lunch Tray Ordered @ 1040-per Emmy, RN called by Levada Dy

## 2018-10-22 NOTE — Plan of Care (Signed)
  Problem: Activity: Goal: Risk for activity intolerance will decrease Outcome: Progressing   

## 2018-10-22 NOTE — Progress Notes (Addendum)
Spoke to RN pt has a Education officer, community. RN informed MD and we will wait until the morning MD is consulted. Informed RN MD will need to call rep to come in for a stat MRI.

## 2018-10-22 NOTE — ED Notes (Signed)
[phone] [employer] rep nursing staff needs to call and page local rep per MRI

## 2018-10-22 NOTE — ED Notes (Signed)
Tele Ordered bfast 

## 2018-10-22 NOTE — ED Notes (Signed)
In and out cathed patient due to her not being able to use purewick and inability to get out of bed due to the pain she was having in her hip. Emptied out 446ml and UA sent

## 2018-10-22 NOTE — ED Notes (Signed)
Pt's glucose 55. Gave OJ and will recheck in 15 min

## 2018-10-22 NOTE — H&P (Signed)
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 Tiny nodules of the right lung apex could represent satellite nodules. 4.  Aortic atherosclerosis (ICD10-I70.0). 5. 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 Electronically Signed   By: KUlyses JarredM.D.   On: 10/22/2018 00:35   Dg Hip Unilat With Pelvis 2-3 Views Right  Result Date: 10/21/2018 CLINICAL DATA:  Right hip pain following a fall today. EXAM: DG HIP (WITH OR WITHOUT PELVIS) 2-3V RIGHT COMPARISON:  Right femur dated 05/27/2017. FINDINGS: Normal appearing hips without fracture or dislocation. Lower lumbar spine degenerative changes. IMPRESSION: No fracture or dislocation. Electronically Signed   By: SClaudie ReveringM.D.   On: 10/21/2018 20:12    EKG: Independently reviewed. Paced rhythm, QTc 542 ms.   Assessment/Plan   1. Acute encephalopathy  - Presents with confusion and visual hallucinations after falling the  night before and being unable to get up from floor  - She was hypoglycemic, but AMS persists despite treatment of this  - Head CT is negative for acute findings and TSH is normal in ED  - She has right flank with no acute  findings on CT abd/pelvis, UA is pending  - Check MRI brain, ammonia, RPR, B12, and folate; continue supportive care    2. Hypoglycemia; insulin-dependent DM  - Patient has IDDM with A1c 7.2% in January 2020, treated by EMS for hypoglycemia pta and then received D50% again in ED  - Likely secondary to insulin/not eating, continue CBG's, hold Lantus, use low-intensity SSI as needed for now    3. 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 Chronic diastolic CHF  - Appeared hypovolemic in ED with elevated CK and was given a liter of IVF  - Hold Lasix initially, continue beta-blocker as tolerated, and follow daily wts    5. Hypertension  - BP was elevated in ED and normalized with analgesia  - Continue metoprolol     6. Hypothyroidism  - TSH normal in ED, continue Synthroid   7. Depression, anxiety  - Continue Wellbutrin and Xanax    8. Prolonged QT-interval  - QTc is 542 ms in ED  - She has ICD in place  - Replace potassium to 4 and mag to 2, minimize QT-prolonging medications, and repeat EKG in am     PPE: Mask, face shield  DVT prophylaxis: Lovenox  Code Status: Full  Family Communication: Discussed with patient; patient's family was updated by ED physician  Consults called: none  Admission status: Observation     Vianne Bulls, MD [facility] Pager [phone]  If 7PM-7AM, please contact night-coverage [email or web address] Password [facility]  10/22/2018, 3:35 AM

## 2018-10-22 NOTE — ED Notes (Addendum)
Pt unable to get MRI currently due to defibrillator. Admitting MD informed.

## 2018-10-22 NOTE — Progress Notes (Signed)
NEW ADMISSION NOTE New Admission Note:   Arrival Method: Stretcher Mental Orientation: Alert to self Telemetry: BOX [id number] ,Called and confirmed Assessment: Completed Skin:Skin tear right F-ARM 3CM X.5CM XI:PPND Pain:NONE Tubes: Safety Measures: Safety Fall Prevention Plan has been given, discussed but she is confused,bed alarm on most sensitive scale Admission: Completed [facility] Orientation: Patient has been orientated to the room, unit and staff.  Family:  Orders have been reviewed and implemented. Will continue to monitor the patient. Call light has been placed within reach and bed alarm has been activated.   [place], Zenon Mayo, RN

## 2018-10-23 ENCOUNTER — Observation Stay (HOSPITAL_COMMUNITY): Payer: Medicare HMO

## 2018-10-23 ENCOUNTER — Observation Stay (HOSPITAL_BASED_OUTPATIENT_CLINIC_OR_DEPARTMENT_OTHER): Payer: Medicare HMO

## 2018-10-23 DIAGNOSIS — T383X5A Adverse effect of insulin and oral hypoglycemic [antidiabetic] drugs, initial encounter: Secondary | ICD-10-CM | POA: Diagnosis present

## 2018-10-23 DIAGNOSIS — Z6831 Body mass index (BMI) 31.0-31.9, adult: Secondary | ICD-10-CM | POA: Diagnosis not present

## 2018-10-23 DIAGNOSIS — F411 Generalized anxiety disorder: Secondary | ICD-10-CM | POA: Diagnosis present

## 2018-10-23 DIAGNOSIS — I5032 Chronic diastolic (congestive) heart failure: Secondary | ICD-10-CM | POA: Diagnosis present

## 2018-10-23 DIAGNOSIS — J189 Pneumonia, unspecified organism: Secondary | ICD-10-CM | POA: Diagnosis present

## 2018-10-23 DIAGNOSIS — M6282 Rhabdomyolysis: Secondary | ICD-10-CM | POA: Diagnosis present

## 2018-10-23 DIAGNOSIS — R918 Other nonspecific abnormal finding of lung field: Secondary | ICD-10-CM

## 2018-10-23 DIAGNOSIS — G9341 Metabolic encephalopathy: Secondary | ICD-10-CM | POA: Diagnosis present

## 2018-10-23 DIAGNOSIS — G9340 Encephalopathy, unspecified: Secondary | ICD-10-CM | POA: Diagnosis not present

## 2018-10-23 DIAGNOSIS — N179 Acute kidney failure, unspecified: Secondary | ICD-10-CM | POA: Diagnosis present

## 2018-10-23 DIAGNOSIS — I160 Hypertensive urgency: Secondary | ICD-10-CM | POA: Diagnosis present

## 2018-10-23 DIAGNOSIS — E041 Nontoxic single thyroid nodule: Secondary | ICD-10-CM | POA: Diagnosis present

## 2018-10-23 DIAGNOSIS — F4322 Adjustment disorder with anxiety: Secondary | ICD-10-CM | POA: Diagnosis not present

## 2018-10-23 DIAGNOSIS — R9431 Abnormal electrocardiogram [ECG] [EKG]: Secondary | ICD-10-CM | POA: Diagnosis present

## 2018-10-23 DIAGNOSIS — R0602 Shortness of breath: Secondary | ICD-10-CM | POA: Diagnosis not present

## 2018-10-23 DIAGNOSIS — G8929 Other chronic pain: Secondary | ICD-10-CM | POA: Diagnosis present

## 2018-10-23 DIAGNOSIS — E861 Hypovolemia: Secondary | ICD-10-CM | POA: Diagnosis present

## 2018-10-23 DIAGNOSIS — I428 Other cardiomyopathies: Secondary | ICD-10-CM | POA: Diagnosis present

## 2018-10-23 DIAGNOSIS — Z20828 Contact with and (suspected) exposure to other viral communicable diseases: Secondary | ICD-10-CM | POA: Diagnosis present

## 2018-10-23 DIAGNOSIS — R410 Disorientation, unspecified: Secondary | ICD-10-CM | POA: Diagnosis present

## 2018-10-23 DIAGNOSIS — I110 Hypertensive heart disease with heart failure: Secondary | ICD-10-CM | POA: Diagnosis present

## 2018-10-23 DIAGNOSIS — F17210 Nicotine dependence, cigarettes, uncomplicated: Secondary | ICD-10-CM | POA: Diagnosis present

## 2018-10-23 DIAGNOSIS — R441 Visual hallucinations: Secondary | ICD-10-CM | POA: Diagnosis present

## 2018-10-23 DIAGNOSIS — R4182 Altered mental status, unspecified: Secondary | ICD-10-CM | POA: Diagnosis not present

## 2018-10-23 DIAGNOSIS — E11649 Type 2 diabetes mellitus with hypoglycemia without coma: Secondary | ICD-10-CM | POA: Diagnosis present

## 2018-10-23 DIAGNOSIS — E871 Hypo-osmolality and hyponatremia: Secondary | ICD-10-CM | POA: Diagnosis present

## 2018-10-23 DIAGNOSIS — Y92009 Unspecified place in unspecified non-institutional (private) residence as the place of occurrence of the external cause: Secondary | ICD-10-CM | POA: Diagnosis not present

## 2018-10-23 DIAGNOSIS — E039 Hypothyroidism, unspecified: Secondary | ICD-10-CM | POA: Diagnosis present

## 2018-10-23 DIAGNOSIS — C3491 Malignant neoplasm of unspecified part of right bronchus or lung: Secondary | ICD-10-CM | POA: Diagnosis not present

## 2018-10-23 DIAGNOSIS — C342 Malignant neoplasm of middle lobe, bronchus or lung: Secondary | ICD-10-CM | POA: Diagnosis present

## 2018-10-23 DIAGNOSIS — E669 Obesity, unspecified: Secondary | ICD-10-CM | POA: Diagnosis present

## 2018-10-23 DIAGNOSIS — W19XXXA Unspecified fall, initial encounter: Secondary | ICD-10-CM | POA: Diagnosis present

## 2018-10-23 LAB — CBC WITH DIFFERENTIAL/PLATELET
Abs Immature Granulocytes: 0.04 10*3/uL (ref 0.00–0.07)
Basophils Absolute: 0.1 10*3/uL (ref 0.0–0.1)
Basophils Relative: 1 %
Eosinophils Absolute: 0.2 10*3/uL (ref 0.0–0.5)
Eosinophils Relative: 2 %
HCT: 33.6 % — ABNORMAL LOW (ref 36.0–46.0)
Hemoglobin: 10.9 g/dL — ABNORMAL LOW (ref 12.0–15.0)
Immature Granulocytes: 0 %
Lymphocytes Relative: 28 %
Lymphs Abs: 3 10*3/uL (ref 0.7–4.0)
MCH: 29.2 pg (ref 26.0–34.0)
MCHC: 32.4 g/dL (ref 30.0–36.0)
MCV: 90.1 fL (ref 80.0–100.0)
Monocytes Absolute: 1.1 10*3/uL — ABNORMAL HIGH (ref 0.1–1.0)
Monocytes Relative: 10 %
Neutro Abs: 6.2 10*3/uL (ref 1.7–7.7)
Neutrophils Relative %: 59 %
Platelets: 249 10*3/uL (ref 150–400)
RBC: 3.73 MIL/uL — ABNORMAL LOW (ref 3.87–5.11)
RDW: 14.7 % (ref 11.5–15.5)
WBC: 10.7 10*3/uL — ABNORMAL HIGH (ref 4.0–10.5)
nRBC: 0 % (ref 0.0–0.2)

## 2018-10-23 LAB — CK TOTAL AND CKMB (NOT AT ARMC)
CK, MB: 3.9 ng/mL (ref 0.5–5.0)
Relative Index: 1.9 (ref 0.0–2.5)
Total CK: 205 U/L (ref 38–234)

## 2018-10-23 LAB — GLUCOSE, CAPILLARY
Glucose-Capillary: 136 mg/dL — ABNORMAL HIGH (ref 70–99)
Glucose-Capillary: 167 mg/dL — ABNORMAL HIGH (ref 70–99)
Glucose-Capillary: 178 mg/dL — ABNORMAL HIGH (ref 70–99)
Glucose-Capillary: 116 mg/dL — ABNORMAL HIGH (ref 70–99)

## 2018-10-23 LAB — COMPREHENSIVE METABOLIC PANEL
ALT: 19 U/L (ref 0–44)
AST: 26 U/L (ref 15–41)
Albumin: 3.2 g/dL — ABNORMAL LOW (ref 3.5–5.0)
Alkaline Phosphatase: 40 U/L (ref 38–126)
Anion gap: 10 (ref 5–15)
BUN: 18 mg/dL (ref 8–23)
CO2: 25 mmol/L (ref 22–32)
Calcium: 8.5 mg/dL — ABNORMAL LOW (ref 8.9–10.3)
Chloride: 99 mmol/L (ref 98–111)
Creatinine, Ser: 1.41 mg/dL — ABNORMAL HIGH (ref 0.44–1.00)
GFR calc Af Amer: 42 mL/min — ABNORMAL LOW (ref >60)
GFR calc non Af Amer: 36 mL/min — ABNORMAL LOW (ref >60)
Glucose, Bld: 137 mg/dL — ABNORMAL HIGH (ref 70–99)
Potassium: 4 mmol/L (ref 3.5–5.1)
Sodium: 134 mmol/L — ABNORMAL LOW (ref 135–145)
Total Bilirubin: 0.6 mg/dL (ref 0.3–1.2)
Total Protein: 6.1 g/dL — ABNORMAL LOW (ref 6.5–8.1)

## 2018-10-23 LAB — ECHOCARDIOGRAM COMPLETE
Height: 60 in
Weight: 2613.77 oz

## 2018-10-23 LAB — PHOSPHORUS: Phosphorus: 4.4 mg/dL (ref 2.5–4.6)

## 2018-10-23 LAB — MAGNESIUM: Magnesium: 2.1 mg/dL (ref 1.7–2.4)

## 2018-10-23 MED ORDER — DEXTROSE-NACL 5-0.45 % IV SOLN
INTRAVENOUS | Status: DC
  Administered 2018-10-23: 11:00:00 via INTRAVENOUS

## 2018-10-23 NOTE — Evaluation (Signed)
Physical Therapy Evaluation Patient Details Name: Carolyn [name] MRN: [id number] DOB: 17-Nov-1940 Today's Date: 10/23/2018   History of Present Illness    78 yo female smoker with PMHx: DM, diastolic CHF, Hypothyroidism, back pain, anxiety, depression, nephrolithiasis, IBS, insomnia, HLD, Colon polyps, GERD, HTN, s/p AICD, non ischemic CM, found down after fall at home.  Unable to give consistent account of fall and has had visual hallucinations.  CT chest showed lung mass.    Clinical Impression  Pt presents with moderate deficits to mobility due to weakness, decr safety awareness, balance impairments resulting in limitations and fall risk when moving around.  Will benefit from acute PT to address deficits with goal of home with 24 hour assistance.  Currently pt is confused and unable to make safe decisions regarding mobility.  Per CSW note daugther will be able to provide 24 assist, and if so, pt would be appropriate for home with HHPT.  May need a RW (unsure if pt has a device or borrowed one as she is confused).  Recommend up with nursing using RW in room and bed/chair alarms until pt demonstrates awareness of safety.  PT will follow acutely and assist with d/c.      Follow Up Recommendations SNF(pending improved mental status) - if pt has 24 assistance then home with HHPT    Equipment Recommendations  Rolling walker with 5" wheels    Recommendations for Other Services       Precautions / Restrictions Precautions Precautions: Fall Restrictions Weight Bearing Restrictions: No      Mobility  Bed Mobility Overal bed mobility: Needs Assistance Bed Mobility: Supine to Sit     Supine to sit: Min guard;HOB elevated     General bed mobility comments: follows instruction to sit EOB, uses rail, supervision for safety wtih occasional hands on   Transfers Overall transfer level: Needs assistance Equipment used: Rolling walker (2 wheeled) Transfers: Sit to/from Stand Sit to Stand: Min  assist         General transfer comment: needs assist for lift off from low surface (simulates home surface) using RW, bed rail, or window sill from bed/toilet.  Steady in standing, denies leg weakness  Ambulation/Gait Ambulation/Gait assistance: Min assist Gait Distance (Feet): 25 Feet Assistive device: Rolling walker (2 wheeled) Gait Pattern/deviations: Step-through pattern;Decreased stride length;Narrow base of support Gait velocity: slow (measure next session)   General Gait Details: very narrow BOS with very short stride where trail foot does not step past lead foot in gait cycle; extremely slow (but unmeasured) gait speed, and unable to demonstrate dual tasking. Verbal cues to direct to/from bathroom and slow turns using RW  Stairs Stairs: (Pt reports step-to technique at current basline)          Wheelchair Mobility    Modified Rankin (Stroke Patients Only)       Balance Overall balance assessment: Needs assistance;History of Falls Sitting-balance support: No upper extremity supported;Feet supported Sitting balance-Leahy Scale: Fair Sitting balance - Comments: cannot bend forward to don/doff socks   Standing balance support: During functional activity;Bilateral upper extremity supported Standing balance-Leahy Scale: Fair Standing balance comment: appears steady in static but dependent on RW especially for dynamic activities                             Pertinent Vitals/Pain Pain Assessment: No/denies pain    Home Living Family/patient expects to be discharged to:: Private residence Living Arrangements: Alone Available Help  at Discharge: Family;Available PRN/intermittently(need to clarify assist at d/c) Type of Home: House Home Access: Stairs to enter Entrance Stairs-Rails: Can reach both Entrance Stairs-Number of Steps: 5 Home Layout: One level Home Equipment: Environmental consultant - 2 wheels(unclear if RW is patient's or borrowed) Additional Comments: pt  mentions sons but asserts lives alone    Prior Function Level of Independence: Independent         Comments: generally describes independent function, denies use of device regularly; states RLE weaker when ascending stairs (see Stairs below0     Hand Dominance        Extremity/Trunk Assessment   Upper Extremity Assessment Upper Extremity Assessment: Defer to OT evaluation    Lower Extremity Assessment Lower Extremity Assessment: Generalized weakness(per pt RLE weaker but not discernable with MMT)       Communication   Communication: No difficulties(pt is very talkative)  Cognition Arousal/Alertness: Awake/alert Behavior During Therapy: WFL for tasks assessed/performed(distractable) Overall Cognitive Status: Impaired/Different from baseline Area of Impairment: Memory;Safety/judgement;Awareness                     Memory: Decreased short-term memory   Safety/Judgement: Decreased awareness of safety;Decreased awareness of deficits Awareness: Intellectual(less than... insists she can get up alone despite education)   General Comments: Very talkative to the point of distraction, need to talk over her from time to time to prompt her to continue with current task. Pt oriented to person, place, time; demonstrates less than intellectual awareness as PT and OT educated on risk of fall/injury if getting up in room, pt agrees, but when asked how she'll go back to bed after lunch, pt states "I'll get up and put my butt rigtht there on the bed." CNA alerted.  No chair alarm in room, food tray placed behind one wheel of recliner for safety and door left open.        General Comments General comments (skin integrity, edema, etc.): No indication of hallucinations, but pt remains confused and distractable, unable to recall safety instruction, and poses fall riskt    Exercises     Assessment/Plan    PT Assessment Patient needs continued PT services  PT Problem List Decreased  safety awareness;Decreased knowledge of use of DME;Decreased cognition;Decreased knowledge of precautions;Decreased mobility;Decreased balance;Decreased range of motion;Decreased strength       PT Treatment Interventions DME instruction;Gait training;Stair training;Functional mobility training;Therapeutic activities;Therapeutic exercise;Balance training;Cognitive remediation;Patient/family education    PT Goals (Current goals can be found in the Care Plan section)  Acute Rehab PT Goals Patient Stated Goal: home PT Goal Formulation: With patient Time For Goal Achievement: 11/06/18 Potential to Achieve Goals: Good    Frequency Min 3X/week   Barriers to discharge Decreased caregiver support unclear whether family can provide 24 assist    Co-evaluation PT/OT/SLP Co-Evaluation/Treatment: Yes Reason for Co-Treatment: Necessary to address cognition/behavior during functional activity PT goals addressed during session: Mobility/safety with mobility;Proper use of DME         AM-PAC PT "6 Clicks" Mobility  Outcome Measure Help needed turning from your back to your side while in a flat bed without using bedrails?: None Help needed moving from lying on your back to sitting on the side of a flat bed without using bedrails?: A Little Help needed moving to and from a bed to a chair (including a wheelchair)?: A Little Help needed standing up from a chair using your arms (e.g., wheelchair or bedside chair)?: A Little Help needed to walk in hospital room?: A  Little Help needed climbing 3-5 steps with a railing? : A Lot 6 Click Score: 18    End of Session Equipment Utilized During Treatment: Gait belt Activity Tolerance: Patient tolerated treatment well Patient left: in chair;with call bell/phone within reach Nurse Communication: Mobility status PT Visit Diagnosis: Difficulty in walking, not elsewhere classified (R26.2);Muscle weakness (generalized) (M62.81);History of falling (Z91.81)     Time: 9292-4462 PT Time Calculation (min) (ACUTE ONLY): 40 min   Charges:   PT Evaluation $PT Eval Low Complexity: 1 Low PT Treatments $Gait Training: 8-22 mins        Kearney Hard, PT, DPT, MS Board Certified Geriatric Clinical Specialist  Herbie Drape 10/23/2018, 1:55 PM

## 2018-10-23 NOTE — Progress Notes (Addendum)
PROGRESS NOTE    Carolyn [name]  SNK:[id number] DOB: 1940/09/16 DOA: 10/21/2018 PCP: Biagio Borg, MD   Brief Narrative:  The patient is a 78 year old obese Caucasian female with a past medical history significant for but not limited to insulin-dependent diabetes mellitus, chronic diastolic CHF, hypothyroidism, tobacco abuse, chronic back pain, depression and significant anxiety who presented to the emergency room with confusion and right flank pain after a fall the night before.  Patient is currently very tangential and according to the admitting physician gave conflicting account of her fall that seem to have occurred at the night on 10/20/2018.  She eventually called her family called her family on 814 and treated 1 in the ED for further evaluation.  She was newly confused complaining of severe right flank pain which has worsened after the fall and states that her right leg also hurts now.  Patient has also been reporting that she is seeing visual hallucinations and while in the room states that she was seeing birds in the ceiling.  Upon arrival to the ED she was found to be afebrile and saturating the low 90s and hypertensive on admission.  She had a noncontrast head CT which showed no acute intracranial abnormality or skull fracture and there is no acute fracture or subluxation on cervical spine CT.  A CT of the chest was concerning for a right hilar mass causing occlusion of the right middle lobar pulmonary artery suggestive of bronchogenic carcinoma with tiny nodules at the right apex which could be satellite nodules.  CT of the abdomen pelvis was negative for any other findings.  Patient recently had hypoglycemia in the emergency department was treated with IV dextrose and she is also given a liter of lactated Ringer's morphine, Xanax and a dose of azithromycin and Rocephin.  **Interim History Remained confused today but was more alert today.  Psychiatry evaluated Dr. Mariea Clonts recommending starting  trazodone 50 mg nightly for insomnia and increasing gabapentin from 100 mg 3 times daily to 200 mg 3 times daily for anxiety and recommending continuing Xanax 1 mg 4 times daily and recommending a gradual taper given risk for falls.  Patient has the capacity to consent for SNF placement and Dr. Mariea Clonts recommending having a social worker provide resources for outpatient psychiatrist agreed counseling.  Renal function worsened and now she has an AKI but after IV fluid hydration there is improved.  PT OT evaluated and recommending a skilled nursing facility as she lacked awareness of safety yesterday but will have PT OT reevaluate again.  Leukocytosis is trending down.  Pulmonary evaluated and they are recommending a bronchoscopy and biopsy and likely this will be done tomorrow. Unfortunately MRI not be done as AICD is not compatible.  Assessment & Plan:   Principal Problem:   Acute encephalopathy Active Problems:   Hypothyroidism   Insulin dependent diabetes mellitus (HCC)   Anxiety state   Depression   Essential hypertension, benign   Chronic lower back pain   Prolonged QT interval   Chronic diastolic CHF (congestive heart failure) (HCC)   Hypoglycemia due to insulin   Mass of right lung   Hypertensive urgency   AMS (altered mental status)  Acute Encephalopathylikely related to Hypoglycemia and ? suspected Bronchogenic Carcinoma and Recent Fall; encephalopathy is improving likely with medication induced -Presents with confusion and visual hallucinations after falling the night before and being unable to get up from floor; still not back to baseline and remains somewhat confused again today  - ?  If she synopsized and fell; Check ECHOCardiogram  -She was hypoglycemic, but AMS persists despite treatment of this -Head CT is negative for acute findings and TSH is normal in ED -She has right flank with no acute findings on CT abd/pelvis - UA showed clear appearance, yellow in color, with  trace leukocytes, negative nitrites, no bacteria seen, 0-5 RBCs per high-power field, 0-5 squamous epithelial cells and 6-10 WBCs -Wanted to Check MRI brain however unfortunately has a [employer] AICD and need to see if it is compatible and will need Rep to Come in.  This is not compatible -Check Ammonia 18, RPR was Non-Reactive, B12 314, and folate pending; -Continue supportive care -Will obtain Psych Consult for Visual Hallucinations and spoke with Dr. Tiburcio Pea who states Psychiatry will see the pateint tomorrow; psychiatry evaluated and Dr. Mariea Clonts evaluated and feels that are likely related to her medications specifically the Requip.  Dr. Lucia Gaskins has also made some recommendations and has been recommending increasing her gabapentin to 200 mg 3 times daily and tapering down her alprazolam given the risk of falls as well as starting the patient on trazodone 50 mg p.o. nightly as needed -May need to get a CT Head with Contrast now that it is confirmed that she is Unable to Get MRI but Renal Fxn severely worsened; now that renal function is improved and mentation is improving we will hold off on getting this -Patient was a little Hyponatremic yesterday but this is improved and sodium is now 142  -Continue to monitor extremely closely -Placed on delirium precautions  Fall and Generalized Weakness -PT/OT recommending SNF -PT evaluated and discussed she remained confused she does not demonstrate awareness of safety; She was admitted as observation but because of her continued confusion and lack of awareness of safety will obtain a psychiatric evaluation and change the patient inpatient status and continue IV fluid hydration for AKI. -Continue to monitor and have PT OT reevaluate  Hypoglycemia; Insulin-Dependent DM -Patient has IDDM with A1c 7.2% in January 2020, treated by EMS for hypoglycemia PTA and then received D50% again in ED -Repeat HbA1c here was 6.2 -Likely secondary to insulin/not eating,  continue CBG's, hold Lantus, use low-intensity SSI as needed for now -CBG's ranging from 74-240 -Now discontinued with D5 half-normal saline rate of 75 mL's per hour as her mental status is improving  Right Lung Mass -CTA chest reveals a Right Hilar Mass concerning for bronchogenic carcinoma -Patient and family are aware of the finding -Next step will likely be Biopsy, MRI brain was ordered but could not be done due to  -Discussed with Pulmonary and they are going to arrange Bronchoscopy once Cardiac Assessment is completed; biopsy is likely going to be tomorrow and risks were discussed patient by Dr. Halford Chessman  AKI -Renal function worsened and BUN/creatinine went from 14/0.81 is now 18/1.41.  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  Chronic Diastolic CHF NICM S/p AICD -Appeared hypovolemic in ED with elevated CK and was given a liter of IVF -Hold Lasix initially, continue  beta-blocker as tolerated, and follow daily wts -Patient is +2. 385 liters and Weight is +1 lb -C/w D5 1/2 NS at 75 mL/hr given AKI -Checked echocardiogram and showed an EF of 50 to 28% and diastolic dysfunction as left ventricular diastolic Doppler parameters were consistent with pseudonormalization -Continue to Monitor Volume Status Carefully   Hypertension -BP was elevated in ED and normalized with analgesia; again was elevated today at 181/84 -Continue metoprololTartrate 25 mg po BID  -We will add IV  hydralazine 10 mg every 6 PRN for systolic blood pressure greater than 366 or diastolic blood pressure greater than 100  Hypothyroidism -TSH normal in ED - C/w Levothyroxine 50 mcg po Daily   Depression, Anxiety -Continue Bupropion 300 mg po Daily and Alprazolam 1 mg po 4 Times dailyprn Anxiety  -Dr. Mariea Clonts recommending continuing gabapentin but increasing it to 200 mg 3 times daily -Dr. Mariea Clonts also recommend starting trazodone 50 mg p.o. nightly for sleep  Prolonged QT-interval -QTc is 542 ms in ED -She has ICD in place -Replace potassium to 4 and mag to 2, minimize QT-prolonging medications, and repeat EKG in am   Hypomagnesemia -Patient magnesium level was 1.4 and now is 1.8 -Continue monitor output as necessary -Repeat magnesium level in the a.m.  Leukocytosis, improving  -In the setting of fall and being on the floor; -Worsened initially and went from 12.3 -> 14.6; Repeat this AM was 9.0 -Continue monitor and trend for signs and symptoms of infection; IV Abx have now stopped -Repeat CBC in a.m.  Normocytic Anemia -Patient's hemoglobin/hematocrit admission was 11.8/36.0; Now it is 11.0/34.7 -Checked anemia panel and showed an iron level of 55, TIBC of 225, TIBC of 280, saturation ratios of 20%, ferritin level 68, folate level of 10.7, and vitamin B12 of 323 -Continue to monitor for signs and symptoms of bleeding; currently no overt bleeding noted -Repeat CBC in a.m.  Obesity -Estimated body mass index is 31.9 kg/m as calculated from the following:   Height as of this encounter: 5' (1.524 m).   Weight as of this encounter: 74.1 kg. -Weight Loss and Dietary Counseling given   DVT prophylaxis: Enxoaparin 40 mg sq q24h Code Status: FULL CODE  Family Communication: No family present at bedside  Disposition Plan: SNF if patient and family is agreeable; she is going for biopsy tomorrow and likely can be discharged next 5 4 to 48 hours if stable  Consultants:     Pulmonary  Psychiatry  Discussed Case with Neurology    Procedures:  ECHOCARDIOGRAM IMPRESSIONS    1. The left ventricle has low normal systolic function, with an ejection fraction of 50-55%. The cavity size was normal. There is mildly increased left ventricular wall thickness. Left ventricular diastolic Doppler parameters are consistent with  pseudonormalization. Elevated left atrial and left ventricular end-diastolic pressures The E/e' is >15. No evidence of left ventricular regional wall motion abnormalities.  2. The right ventricle has normal systolic function. The cavity was normal. There is no increase in right ventricular wall thickness.  3. The mitral valve is grossly normal.  4. The tricuspid valve is grossly normal.  5. The aortic valve is grossly normal. No stenosis of the aortic valve.  6. The aorta is normal unless otherwise noted.  SUMMARY   LVEF 50-55%, mild LVH, mild inferior hypokinesis, RV Pacer or AICD, grade 2 DD, elevated LV filling pressure, normal IVC  FINDINGS  Left Ventricle: The left ventricle has low normal systolic function, with an ejection fraction of  50-55%. The cavity size was normal. There is mildly increased left ventricular wall thickness. Left ventricular diastolic Doppler parameters are consistent  with pseudonormalization. Elevated left atrial and left ventricular end-diastolic pressures The E/e' is >15. No evidence of left ventricular regional wall motion abnormalities..  Right Ventricle: The right ventricle has normal systolic function. The cavity was normal. There is no increase in right ventricular wall thickness. Pacing wire/catheter visualized in the right ventricle.  Left Atrium: Left atrial size was normal in size.  Right Atrium: Right atrial size was normal in size. Right atrial pressure is estimated at 10 mmHg.  Interatrial Septum: No atrial level shunt detected by color flow Doppler.  Pericardium: There is no evidence of  pericardial effusion.  Mitral Valve: The mitral valve is grossly normal. Mitral valve regurgitation is trivial by color flow Doppler.  Tricuspid Valve: The tricuspid valve is grossly normal. Tricuspid valve regurgitation is trivial by color flow Doppler.  Aortic Valve: The aortic valve is grossly normal Aortic valve regurgitation was not visualized by color flow Doppler. There is No stenosis of the aortic valve.  Pulmonic Valve: The pulmonic valve was grossly normal. Pulmonic valve regurgitation is not visualized by color flow Doppler.  Aorta: The aorta is normal unless otherwise noted.  Venous: The inferior vena cava is normal in size with greater than 50% respiratory variability.    +--------------+--------++  LEFT VENTRICLE            +----------------+---------++ +--------------+--------++  Diastology                    PLAX 2D                   +----------------+---------++ +--------------+--------++  LV e' lateral:   8.05 cm/s    LVIDd:         4.70 cm    +----------------+---------++ +--------------+--------++  LV E/e' lateral: 11.6         LVIDs:         3.50 cm    +----------------+---------++ +--------------+--------++  LV e' medial:    5.87 cm/s    LV PW:         1.10 cm    +----------------+---------++ +--------------+--------++  LV E/e' medial:  15.9         LV IVS:        0.70 cm    +----------------+---------++ +--------------+--------++  LVOT diam:     2.10 cm    +--------------+--------++  LV SV:         51 ml      +--------------+--------++  LV SV Index:   28.57      +--------------+--------++  LVOT Area:     3.46 cm   +--------------+--------++                            +--------------+--------++  +---------------+----------++  RIGHT VENTRICLE              +---------------+----------++  RV S prime:     13.20 cm/s   +---------------+----------++  RVSP:           35.5 mmHg     +---------------+----------++  +---------------+-------++-----------++  LEFT ATRIUM              Index         +---------------+-------++-----------++  LA diam:        3.80 cm  2.22 cm/m    +---------------+-------++-----------++  LA Vol (A2C):   42.4  ml  24.75 ml/m   +---------------+-------++-----------++  LA Vol (A4C):   46.4 ml  27.09 ml/m   +---------------+-------++-----------++  LA Biplane Vol: 47.7 ml  27.85 ml/m   +---------------+-------++-----------++ +------------+---------++-----------++  RIGHT ATRIUM            Index         +------------+---------++-----------++  RA Pressure: 3.00 mmHg                +------------+---------++-----------++  RA Area:     12.80 cm                +------------+---------++-----------++  RA Volume:   28.60 ml   16.70 ml/m   +------------+---------++-----------++  +------------+-----------++  AORTIC VALVE               +------------+-----------++  LVOT Vmax:   115.00 cm/s   +------------+-----------++  LVOT Vmean:  71.700 cm/s   +------------+-----------++  LVOT VTI:    0.224 m       +------------+-----------++   +-------------+-------++  AORTA                   +-------------+-------++  Ao Root diam: 3.30 cm   +-------------+-------++  Ao Asc diam:  3.00 cm   +-------------+-------++  +--------------+----------++ +---------------+-----------++  MITRAL VALVE                 TRICUSPID VALVE               +--------------+----------++ +---------------+-----------++  MV Area (PHT): 3.21 cm      TR Peak grad:   32.5 mmHg     +--------------+----------++ +---------------+-----------++  MV PHT:        68.44 msec    TR Vmax:        285.00 cm/s   +--------------+----------++ +---------------+-----------++  MV Decel Time: 236 msec      Estimated RAP:  3.00 mmHg     +--------------+----------++ +---------------+-----------++ +--------------+----------++  RVSP:           35.5 mmHg      MV E velocity: 93.40 cm/s    +---------------+-----------++ +--------------+----------++  MV A velocity: 64.30 cm/s   +--------------+-------+ +--------------+----------++  SHUNTS                   MV E/A ratio:  1.45         +--------------+-------+ +--------------+----------++  Systemic VTI:  0.22 m                                +--------------+-------+                               Systemic Diam: 2.10 cm                               +--------------+-------+   Antimicrobials:  Anti-infectives (From admission, onward)   Start     Dose/Rate Route Frequency Ordered Stop   10/21/18 2245  cefTRIAXone (ROCEPHIN) 1 g in sodium chloride 0.9 % 100 mL IVPB     1 g 200 mL/hr over 30 Minutes Intravenous  Once 10/21/18 2235 10/22/18 0005   10/21/18 2245  azithromycin (ZITHROMAX) 500 mg in sodium chloride 0.9 % 250 mL IVPB     500 mg 250 mL/hr over 60 Minutes Intravenous  Once 10/21/18 2235 10/22/18 0204  Subjective: Seen and examined at bedside she appeared better today and was sitting in the chair more awake and coherent speech was not as pressured.  No chest pain, lightheadedness or dizziness.  States she is not in as much pain today.  No other concerns or complaints at this time and she is more alert and not as confused..  Objective: Vitals:   10/22/18 1615 10/22/18 2106 10/23/18 0452 10/23/18 0832  BP: (!) 147/57 (!) 127/55 122/65 (!) 142/60  Pulse: 64 61 (!) 59 60  Resp: 20 20 18 18   Temp: 98 F (36.7 C) 97.7 F (36.5 C) 98.1 F (36.7 C) 98.7 F (37.1 C)  TempSrc: Oral Oral Oral Oral  SpO2: 95% 98% 97% 96%  Weight: 74.1 kg     Height: 5' (1.524 m)       Intake/Output Summary (Last 24 hours) at 10/23/2018 1551 Last data filed at 10/23/2018 1500 Gross per 24 hour  Intake 1657.63 ml  Output 400 ml  Net 1257.63 ml   Filed Weights   10/21/18 2302 10/22/18 1615  Weight: 73.9 kg 74.1 kg    Examination: Physical Exam:  Constitutional: Well-nourished, well-developed obese Caucasian female currently  in no acute distress appears calm and comfortable sitting in the chair bedside Eyes: Lids and conjunctive are normal.  Sclera anicteric ENMT: External ears nose appear normal.  Grossly normal hearing Neck: Appears supple no JVD Respiratory: Slightly diminished auscultation bilaterally with no appreciable wheezing, rales, rhonchi.  Patient not tachypneic wheezing accessory muscle breathe Cardiovascular: Regular rate and rhythm.  No appreciable murmurs, rubs, gallops had very little extremity edema Abdomen: Soft, nontender, distended secondary body habitus.  Bowel sounds present GU: Deferred Musculoskeletal: No contractures or cyanosis.  No joint deformities in the upper lower extremities Skin: Skin is warm and dry no appreciable rashes or lesions on to skin evaluation t Neurologic: Cranial nerves II through XII grossly intact no appreciable focal deficits.  Romberg sign cerebellar reflexes were not assessed Psychiatric: Normal judgment and insight.  Patient is awake, alert, oriented.  Not as confused today.  Slightly anxious  Data Reviewed: I have personally reviewed following labs and imaging studies  CBC: Recent Labs  Lab 10/21/18 2154 10/22/18 0530 10/23/18 0329  WBC 12.3* 14.6* 10.7*  NEUTROABS 9.9* 9.1* 6.2  HGB 11.6* 11.8* 10.9*  HCT 35.5* 36.0 33.6*  MCV 89.9 88.2 90.1  PLT 233 270 814   Basic Metabolic Panel: Recent Labs  Lab 10/21/18 2154 10/22/18 0530 10/23/18 0329  NA 137 134* 134*  K 3.7 3.5 4.0  CL 103 100 99  CO2 23 22 25   GLUCOSE 88 63* 137*  BUN 14 13 18   CREATININE 0.81 0.94 1.41*  CALCIUM 9.4 9.4 8.5*  MG  --  1.4* 2.1  PHOS  --   --  4.4   GFR: Estimated Creatinine Clearance: 30.5 mL/min (A) (by C-G formula based on SCr of 1.41 mg/dL (H)). Liver Function Tests: Recent Labs  Lab 10/21/18 2154 10/23/18 0329  AST 36 26  ALT 21 19  ALKPHOS 43 40  BILITOT 0.5 0.6  PROT 6.4* 6.1*  ALBUMIN 3.4* 3.2*   No results for input(s): LIPASE, AMYLASE in  the last 168 hours. Recent Labs  Lab 10/22/18 0530  AMMONIA 18   Coagulation Profile: Recent Labs  Lab 10/21/18 2154  INR 1.1   Cardiac Enzymes: Recent Labs  Lab 10/21/18 2154 10/23/18 1024  CKTOTAL 517* 205  CKMB  --  3.9   BNP (last 3  results) No results for input(s): PROBNP in the last 8760 hours. HbA1C: Recent Labs    10/22/18 0530  HGBA1C 6.2*   CBG: Recent Labs  Lab 10/22/18 1233 10/22/18 1715 10/22/18 2106 10/23/18 0714 10/23/18 1146  GLUCAP 74 240* 211* 136* 167*   Lipid Profile: No results for input(s): CHOL, HDL, LDLCALC, TRIG, CHOLHDL, LDLDIRECT in the last 72 hours. Thyroid Function Tests: Recent Labs    10/21/18 2154  TSH 1.407   Anemia Panel: Recent Labs    10/22/18 0530  VITAMINB12 314   Sepsis Labs: Recent Labs  Lab 10/21/18 2154  LATICACIDVEN 1.1    Recent Results (from the past 240 hour(s))  SARS Coronavirus 2 [facility] order, Performed in [place] hospital lab)     Status: None   Collection Time: 10/22/18  1:03 AM  Result Value Ref Range Status   SARS Coronavirus 2 NEGATIVE NEGATIVE Final    Comment: (NOTE) If result is NEGATIVE SARS-CoV-2 target nucleic acids are NOT DETECTED. The SARS-CoV-2 RNA is generally detectable in upper and lower  respiratory specimens during the acute phase of infection. The lowest  concentration of SARS-CoV-2 viral copies this assay can detect is 250  copies / mL. A negative result does not preclude SARS-CoV-2 infection  and should not be used as the sole basis for treatment or other  patient management decisions.  A negative result may occur with  improper specimen collection / handling, submission of specimen other  than nasopharyngeal swab, presence of viral mutation(s) within the  areas targeted by this assay, and inadequate number of viral copies  (<250 copies / mL). A negative result must be combined with clinical  observations, patient history, and epidemiological information. If  result is POSITIVE SARS-CoV-2 target nucleic acids are DETECTED. The SARS-CoV-2 RNA is generally detectable in upper and lower  respiratory specimens dur ing the acute phase of infection.  Positive  results are indicative of active infection with SARS-CoV-2.  Clinical  correlation with patient history and other diagnostic information is  necessary to determine patient infection status.  Positive results do  not rule out bacterial infection or co-infection with other viruses. If result is PRESUMPTIVE POSTIVE SARS-CoV-2 nucleic acids MAY BE PRESENT.   A presumptive positive result was obtained on the submitted specimen  and confirmed on repeat testing.  While 2019 novel coronavirus  (SARS-CoV-2) nucleic acids may be present in the submitted sample  additional confirmatory testing may be necessary for epidemiological  and / or clinical management purposes  to differentiate between  SARS-CoV-2 and other Sarbecovirus currently known to infect humans.  If clinically indicated additional testing with an alternate test  methodology [phone]) is advised. The SARS-CoV-2 RNA is generally  detectable in upper and lower respiratory sp ecimens during the acute  phase of infection. The expected result is Negative. Fact Sheet for Patients:  StrictlyIdeas.no Fact Sheet for Healthcare Providers: BankingDealers.co.za This test is not yet approved or cleared by the [place] FDA and has been authorized for detection and/or diagnosis of SARS-CoV-2 by FDA under an Emergency Use Authorization (EUA).  This EUA will remain in effect (meaning this test can be used) for the duration of the COVID-19 declaration under Section 564(b)(1) of the Act, 21 U.S.C. section 360bbb-3(b)(1), unless the authorization is terminated or revoked sooner. Performed at [facility], [address], [place], [place] [address]     Radiology Studies: Dg Ribs Unilateral  W/chest Right  Result Date: 10/21/2018 CLINICAL DATA:  Right flank  pain following a fall today. EXAM: RIGHT RIBS AND CHEST - 3+ VIEW COMPARISON:  Chest dated 11/08/2017. FINDINGS: Stable enlarged cardiac silhouette. Interval increased density in the right middle lobe with obscuration of a portion of the right heart border. Stable left subclavian pacer and AICD leads. Clear left lung. Normal vascularity. No pneumothorax. No rib fracture seen. Thoracic and cervical spine degenerative changes. IMPRESSION: 1. No rib fracture seen. 2. Interval right middle lobe atelectasis or pneumonia. 3. Stable cardiomegaly. Electronically Signed   By: Claudie Revering M.D.   On: 10/21/2018 20:09   Dg Thoracic Spine 2 View  Result Date: 10/21/2018 CLINICAL DATA:  Right flank pain following a fall today. EXAM: THORACIC SPINE 2 VIEWS COMPARISON:  05/27/2017. FINDINGS: Degenerative spur formation throughout the lumbar and lower cervical spine without significant change. No fractures or subluxations are seen. IMPRESSION: No fracture or subluxation. Degenerative changes. Electronically Signed   By: Claudie Revering M.D.   On: 10/21/2018 20:13   Dg Lumbar Spine 2-3 Views  Result Date: 10/21/2018 CLINICAL DATA:  Right flank and right hip pain following a fall today. EXAM: LUMBAR SPINE - 2-3 VIEW COMPARISON:  05/27/2017. FINDINGS: Five non-rib-bearing lumbar vertebrae. Mild to moderate anterior and lateral spur formation at multiple levels of the lumbar and lower thoracic spine, without significant change. Stable facet degenerative changes throughout the lumbar spine with stable associated grade 1 anterolisthesis at the L4-5 level. No fractures, pars defects or acute subluxations. Atheromatous arterial calcifications without visible aneurysm. IMPRESSION: Stable degenerative changes. No acute abnormality. Electronically Signed   By: Claudie Revering M.D.   On: 10/21/2018 20:11   Ct Head Wo Contrast  Result Date: 10/21/2018 CLINICAL DATA:   Head trauma, minor, GCS>=13, high clinical risk, initial exam; C-spine trauma, high clinical risk (NEXUS/CCR). Unwitnessed fall. EXAM: CT HEAD WITHOUT CONTRAST CT CERVICAL SPINE WITHOUT CONTRAST TECHNIQUE: Multidetector CT imaging of the head and cervical spine was performed following the standard protocol without intravenous contrast. Multiplanar CT image reconstructions of the cervical spine were also generated. COMPARISON:  None. FINDINGS: CT HEAD FINDINGS Brain: No intracranial hemorrhage, mass effect, or midline shift. Age related atrophy. Moderate chronic small vessel ischemia. Remote lacunar infarcts in the right basal ganglia and caudate. No hydrocephalus. The basilar cisterns are patent. No evidence of territorial infarct or acute ischemia. No extra-axial or intracranial fluid collection. Vascular: Atherosclerosis of skullbase vasculature without hyperdense vessel or abnormal calcification. Skull: No fracture or focal lesion. Sinuses/Orbits: Paranasal sinuses and mastoid air cells are clear. The visualized orbits are unremarkable. Other: None. CT CERVICAL SPINE FINDINGS Alignment: Straightening of normal lordosis. No traumatic subluxation. Skull base and vertebrae: No acute fracture. Vertebral body heights are maintained. The dens and skull base are intact. Soft tissues and spinal canal: No prevertebral fluid or swelling. No visible canal hematoma. Disc levels: Diffuse disc space narrowing and endplate spurring. Multilevel facet hypertrophy. Upper chest: No acute findings. Other: Advanced carotid calcifications. Prior right thyroidectomy. IMPRESSION: 1. No acute intracranial abnormality. No skull fracture. 2. Age related atrophy, chronic small vessel ischemia and remote right basal ganglia and caudate lacunar infarcts. 3. Multilevel degenerative change in the cervical spine without acute fracture or subluxation. 4. Carotid and skullbase atherosclerosis. Electronically Signed   By: Keith Rake M.D.    On: 10/21/2018 22:50   Ct Angio Chest Pe W And/or Wo Contrast  Result Date: 10/22/2018 CLINICAL DATA:  Shortness of breath EXAM: CT ANGIOGRAPHY CHEST CT ABDOMEN AND PELVIS WITH CONTRAST TECHNIQUE: Multidetector CT imaging of the  chest was performed using the standard protocol during bolus administration of intravenous contrast. Multiplanar CT image reconstructions and MIPs were obtained to evaluate the vascular anatomy. Multidetector CT imaging of the abdomen and pelvis was performed using the standard protocol during bolus administration of intravenous contrast. CONTRAST:  5mL OMNIPAQUE IOHEXOL 350 MG/ML SOLN COMPARISON:  CT abdomen pelvis 12/04/2014 FINDINGS: CTA CHEST FINDINGS Cardiovascular: Contrast injection is sufficient to demonstrate satisfactory opacification of the pulmonary arteries to the segmental level.There is a mass abutting the origin of the right middle lobar pulmonary artery, causing abrupt occlusion. There is no discrete pulmonary embolus. The left pulmonary artery tree is normal. Mediastinum/Nodes: No mediastinal, hilar or axillary lymphadenopathy. The visualized thyroid and thoracic esophageal course are unremarkable. Lungs/Pleura: Right hilar mass measures 3.3 x 3.5 cm. There are smaller nodules in the right upper lobe (series 6, image 42). There is a 4 mm subpleural nodule in the left lower lobe (image 102). Musculoskeletal: No chest wall abnormality. No acute or significant osseous findings. Review of the MIP images confirms the above findings. CT ABDOMEN and PELVIS FINDINGS Hepatobiliary: Normal hepatic contours and density. No visible biliary dilatation. Normal gallbladder. Pancreas: Normal contours without ductal dilatation. No peripancreatic fluid collection. Spleen: Normal. Adrenals/Urinary Tract: --Adrenal glands: Left adrenal nodule measures 16 mm with an attenuation value of 36 HU. --Right kidney/ureter: No hydronephrosis or perinephric stranding. No nephrolithiasis. No  obstructing ureteral stones. --Left kidney/ureter: Status post left nephrectomy --Urinary bladder: Unremarkable. Stomach/Bowel: --Stomach/Duodenum: No hiatal hernia or other gastric abnormality. Normal duodenal course and caliber. --Small bowel: No dilatation or inflammation. --Colon: No focal abnormality. --Appendix: Surgically absent. Vascular/Lymphatic: Atherosclerotic calcification is present within the non-aneurysmal abdominal aorta, without hemodynamically significant stenosis. No abdominal or pelvic lymphadenopathy. Reproductive: No free fluid in the pelvis. Musculoskeletal. No bony spinal canal stenosis or focal osseous abnormality. Other: None. IMPRESSION: 1. Right hilar mass causing occlusion of the right middle lobar pulmonary artery. This is most consistent with bronchogenic carcinoma. 2. Otherwise patent pulmonary arteries. 3. Tiny nodules of the right lung apex could represent satellite nodules. 4.  Aortic atherosclerosis (ICD10-I70.0). 5. 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 Electronically Signed   By: Ulyses Jarred M.D.   On: 10/22/2018 00:35   Ct Cervical Spine Wo Contrast  Result Date: 10/21/2018 CLINICAL DATA:  Head trauma, minor, GCS>=13, high clinical risk, initial exam; C-spine trauma, high clinical risk (NEXUS/CCR). Unwitnessed fall. EXAM: CT HEAD WITHOUT CONTRAST CT CERVICAL SPINE WITHOUT CONTRAST TECHNIQUE: Multidetector CT imaging of the head and cervical spine was performed following the standard protocol without intravenous contrast. Multiplanar CT image reconstructions of the cervical spine were also generated. COMPARISON:  None. FINDINGS: CT HEAD FINDINGS Brain: No intracranial hemorrhage, mass effect, or midline shift. Age related atrophy. Moderate chronic small vessel ischemia. Remote lacunar infarcts in the right basal ganglia and caudate. No hydrocephalus. The basilar cisterns are patent. No evidence of territorial infarct or acute ischemia. No  extra-axial or intracranial fluid collection. Vascular: Atherosclerosis of skullbase vasculature without hyperdense vessel or abnormal calcification. Skull: No fracture or focal lesion. Sinuses/Orbits: Paranasal sinuses and mastoid air cells are clear. The visualized orbits are unremarkable. Other: None. CT CERVICAL SPINE FINDINGS Alignment: Straightening of normal lordosis. No traumatic subluxation. Skull base and vertebrae: No acute fracture. Vertebral body heights are maintained. The dens and skull base are intact. Soft tissues and spinal canal: No prevertebral fluid or swelling. No visible canal hematoma. Disc levels: Diffuse disc space narrowing and endplate spurring. Multilevel facet hypertrophy.  Upper chest: No acute findings. Other: Advanced carotid calcifications. Prior right thyroidectomy. IMPRESSION: 1. No acute intracranial abnormality. No skull fracture. 2. Age related atrophy, chronic small vessel ischemia and remote right basal ganglia and caudate lacunar infarcts. 3. Multilevel degenerative change in the cervical spine without acute fracture or subluxation. 4. Carotid and skullbase atherosclerosis. Electronically Signed   By: Keith Rake M.D.   On: 10/21/2018 22:50   Ct Abdomen Pelvis W Contrast  Result Date: 10/22/2018 CLINICAL DATA:  Shortness of breath EXAM: CT ANGIOGRAPHY CHEST CT ABDOMEN AND PELVIS WITH CONTRAST TECHNIQUE: Multidetector CT imaging of the chest was performed using the standard protocol during bolus administration of intravenous contrast. Multiplanar CT image reconstructions and MIPs were obtained to evaluate the vascular anatomy. Multidetector CT imaging of the abdomen and pelvis was performed using the standard protocol during bolus administration of intravenous contrast. CONTRAST:  32mL OMNIPAQUE IOHEXOL 350 MG/ML SOLN COMPARISON:  CT abdomen pelvis 12/04/2014 FINDINGS: CTA CHEST FINDINGS Cardiovascular: Contrast injection is sufficient to demonstrate satisfactory  opacification of the pulmonary arteries to the segmental level.There is a mass abutting the origin of the right middle lobar pulmonary artery, causing abrupt occlusion. There is no discrete pulmonary embolus. The left pulmonary artery tree is normal. Mediastinum/Nodes: No mediastinal, hilar or axillary lymphadenopathy. The visualized thyroid and thoracic esophageal course are unremarkable. Lungs/Pleura: Right hilar mass measures 3.3 x 3.5 cm. There are smaller nodules in the right upper lobe (series 6, image 42). There is a 4 mm subpleural nodule in the left lower lobe (image 102). Musculoskeletal: No chest wall abnormality. No acute or significant osseous findings. Review of the MIP images confirms the above findings. CT ABDOMEN and PELVIS FINDINGS Hepatobiliary: Normal hepatic contours and density. No visible biliary dilatation. Normal gallbladder. Pancreas: Normal contours without ductal dilatation. No peripancreatic fluid collection. Spleen: Normal. Adrenals/Urinary Tract: --Adrenal glands: Left adrenal nodule measures 16 mm with an attenuation value of 36 HU. --Right kidney/ureter: No hydronephrosis or perinephric stranding. No nephrolithiasis. No obstructing ureteral stones. --Left kidney/ureter: Status post left nephrectomy --Urinary bladder: Unremarkable. Stomach/Bowel: --Stomach/Duodenum: No hiatal hernia or other gastric abnormality. Normal duodenal course and caliber. --Small bowel: No dilatation or inflammation. --Colon: No focal abnormality. --Appendix: Surgically absent. Vascular/Lymphatic: Atherosclerotic calcification is present within the non-aneurysmal abdominal aorta, without hemodynamically significant stenosis. No abdominal or pelvic lymphadenopathy. Reproductive: No free fluid in the pelvis. Musculoskeletal. No bony spinal canal stenosis or focal osseous abnormality. Other: None. IMPRESSION: 1. Right hilar mass causing occlusion of the right middle lobar pulmonary artery. This is most  consistent with bronchogenic carcinoma. 2. Otherwise patent pulmonary arteries. 3. Tiny nodules of the right lung apex could represent satellite nodules. 4.  Aortic atherosclerosis (ICD10-I70.0). 5. 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 Electronically Signed   By: Ulyses Jarred M.D.   On: 10/22/2018 00:35   US Renal  Result Date: 10/23/2018 CLINICAL DATA:  Acute kidney injury.  LEFT nephrectomy. EXAM: RENAL / URINARY TRACT ULTRASOUND COMPLETE COMPARISON:  None. FINDINGS: Right Kidney: Renal measurements: 11.6 x 5 x 5.7 cm = volume: 151 mL . Echogenicity within normal limits. 1.3 cm cyst within the lower pole. No suspicious mass or hydronephrosis visualized. Left Kidney: Absent. Bladder: Appears normal for degree of bladder distention. Distal RIGHT ureter shown to be patent at the level the bladder (RIGHT ureteral jet visualized). IMPRESSION: 1. Normal RIGHT kidney.  No hydronephrosis. 2. Status post LEFT nephrectomy. Electronically Signed   By: Franki Cabot M.D.   On: 10/23/2018  10:06   Dg Chest Port 1 View  Result Date: 10/23/2018 CLINICAL DATA:  Shortness of breath. EXAM: PORTABLE CHEST 1 VIEW COMPARISON:  10/21/2018 FINDINGS: Lordotic positioning noted. Heart size is stable. Aortic atherosclerosis. Transvenous pacemaker remains in appropriate position. Right middle lobe collapse is again seen. Left lung is clear. No evidence of pleural effusion. IMPRESSION: Stable right middle lobe collapse.  No new findings. Electronically Signed   By: Marlaine Hind M.D.   On: 10/23/2018 04:48   Dg Hip Unilat With Pelvis 2-3 Views Right  Result Date: 10/21/2018 CLINICAL DATA:  Right hip pain following a fall today. EXAM: DG HIP (WITH OR WITHOUT PELVIS) 2-3V RIGHT COMPARISON:  Right femur dated 05/27/2017. FINDINGS: Normal appearing hips without fracture or dislocation. Lower lumbar spine degenerative changes. IMPRESSION: No fracture or dislocation. Electronically Signed   By: Claudie Revering  M.D.   On: 10/21/2018 20:12   Scheduled Meds:  atorvastatin  40 mg Oral q1800   buPROPion  300 mg Oral Daily   enoxaparin (LOVENOX) injection  40 mg Subcutaneous Daily   gabapentin  100 mg Oral TID   insulin aspart  0-5 Units Subcutaneous QHS   insulin aspart  0-9 Units Subcutaneous TID WC   levothyroxine  50 mcg Oral Daily   metoprolol tartrate  25 mg Oral BID   sodium chloride flush  3 mL Intravenous Q12H   sodium chloride flush  3 mL Intravenous Q12H   Continuous Infusions:  sodium chloride 250 mL (10/23/18 0215)   dextrose 5 % and 0.45% NaCl 75 mL/hr at 10/23/18 1039    LOS: 0 days   Kerney Elbe, DO [facility] PAGER is on AMION  If 7PM-7AM, please contact night-coverage [email or web address] Password [facility] 10/23/2018, 3:51 PM

## 2018-10-23 NOTE — Progress Notes (Signed)
Bladder scan showed 269ml of urine.

## 2018-10-23 NOTE — TOC Initial Note (Signed)
Transition of Care [facility]) - Initial/Assessment Note    Patient Details  Name: Carolyn [name] MRN: [id number] Date of Birth: 1941-03-06  Transition of [facility]) CM/SW Contact:    Bartholomew Crews, RN Phone Number: [phone] 10/23/2018, 1:32 PM  Clinical Narrative:                 Spoke with patient's daughter, Rutherford Limerick, to discuss Medicare observation letter d/t patient having confusion. Assessment completed. PTA home alone. Daughter provides transportation to medical appointments. [facility],  States that patient lost her spouse about a year ago after he was diagnosed with cancer and admitted to the cancer ward where he died. States that patient is concerned that her with cancer diagnosis that this will be her outcome as well. Daughter states that she can be with patient at home in order for patient to be able to return home, and that the family is also looking at getting a Life Alert.   CM to follow for transition of care needs.   Expected Discharge Plan: [place] Barriers to Discharge: Continued Medical Work up   Patient Goals and CMS Choice Patient states their goals for this hospitalization and ongoing recovery are:: return to her home CMS Medicare.gov Compare Post Acute Care list provided to:: Patient Choice offered to / list presented to : Patient, Adult Children  Expected Discharge Plan and Services Expected Discharge Plan: [place] In-house Referral: NA Discharge Planning Services: CM Consult Post Acute Care Choice: [place] arrangements for the past 2 months: Single Family Home                                      Prior Living Arrangements/Services Living arrangements for the past 2 months: Single Family Home Lives with:: Self Patient language and need for interpreter reviewed:: Yes Do you feel safe going back to the place where you live?: Yes      Need for Family  Participation in Patient Care: Yes (Comment) Care giver support system in place?: Yes (comment)   Criminal Activity/Legal Involvement Pertinent to Current Situation/Hospitalization: No - Comment as needed  Activities of Daily Living      Permission Sought/Granted Permission sought to share information with : Family Supports Permission granted to share information with : Yes, Verbal Permission Granted  Share Information with NAME: Raynette Hassell Done     Permission granted to share info w Relationship: daughter  Permission granted to share info w Contact Information: [phone]  Emotional Assessment Appearance:: Appears stated age Attitude/Demeanor/Rapport: Engaged Affect (typically observed): Accepting Orientation: : Oriented to Self, Oriented to Place, Oriented to  Time, Oriented to Situation Alcohol / Substance Use: Not Applicable Psych Involvement: Yes (comment)  Admission diagnosis:  Confusion [R41.0] SOB (shortness of breath) [R06.02] Hypoglycemia [E16.2] Muscle spasm [M62.838] Pneumonia of right middle lobe due to infectious organism ([place]) [J18.1] Mass of right lung [R91.8] Patient Active Problem List   Diagnosis Date Noted  . Acute encephalopathy 10/22/2018  . Prolonged QT interval 10/22/2018  . Chronic diastolic CHF (congestive heart failure) ([place]) 10/22/2018  . Hypoglycemia due to insulin 10/22/2018  . Mass of right lung 10/22/2018  . Hypertensive urgency 10/22/2018  . Trapezoid ligament sprain 07/21/2017  . Right lumbar radiculopathy 07/21/2017  . Spondylosis without myelopathy or radiculopathy, lumbar region 03/10/2016  . Ganglion cyst of flexor tendon sheath  of finger of left hand 09/24/2015  . Back pain 09/24/2015  . Cellulitis of toe of right foot 08/15/2013  . ICD-St.Jude 10/26/2011  . Chronic lower back pain 06/01/2011  . Dizziness 07/04/2010  . Allergic rhinitis 07/04/2010  . Preventative health care 05/30/2010  . Hormone replacement therapy  (postmenopausal) 05/30/2010  . Essential hypertension, benign 02/05/2010  . JOINT EFFUSION, RIGHT KNEE 12/03/2009  . FLANK PAIN, RIGHT 06/03/2009  . INSOMNIA-SLEEP DISORDER-UNSPEC 02/12/2009  . LUMBAR RADICULOPATHY, LEFT 08/28/2008  . Hypothyroidism 07/04/2008  . CARDIOMYOPATHY 03/20/2008  . Chronic systolic dysfunction of left ventricle 12/20/2007  . ECHOCARDIOGRAM, ABNORMAL 12/20/2007  . STRESS ELECTROCARDIOGRAM, ABNORMAL 12/20/2007  . Insulin dependent diabetes mellitus ([place]) 12/13/2007  . Left bundle branch block 12/12/2007  . SKIN LESIONS, MULTIPLE 08/30/2007  . FATIGUE 08/30/2007  . Tobacco use disorder 06/13/2007  . Depression 12/20/2006  . GERD 12/20/2006  . BACK PAIN, LUMBAR, CHRONIC 12/20/2006  . NEPHROLITHIASIS, HX OF 12/20/2006  . THYROID NODULE 10/24/2006  . Hyperlipidemia 10/24/2006  . Anxiety state 10/24/2006  . Irritable bowel syndrome 10/24/2006  . UTI'S, RECURRENT 10/24/2006  . COLONIC POLYPS, ADENOMATOUS, HX OF 10/24/2006   PCP:  Biagio Borg, MD Pharmacy:   [id number] [place], [place] [address] Phone: [phone] Fax: [phone]     Social Determinants of Health (SDOH) Interventions    Readmission Risk Interventions No flowsheet data found.
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
## 2018-10-23 NOTE — Evaluation (Signed)
Occupational Therapy Evaluation Patient Details Name: Carolyn [name] MRN: [id number] DOB: Sep 07, 1940 Today's Date: 10/23/2018    History of Present Illness 78 yo female smoker with PMHx: DM, diastolic CHF, Hypothyroidism, back pain, anxiety, depression, nephrolithiasis, IBS, insomnia, HLD, Colon polyps, GERD, HTN, s/p AICD, non ischemic CM, found down after fall at home.  Unable to give consistent account of fall and has had visual hallucinations.  CT chest showed lung mass.    Clinical Impression   PTA, pt reports independence with ADL and functional mobility and living alone. She does have some family support from her children. She demonstrates poor awareness of safety, intellectual awareness, and is very easily distracted. She requires overall minimum assistance for functional toilet transfers and LB ADL. She requires cues for initiation and continuation of each ADL activity. Pt would benefit from continued OT services while admitted to improve independence and safety with ADL and functional mobility. Recommend SNF level rehabilitation unless family is able to provide 24/7 assistance to maximize safety. OT will continue to follow while admitted.     Follow Up Recommendations  Supervision/Assistance - 24 hour;SNF    Equipment Recommendations  None recommended by OT    Recommendations for Other Services       Precautions / Restrictions Precautions Precautions: Fall Restrictions Weight Bearing Restrictions: No      Mobility Bed Mobility Overal bed mobility: Needs Assistance Bed Mobility: Supine to Sit     Supine to sit: Min guard;HOB elevated     General bed mobility comments: follows instruction to sit EOB, uses rail, supervision for safety wtih occasional hands on   Transfers Overall transfer level: Needs assistance Equipment used: Rolling walker (2 wheeled) Transfers: Sit to/from Stand Sit to Stand: Min assist         General transfer comment: needs assist for lift  off from low surface (simulates home surface) using RW, bed rail, or window sill from bed/toilet.  Steady in standing, denies leg weakness    Balance Overall balance assessment: Needs assistance;History of Falls Sitting-balance support: No upper extremity supported;Feet supported Sitting balance-Leahy Scale: Fair Sitting balance - Comments: cannot bend forward to don/doff socks   Standing balance support: During functional activity;Bilateral upper extremity supported Standing balance-Leahy Scale: Fair Standing balance comment: appears steady in static but dependent on RW especially for dynamic activities                           ADL either performed or assessed with clinical judgement   ADL Overall ADL's : Needs assistance/impaired Eating/Feeding: Set up;Sitting   Grooming: Min guard;Standing Grooming Details (indicate cue type and reason): cues to progress and continue with tasks as pt so distracted by the stories she was telling OT.  Upper Body Bathing: Sitting;Supervision/ safety   Lower Body Bathing: Sit to/from stand;Minimal assistance   Upper Body Dressing : Supervision/safety;Sitting   Lower Body Dressing: Minimal assistance;Sit to/from stand   Toilet Transfer: Minimal assistance;Ambulation;RW;Regular Toilet;Grab bars Toilet Transfer Details (indicate cue type and reason): cues for safe hand placement Toileting- Clothing Manipulation and Hygiene: Supervision/safety;Sitting/lateral lean       Functional mobility during ADLs: Minimal assistance;Rolling walker General ADL Comments: Pt demonstrating poor stability on her feet and poor awareness of this deficit.      Vision Baseline Vision/History: Wears glasses Wears Glasses: Reading only(does not have these present) Patient Visual Report: No change from baseline Vision Assessment?: No apparent visual deficits     Perception  Praxis      Pertinent Vitals/Pain Pain Assessment: No/denies pain      Hand Dominance     Extremity/Trunk Assessment Upper Extremity Assessment Upper Extremity Assessment: Generalized weakness(proximally more impaired than distally)   Lower Extremity Assessment Lower Extremity Assessment: Defer to PT evaluation       Communication Communication Communication: No difficulties(pt talkative and tangential)   Cognition Arousal/Alertness: Awake/alert Behavior During Therapy: WFL for tasks assessed/performed(distractable) Overall Cognitive Status: Impaired/Different from baseline Area of Impairment: Memory;Safety/judgement;Awareness                     Memory: Decreased short-term memory   Safety/Judgement: Decreased awareness of safety;Decreased awareness of deficits Awareness: Intellectual(less than... insists she can get up alone despite education)   General Comments: Pt tangential and very distracted during session. Provided maximum education concerning need for assistance to get up andmove about the room. However, pt initially agreeing and then stating that she is safe to get up without assistance. CNA alerted concerning pt safety risk. No chair alarm available.    General Comments  Pt remaining confused and having difficulty recalling events of the day/recent events in the correct sequence.     Exercises     Shoulder Instructions      Home Living Family/patient expects to be discharged to:: Private residence Living Arrangements: Alone Available Help at Discharge: Family;Available PRN/intermittently(need to clarify assist at d/c) Type of Home: House Home Access: Stairs to enter [employer] of Steps: 5 Entrance Stairs-Rails: Can reach both Home Layout: One level     Bathroom Shower/Tub: Tub/shower unit         Home Equipment: Environmental consultant - 2 wheels(unclear if RW is patient's or borrowed)   Additional Comments: pt mentions sons but asserts lives alone      Prior Functioning/Environment Level of Independence:  Independent        Comments: generally describes independent function, denies use of device regularly; states RLE weaker when ascending stairs (see Stairs below0        OT Problem List: Decreased strength;Decreased range of motion;Decreased activity tolerance;Impaired balance (sitting and/or standing);Decreased safety awareness;Decreased knowledge of use of DME or AE;Decreased knowledge of precautions      OT Treatment/Interventions: Self-care/ADL training;Therapeutic exercise;Energy conservation;DME and/or AE instruction;Therapeutic activities;Patient/family education;Balance training    OT Goals(Current goals can be found in the care plan section) Acute Rehab OT Goals Patient Stated Goal: home OT Goal Formulation: With patient Time For Goal Achievement: 11/06/18 Potential to Achieve Goals: Good ADL Goals Pt Will Perform Grooming: with modified independence;standing Pt Will Perform Lower Body Dressing: with modified independence;sit to/from stand Pt Will Transfer to Toilet: with modified independence;ambulating;regular height toilet;grab bars(using RW) Pt Will Perform Toileting - Clothing Manipulation and hygiene: with modified independence;sit to/from stand Pt Will Perform Tub/Shower Transfer: with supervision;ambulating;with caregiver independent in assisting;rolling walker;shower seat;Tub transfer Additional ADL Goal #1: Pt will demonstrate emerging awareness during morning ADL routine.  OT Frequency: Min 2X/week   Barriers to D/C:            Co-evaluation PT/OT/SLP Co-Evaluation/Treatment: Yes Reason for Co-Treatment: Necessary to address cognition/behavior during functional activity PT goals addressed during session: Mobility/safety with mobility;Proper use of DME OT goals addressed during session: Strengthening/ROM;Proper use of Adaptive equipment and DME;ADL's and self-care      AM-PAC OT "6 Clicks" Daily Activity     Outcome Measure Help from another person eating  meals?: None Help from another person taking care of personal grooming?: A Little Help  from another person toileting, which includes using toliet, bedpan, or urinal?: A Little Help from another person bathing (including washing, rinsing, drying)?: A Little Help from another person to put on and taking off regular upper body clothing?: A Little Help from another person to put on and taking off regular lower body clothing?: A Little 6 Click Score: 19   End of Session Equipment Utilized During Treatment: Gait belt;Rolling walker Nurse Communication: Mobility status;Other (comment)(CNA notified that pt up in chair without alarm)  Activity Tolerance: Patient tolerated treatment well Patient left: in chair;with call bell/phone within reach(no chair alarm available)  OT Visit Diagnosis: Other abnormalities of gait and mobility (R26.89);Muscle weakness (generalized) (M62.81);Other symptoms and signs involving cognitive function                Time: [id number] OT Time Calculation (min): 35 min Charges:  OT General Charges $OT Visit: 1 Visit OT Evaluation $OT Eval Moderate Complexity: [place] A Coleman Kalas 10/23/2018, 2:42 PM

## 2018-10-23 NOTE — Plan of Care (Signed)
  Problem: Education: Goal: Knowledge of General Education information will improve Description: Including pain rating scale, medication(s)/side effects and non-pharmacologic comfort measures Outcome: Progressing   Problem: Clinical Measurements: Goal: Ability to maintain clinical measurements within normal limits will improve Outcome: Progressing   

## 2018-10-23 NOTE — Progress Notes (Signed)
  Echocardiogram 2D Echocardiogram has been performed.  Carolyn [name] 10/23/2018, 11:49 AM

## 2018-10-23 NOTE — Care Management Obs Status (Signed)
[facility] NOTIFICATION   Patient Details  Name: Carolyn [name] MRN: [id number] Date of Birth: March 23, 1940   Medicare Observation Status Notification Given:  Yes    Bartholomew Crews, RN 10/23/2018, 1:29 PM

## 2018-10-24 ENCOUNTER — Telehealth: Payer: Self-pay

## 2018-10-24 ENCOUNTER — Telehealth: Payer: Self-pay | Admitting: *Deleted

## 2018-10-24 ENCOUNTER — Telehealth: Payer: Self-pay | Admitting: Cardiovascular Disease

## 2018-10-24 DIAGNOSIS — R410 Disorientation, unspecified: Secondary | ICD-10-CM

## 2018-10-24 DIAGNOSIS — R441 Visual hallucinations: Secondary | ICD-10-CM

## 2018-10-24 DIAGNOSIS — F4322 Adjustment disorder with anxiety: Secondary | ICD-10-CM

## 2018-10-24 LAB — CBC WITH DIFFERENTIAL/PLATELET
Abs Immature Granulocytes: 0.03 10*3/uL (ref 0.00–0.07)
Basophils Absolute: 0.1 10*3/uL (ref 0.0–0.1)
Basophils Relative: 1 %
Eosinophils Absolute: 0.3 10*3/uL (ref 0.0–0.5)
Eosinophils Relative: 3 %
HCT: 34.7 % — ABNORMAL LOW (ref 36.0–46.0)
Hemoglobin: 11 g/dL — ABNORMAL LOW (ref 12.0–15.0)
Immature Granulocytes: 0 %
Lymphocytes Relative: 34 %
Lymphs Abs: 3.1 10*3/uL (ref 0.7–4.0)
MCH: 29.3 pg (ref 26.0–34.0)
MCHC: 31.7 g/dL (ref 30.0–36.0)
MCV: 92.3 fL (ref 80.0–100.0)
Monocytes Absolute: 1 10*3/uL (ref 0.1–1.0)
Monocytes Relative: 11 %
Neutro Abs: 4.6 10*3/uL (ref 1.7–7.7)
Neutrophils Relative %: 51 %
Platelets: 261 10*3/uL (ref 150–400)
RBC: 3.76 MIL/uL — ABNORMAL LOW (ref 3.87–5.11)
RDW: 14.9 % (ref 11.5–15.5)
WBC: 9 10*3/uL (ref 4.0–10.5)
nRBC: 0 % (ref 0.0–0.2)

## 2018-10-24 LAB — COMPREHENSIVE METABOLIC PANEL
ALT: 16 U/L (ref 0–44)
AST: 18 U/L (ref 15–41)
Albumin: 3 g/dL — ABNORMAL LOW (ref 3.5–5.0)
Alkaline Phosphatase: 40 U/L (ref 38–126)
Anion gap: 8 (ref 5–15)
BUN: 10 mg/dL (ref 8–23)
CO2: 23 mmol/L (ref 22–32)
Calcium: 8.3 mg/dL — ABNORMAL LOW (ref 8.9–10.3)
Chloride: 111 mmol/L (ref 98–111)
Creatinine, Ser: 0.92 mg/dL (ref 0.44–1.00)
GFR calc Af Amer: >60 mL/min (ref >60)
GFR calc non Af Amer: >60 mL/min (ref >60)
Glucose, Bld: 130 mg/dL — ABNORMAL HIGH (ref 70–99)
Potassium: 4 mmol/L (ref 3.5–5.1)
Sodium: 142 mmol/L (ref 135–145)
Total Bilirubin: 0.6 mg/dL (ref 0.3–1.2)
Total Protein: 5.8 g/dL — ABNORMAL LOW (ref 6.5–8.1)

## 2018-10-24 LAB — RETICULOCYTES
Immature Retic Fract: 13.5 % (ref 2.3–15.9)
RBC.: 3.76 MIL/uL — ABNORMAL LOW (ref 3.87–5.11)
Retic Count, Absolute: 56.8 10*3/uL (ref 19.0–186.0)
Retic Ct Pct: 1.5 % (ref 0.4–3.1)

## 2018-10-24 LAB — GLUCOSE, CAPILLARY
Glucose-Capillary: 143 mg/dL — ABNORMAL HIGH (ref 70–99)
Glucose-Capillary: 122 mg/dL — ABNORMAL HIGH (ref 70–99)
Glucose-Capillary: 114 mg/dL — ABNORMAL HIGH (ref 70–99)
Glucose-Capillary: 133 mg/dL — ABNORMAL HIGH (ref 70–99)

## 2018-10-24 LAB — FOLATE RBC
Folate, Hemolysate: 317 ng/mL
Folate, RBC: 903 ng/mL (ref >498)
Hematocrit: 35.1 % (ref 34.0–46.6)

## 2018-10-24 LAB — IRON AND TIBC
Iron: 55 ug/dL (ref 28–170)
Saturation Ratios: 20 % (ref 10.4–31.8)
TIBC: 280 ug/dL (ref 250–450)
UIBC: 225 ug/dL

## 2018-10-24 LAB — SODIUM, URINE, RANDOM: Sodium, Ur: 145 mmol/L

## 2018-10-24 LAB — OSMOLALITY, URINE: Osmolality, Ur: 464 mOsm/kg (ref 300–900)

## 2018-10-24 LAB — VITAMIN B12: Vitamin B-12: 323 pg/mL (ref 180–914)

## 2018-10-24 LAB — FOLATE: Folate: 10.7 ng/mL (ref >5.9)

## 2018-10-24 LAB — CREATININE, URINE, RANDOM: Creatinine, Urine: 66.47 mg/dL

## 2018-10-24 LAB — PHOSPHORUS: Phosphorus: 3.2 mg/dL (ref 2.5–4.6)

## 2018-10-24 LAB — MAGNESIUM: Magnesium: 1.8 mg/dL (ref 1.7–2.4)

## 2018-10-24 LAB — FERRITIN: Ferritin: 68 ng/mL (ref 11–307)

## 2018-10-24 LAB — HIV ANTIBODY (ROUTINE TESTING W REFLEX): HIV Screen 4th Generation wRfx: NONREACTIVE

## 2018-10-24 MED ORDER — HYDRALAZINE HCL 20 MG/ML IJ SOLN: 10.0000 mg | Freq: Four times a day (QID) | INTRAMUSCULAR | Status: DC | PRN

## 2018-10-24 MED ORDER — GABAPENTIN 100 MG PO CAPS
200.0000 mg | ORAL_CAPSULE | Freq: Three times a day (TID) | ORAL | Status: DC
  Administered 2018-10-24 – 2018-11-01 (×24): 200 mg via ORAL
  Filled 2018-10-24 (×24): qty 2

## 2018-10-24 MED ORDER — TRAZODONE HCL 50 MG PO TABS
50.0000 mg | ORAL_TABLET | Freq: Every evening | ORAL | Status: DC | PRN
  Administered 2018-10-24 – 2018-10-31 (×7): 50 mg via ORAL
  Filled 2018-10-24 (×7): qty 1

## 2018-10-24 MED ORDER — MAGNESIUM SULFATE 2 GM/50ML IV SOLN
2.0000 g | Freq: Once | INTRAVENOUS | Status: AC
  Administered 2018-10-24: 2 g via INTRAVENOUS
  Filled 2018-10-24: qty 50

## 2018-10-24 NOTE — Telephone Encounter (Signed)
Called patient and LVM after her hospital discharge. Nurse did state that she would call patient back later to see if she had any questions or concerns after her discharge and did inform her that her discharge instructions state for her to make a hospital follow-up appointment with Dr. Jenny Reichmann.

## 2018-10-24 NOTE — Progress Notes (Addendum)
NAME:  Carolyn [name], MRN:  [id number], DOB:  1941/01/08, LOS: 1 ADMISSION DATE:  10/21/2018, CONSULTATION DATE:  10/23/2018 REFERRING MD:  Dr. Alfredia Ferguson, [facility], CHIEF COMPLAINT:  Lung mass   Brief History   78 yo female smoker fell at home and unable to get up.  CT chest showed lung mass.  History of present illness   78 yo female fell at home.  Unable to get up.  Had low blood sugar.  C/o flank pain.  Confused in ER.  She had CT imaging in ER.  CT chest showed Rt perihilar mass.    She denies cough, fever, chills, sweats, weight loss, gland swelling, voice change, vision, change, or hemoptysis.  She still smokes 1/2 PPD.  Past Medical History  DM, diastolic CHF, Hypothyroidism, back pain, anxiety, depression, nephrolithiasis, IBS, insomnia, HLD, Colon polyps, GERD, HTN, s/p AICD, non ischemic CM  Significant Hospital Events   8/15 Admit  Consults:    Procedures:    Significant Diagnostic Tests:  CT chest 8/14 >> 3.5 cm Rt hilar mass, 4 mm LLL nodule Echo 8/16 >> EF 50 to 55%  Micro Data:  COVID 8/14 >> negative  Antimicrobials:    Interim history/subjective:  Feels more energy.  Still has dry cough.  Denies chest pain.  Objective   Blood pressure (!) 185/97, pulse 64, temperature 98.4 F (36.9 C), temperature source Oral, resp. rate 20, height 5' (1.524 m), weight 74.1 kg, SpO2 96 %.        Intake/Output Summary (Last 24 hours) at 10/24/2018 1332 Last data filed at 10/24/2018 0900 Gross per 24 hour  Intake 1686.79 ml  Output 1750 ml  Net -63.21 ml   Filed Weights   10/21/18 2302 10/22/18 1615  Weight: 73.9 kg 74.1 kg    Examination:  General - alert Eyes - pupils reactive ENT - no sinus tenderness, no stridor Cardiac - regular rate/rhythm, no murmur Chest - equal breath sounds b/l, no wheezing or rales Abdomen - soft, non tender, + bowel sounds Extremities - no cyanosis, clubbing, or edema Skin - several areas of bruising on her arms Neuro - normal  strength, moves extremities, follows commands Psych - normal mood and behavior   Resolved Hospital Problem list     Assessment & Plan:   78 yo female smoker with incidental finding of Rt perihilar mass. - reviewed procedure of bronchoscopy - risks detailed as bleeding, infection, pneumothorax, and non diagnosis; she is will to proceed  - will try to arrange for 8/18  Best practice:  Diet: carb modified, heart healthy DVT prophylaxis: lovenox GI prophylaxis: not indicated Mobility: oob Code Status: full code Disposition: tele  Labs    CMP Latest Ref Rng & Units 10/24/2018 10/23/2018 10/22/2018  Glucose 70 - 99 mg/dL 130(H) 137(H) 63(L)  BUN 8 - 23 mg/dL 10 18 13   Creatinine 0.44 - 1.00 mg/dL 0.92 1.41(H) 0.94  Sodium 135 - 145 mmol/L 142 134(L) 134(L)  Potassium 3.5 - 5.1 mmol/L 4.0 4.0 3.5  Chloride 98 - 111 mmol/L 111 99 100  CO2 22 - 32 mmol/L 23 25 22   Calcium 8.9 - 10.3 mg/dL 8.3(L) 8.5(L) 9.4  Total Protein 6.5 - 8.1 g/dL 5.8(L) 6.1(L) -  Total Bilirubin 0.3 - 1.2 mg/dL 0.6 0.6 -  Alkaline Phos 38 - 126 U/L 40 40 -  AST 15 - 41 U/L 18 26 -  ALT 0 - 44 U/L 16 19 -    CBC Latest Ref Rng &  Units 10/24/2018 10/23/2018 10/22/2018  WBC 4.0 - 10.5 K/uL 9.0 10.7(H) 14.6(H)  Hemoglobin 12.0 - 15.0 g/dL 11.0(L) 10.9(L) 11.8(L)  Hematocrit 36.0 - 46.0 % 34.7(L) 33.6(L) 36.0  Platelets 150 - 400 K/uL 261 249 270    CBG (last 3)  Recent Labs    10/23/18 2043 10/24/18 0652 10/24/18 [place], MD [place] 10/24/2018, 1:39 PM  Bronchoscopy scheduled for 8 am on 10/25/18.  Chesley Mires, MD [facility] Pulmonary/Critical Care 10/24/2018, 1:41 PM

## 2018-10-24 NOTE — Telephone Encounter (Signed)
New message:    Patient grand daughter calling stating her grandmother is in the hospital at [facility]  and is unable to send a transmion for her device.

## 2018-10-24 NOTE — Progress Notes (Signed)
R midline site assessment: line beeping occluded. Dressing changed, adjusted securement device to reposition midline medially to avoid bend of arm. Site unremarkable.

## 2018-10-24 NOTE — Telephone Encounter (Signed)
FYI  Copied from [place] [id number]. Topic: General - Other >> Oct 24, 2018 11:06 AM Celene Kras A wrote: Reason for CRM: Pts granddaughter called stating pt is in the hospital. She states the pt has been diagnosed with cancer and pneumonia. Please advise.
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
## 2018-10-24 NOTE — Plan of Care (Signed)
  Problem: Activity: Goal: Risk for activity intolerance will decrease Outcome: Progressing   

## 2018-10-25 ENCOUNTER — Inpatient Hospital Stay (HOSPITAL_COMMUNITY): Payer: Medicare HMO

## 2018-10-25 ENCOUNTER — Encounter: Payer: Self-pay | Admitting: Cardiology

## 2018-10-25 ENCOUNTER — Encounter (HOSPITAL_COMMUNITY)
Admission: EM | Disposition: A | Discharge status: Home Health | Payer: Self-pay | Source: Home / Self Care | Attending: Internal Medicine

## 2018-10-25 ENCOUNTER — Encounter (HOSPITAL_COMMUNITY): Payer: Self-pay | Admitting: Pulmonary Disease

## 2018-10-25 HISTORY — PX: VIDEO BRONCHOSCOPY: SHX5072

## 2018-10-25 LAB — COMPREHENSIVE METABOLIC PANEL
ALT: 19 U/L (ref 0–44)
AST: 19 U/L (ref 15–41)
Albumin: 3.6 g/dL (ref 3.5–5.0)
Alkaline Phosphatase: 43 U/L (ref 38–126)
Anion gap: 9 (ref 5–15)
BUN: 9 mg/dL (ref 8–23)
CO2: 24 mmol/L (ref 22–32)
Calcium: 9.3 mg/dL (ref 8.9–10.3)
Chloride: 107 mmol/L (ref 98–111)
Creatinine, Ser: 0.84 mg/dL (ref 0.44–1.00)
GFR calc Af Amer: >60 mL/min (ref >60)
GFR calc non Af Amer: >60 mL/min (ref >60)
Glucose, Bld: 147 mg/dL — ABNORMAL HIGH (ref 70–99)
Potassium: 3.9 mmol/L (ref 3.5–5.1)
Sodium: 140 mmol/L (ref 135–145)
Total Bilirubin: 0.8 mg/dL (ref 0.3–1.2)
Total Protein: 6.8 g/dL (ref 6.5–8.1)

## 2018-10-25 LAB — CBC WITH DIFFERENTIAL/PLATELET
Abs Immature Granulocytes: 0.07 10*3/uL (ref 0.00–0.07)
Basophils Absolute: 0.1 10*3/uL (ref 0.0–0.1)
Basophils Relative: 1 %
Eosinophils Absolute: 0.3 10*3/uL (ref 0.0–0.5)
Eosinophils Relative: 4 %
HCT: 36.5 % (ref 36.0–46.0)
Hemoglobin: 11.8 g/dL — ABNORMAL LOW (ref 12.0–15.0)
Immature Granulocytes: 1 %
Lymphocytes Relative: 37 %
Lymphs Abs: 3.5 10*3/uL (ref 0.7–4.0)
MCH: 29.3 pg (ref 26.0–34.0)
MCHC: 32.3 g/dL (ref 30.0–36.0)
MCV: 90.6 fL (ref 80.0–100.0)
Monocytes Absolute: 0.9 10*3/uL (ref 0.1–1.0)
Monocytes Relative: 10 %
Neutro Abs: 4.4 10*3/uL (ref 1.7–7.7)
Neutrophils Relative %: 47 %
Platelets: 277 10*3/uL (ref 150–400)
RBC: 4.03 MIL/uL (ref 3.87–5.11)
RDW: 14.6 % (ref 11.5–15.5)
WBC: 9.4 10*3/uL (ref 4.0–10.5)
nRBC: 0 % (ref 0.0–0.2)

## 2018-10-25 LAB — GLUCOSE, CAPILLARY
Glucose-Capillary: 143 mg/dL — ABNORMAL HIGH (ref 70–99)
Glucose-Capillary: 225 mg/dL — ABNORMAL HIGH (ref 70–99)
Glucose-Capillary: 97 mg/dL (ref 70–99)
Glucose-Capillary: 169 mg/dL — ABNORMAL HIGH (ref 70–99)

## 2018-10-25 LAB — MAGNESIUM: Magnesium: 2.3 mg/dL (ref 1.7–2.4)

## 2018-10-25 LAB — PHOSPHORUS: Phosphorus: 3.3 mg/dL (ref 2.5–4.6)

## 2018-10-25 SURGERY — BRONCHOSCOPY, WITH FLUOROSCOPY
Anesthesia: Moderate Sedation | Laterality: Bilateral

## 2018-10-25 MED ORDER — LIDOCAINE HCL 1 % IJ SOLN
INTRAMUSCULAR | Status: DC | PRN
  Administered 2018-10-25: 6 mL via RESPIRATORY_TRACT

## 2018-10-25 MED ORDER — PHENYLEPHRINE HCL 0.25 % NA SOLN
1.0000 | Freq: Four times a day (QID) | NASAL | Status: DC | PRN
  Filled 2018-10-25: qty 15

## 2018-10-25 MED ORDER — FENTANYL CITRATE (PF) 100 MCG/2ML IJ SOLN
INTRAMUSCULAR | Status: AC
  Filled 2018-10-25: qty 4

## 2018-10-25 MED ORDER — LIDOCAINE HCL URETHRAL/MUCOSAL 2 % EX GEL
1.0000 "application " | Freq: Once | CUTANEOUS | Status: DC
  Filled 2018-10-25: qty 20

## 2018-10-25 MED ORDER — SODIUM CHLORIDE 0.9 % IV SOLN
INTRAVENOUS | Status: DC
  Administered 2018-10-25: 08:00:00 via INTRAVENOUS

## 2018-10-25 MED ORDER — BUTAMBEN-TETRACAINE-BENZOCAINE 2-2-14 % EX AERO
1.0000 | INHALATION_SPRAY | Freq: Once | CUTANEOUS | Status: DC
  Filled 2018-10-25: qty 20

## 2018-10-25 MED ORDER — FENTANYL CITRATE (PF) 100 MCG/2ML IJ SOLN
INTRAMUSCULAR | Status: DC | PRN
  Administered 2018-10-25 (×2): 50 ug via INTRAVENOUS

## 2018-10-25 MED ORDER — MIDAZOLAM HCL (PF) 10 MG/2ML IJ SOLN
INTRAMUSCULAR | Status: DC | PRN
  Administered 2018-10-25: 1 mg via INTRAVENOUS
  Administered 2018-10-25 (×2): 2 mg via INTRAVENOUS

## 2018-10-25 MED ORDER — MIDAZOLAM HCL (PF) 5 MG/ML IJ SOLN
INTRAMUSCULAR | Status: AC
  Filled 2018-10-25: qty 2

## 2018-10-25 NOTE — Progress Notes (Signed)
Physical Therapy Treatment Patient Details Name: Carolyn [name] MRN: [id number] DOB: 04-29-1940 Today's Date: 10/25/2018    History of Present Illness 78 yo female smoker with PMHx: DM, diastolic CHF, Hypothyroidism, back pain, anxiety, depression, nephrolithiasis, IBS, insomnia, HLD, Colon polyps, GERD, HTN, s/p AICD, non ischemic CM, found down after fall at home.  Unable to give consistent account of fall and has had visual hallucinations.  CT chest showed lung mass.     PT Comments    Continuing work on functional mobility and activity tolerance;  Noteworthy improvements in balance and activity tolerance compare to last session; If we can confirm 24 hour reliable assist -- we can consider going home from a functional mobility standpoint;   Will consider walking with RW versus cane versus no device to discern best ambulation device.  Follow Up Recommendations  SNF;Home health PT;Other (comment)(can we confirm 24 hour assist at home?)     Equipment Recommendations  Rolling walker with 5" wheels(vs cane versus no assistive device)    Recommendations for Other Services       Precautions / Restrictions Precautions Precautions: Fall    Mobility  Bed Mobility               General bed mobility comments: sitting EOB upon my arrival  Transfers Overall transfer level: Needs assistance Equipment used: Rolling walker (2 wheeled) Transfers: Sit to/from Stand Sit to Stand: Min guard(no physical contact)         General transfer comment: Cues for hand placement and safety  Ambulation/Gait Ambulation/Gait assistance: Min guard Gait Distance (Feet): 100 Feet Assistive device: Rolling walker (2 wheeled) Gait Pattern/deviations: Step-through pattern     General Gait Details: Walked in hallways with RW; Carolyn [name] occasionally picks up the RW impulsivley, in order to show that she doesn't need the RW; Very talkative throughout walk; no gross loss of balance overall   Stairs             Wheelchair Mobility    Modified Rankin (Stroke Patients Only)       Balance     Sitting balance-Leahy Scale: Fair       Standing balance-Leahy Scale: Fair                              Cognition Arousal/Alertness: Awake/alert Behavior During Therapy: WFL for tasks assessed/performed(distractable) Overall Cognitive Status: Impaired/Different from baseline Area of Impairment: Memory;Safety/judgement;Awareness                     Memory: Decreased short-term memory   Safety/Judgement: Decreased awareness of safety;Decreased awareness of deficits Awareness: Intellectual(less than... insists she can get up alone despite education)          Exercises      General Comments        Pertinent Vitals/Pain Pain Assessment: No/denies pain    Home Living                      Prior Function            PT Goals (current goals can now be found in the care plan section) Acute Rehab PT Goals Patient Stated Goal: home PT Goal Formulation: With patient Time For Goal Achievement: 11/06/18 Potential to Achieve Goals: Good Progress towards PT goals: Progressing toward goals    Frequency    Min 3X/week      PT Plan Current plan remains appropriate  Co-evaluation              AM-PAC PT "6 Clicks" Mobility   Outcome Measure  Help needed turning from your back to your side while in a flat bed without using bedrails?: None Help needed moving from lying on your back to sitting on the side of a flat bed without using bedrails?: A Little Help needed moving to and from a bed to a chair (including a wheelchair)?: A Little Help needed standing up from a chair using your arms (e.g., wheelchair or bedside chair)?: A Little Help needed to walk in hospital room?: A Little Help needed climbing 3-5 steps with a railing? : A Little 6 Click Score: 19    End of Session Equipment Utilized During Treatment: Gait belt Activity  Tolerance: Patient tolerated treatment well Patient left: in chair;with call bell/phone within reach;with chair alarm set Nurse Communication: Mobility status PT Visit Diagnosis: Difficulty in walking, not elsewhere classified (R26.2);Muscle weakness (generalized) (M62.81);History of falling (Z91.81)     Time: 1898-4210 PT Time Calculation (min) (ACUTE ONLY): 30 min  Charges:  $Gait Training: 8-22 mins $Therapeutic Activity: 8-22 mins                     Roney Marion, PT  Acute Rehabilitation Services Pager [phone] Office [place] 10/25/2018, 3:37 PM

## 2018-10-25 NOTE — Progress Notes (Signed)
Patient is refusing bed and chair alarm.

## 2018-10-25 NOTE — Progress Notes (Signed)
Remote ICD transmission.   

## 2018-10-25 NOTE — Progress Notes (Signed)
PT Cancellation Note  Patient Details Name: Carolyn [name] MRN: [id number] DOB: Jul 01, 1940   Cancelled Treatment:    Reason Eval/Treat Not Completed: Patient at procedure or test/unavailable   Currently at Endo;   Will follow up later today as time allows;  Otherwise, will follow up for PT tomorrow;   Thank you,  Roney Marion, PT  Acute Rehabilitation Services Pager [phone] Office [phone]     [place] 10/25/2018, 8:26 AM

## 2018-10-25 NOTE — Plan of Care (Signed)
  Problem: Activity: Goal: Risk for activity intolerance will decrease Outcome: Progressing   

## 2018-10-25 NOTE — Op Note (Signed)
[facility] Cardiopulmonary Patient Name: Carolyn [name] Pocedure Date: 10/25/2018 MRN: [id number] Attending MD: Chesley Mires , MD Date of Birth: 1940/11/18 CSN: Finalized Age: 78 Admit Type: Inpatient Gender: Female Procedure:            Bronchoscopy Indications:          Right middle lobe mass Providers:            Chesley Mires, MD, Andre Lefort RRT,RCP, Christin Fudge, Ciro Backer RRT, RCP Referring MD:          Medicines:            Fentanyl mcg IV, Midazolam 6 mg IV, Lidocaine 2%                        applied to cords 8 mL Complications:        No immediate complications Estimated Blood Loss: Estimated blood loss was minimal. Procedure:            Pre-Anesthesia Assessment:                       - A History and Physical has been performed. The                        patient's medications, allergies and sensitivities have                        been reviewed.                       - The risks and benefits of the procedure and the                        sedation options and risks were discussed with the                        patient. All questions were answered and informed                        consent was obtained.                       After obtaining informed consent, the bronchoscope was                        passed under direct vision. Throughout the procedure,                        the patient's blood pressure, pulse, and oxygen                        saturations were monitored continuously. the [id number]                        ([id number]) Olympus Therapeutic Bronchoscope was                        introduced through the mouth and advanced to the  tracheobronchial tree of both lungs. The procedure was                        accomplished without difficulty. The patient tolerated                        the procedure well. The total duration of the procedure                        was 16 minutes. Scope In:  8:21:33 AM Scope Out: 8:30:03 AM Findings:      The nasopharynx/oropharynx appears normal. The larynx appears normal.       The vocal cords appear normal. The subglottic space is normal. The       trachea is of normal caliber. The carina is sharp. The tracheobronchial       tree of the left lung was examined to at least the first subsegmental       level. Bronchial mucosa and anatomy in the left lung are normal; there       are no endobronchial lesions, and no secretions.      Right Lung Abnormalities: Extrinsic compression was found in the right       middle lobe. The airway lumen is about 50% occluded.      Transbronchial biopsies of a mass were performed in the lateral segment       of the right middle lobe using alligator forceps. The procedure was       guided by fluoroscopy. Transbronchial biopsy technique was selected       because the sampling site was not visible endoscopically. Five biopsy       passes were performed. Five biopsy samples were obtained.      Estimated blood loss: minimal. Impression:           - Right middle lobe mass                       - The airway examination of the left lung was normal.                       - Extrinsic compression was found in the right middle                        lobe likely secondary to a mass.                       - Transbronchial lung biopsies were performed. Moderate Sedation:      Moderate (conscious) sedation was personally administered by the       pulmonologist. The following parameters were monitored: oxygen       saturation, heart rate, blood pressure, and response to care. Total       physician intraservice time was 15 minutes. Recommendation:       - Await biopsy results. Procedure Code(s):    --- Professional ---                       [phone], Bronchoscopy, rigid or flexible, including                        fluoroscopic guidance, when performed; with  transbronchial lung biopsy(s), single lobe                        [id number], Moderate sedation services provided by the same                        physician or other qualified health care professional                        performing the diagnostic or therapeutic service that                        the sedation supports, requiring the presence of an                        independent trained observer to assist in the                        monitoring of the patient's level of consciousness and                        physiological status; initial 15 minutes of                        intraservice time, patient age 77 years or older Diagnosis Code(s):    --- Professional ---                       R91.8, Other nonspecific abnormal finding of lung field                       J98.4, Other disorders of lung CPT copyright 2019 [employer]. All rights reserved. The codes documented in this report are preliminary and upon coder review may  be revised to meet current compliance requirements. Chesley Mires, MD Chesley Mires, MD 10/25/2018 8:44:07 AM This report has been signed electronically. Number of Addenda: 0

## 2018-10-25 NOTE — Progress Notes (Signed)
PROGRESS NOTE    Carolyn [name]  PJA:[id number] DOB: Jul 15, 1940 DOA: 10/21/2018 PCP: Biagio Borg, MD   Brief Narrative:  The patient is a 78 year old obese Caucasian female with a past medical history significant for but not limited to insulin-dependent diabetes mellitus, chronic diastolic CHF, hypothyroidism, tobacco abuse, chronic back pain, depression and significant anxiety who presented to the emergency room with confusion and right flank pain after a fall the night before.  Patient is currently very tangential and according to the admitting physician gave conflicting account of her fall that seem to have occurred at the night on 10/20/2018.  She eventually called her family called her family on 814 and treated 1 in the ED for further evaluation.  She was newly confused complaining of severe right flank pain which has worsened after the fall and states that her right leg also hurts now.  Patient has also been reporting that she is seeing visual hallucinations and while in the room states that she was seeing birds in the ceiling.  Upon arrival to the ED she was found to be afebrile and saturating the low 90s and hypertensive on admission.  She had a noncontrast head CT which showed no acute intracranial abnormality or skull fracture and there is no acute fracture or subluxation on cervical spine CT.  A CT of the chest was concerning for a right hilar mass causing occlusion of the right middle lobar pulmonary artery suggestive of bronchogenic carcinoma with tiny nodules at the right apex which could be satellite nodules.  CT of the abdomen pelvis was negative for any other findings.  Patient recently had hypoglycemia in the emergency department was treated with IV dextrose and she is also given a liter of lactated Ringer's morphine, Xanax and a dose of azithromycin and Rocephin.  **Interim History Remained confused today but was more alert today.  Psychiatry evaluated Dr. Mariea Clonts recommending starting  trazodone 50 mg nightly for insomnia and increasing gabapentin from 100 mg 3 times daily to 200 mg 3 times daily for anxiety and recommending continuing Xanax 1 mg 4 times daily and recommending a gradual taper given risk for falls.  Patient has the capacity to consent for SNF placement and Dr. Mariea Clonts recommending having a social worker provide resources for outpatient psychiatrist agreed counseling.  Renal function worsened and now she has an AKI but after IV fluid hydration there is improved.  PT OT evaluated and recommending a skilled nursing facility as she lacked awareness of safety yesterday but will have PT OT reevaluate again.  Leukocytosis is trending down.  Pulmonary evaluated and they are recommending a bronchoscopy and biopsy and likely this will be done tomorrow. Unfortunately MRI not be done as AICD is not compatible.  Assessment & Plan:   Principal Problem:   Adjustment disorder with anxiety Active Problems:   Hypothyroidism   Insulin dependent diabetes mellitus ([place])   Anxiety state   Depression   Essential hypertension, benign   Chronic lower back pain   Acute encephalopathy   Prolonged QT interval   Chronic diastolic CHF (congestive heart failure) (HCC)   Hypoglycemia due to insulin   Mass of right lung   Hypertensive urgency   AMS (altered mental status)  Acute Encephalopathylikely related to Hypoglycemia and ? suspected Bronchogenic Carcinoma and Recent Fall; encephalopathy is improving likely with medication induced -Presents with confusion and visual hallucinations after falling the night before and being unable to get up from floor; still not back to baseline and remains  somewhat confused again today  - ? If she synopsized and fell; Check ECHOCardiogram  -She was hypoglycemic, but AMS persists despite treatment of this -Head CT is negative for acute findings and TSH is normal in ED -She has right flank with no acute findings on CT abd/pelvis - UA showed  clear appearance, yellow in color, with trace leukocytes, negative nitrites, no bacteria seen, 0-5 RBCs per high-power field, 0-5 squamous epithelial cells and 6-10 WBCs -Wanted to Check MRI brain however unfortunately has a [employer] AICD and need to see if it is compatible and will need Rep to Come in.  This is not compatible -Check Ammonia 18, RPR was Non-Reactive, B12 314, and folate pending; -Continue supportive care -Will obtain Psych Consult for Visual Hallucinations and spoke with Dr. Tiburcio Pea who states Psychiatry will see the pateint tomorrow; psychiatry evaluated and Dr. Mariea Clonts evaluated and feels that are likely related to her medications specifically the Requip.  Dr. Lucia Gaskins has also made some recommendations and has been recommending increasing her gabapentin to 200 mg 3 times daily and tapering down her alprazolam given the risk of falls as well as starting the patient on trazodone 50 mg p.o. nightly as needed -May need to get a CT Head with Contrast now that it is confirmed that she is Unable to Get MRI but Renal Fxn severely worsened; now that renal function is improved and mentation is improving we will hold off on getting this -Patient was a little Hyponatremic yesterday but this is improved and sodium is now 142  -Continue to monitor extremely closely -Placed on delirium precautions  Fall and Generalized Weakness -PT/OT recommending SNF -PT evaluated and discussed she remained confused she does not demonstrate awareness of safety; She was admitted as observation but because of her continued confusion and lack of awareness of safety will obtain a psychiatric evaluation and change the patient inpatient status and continue IV fluid hydration for AKI. -Continue to monitor and have PT OT reevaluate  Hypoglycemia; Insulin-Dependent DM -Patient has IDDM with A1c 7.2% in January 2020, treated by EMS for hypoglycemia PTA and then received D50% again in ED -Repeat HbA1c here was 6.2  -Likely secondary to insulin/not eating, continue CBG's, hold Lantus, use low-intensity SSI as needed for now -CBG's ranging from 74-240 -Now discontinued with D5 half-normal saline rate of 75 mL's per hour as her mental status is improving  Right Lung Mass -CTA chest reveals a Right Hilar Mass concerning for bronchogenic carcinoma -Patient and family are aware of the finding -Next step will likely be Biopsy, MRI brain was ordered but could not be done due to  -Discussed with Pulmonary and they are going to arrange Bronchoscopy once Cardiac Assessment is completed; biopsy is likely going to be tomorrow and risks were discussed patient by Dr. Halford Chessman  AKI -Renal function worsened and BUN/creatinine went from 14/0.81 is now 18/1.41.  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  Chronic Diastolic CHF NICM S/p AICD -Appeared hypovolemic in ED with elevated CK and was given a  liter of IVF -Hold Lasix initially, continue beta-blocker as tolerated, and follow daily wts -Patient is +2. 385 liters and Weight is +1 lb -C/w D5 1/2 NS at 75 mL/hr given AKI -Checked echocardiogram and showed an EF of 50 to 74% and diastolic dysfunction as left ventricular diastolic Doppler parameters were consistent with pseudonormalization -Continue to Monitor Volume Status Carefully   Hypertension -BP was elevated in ED and normalized with analgesia; again was elevated today at 181/84 -Continue  metoprololTartrate 25 mg po BID  -We will add IV hydralazine 10 mg every 6 PRN for systolic blood pressure greater than 128 or diastolic blood pressure greater than 100  Hypothyroidism -TSH normal in ED - C/w Levothyroxine 50 mcg po Daily   Depression, Anxiety -Continue Bupropion 300 mg po Daily and Alprazolam 1 mg po 4 Times dailyprn Anxiety  -Dr. Mariea Clonts recommending continuing gabapentin but increasing it to 200 mg 3 times daily -Dr. Mariea Clonts also recommend starting trazodone 50 mg p.o. nightly for sleep  Prolonged QT-interval -QTc is 542 ms in ED -She has ICD in place -Replace potassium to 4 and mag to 2, minimize QT-prolonging medications, and repeat EKG in am   Hypomagnesemia -Patient magnesium level was 1.4 and now is 1.8 -Continue monitor output as necessary -Repeat magnesium level in the a.m.  Leukocytosis, improving  -In the setting of fall and being on the floor; -Worsened initially and went from 12.3 -> 14.6; Repeat this AM was 9.0 -Continue monitor and trend for signs and symptoms of infection; IV Abx have now stopped -Repeat CBC in a.m.  Normocytic Anemia -Patient's hemoglobin/hematocrit admission was 11.8/36.0; Now it is 11.0/34.7 -Checked anemia panel and showed an iron level of 55, TIBC of 225, TIBC of 280, saturation ratios of 20%, ferritin level 68, folate level of 10.7, and vitamin B12 of 323 -Continue to monitor for signs and symptoms of bleeding; currently no overt bleeding noted -Repeat CBC in a.m.  Obesity -Estimated body mass index is 31.9 kg/m as calculated from the following:   Height as of this encounter: 5' (1.524 m).   Weight as of this encounter: 74.1 kg. -Weight Loss and Dietary Counseling given   DVT prophylaxis: Enxoaparin 40 mg sq q24h Code Status: FULL CODE  Family Communication: No family present at bedside  Disposition Plan: SNF if patient and family is agreeable; she is going for biopsy tomorrow and likely can be  discharged next 5 4 to 48 hours if stable  Consultants:   Pulmonary  Psychiatry  Discussed Case with Neurology    Procedures:  ECHOCARDIOGRAM IMPRESSIONS    1. The left ventricle has low normal systolic function, with an ejection fraction of 50-55%. The cavity size was normal. There is mildly increased left ventricular wall thickness. Left ventricular diastolic Doppler parameters are consistent with  pseudonormalization. Elevated left atrial and left ventricular end-diastolic pressures The E/e' is >15. No evidence of left ventricular regional wall motion abnormalities.  2. The right ventricle has normal systolic function. The cavity was normal. There is no increase in right ventricular wall thickness.  3. The mitral valve is grossly normal.  4. The tricuspid valve is grossly normal.  5. The aortic valve is grossly normal. No stenosis of the aortic valve.  6. The aorta is normal unless otherwise noted.  SUMMARY   LVEF 50-55%, mild LVH, mild inferior hypokinesis, RV Pacer or AICD, grade 2 DD, elevated LV filling pressure, normal IVC  FINDINGS  Left Ventricle: The left ventricle has low normal systolic  function, with an ejection fraction of 50-55%. The cavity size was normal. There is mildly increased left ventricular wall thickness. Left ventricular diastolic Doppler parameters are consistent  with pseudonormalization. Elevated left atrial and left ventricular end-diastolic pressures The E/e' is >15. No evidence of left ventricular regional wall motion abnormalities..  Right Ventricle: The right ventricle has normal systolic function. The cavity was normal. There is no increase in right ventricular wall thickness. Pacing wire/catheter visualized in the right ventricle.  Left Atrium: Left atrial size was normal in size.  Right Atrium: Right atrial size was normal in size. Right atrial pressure is estimated at 10 mmHg.  Interatrial Septum: No atrial level shunt detected by  color flow Doppler.  Pericardium: There is no evidence of pericardial effusion.  Mitral Valve: The mitral valve is grossly normal. Mitral valve regurgitation is trivial by color flow Doppler.  Tricuspid Valve: The tricuspid valve is grossly normal. Tricuspid valve regurgitation is trivial by color flow Doppler.  Aortic Valve: The aortic valve is grossly normal Aortic valve regurgitation was not visualized by color flow Doppler. There is No stenosis of the aortic valve.  Pulmonic Valve: The pulmonic valve was grossly normal. Pulmonic valve regurgitation is not visualized by color flow Doppler.  Aorta: The aorta is normal unless otherwise noted.  Venous: The inferior vena cava is normal in size with greater than 50% respiratory variability.    +--------------+--------++ LEFT VENTRICLE         +----------------+---------++ +--------------+--------++ Diastology                PLAX 2D                +----------------+---------++ +--------------+--------++ LV e' lateral:  8.05 cm/s LVIDd:        4.70 cm  +----------------+---------++ +--------------+--------++ LV E/e' lateral:11.6      LVIDs:        3.50 cm  +----------------+---------++ +--------------+--------++ LV e' medial:   5.87 cm/s LV PW:        1.10 cm  +----------------+---------++ +--------------+--------++ LV E/e' medial: 15.9      LV IVS:       0.70 cm  +----------------+---------++ +--------------+--------++ LVOT diam:    2.10 cm  +--------------+--------++ LV SV:        51 ml    +--------------+--------++ LV SV Index:  28.57    +--------------+--------++ LVOT Area:    3.46 cm +--------------+--------++                        +--------------+--------++  +---------------+----------++ RIGHT VENTRICLE           +---------------+----------++ RV S prime:    13.20 cm/s +---------------+----------++ RVSP:          35.5 mmHg   +---------------+----------++  +---------------+-------++-----------++ LEFT ATRIUM           Index       +---------------+-------++-----------++ LA diam:       3.80 cm2.22 cm/m  +---------------+-------++-----------++ LA Vol (A2C):  42.4 ml24.75 ml/m +---------------+-------++-----------++ LA Vol (A4C):  46.4 ml27.09 ml/m +---------------+-------++-----------++ LA Biplane Vol:47.7 ml27.85 ml/m +---------------+-------++-----------++ +------------+---------++-----------++ RIGHT ATRIUM         Index       +------------+---------++-----------++ RA Pressure:3.00 mmHg            +------------+---------++-----------++ RA Area:    12.80 cm            +------------+---------++-----------++ RA Volume:  28.60 ml 16.70 ml/m +------------+---------++-----------++  +------------+-----------++ AORTIC VALVE            +------------+-----------++  LVOT Vmax:  115.00 cm/s +------------+-----------++ LVOT Vmean: 71.700 cm/s +------------+-----------++ LVOT VTI:   0.224 m     +------------+-----------++   +-------------+-------++ AORTA                +-------------+-------++ Ao Root diam:3.30 cm +-------------+-------++ Ao Asc diam: 3.00 cm +-------------+-------++  +--------------+----------++ +---------------+-----------++ MITRAL VALVE             TRICUSPID VALVE            +--------------+----------++ +---------------+-----------++ MV Area (PHT):3.21 cm   TR Peak grad:  32.5 mmHg   +--------------+----------++ +---------------+-----------++ MV PHT:       68.44 msec TR Vmax:       285.00 cm/s +--------------+----------++ +---------------+-----------++ MV Decel Time:236 msec   Estimated RAP: 3.00 mmHg   +--------------+----------++ +---------------+-----------++ +--------------+----------++ RVSP:          35.5 mmHg   MV E velocity:93.40 cm/s  +---------------+-----------++ +--------------+----------++ MV A velocity:64.30 cm/s +--------------+-------+ +--------------+----------++ SHUNTS                MV E/A ratio: 1.45       +--------------+-------+ +--------------+----------++ Systemic VTI: 0.22 m                               +--------------+-------+                              Systemic Diam:2.10 cm                              +--------------+-------+   Antimicrobials:  Anti-infectives (From admission, onward)   Start     Dose/Rate Route Frequency Ordered Stop   10/21/18 2245  cefTRIAXone (ROCEPHIN) 1 g in sodium chloride 0.9 % 100 mL IVPB     1 g 200 mL/hr over 30 Minutes Intravenous  Once 10/21/18 2235 10/22/18 0005   10/21/18 2245  azithromycin (ZITHROMAX) 500 mg in sodium chloride 0.9 % 250 mL IVPB     500 mg 250 mL/hr over 60 Minutes Intravenous  Once 10/21/18 2235 10/22/18 0204     Subjective: Seen and examined at bedside she appeared better today and was sitting in the chair more awake and coherent speech was not as pressured.  No chest pain, lightheadedness or dizziness.  States she is not in as much pain today.  No other concerns or complaints at this time and she is more alert and not as confused..  Objective: Vitals:   10/25/18 0920 10/25/18 0925 10/25/18 0930 10/25/18 0948  BP: (!) 192/65 (!) 185/72 (!) 184/75 (!) 151/58  Pulse: (!) 59 68 67 61  Resp: 11 17 19 18   Temp:    98 F (36.7 C)  TempSrc:    Oral  SpO2: 96% 98% 97% 94%  Weight:      Height:        Intake/Output Summary (Last 24 hours) at 10/25/2018 1321 Last data filed at 10/25/2018 1000 Gross per 24 hour  Intake 494.03 ml  Output 200 ml  Net 294.03 ml   Filed Weights   10/21/18 2302 10/22/18 1615  Weight: 73.9 kg 74.1 kg    Examination: Physical Exam:  Constitutional: Well-nourished, well-developed obese Caucasian female currently in no acute distress appears calm and comfortable sitting in the chair bedside  Eyes: Lids and conjunctive are normal.  Sclera anicteric ENMT:  External ears nose appear normal.  Grossly normal hearing Neck: Appears supple no JVD Respiratory: Slightly diminished auscultation bilaterally with no appreciable wheezing, rales, rhonchi.  Patient not tachypneic wheezing accessory muscle breathe Cardiovascular: Regular rate and rhythm.  No appreciable murmurs, rubs, gallops had very little extremity edema Abdomen: Soft, nontender, distended secondary body habitus.  Bowel sounds present GU: Deferred Musculoskeletal: No contractures or cyanosis.  No joint deformities in the upper lower extremities Skin: Skin is warm and dry no appreciable rashes or lesions on to skin evaluation t Neurologic: Cranial nerves II through XII grossly intact no appreciable focal deficits.  Romberg sign cerebellar reflexes were not assessed Psychiatric: Normal judgment and insight.  Patient is awake, alert, oriented.  Not as confused today.  Slightly anxious  Data Reviewed: I have personally reviewed following labs and imaging studies  CBC: Recent Labs  Lab 10/21/18 2154 10/22/18 0530 10/23/18 0329 10/24/18 0321 10/25/18 0643  WBC 12.3* 14.6* 10.7* 9.0 9.4  NEUTROABS 9.9* 9.1* 6.2 4.6 4.4  HGB 11.6* 11.8* 10.9* 11.0* 11.8*  HCT 35.5* 36.0  35.1 33.6* 34.7* 36.5  MCV 89.9 88.2 90.1 92.3 90.6  PLT 233 270 249 261 094   Basic Metabolic Panel: Recent Labs  Lab 10/21/18 2154 10/22/18 0530 10/23/18 0329 10/24/18 0321 10/25/18 0643  NA 137 134* 134* 142 140  K 3.7 3.5 4.0 4.0 3.9  CL 103 100 99 111 107  CO2 23 22 25 23 24   GLUCOSE 88 63* 137* 130* 147*  BUN 14 13 18 10 9   CREATININE 0.81 0.94 1.41* 0.92 0.84  CALCIUM 9.4 9.4 8.5* 8.3* 9.3  MG  --  1.4* 2.1 1.8 2.3  PHOS  --   --  4.4 3.2 3.3   GFR: Estimated Creatinine Clearance: 51.2 mL/min (by C-G formula based on SCr of 0.84 mg/dL). Liver Function Tests: Recent Labs  Lab 10/21/18 2154 10/23/18 0329 10/24/18 0321 10/25/18  0643  AST 36 26 18 19   ALT 21 19 16 19   ALKPHOS 43 40 40 43  BILITOT 0.5 0.6 0.6 0.8  PROT 6.4* 6.1* 5.8* 6.8  ALBUMIN 3.4* 3.2* 3.0* 3.6   No results for input(s): LIPASE, AMYLASE in the last 168 hours. Recent Labs  Lab 10/22/18 0530  AMMONIA 18   Coagulation Profile: Recent Labs  Lab 10/21/18 2154  INR 1.1   Cardiac Enzymes: Recent Labs  Lab 10/21/18 2154 10/23/18 1024  CKTOTAL 517* 205  CKMB  --  3.9   BNP (last 3 results) No results for input(s): PROBNP in the last 8760 hours. HbA1C: No results for input(s): HGBA1C in the last 72 hours. CBG: Recent Labs  Lab 10/24/18 1145 10/24/18 1645 10/24/18 2101 10/25/18 0948 10/25/18 1216  GLUCAP 122* 114* 133* 143* 225*   Lipid Profile: No results for input(s): CHOL, HDL, LDLCALC, TRIG, CHOLHDL, LDLDIRECT in the last 72 hours. Thyroid Function Tests: No results for input(s): TSH, T4TOTAL, FREET4, T3FREE, THYROIDAB in the last 72 hours. Anemia Panel: Recent Labs    10/24/18 0321  VITAMINB12 323  FOLATE 10.7  FERRITIN 68  TIBC 280  IRON 55  RETICCTPCT 1.5   Sepsis Labs: Recent Labs  Lab 10/21/18 2154  LATICACIDVEN 1.1    Recent Results (from the past 240 hour(s))  SARS Coronavirus 2 [facility] order, Performed in [place] hospital lab)     Status: None   Collection Time: 10/22/18  1:03 AM  Result Value Ref Range Status   SARS Coronavirus 2 NEGATIVE NEGATIVE Final  Comment: (NOTE) If result is NEGATIVE SARS-CoV-2 target nucleic acids are NOT DETECTED. The SARS-CoV-2 RNA is generally detectable in upper and lower  respiratory specimens during the acute phase of infection. The lowest  concentration of SARS-CoV-2 viral copies this assay can detect is 250  copies / mL. A negative result does not preclude SARS-CoV-2 infection  and should not be used as the sole basis for treatment or other  patient management decisions.  A negative result may occur with  improper specimen collection / handling,  submission of specimen other  than nasopharyngeal swab, presence of viral mutation(s) within the  areas targeted by this assay, and inadequate number of viral copies  (<250 copies / mL). A negative result must be combined with clinical  observations, patient history, and epidemiological information. If result is POSITIVE SARS-CoV-2 target nucleic acids are DETECTED. The SARS-CoV-2 RNA is generally detectable in upper and lower  respiratory specimens dur ing the acute phase of infection.  Positive  results are indicative of active infection with SARS-CoV-2.  Clinical  correlation with patient history and other diagnostic information is  necessary to determine patient infection status.  Positive results do  not rule out bacterial infection or co-infection with other viruses. If result is PRESUMPTIVE POSTIVE SARS-CoV-2 nucleic acids MAY BE PRESENT.   A presumptive positive result was obtained on the submitted specimen  and confirmed on repeat testing.  While 2019 novel coronavirus  (SARS-CoV-2) nucleic acids may be present in the submitted sample  additional confirmatory testing may be necessary for epidemiological  and / or clinical management purposes  to differentiate between  SARS-CoV-2 and other Sarbecovirus currently known to infect humans.  If clinically indicated additional testing with an alternate test  methodology [id number]) is advised. The SARS-CoV-2 RNA is generally  detectable in upper and lower respiratory sp ecimens during the acute  phase of infection. The expected result is Negative. Fact Sheet for Patients:  StrictlyIdeas.no Fact Sheet for Healthcare Providers: BankingDealers.co.za This test is not yet approved or cleared by the [place] FDA and has been authorized for detection and/or diagnosis of SARS-CoV-2 by FDA under an Emergency Use Authorization (EUA).  This EUA will remain in effect (meaning this test can be  used) for the duration of the COVID-19 declaration under Section 564(b)(1) of the Act, 21 U.S.C. section 360bbb-3(b)(1), unless the authorization is terminated or revoked sooner. Performed at [facility], [place] [address] [place], [place] [address]     Radiology Studies: Dg Chest Port 1 View  Result Date: 10/25/2018 CLINICAL DATA:  Status post bronchoscopy EXAM: PORTABLE CHEST 1 VIEW COMPARISON:  10/23/2018 FINDINGS: Cardiac shadow is stable. Defibrillator is again noted and stable. The lungs are well aerated bilaterally. The right perihilar mass is again identified and stable. No pneumothorax is noted following biopsy. No bony abnormality is seen. IMPRESSION: No post bronchoscopy pneumothorax is seen. Electronically Signed   By: Inez Catalina M.D.   On: 10/25/2018 09:04   Dg C-arm Bronchoscopy  Result Date: 10/25/2018 C-ARM BRONCHOSCOPY: Fluoroscopy was utilized by the requesting physician.  No radiographic interpretation.   Scheduled Meds: . atorvastatin  40 mg Oral q1800  . buPROPion  300 mg Oral Daily  . gabapentin  200 mg Oral TID  . insulin aspart  0-5 Units Subcutaneous QHS  . insulin aspart  0-9 Units Subcutaneous TID WC  . levothyroxine  50 mcg Oral Daily  . metoprolol tartrate  25 mg Oral BID  . sodium chloride flush  3 mL Intravenous Q12H  . sodium chloride flush  3 mL Intravenous Q12H   Continuous Infusions: . sodium chloride 250 mL (10/23/18 2339)  . sodium chloride 10 mL/hr at 10/25/18 0747    LOS: 2 days   Kerney Elbe, DO [facility] PAGER is on AMION  If 7PM-7AM, please contact night-coverage [email or web address] Password [email or web address] 10/25/2018, 1:21 PM

## 2018-10-25 NOTE — Progress Notes (Signed)
Video Bronchoscopy   Intervention bronchial biopsy

## 2018-10-25 NOTE — Progress Notes (Addendum)
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 U.S.C. section 360bbb-3(b)(1), unless the authorization is terminated or revoked sooner. Performed at [facility], [place] [address] [place], [place] [address]     Radiology Studies: Dg Chest Port 1 View  Result Date: 10/25/2018 CLINICAL DATA:  Status post bronchoscopy EXAM: PORTABLE CHEST 1 VIEW COMPARISON:  10/23/2018 FINDINGS: Cardiac shadow is stable. Defibrillator is again noted and stable. The lungs are well aerated bilaterally. The right perihilar mass is again identified and stable. No pneumothorax is noted following biopsy. No bony  abnormality is seen. IMPRESSION: No post bronchoscopy pneumothorax is seen. Electronically Signed   By: Inez Catalina M.D.   On: 10/25/2018 09:04   Dg C-arm Bronchoscopy  Result Date: 10/25/2018 C-ARM BRONCHOSCOPY: Fluoroscopy was utilized by the requesting physician.  No radiographic interpretation.   Scheduled Meds: . atorvastatin  40 mg Oral q1800  . buPROPion  300 mg Oral Daily  . gabapentin  200 mg Oral TID  . insulin aspart  0-5 Units Subcutaneous QHS  . insulin aspart  0-9 Units Subcutaneous TID WC  . levothyroxine  50 mcg Oral Daily  . metoprolol tartrate  25 mg Oral BID  . sodium chloride flush  3 mL Intravenous Q12H  . sodium chloride flush  3 mL Intravenous Q12H   Continuous Infusions: . sodium chloride 250 mL (10/23/18 2339)  . sodium chloride 10 mL/hr at 10/25/18 0747    LOS: 2 days   Kerney Elbe, DO [facility] PAGER is on AMION  If 7PM-7AM, please contact night-coverage [email or web address] Password [email or web address] 10/25/2018, 1:22 PM

## 2018-10-26 ENCOUNTER — Inpatient Hospital Stay (HOSPITAL_COMMUNITY): Payer: Medicare HMO

## 2018-10-26 DIAGNOSIS — R4182 Altered mental status, unspecified: Secondary | ICD-10-CM

## 2018-10-26 LAB — CBC WITH DIFFERENTIAL/PLATELET
Abs Immature Granulocytes: 0.08 10*3/uL — ABNORMAL HIGH (ref 0.00–0.07)
Basophils Absolute: 0.1 10*3/uL (ref 0.0–0.1)
Basophils Relative: 1 %
Eosinophils Absolute: 0.4 10*3/uL (ref 0.0–0.5)
Eosinophils Relative: 3 %
HCT: 37 % (ref 36.0–46.0)
Hemoglobin: 12.2 g/dL (ref 12.0–15.0)
Immature Granulocytes: 1 %
Lymphocytes Relative: 21 %
Lymphs Abs: 3.3 10*3/uL (ref 0.7–4.0)
MCH: 29.5 pg (ref 26.0–34.0)
MCHC: 33 g/dL (ref 30.0–36.0)
MCV: 89.6 fL (ref 80.0–100.0)
Monocytes Absolute: 1.3 10*3/uL — ABNORMAL HIGH (ref 0.1–1.0)
Monocytes Relative: 8 %
Neutro Abs: 10.7 10*3/uL — ABNORMAL HIGH (ref 1.7–7.7)
Neutrophils Relative %: 66 %
Platelets: 268 10*3/uL (ref 150–400)
RBC: 4.13 MIL/uL (ref 3.87–5.11)
RDW: 14.6 % (ref 11.5–15.5)
WBC: 15.9 10*3/uL — ABNORMAL HIGH (ref 4.0–10.5)
nRBC: 0 % (ref 0.0–0.2)

## 2018-10-26 LAB — COMPREHENSIVE METABOLIC PANEL
ALT: 25 U/L (ref 0–44)
AST: 26 U/L (ref 15–41)
Albumin: 3.8 g/dL (ref 3.5–5.0)
Alkaline Phosphatase: 47 U/L (ref 38–126)
Anion gap: 10 (ref 5–15)
BUN: 12 mg/dL (ref 8–23)
CO2: 23 mmol/L (ref 22–32)
Calcium: 9.3 mg/dL (ref 8.9–10.3)
Chloride: 104 mmol/L (ref 98–111)
Creatinine, Ser: 0.87 mg/dL (ref 0.44–1.00)
GFR calc Af Amer: >60 mL/min (ref >60)
GFR calc non Af Amer: >60 mL/min (ref >60)
Glucose, Bld: 207 mg/dL — ABNORMAL HIGH (ref 70–99)
Potassium: 4 mmol/L (ref 3.5–5.1)
Sodium: 137 mmol/L (ref 135–145)
Total Bilirubin: 0.6 mg/dL (ref 0.3–1.2)
Total Protein: 7 g/dL (ref 6.5–8.1)

## 2018-10-26 LAB — GLUCOSE, CAPILLARY
Glucose-Capillary: 145 mg/dL — ABNORMAL HIGH (ref 70–99)
Glucose-Capillary: 233 mg/dL — ABNORMAL HIGH (ref 70–99)
Glucose-Capillary: 124 mg/dL — ABNORMAL HIGH (ref 70–99)
Glucose-Capillary: 136 mg/dL — ABNORMAL HIGH (ref 70–99)

## 2018-10-26 LAB — MAGNESIUM: Magnesium: 1.8 mg/dL (ref 1.7–2.4)

## 2018-10-26 LAB — PHOSPHORUS: Phosphorus: 3.8 mg/dL (ref 2.5–4.6)

## 2018-10-26 MED ORDER — ENOXAPARIN SODIUM 40 MG/0.4ML ~~LOC~~ SOLN
40.0000 mg | SUBCUTANEOUS | Status: DC
  Administered 2018-10-26 – 2018-10-31 (×4): 40 mg via SUBCUTANEOUS
  Filled 2018-10-26 (×4): qty 0.4

## 2018-10-26 NOTE — Telephone Encounter (Signed)
Next transmission is 11/01/18. Will f/u if transmission was not received then.

## 2018-10-26 NOTE — Progress Notes (Signed)
Occupational Therapy Treatment Patient Details Name: Carolyn [name] MRN: [id number] DOB: 1940/11/06 Today's Date: 10/26/2018    History of present illness 78 yo female smoker with PMHx: DM, diastolic CHF, Hypothyroidism, back pain, anxiety, depression, nephrolithiasis, IBS, insomnia, HLD, Colon polyps, GERD, HTN, s/p AICD, non ischemic CM, found down after fall at home.  Unable to give consistent account of fall and has had visual hallucinations.  CT chest showed lung mass.    OT comments  Pt progressing towards acute OT goals. Remains with impaired cognition and balance and decreased safety awareness and poor insight into deficits. Pt reports that once she's back home she's going to take a shower and then "probably drive over to [employer]." Pt reports multiple family members who live nearby but still unclear if they are able to provide 24 hour supervision/assist. D/c plan remains appropriate.    Follow Up Recommendations  Supervision/Assistance - 24 hour;SNF    Equipment Recommendations  None recommended by OT    Recommendations for Other Services      Precautions / Restrictions Precautions Precautions: Fall Restrictions Weight Bearing Restrictions: No       Mobility Bed Mobility               General bed mobility comments: OOB with nurse on OT arrival  Transfers Overall transfer level: Needs assistance Equipment used: Rolling walker (2 wheeled) Transfers: Sit to/from Stand Sit to Stand: Min guard         General transfer comment: Cues for hand placement and safety    Balance Overall balance assessment: Needs assistance;History of Falls Sitting-balance support: No upper extremity supported;Feet supported Sitting balance-Leahy Scale: Fair     Standing balance support: During functional activity;Bilateral upper extremity supported Standing balance-Leahy Scale: Fair Standing balance comment: appears steady in static but dependent on RW especially for dynamic  activities                           ADL either performed or assessed with clinical judgement   ADL Overall ADL's : Needs assistance/impaired     Grooming: Wash/dry hands;Min guard;Standing                               Functional mobility during ADLs: Min guard;Rolling walker General ADL Comments: Pt standing at sink with nurse upon OT arrival. Sat for glucose test with nurse then completed community distance functional mobility.      Vision       Perception     Praxis      Cognition Arousal/Alertness: Awake/alert Behavior During Therapy: WFL for tasks assessed/performed(distractable) Overall Cognitive Status: Impaired/Different from baseline Area of Impairment: Memory;Safety/judgement;Awareness                     Memory: Decreased short-term memory   Safety/Judgement: Decreased awareness of safety;Decreased awareness of deficits Awareness: (insists she can get up alone despite education)   General Comments: Pt with decreased safety awareness, decreased insight into deficits and strong desire to do things independently. Verbal perseveration noted. Tangential and very talkative, difficulty dual tasking. Veers in the direction of person she is talking to when ambulating.         Exercises     Shoulder Instructions       General Comments No indication of hallucinations, but pt remains confused and distractable, unable to recall safety instruction, and poses fall riskt  Pertinent Vitals/ Pain       Pain Assessment: No/denies pain  Home Living Family/patient expects to be discharged to:: Private residence Living Arrangements: Alone                                      Prior Functioning/Environment              Frequency  Min 2X/week        Progress Toward Goals  OT Goals(current goals can now be found in the care plan section)  Progress towards OT goals: Progressing toward goals  Acute Rehab OT  Goals Patient Stated Goal: home OT Goal Formulation: With patient Time For Goal Achievement: 11/06/18 Potential to Achieve Goals: Good ADL Goals Pt Will Perform Grooming: with modified independence;standing Pt Will Perform Lower Body Dressing: with modified independence;sit to/from stand Pt Will Transfer to Toilet: with modified independence;ambulating;regular height toilet;grab bars Pt Will Perform Toileting - Clothing Manipulation and hygiene: with modified independence;sit to/from stand Pt Will Perform Tub/Shower Transfer: with supervision;ambulating;with caregiver independent in assisting;rolling walker;shower seat;Tub transfer Additional ADL Goal #1: Pt will demonstrate emerging awareness during morning ADL routine.  Plan Discharge plan remains appropriate    Co-evaluation                 AM-PAC OT "6 Clicks" Daily Activity     Outcome Measure   Help from another person eating meals?: None Help from another person taking care of personal grooming?: A Little Help from another person toileting, which includes using toliet, bedpan, or urinal?: A Little Help from another person bathing (including washing, rinsing, drying)?: A Little Help from another person to put on and taking off regular upper body clothing?: A Little Help from another person to put on and taking off regular lower body clothing?: A Little 6 Click Score: 19    End of Session Equipment Utilized During Treatment: Rolling walker  OT Visit Diagnosis: Other abnormalities of gait and mobility (R26.89);Muscle weakness (generalized) (M62.81);Other symptoms and signs involving cognitive function   Activity Tolerance Patient tolerated treatment well   Patient Left in chair;with call bell/phone within reach;with chair alarm set;Other (comment)(fall pad in front of recliner)   Nurse Communication          Time: 0867-6195 OT Time Calculation (min): 25 min  Charges: OT General Charges $OT Visit: 1 Visit OT  Treatments $Self Care/Home Management : 23-37 mins  Tyrone Schimke, OT Acute Rehabilitation Services Pager: [phone] Office: [phone]    [name] 10/26/2018, 12:54 PM

## 2018-10-26 NOTE — Progress Notes (Signed)
PROGRESS NOTE    [name]  GEX:[id number] DOB: 1940/06/06 DOA: 10/21/2018 PCP: Biagio Borg, MD   Brief Narrative:  HPI on 10/22/2018 by Dr. Mitzi Hansen Carolyn [name] is a 78 y.o. female with medical history significant for insulin-dependent diabetes mellitus, chronic diastolic CHF, hypothyroidism, tobacco abuse, chronic back pain, depression, and anxiety, now presenting to the emergency department with confusion and right flank pain after a fall the night before.  Patient has given multiple conflicting accounts of her fall that seem to have occurred at some point the night of 10/20/2018.  She eventually crawled to the phone and called her family on 10/21/2018 and she was brought into the ED.  She has been newly confused with family, complaining of severe right flank pain which she has had in the past but seems to have worsened after the fall.  She denies any fevers, chills, shortness of breath, or chest pain.  She reports pain at the right flank and in her back.  The patient reports that she has been experiencing visual hallucinations, and sometimes is not sure if she is hallucinating or not.  Interim history  Mental status is improved significantly psychiatry evaluated Dr. Mariea Clonts recommending starting trazodone 50 mg nightly for insomnia and increasing gabapentin from 100 mg 3 times daily to 200 mg 3 times daily for anxiety and recommending continuing Xanax 1 mg 4 times daily and recommending a gradual taper given risk for falls.  Patient has the capacity to consent for SNF placement and Dr. Mariea Clonts recommending having a social worker provide resources for outpatient psychiatrist agreed counseling.  Renal function had worsened and now she has an AKI but after IV fluid hydration there is improved.  PTOT evaluated and recommending a skilled nursing facility as she lacked awareness of safety yesterday but will have PT OT reevaluate again but she is agreeable to going to a short-term rehab facility.   Leukocytosis is trending down.  Pulmonary evaluated and they are recommending a bronchoscopy and biopsy and this was done this morning with biopsy results still pending. Unfortunately MRI not be done as AICD is not compatible. Assessment & Plan   Acute metabolic encephalopathy -possibly secondary to hypoglycemia and possible suspected bronchogenic carcinoma with recent fall -Encephalopathy has improved, patient currently alert and oriented -On admission, patient presented with confusion and visual hallucinations after falling the night prior to admission was unable to get up from the floor. -There was question of syncope -Patient was also noted to be hypoglycemic -CT head unremarkable; unable to obtain MRI due to [place] is not compatible -TSH and ammonia WNL, RPR nonreactive -Psychiatry consulted and appreciated for visual hallucinations-recommended increasing gabapentin to 200 mg 3 times daily, tapering alprazolam given risk of falls, trazodone 50 mg QHS  Fall with generalized weakness -PT/OT recommending SNF -Social work consulted  Hypoglycemia, Diabetes mellitus, type II -Hemoglobin A1c 6.2 -Upon admission, patient was noted to have hypoglycemia and was given D50.  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 -CTA chest reveals right hilar mass concerning for bronchogenic carcinoma -Patient family aware of this -Pulmonary consulted and appreciated, status post bronchoscopy, biopsy results pending -Oncology consulted and appreciated, will have oncology navigator set patient up for tumor board and clinic visit in the next couple of weeks  Acute kidney injury -Creatinine peaked to 1.41 -Was  given IV fluids -Creatinine current 0.87 -Continue to monitor BMP  Chronic diastolic heart failure/NICM -Patient with AICD  -Currently appears to be euvolemic and compensated -Lasix was initially held given acute kidney injury  -Echocardiogram shows an EF of 50 to 05%, LV diastolic Doppler parameters were consistent with pseudonormalization -Monitor intake and output, daily weight  Essential hypertension -Continue metoprolol, hydralazine as needed  Hypothyroidism -Continue Synthroid  Depression/anxiety -Continue bupropion, Xanax -Dietary consulted and appreciated, recommended continue gabapentin with increasing the dose as well as trazodone at night for sleep  Prolonged QT interval -QTC 560ms in the ED -Patient with ICD  Hypomagnesemia -magnesium 1.8 -continue to monitor and replace as needed  Leukocytosis -Had improved however after bronchoscopy yesterday is now up to 15.9 -Continue to monitor CBC  Normocytic anemia -Anemia panel showed iron 55, ferritin 68, folate 10.7, vitamin B12 323 -Hemoglobin currently stable -Continue to monitor CBC  Obesity -BMI of 31.9 -Patient to follow-up with PCP to discuss lifestyle modifications  DVT Prophylaxis  SCDs, lovenox  Code Status: Full  Family Communication: None at bedside  Disposition Plan: Admitted. Pending biopsy results and SNF placement  Consultants Pulmonology  Psychiatry   Procedures  Echocardiogram Bronchoscopy  Antibiotics   Anti-infectives (From admission, onward)   Start     Dose/Rate Route Frequency Ordered Stop   10/21/18 2245  cefTRIAXone (ROCEPHIN) 1 g in sodium chloride 0.9 % 100 mL IVPB     1 g 200 mL/hr over 30 Minutes Intravenous  Once 10/21/18 2235 10/22/18 0005   10/21/18 2245  azithromycin (ZITHROMAX) 500 mg in sodium chloride 0.9 % 250 mL IVPB     500 mg 250 mL/hr over 60 Minutes Intravenous  Once 10/21/18 2235 10/22/18 0204      Subjective:   Carolyn [name] seen and examined today.  Patient has multiple complaints and feels like she is in prison as she has the bed alarm on. She is currently eating breakfast  and complains of being interrupted for xrays and such. Patient proceeded to complain about various things for approximately 20 minutes.   Objective:   Vitals:   10/25/18 1652 10/25/18 2050 10/26/18 0435 10/26/18 0848  BP: (!) 121/51 140/66 (!) 145/50 (!) 147/80  Pulse: 66 67 68 83  Resp: 18 18 18 18   Temp: 98.3 F (36.8 C) 98 F (36.7 C) 98.4 F (36.9 C) 98.2 F (36.8 C)  TempSrc: Oral  Oral Oral  SpO2: 95% 97% 98% 97%  Weight:      Height:        Intake/Output Summary (Last 24 hours) at 10/26/2018 1248 Last data filed at 10/26/2018 0845 Gross per 24 hour  Intake 840 ml  Output 0 ml  Net 840 ml   Filed Weights   10/21/18 2302 10/22/18 1615  Weight: 73.9 kg 74.1 kg    Exam  General: Well developed, well nourished, NAD, appears stated age  36: NCAT, mucous membranes moist.   Extremities: warm dry without cyanosis clubbing or edema  Neuro: AAOx3, nonfocal  Psych: Normal affect and demeanor    Data Reviewed: I have personally reviewed following labs and imaging studies  CBC: Recent Labs  Lab 10/22/18 0530 10/23/18 0329 10/24/18 0321 10/25/18 0643 10/26/18 0819  WBC 14.6* 10.7* 9.0 9.4 15.9*  NEUTROABS 9.1* 6.2 4.6 4.4 10.7*  HGB 11.8* 10.9* 11.0* 11.8* 12.2  HCT 36.0  35.1 33.6* 34.7* 36.5 37.0  MCV 88.2 90.1 92.3 90.6 89.6  PLT 270 249 261 277 268   Basic  Metabolic Panel: Recent Labs  Lab 10/22/18 0530 10/23/18 0329 10/24/18 0321 10/25/18 0643 10/26/18 0819  NA 134* 134* 142 140 137  K 3.5 4.0 4.0 3.9 4.0  CL 100 99 111 107 104  CO2 22 25 23 24 23   GLUCOSE 63* 137* 130* 147* 207*  BUN 13 18 10 9 12   CREATININE 0.94 1.41* 0.92 0.84 0.87  CALCIUM 9.4 8.5* 8.3* 9.3 9.3  MG 1.4* 2.1 1.8 2.3 1.8  PHOS  --  4.4 3.2 3.3 3.8   GFR: Estimated Creatinine Clearance: 49.4 mL/min (by C-G formula based on SCr of 0.87 mg/dL). Liver Function Tests: Recent Labs  Lab 10/21/18 2154 10/23/18 0329 10/24/18 0321 10/25/18 0643 10/26/18 0819  AST 36  26 18 19 26   ALT 21 19 16 19 25   ALKPHOS 43 40 40 43 47  BILITOT 0.5 0.6 0.6 0.8 0.6  PROT 6.4* 6.1* 5.8* 6.8 7.0  ALBUMIN 3.4* 3.2* 3.0* 3.6 3.8   No results for input(s): LIPASE, AMYLASE in the last 168 hours. Recent Labs  Lab 10/22/18 0530  AMMONIA 18   Coagulation Profile: Recent Labs  Lab 10/21/18 2154  INR 1.1   Cardiac Enzymes: Recent Labs  Lab 10/21/18 2154 10/23/18 1024  CKTOTAL 517* 205  CKMB  --  3.9   BNP (last 3 results) No results for input(s): PROBNP in the last 8760 hours. HbA1C: No results for input(s): HGBA1C in the last 72 hours. CBG: Recent Labs  Lab 10/25/18 1216 10/25/18 1648 10/25/18 2049 10/26/18 0656 10/26/18 1148  GLUCAP 225* 97 169* 145* 233*   Lipid Profile: No results for input(s): CHOL, HDL, LDLCALC, TRIG, CHOLHDL, LDLDIRECT in the last 72 hours. Thyroid Function Tests: No results for input(s): TSH, T4TOTAL, FREET4, T3FREE, THYROIDAB in the last 72 hours. Anemia Panel: Recent Labs    10/24/18 0321  VITAMINB12 323  FOLATE 10.7  FERRITIN 68  TIBC 280  IRON 55  RETICCTPCT 1.5   Urine analysis:    Component Value Date/Time   COLORURINE YELLOW 10/22/2018 0531   APPEARANCEUR CLEAR 10/22/2018 0531   LABSPEC 1.017 10/22/2018 0531   PHURINE 7.0 10/22/2018 0531   GLUCOSEU 50 (A) 10/22/2018 0531   GLUCOSEU NEGATIVE 04/08/2018 [place] 10/22/2018 0531   BILIRUBINUR NEGATIVE 10/22/2018 0531   KETONESUR NEGATIVE 10/22/2018 0531   PROTEINUR NEGATIVE 10/22/2018 0531   UROBILINOGEN 0.2 04/08/2018 1525   NITRITE NEGATIVE 10/22/2018 0531   LEUKOCYTESUR TRACE (A) 10/22/2018 0531   Sepsis Labs: @LABRCNTIP (procalcitonin:4,lacticidven:4)  ) Recent Results (from the past 240 hour(s))  SARS Coronavirus 2 [facility] order, Performed in [place] hospital lab)     Status: None   Collection Time: 10/22/18  1:03 AM  Result Value Ref Range Status   SARS Coronavirus 2 NEGATIVE NEGATIVE Final    Comment: (NOTE) If result  is NEGATIVE SARS-CoV-2 target nucleic acids are NOT DETECTED. The SARS-CoV-2 RNA is generally detectable in upper and lower  respiratory specimens during the acute phase of infection. The lowest  concentration of SARS-CoV-2 viral copies this assay can detect is 250  copies / mL. A negative result does not preclude SARS-CoV-2 infection  and should not be used as the sole basis for treatment or other  patient management decisions.  A negative result may occur with  improper specimen collection / handling, submission of specimen other  than nasopharyngeal swab, presence of viral mutation(s) within the  areas targeted by this assay, and inadequate number of viral copies  (<250 copies /  mL). A negative result must be combined with clinical  observations, patient history, and epidemiological information. If result is POSITIVE SARS-CoV-2 target nucleic acids are DETECTED. The SARS-CoV-2 RNA is generally detectable in upper and lower  respiratory specimens dur ing the acute phase of infection.  Positive  results are indicative of active infection with SARS-CoV-2.  Clinical  correlation with patient history and other diagnostic information is  necessary to determine patient infection status.  Positive results do  not rule out bacterial infection or co-infection with other viruses. If result is PRESUMPTIVE POSTIVE SARS-CoV-2 nucleic acids MAY BE PRESENT.   A presumptive positive result was obtained on the submitted specimen  and confirmed on repeat testing.  While 2019 novel coronavirus  (SARS-CoV-2) nucleic acids may be present in the submitted sample  additional confirmatory testing may be necessary for epidemiological  and / or clinical management purposes  to differentiate between  SARS-CoV-2 and other Sarbecovirus currently known to infect humans.  If clinically indicated additional testing with an alternate test  methodology [phone]) is advised. The SARS-CoV-2 RNA is generally   detectable in upper and lower respiratory sp ecimens during the acute  phase of infection. The expected result is Negative. Fact Sheet for Patients:  StrictlyIdeas.no Fact Sheet for Healthcare Providers: BankingDealers.co.za This test is not yet approved or cleared by the [place] FDA and has been authorized for detection and/or diagnosis of SARS-CoV-2 by FDA under an Emergency Use Authorization (EUA).  This EUA will remain in effect (meaning this test can be used) for the duration of the COVID-19 declaration under Section 564(b)(1) of the Act, 21 U.S.C. section 360bbb-3(b)(1), unless the authorization is terminated or revoked sooner. Performed at [facility], [place] [address], [place], [place] [address]       Radiology Studies: Dg Chest Port 1 View  Result Date: 10/26/2018 CLINICAL DATA:  Shortness of breath EXAM: PORTABLE CHEST 1 VIEW COMPARISON:  10/25/2018 FINDINGS: Left AICD remains in place, unchanged. Heart is mildly enlarged. Increasing opacity at the right lung base laterally. Left lung clear. No effusion or acute bony abnormality. IMPRESSION: Increasing peripheral opacity at the right lung base could reflect atelectasis or infiltrate. Electronically Signed   By: Rolm Baptise M.D.   On: 10/26/2018 09:13   Dg Chest Port 1 View  Result Date: 10/25/2018 CLINICAL DATA:  Status post bronchoscopy EXAM: PORTABLE CHEST 1 VIEW COMPARISON:  10/23/2018 FINDINGS: Cardiac shadow is stable. Defibrillator is again noted and stable. The lungs are well aerated bilaterally. The right perihilar mass is again identified and stable. No pneumothorax is noted following biopsy. No bony abnormality is seen. IMPRESSION: No post bronchoscopy pneumothorax is seen. Electronically Signed   By: Inez Catalina M.D.   On: 10/25/2018 09:04   Dg C-arm Bronchoscopy  Result Date: 10/25/2018 C-ARM BRONCHOSCOPY: Fluoroscopy was utilized by the requesting  physician.  No radiographic interpretation.     Scheduled Meds: . atorvastatin  40 mg Oral q1800  . buPROPion  300 mg Oral Daily  . gabapentin  200 mg Oral TID  . insulin aspart  0-5 Units Subcutaneous QHS  . insulin aspart  0-9 Units Subcutaneous TID WC  . levothyroxine  50 mcg Oral Daily  . metoprolol tartrate  25 mg Oral BID  . sodium chloride flush  3 mL Intravenous Q12H  . sodium chloride flush  3 mL Intravenous Q12H   Continuous Infusions: . sodium chloride 250 mL (10/23/18 2339)  . sodium chloride 10 mL/hr at 10/25/18 0747  LOS: 3 days   Time Spent in minutes   30 minutes  Tarea Skillman D.O. on 10/26/2018 at 12:48 PM  Between 7am to 7pm - Please see pager noted on [email or web address]  After 7pm go to [email or web address]  And look for the night coverage person covering for me after hours  [facility] Office  [phone]

## 2018-10-27 ENCOUNTER — Encounter (HOSPITAL_COMMUNITY): Payer: Self-pay | Admitting: Emergency Medicine

## 2018-10-27 DIAGNOSIS — C3481 Malignant neoplasm of overlapping sites of right bronchus and lung: Secondary | ICD-10-CM

## 2018-10-27 DIAGNOSIS — C3491 Malignant neoplasm of unspecified part of right bronchus or lung: Secondary | ICD-10-CM

## 2018-10-27 LAB — CBC
HCT: 34.2 % — ABNORMAL LOW (ref 36.0–46.0)
Hemoglobin: 10.9 g/dL — ABNORMAL LOW (ref 12.0–15.0)
MCH: 28.9 pg (ref 26.0–34.0)
MCHC: 31.9 g/dL (ref 30.0–36.0)
MCV: 90.7 fL (ref 80.0–100.0)
Platelets: 275 10*3/uL (ref 150–400)
RBC: 3.77 MIL/uL — ABNORMAL LOW (ref 3.87–5.11)
RDW: 14.6 % (ref 11.5–15.5)
WBC: 9.6 10*3/uL (ref 4.0–10.5)
nRBC: 0 % (ref 0.0–0.2)

## 2018-10-27 LAB — BASIC METABOLIC PANEL
Anion gap: 8 (ref 5–15)
BUN: 14 mg/dL (ref 8–23)
CO2: 24 mmol/L (ref 22–32)
Calcium: 8.9 mg/dL (ref 8.9–10.3)
Chloride: 105 mmol/L (ref 98–111)
Creatinine, Ser: 0.81 mg/dL (ref 0.44–1.00)
GFR calc Af Amer: >60 mL/min (ref >60)
GFR calc non Af Amer: >60 mL/min (ref >60)
Glucose, Bld: 147 mg/dL — ABNORMAL HIGH (ref 70–99)
Potassium: 4.1 mmol/L (ref 3.5–5.1)
Sodium: 137 mmol/L (ref 135–145)

## 2018-10-27 LAB — GLUCOSE, CAPILLARY
Glucose-Capillary: 151 mg/dL — ABNORMAL HIGH (ref 70–99)
Glucose-Capillary: 148 mg/dL — ABNORMAL HIGH (ref 70–99)
Glucose-Capillary: 139 mg/dL — ABNORMAL HIGH (ref 70–99)
Glucose-Capillary: 189 mg/dL — ABNORMAL HIGH (ref 70–99)

## 2018-10-27 LAB — MAGNESIUM: Magnesium: 1.8 mg/dL (ref 1.7–2.4)

## 2018-10-27 NOTE — Progress Notes (Signed)
Physical Therapy Treatment Patient Details Name: Carolyn [name] MRN: [id number] DOB: November 08, 1940 Today's Date: 10/27/2018    History of Present Illness 78 yo female smoker with PMHx: DM, diastolic CHF, Hypothyroidism, back pain, anxiety, depression, nephrolithiasis, IBS, insomnia, HLD, Colon polyps, GERD, HTN, s/p AICD, non ischemic CM, found down after fall at home.  Unable to give consistent account of fall and has had visual hallucinations.  CT chest showed lung mass.     PT Comments    Pt is having a struggle with staying on task but did accomplish a walk and trip to [place].  Her daughter came in part way through the session which helped pt to focus on all her tasks.  Pt will continue on with rehab to increase standing balance, control of walker and safety awareness such as she can take it on.  Follow until dc to reduce time needed for walker.      Follow Up Recommendations  Supervision for mobility/OOB;SNF     Equipment Recommendations  Rolling walker with 5" wheels    Recommendations for Other Services       Precautions / Restrictions Precautions Precautions: Fall Restrictions Weight Bearing Restrictions: No    Mobility  Bed Mobility Overal bed mobility: Needs Assistance             General bed mobility comments: sitting side of bed  Transfers Overall transfer level: Needs assistance Equipment used: Rolling walker (2 wheeled) Transfers: Sit to/from Stand Sit to Stand: Min guard         General transfer comment: reminded her about hand placement but prefers to put both hands on walker  Ambulation/Gait Ambulation/Gait assistance: Min guard Gait Distance (Feet): 200 Feet Assistive device: Rolling walker (2 wheeled) Gait Pattern/deviations: Step-through pattern;Decreased stride length;Wide base of support;Trunk flexed;Drifts right/left Gait velocity: reduced Gait velocity interpretation: <1.8 ft/sec, indicate of risk for recurrent falls General Gait Details:  frequently walks out of walker esp with turns, needs to be supervised for safety   Stairs             Wheelchair Mobility    Modified Rankin (Stroke Patients Only)       Balance     Sitting balance-Leahy Scale: Fair       Standing balance-Leahy Scale: Fair Standing balance comment: requires RW for all dynamic balance skills                            Cognition Arousal/Alertness: Awake/alert Behavior During Therapy: Impulsive(distracted, questionable history) Overall Cognitive Status: Impaired/Different from baseline Area of Impairment: Memory;Safety/judgement;Awareness;Following commands;Problem solving;Attention;Orientation                 Orientation Level: Situation Current Attention Level: Selective Memory: Decreased recall of precautions;Decreased short-term memory Following Commands: Follows one step commands inconsistently;Follows one step commands with increased time Safety/Judgement: Decreased awareness of safety;Decreased awareness of deficits Awareness: Intellectual(does not understand why staff must attend all mobility) Problem Solving: Slow processing;Requires verbal cues;Requires tactile cues General Comments: persistent talking about questionable events, poor follow through on directional cues and control of walker      Exercises      General Comments General comments (skin integrity, edema, etc.): frequently objects to staff assisting her esp for [place] and with hallway walking      Pertinent Vitals/Pain Pain Assessment: No/denies pain    Home Living  Prior Function            PT Goals (current goals can now be found in the care plan section) Acute Rehab PT Goals Patient Stated Goal: home Progress towards PT goals: Progressing toward goals    Frequency    Min 3X/week      PT Plan Current plan remains appropriate    Co-evaluation              AM-PAC PT "6 Clicks" Mobility    Outcome Measure  Help needed turning from your back to your side while in a flat bed without using bedrails?: A Little Help needed moving from lying on your back to sitting on the side of a flat bed without using bedrails?: A Little Help needed moving to and from a bed to a chair (including a wheelchair)?: A Little Help needed standing up from a chair using your arms (e.g., wheelchair or bedside chair)?: A Little Help needed to walk in hospital room?: A Little Help needed climbing 3-5 steps with a railing? : A Lot 6 Click Score: 17    End of Session Equipment Utilized During Treatment: Gait belt Activity Tolerance: Patient tolerated treatment well;Patient limited by fatigue Patient left: in chair;with call bell/phone within reach;with chair alarm set;with family/visitor present Nurse Communication: Mobility status PT Visit Diagnosis: Difficulty in walking, not elsewhere classified (R26.2)     Time: 8768-1157 PT Time Calculation (min) (ACUTE ONLY): 33 min  Charges:  $Gait Training: 8-22 mins $Therapeutic Activity: 8-22 mins                   Ramond Dial 10/27/2018, 3:08 PM   Mee Hives, PT MS Acute Rehab Dept. Number: [place]

## 2018-10-27 NOTE — Progress Notes (Signed)
NAME:  Carolyn [name], MRN:  [id number], DOB:  01-Jul-1940, LOS: 4 ADMISSION DATE:  10/21/2018, CONSULTATION DATE:  10/23/2018 REFERRING MD:  Dr. Alfredia Ferguson, [facility], CHIEF COMPLAINT:  Lung mass   Brief History   78 yo female smoker fell at home and unable to get up.  CT chest showed lung mass.  History of present illness   78 yo female fell at home.  Unable to get up.  Had low blood sugar.  C/o flank pain.  Confused in ER.  She had CT imaging in ER.  CT chest showed Rt perihilar mass.    She denies cough, fever, chills, sweats, weight loss, gland swelling, voice change, vision, change, or hemoptysis.  She still smokes 1/2 PPD.  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 rate 18, height 5' (1.524 m), weight 74.3 kg, SpO2 96 %.        Intake/Output Summary (Last 24 hours) at 10/27/2018 1400 Last data filed at 10/27/2018 1359 Gross per 24 hour  Intake 889.66 ml  Output 900 ml  Net -10.34 ml   Filed Weights   10/22/18 1615 10/26/18 2102 10/27/18 0500  Weight: 74.1 kg 74.3 kg 74.3 kg    Examination:  General -obese woman, comfortable ENT -no stridor Cardiac -regular, no murmur Chest -clear bilaterally Abdomen -obese, nondistended, positive bowel sounds  Extremities -no edema Skin -some scattered bruising Neuro -follows commands, moves extremities, nonfocal Psych -somewhat distracted, repeats herself.  She is redirectable   Resolved Hospital Problem list     Assessment & Plan:   78 yo female smoker with incidental finding of Rt perihilar mass.  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  Explained that there are several options for possible therapy that will need to be discussed with oncology and the multidisciplinary thoracic oncology team.  She was quite distracted, was fixated on her desire to go home, the fact that she has had limited mobility due to bed alarm and require supervision, etc.  She did repeat back to me the information that I given, confirm that she knows that the right middle lobe mass is an adenocarcinoma.  All the same is hard for me to tell whether she grasps the for clinical significance.  She did understand that I recommended that she speak with the cancer experts and then make a plan for treatment.  She also understood that this could be done largely as an outpatient which is going to be her request.    Labs    CMP Latest Ref Rng & Units 10/27/2018 10/26/2018 10/25/2018  Glucose 70 - 99 mg/dL 147(H) 207(H) 147(H)  BUN 8 - 23 mg/dL 14 12 9  Creatinine 0.44 - 1.00 mg/dL 0.81 0.87 0.84  Sodium 135 - 145 mmol/L 137 137 140  Potassium 3.5 - 5.1 mmol/L 4.1 4.0 3.9  Chloride 98 - 111 mmol/L 105 104 107  CO2 22 - 32 mmol/L 24 23 24   Calcium 8.9 - 10.3 mg/dL 8.9 9.3 9.3  Total Protein 6.5 - 8.1 g/dL - 7.0 6.8  Total Bilirubin 0.3 - 1.2 mg/dL - 0.6 0.8  Alkaline Phos 38 - 126 U/L - 47 43  AST 15 - 41 U/L - 26 19  ALT 0 - 44 U/L - 25 19    CBC Latest Ref Rng & Units 10/27/2018 10/26/2018 10/25/2018  WBC 4.0 - 10.5 K/uL 9.6 15.9(H) 9.4  Hemoglobin 12.0 - 15.0 g/dL 10.9(L) 12.2 11.8(L)  Hematocrit 36.0 - 46.0 % 34.2(L) 37.0 36.5   Platelets 150 - 400 K/uL 275 268 277    CBG (last 3)  Recent Labs    10/26/18 2100 10/27/18 0657 10/27/18 1120  GLUCAP 136* 151* 148*   Please call if we can assist further.  Baltazar Apo, MD, PhD 10/27/2018, 2:08 PM [facility] [phone] or if no answer [phone]

## 2018-10-27 NOTE — Progress Notes (Signed)
PROGRESS NOTE    Carolyn [name]  YNW:[id number] DOB: January 15, 1941 DOA: 10/21/2018 PCP: Biagio Borg, MD   Brief Narrative:  HPI on 10/22/2018 by Dr. Mitzi Hansen Carolyn [name] is a 78 y.o. female with medical history significant for insulin-dependent diabetes mellitus, chronic diastolic CHF, hypothyroidism, tobacco abuse, chronic back pain, depression, and anxiety, now presenting to the emergency department with confusion and right flank pain after a fall the night before.  Patient has given multiple conflicting accounts of her fall that seem to have occurred at some point the night of 10/20/2018.  She eventually crawled to the phone and called her family on 10/21/2018 and she was brought into the ED.  She has been newly confused with family, complaining of severe right flank pain which she has had in the past but seems to have worsened after the fall.  She denies any fevers, chills, shortness of breath, or chest pain.  She reports pain at the right flank and in her back.  The patient reports that she has been experiencing visual hallucinations, and sometimes is not sure if she is hallucinating or not.  Interim history  Mental status is improved significantly psychiatry evaluated Dr. Mariea Clonts recommending starting trazodone 50 mg nightly for insomnia and increasing gabapentin from 100 mg 3 times daily to 200 mg 3 times daily for anxiety and recommending continuing Xanax 1 mg 4 times daily and recommending a gradual taper given risk for falls.  Patient has the capacity to consent for SNF placement and Dr. Mariea Clonts recommending having a social worker provide resources for outpatient psychiatrist agreed counseling.  Renal function had worsened and now she has an AKI but after IV fluid hydration there is improved.  PTOT evaluated and recommending a skilled nursing facility as she lacked awareness of safety yesterday but will have PT OT reevaluate again but she is agreeable to going to a short-term rehab facility.   Leukocytosis is trending down.  Pulmonary evaluated and they are recommending a bronchoscopy and biopsy and this was done this morning with biopsy results still pending. Unfortunately MRI not be done as AICD is not compatible. Assessment & Plan   Acute metabolic encephalopathy -possibly secondary to hypoglycemia and possible suspected bronchogenic carcinoma with recent fall -Encephalopathy has improved, patient currently alert and oriented -On admission, patient presented with confusion and visual hallucinations after falling the night prior to admission was unable to get up from the floor. -There was question of syncope -Patient was also noted to be hypoglycemic -CT head unremarkable; unable to obtain MRI due to [place] is not compatible -TSH and ammonia WNL, RPR nonreactive -Psychiatry consulted and appreciated for visual hallucinations-recommended increasing gabapentin to 200 mg 3 times daily, tapering alprazolam given risk of falls, trazodone 50 mg QHS  Fall with generalized weakness -PT/OT recommending SNF -Social work consulted  Hypoglycemia, Diabetes mellitus, type II -Hemoglobin A1c 6.2 -Upon admission, patient was noted to have hypoglycemia and was given D50.  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 -CTA chest reveals right hilar mass concerning for bronchogenic carcinoma -Patient family aware of this -Pulmonary consulted and appreciated, status post bronchoscopy, biopsy results pending -Oncology consulted and appreciated, will have oncology navigator set patient up for tumor board and clinic visit in the next couple of weeks  Acute kidney injury -Creatinine peaked to 1.41 -Was  given IV fluids -Creatinine current 0.81 -Continue to monitor BMP  Chronic diastolic heart failure/NICM -Patient with AICD  -Currently appears to be euvolemic and compensated -Lasix was initially held given acute kidney injury  -Echocardiogram shows an EF of 50 to 00%, LV diastolic Doppler parameters were consistent with pseudonormalization -Monitor intake and output, daily weight  Essential hypertension -Continue metoprolol, hydralazine as needed  Hypothyroidism -Continue Synthroid  Depression/anxiety -Continue bupropion, Xanax -Dietary consulted and appreciated, recommended continue gabapentin with increasing the dose as well as trazodone at night for sleep  Prolonged QT interval -QTC 556ms in the ED -Patient with ICD  Hypomagnesemia -magnesium 1.8 -continue to monitor and replace as needed  Leukocytosis -Improved, down to 9.6 today -Continue to monitor CBC  Normocytic anemia -Anemia panel showed iron 55, ferritin 68, folate 10.7, vitamin B12 323 -Hemoglobin currently stable -Continue to monitor CBC  Obesity -BMI of 31.9 -Patient to follow-up with PCP to discuss lifestyle modifications  DVT Prophylaxis  SCDs, lovenox  Code Status: Full  Family Communication: None at bedside  Disposition Plan: Admitted. Pending biopsy results and SNF placement  Consultants Pulmonology  Psychiatry   Procedures  Echocardiogram Bronchoscopy  Antibiotics   Anti-infectives (From admission, onward)   Start     Dose/Rate Route Frequency Ordered Stop   10/21/18 2245  cefTRIAXone (ROCEPHIN) 1 g in sodium chloride 0.9 % 100 mL IVPB     1 g 200 mL/hr over 30 Minutes Intravenous  Once 10/21/18 2235 10/22/18 0005   10/21/18 2245  azithromycin (ZITHROMAX) 500 mg in sodium chloride 0.9 % 250 mL IVPB     500 mg 250 mL/hr over 60 Minutes Intravenous  Once 10/21/18 2235 10/22/18 0204      Subjective:   Carolyn [name] seen and examined today.  Feels she would be better at home or a "dog house" as compared to staying in the hospital. Has no new specific complaints.   Objective:   Vitals:   10/26/18 2102  10/27/18 0500 10/27/18 0526 10/27/18 0746  BP: 122/60  128/78 119/60  Pulse: 65  68 65  Resp: 18  18 18   Temp: 98 F (36.7 C)  98.5 F (36.9 C) 98 F (36.7 C)  TempSrc: Oral  Oral Oral  SpO2: 97%  100% 96%  Weight: 74.3 kg 74.3 kg    Height:        Intake/Output Summary (Last 24 hours) at 10/27/2018 1150 Last data filed at 10/27/2018 1041 Gross per 24 hour  Intake 1069.66 ml  Output 900 ml  Net 169.66 ml   Filed Weights   10/22/18 1615 10/26/18 2102 10/27/18 0500  Weight: 74.1 kg 74.3 kg 74.3 kg   Exam  General: Well developed, well nourished, NAD, appears stated age  31: NCAT, mucous membranes moist.   Cardiovascular: S1 S2 auscultated, RRR, no murmur  Respiratory: Clear to auscultation bilaterally  Abdomen: Soft, obese, nontender, nondistended, + bowel sounds  Extremities: warm dry without cyanosis clubbing or edema  Neuro: AAOx3, nonfocal  Psych: Appropriate mood and affect   Data Reviewed: I have personally reviewed following labs and imaging studies  CBC: Recent Labs  Lab 10/22/18 0530 10/23/18 0329 10/24/18 0321 10/25/18 0643 10/26/18 0819 10/27/18 0417  WBC 14.6* 10.7* 9.0 9.4 15.9* 9.6  NEUTROABS 9.1* 6.2 4.6 4.4 10.7*  --   HGB 11.8* 10.9* 11.0* 11.8* 12.2 10.9*  HCT 36.0  35.1 33.6* 34.7* 36.5 37.0 34.2*  MCV 88.2 90.1 92.3 90.6 89.6 90.7  PLT 270 249 261 277  268 242   Basic Metabolic Panel: Recent Labs  Lab 10/23/18 0329 10/24/18 0321 10/25/18 0643 10/26/18 0819 10/27/18 0417  NA 134* 142 140 137 137  K 4.0 4.0 3.9 4.0 4.1  CL 99 111 107 104 105  CO2 25 23 24 23 24   GLUCOSE 137* 130* 147* 207* 147*  BUN 18 10 9 12 14   CREATININE 1.41* 0.92 0.84 0.87 0.81  CALCIUM 8.5* 8.3* 9.3 9.3 8.9  MG 2.1 1.8 2.3 1.8 1.8  PHOS 4.4 3.2 3.3 3.8  --    GFR: Estimated Creatinine Clearance: 53.2 mL/min (by C-G formula based on SCr of 0.81 mg/dL). Liver Function Tests: Recent Labs  Lab 10/21/18 2154 10/23/18 0329 10/24/18 0321 10/25/18  0643 10/26/18 0819  AST 36 26 18 19 26   ALT 21 19 16 19 25   ALKPHOS 43 40 40 43 47  BILITOT 0.5 0.6 0.6 0.8 0.6  PROT 6.4* 6.1* 5.8* 6.8 7.0  ALBUMIN 3.4* 3.2* 3.0* 3.6 3.8   No results for input(s): LIPASE, AMYLASE in the last 168 hours. Recent Labs  Lab 10/22/18 0530  AMMONIA 18   Coagulation Profile: Recent Labs  Lab 10/21/18 2154  INR 1.1   Cardiac Enzymes: Recent Labs  Lab 10/21/18 2154 10/23/18 1024  CKTOTAL 517* 205  CKMB  --  3.9   BNP (last 3 results) No results for input(s): PROBNP in the last 8760 hours. HbA1C: No results for input(s): HGBA1C in the last 72 hours. CBG: Recent Labs  Lab 10/26/18 1148 10/26/18 1606 10/26/18 2100 10/27/18 0657 10/27/18 1120  GLUCAP 233* 124* 136* 151* 148*   Lipid Profile: No results for input(s): CHOL, HDL, LDLCALC, TRIG, CHOLHDL, LDLDIRECT in the last 72 hours. Thyroid Function Tests: No results for input(s): TSH, T4TOTAL, FREET4, T3FREE, THYROIDAB in the last 72 hours. Anemia Panel: No results for input(s): VITAMINB12, FOLATE, FERRITIN, TIBC, IRON, RETICCTPCT in the last 72 hours. Urine analysis:    Component Value Date/Time   COLORURINE YELLOW 10/22/2018 [place] 10/22/2018 0531   LABSPEC 1.017 10/22/2018 0531   PHURINE 7.0 10/22/2018 0531   GLUCOSEU 50 (A) 10/22/2018 0531   GLUCOSEU NEGATIVE 04/08/2018 1525   HGBUR NEGATIVE 10/22/2018 0531   BILIRUBINUR NEGATIVE 10/22/2018 0531   KETONESUR NEGATIVE 10/22/2018 0531   PROTEINUR NEGATIVE 10/22/2018 0531   UROBILINOGEN 0.2 04/08/2018 1525   NITRITE NEGATIVE 10/22/2018 0531   LEUKOCYTESUR TRACE (A) 10/22/2018 0531   Sepsis Labs: @LABRCNTIP (procalcitonin:4,lacticidven:4)  ) Recent Results (from the past 240 hour(s))  SARS Coronavirus 2 [facility] order, Performed in [place] hospital lab)     Status: None   Collection Time: 10/22/18  1:03 AM  Result Value Ref Range Status   SARS Coronavirus 2 NEGATIVE NEGATIVE Final    Comment:  (NOTE) If result is NEGATIVE SARS-CoV-2 target nucleic acids are NOT DETECTED. The SARS-CoV-2 RNA is generally detectable in upper and lower  respiratory specimens during the acute phase of infection. The lowest  concentration of SARS-CoV-2 viral copies this assay can detect is 250  copies / mL. A negative result does not preclude SARS-CoV-2 infection  and should not be used as the sole basis for treatment or other  patient management decisions.  A negative result may occur with  improper specimen collection / handling, submission of specimen other  than nasopharyngeal swab, presence of viral mutation(s) within the  areas targeted by this assay, and inadequate number of viral copies  (<250 copies / mL). A negative result must be  combined with clinical  observations, patient history, and epidemiological information. If result is POSITIVE SARS-CoV-2 target nucleic acids are DETECTED. The SARS-CoV-2 RNA is generally detectable in upper and lower  respiratory specimens dur ing the acute phase of infection.  Positive  results are indicative of active infection with SARS-CoV-2.  Clinical  correlation with patient history and other diagnostic information is  necessary to determine patient infection status.  Positive results do  not rule out bacterial infection or co-infection with other viruses. If result is PRESUMPTIVE POSTIVE SARS-CoV-2 nucleic acids MAY BE PRESENT.   A presumptive positive result was obtained on the submitted specimen  and confirmed on repeat testing.  While 2019 novel coronavirus  (SARS-CoV-2) nucleic acids may be present in the submitted sample  additional confirmatory testing may be necessary for epidemiological  and / or clinical management purposes  to differentiate between  SARS-CoV-2 and other Sarbecovirus currently known to infect humans.  If clinically indicated additional testing with an alternate test  methodology [phone]) is advised. The SARS-CoV-2 RNA is  generally  detectable in upper and lower respiratory sp ecimens during the acute  phase of infection. The expected result is Negative. Fact Sheet for Patients:  StrictlyIdeas.no Fact Sheet for Healthcare Providers: BankingDealers.co.za This test is not yet approved or cleared by the [place] FDA and has been authorized for detection and/or diagnosis of SARS-CoV-2 by FDA under an Emergency Use Authorization (EUA).  This EUA will remain in effect (meaning this test can be used) for the duration of the COVID-19 declaration under Section 564(b)(1) of the Act, 21 U.S.C. section 360bbb-3(b)(1), unless the authorization is terminated or revoked sooner. Performed at [facility], [place] [address], [place], [place] [address]       Radiology Studies: Dg Chest Port 1 View  Result Date: 10/26/2018 CLINICAL DATA:  Shortness of breath EXAM: PORTABLE CHEST 1 VIEW COMPARISON:  10/25/2018 FINDINGS: Left AICD remains in place, unchanged. Heart is mildly enlarged. Increasing opacity at the right lung base laterally. Left lung clear. No effusion or acute bony abnormality. IMPRESSION: Increasing peripheral opacity at the right lung base could reflect atelectasis or infiltrate. Electronically Signed   By: Rolm Baptise M.D.   On: 10/26/2018 09:13     Scheduled Meds: . atorvastatin  40 mg Oral q1800  . buPROPion  300 mg Oral Daily  . enoxaparin (LOVENOX) injection  40 mg Subcutaneous Q24H  . gabapentin  200 mg Oral TID  . insulin aspart  0-5 Units Subcutaneous QHS  . insulin aspart  0-9 Units Subcutaneous TID WC  . levothyroxine  50 mcg Oral Daily  . metoprolol tartrate  25 mg Oral BID  . sodium chloride flush  3 mL Intravenous Q12H  . sodium chloride flush  3 mL Intravenous Q12H   Continuous Infusions: . sodium chloride 250 mL (10/23/18 2339)  . sodium chloride 10 mL/hr at 10/25/18 0747     LOS: 4 days   Time Spent in minutes   30 minutes   Dahmir Epperly D.O. on 10/27/2018 at 11:50 AM  Between 7am to 7pm - Please see pager noted on [email or web address]  After 7pm go to [email or web address]  And look for the night coverage person covering for me after hours  [facility] Office  [phone]

## 2018-10-27 NOTE — Care Management Important Message (Signed)
Important Message  Patient Details  Name: Carolyn [name] MRN: [id number] Date of Birth: 01-29-1941   Medicare Important Message Given:  Yes     Orbie Pyo 10/27/2018, 2:54 PM

## 2018-10-28 ENCOUNTER — Other Ambulatory Visit: Payer: Self-pay | Admitting: Internal Medicine

## 2018-10-28 LAB — HEMOGLOBIN AND HEMATOCRIT, BLOOD
HCT: 36.5 % (ref 36.0–46.0)
Hemoglobin: 12 g/dL (ref 12.0–15.0)

## 2018-10-28 LAB — GLUCOSE, CAPILLARY
Glucose-Capillary: 179 mg/dL — ABNORMAL HIGH (ref 70–99)
Glucose-Capillary: 150 mg/dL — ABNORMAL HIGH (ref 70–99)
Glucose-Capillary: 136 mg/dL — ABNORMAL HIGH (ref 70–99)
Glucose-Capillary: 187 mg/dL — ABNORMAL HIGH (ref 70–99)

## 2018-10-28 NOTE — Progress Notes (Signed)
PROGRESS NOTE    Carolyn [name]  JKD:[id number] DOB: 11-Sep-1940 DOA: 10/21/2018 PCP: Biagio Borg, MD   Brief Narrative:  HPI on 10/22/2018 by Dr. Mitzi Hansen Carolyn [name] is a 78 y.o. female with medical history significant for insulin-dependent diabetes mellitus, chronic diastolic CHF, hypothyroidism, tobacco abuse, chronic back pain, depression, and anxiety, now presenting to the emergency department with confusion and right flank pain after a fall the night before.  Patient has given multiple conflicting accounts of her fall that seem to have occurred at some point the night of 10/20/2018.  She eventually crawled to the phone and called her family on 10/21/2018 and she was brought into the ED.  She has been newly confused with family, complaining of severe right flank pain which she has had in the past but seems to have worsened after the fall.  She denies any fevers, chills, shortness of breath, or chest pain.  She reports pain at the right flank and in her back.  The patient reports that she has been experiencing visual hallucinations, and sometimes is not sure if she is hallucinating or not.  Interim history  Mental status is improved significantly psychiatry evaluated Dr. Mariea Clonts recommending starting trazodone 50 mg nightly for insomnia and increasing gabapentin from 100 mg 3 times daily to 200 mg 3 times daily for anxiety and recommending continuing Xanax 1 mg 4 times daily and recommending a gradual taper given risk for falls.  Patient has the capacity to consent for SNF placement and Dr. Mariea Clonts recommending having a social worker provide resources for outpatient psychiatrist agreed counseling.  Renal function had worsened and now she has an AKI but after IV fluid hydration there is improved.  PTOT evaluated and recommending a skilled nursing facility as she lacked awareness of safety yesterday but will have PT OT reevaluate again but she is agreeable to going to a short-term rehab facility.   Leukocytosis is trending down.  Pulmonary evaluated and they are recommending a bronchoscopy and biopsy and this was done this morning with biopsy results still pending. Unfortunately MRI not be done as AICD is not compatible. Assessment & Plan   Acute metabolic encephalopathy -possibly secondary to hypoglycemia and possible suspected bronchogenic carcinoma with recent fall -Encephalopathy has improved, patient currently alert and oriented -On admission, patient presented with confusion and visual hallucinations after falling the night prior to admission was unable to get up from the floor. -There was question of syncope -Patient was also noted to be hypoglycemic -CT head unremarkable; unable to obtain MRI due to [place] is not compatible -TSH and ammonia WNL, RPR nonreactive -Psychiatry consulted and appreciated for visual hallucinations-recommended increasing gabapentin to 200 mg 3 times daily, tapering alprazolam given risk of falls, trazodone 50 mg QHS  Fall with generalized weakness -PT/OT recommending SNF -Social work consulted  Hypoglycemia, Diabetes mellitus, type II -Hemoglobin A1c 6.2 -Upon admission, patient was noted to have hypoglycemia and was given D50.  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 -CTA chest reveals right hilar mass concerning for bronchogenic carcinoma -Patient family aware of this -Pulmonary consulted and appreciated, status post bronchoscopy, biopsy showing adenocarcinoma -Oncology consulted and appreciated, will have oncology navigator set patient up for tumor board and clinic visit in the next couple of weeks  Acute kidney injury -Creatinine peaked to 1.41 -Was  given IV fluids -Creatinine current 0.81 -Continue to monitor BMP  Chronic diastolic heart failure/NICM -Patient with  AICD -Currently appears to be euvolemic and compensated -Lasix was initially held given acute kidney injury  -Echocardiogram shows an EF of 50 to 06%, LV diastolic Doppler parameters were consistent with pseudonormalization -Monitor intake and output, daily weight  Essential hypertension -Continue metoprolol, hydralazine as needed  Hypothyroidism -Continue Synthroid  Depression/anxiety -Continue bupropion, Xanax -Dietary consulted and appreciated, recommended continue gabapentin with increasing the dose as well as trazodone at night for sleep  Prolonged QT interval -QTC 535ms in the E -Patient with ICD  Hypomagnesemia -magnesium 1.8 -continue to monitor and replace as needed  Leukocytosis -resolved -Continue to monitor CBC  Normocytic anemia -Anemia panel showed iron 55, ferritin 68, folate 10.7, vitamin B12 323 -Hemoglobin currently stable -Continue to monitor CBC  Obesity -BMI of 31.9 -Patient to follow-up with PCP to discuss lifestyle modifications  DVT Prophylaxis  SCDs, lovenox  Code Status: Full  Family Communication: None at bedside  Disposition Plan: Admitted. Pending biopsy results and SNF placement  Consultants Pulmonology  Psychiatry   Procedures  Echocardiogram Bronchoscopy  Antibiotics   Anti-infectives (From admission, onward)   Start     Dose/Rate Route Frequency Ordered Stop   10/21/18 2245  cefTRIAXone (ROCEPHIN) 1 g in sodium chloride 0.9 % 100 mL IVPB     1 g 200 mL/hr over 30 Minutes Intravenous  Once 10/21/18 2235 10/22/18 0005   10/21/18 2245  azithromycin (ZITHROMAX) 500 mg in sodium chloride 0.9 % 250 mL IVPB     500 mg 250 mL/hr over 60 Minutes Intravenous  Once 10/21/18 2235 10/22/18 0204      Subjective:   Carolyn [name] seen and examined today.  States she would like to go home before going to rehab. Denies chest pain, shortness of breath, abdominal pain. States she does not like having the bed alarm on.   Objective:    Vitals:   10/27/18 1641 10/27/18 2008 10/28/18 0439 10/28/18 0850  BP: (!) 109/52 109/77 (!) 155/52 (!) 145/88  Pulse: 62 73 65 74  Resp: 18 18 18 18   Temp: 97.9 F (36.6 C) 98.3 F (36.8 C) 98.3 F (36.8 C) 98.1 F (36.7 C)  TempSrc: Oral Oral Oral Oral  SpO2: 95% 96% 95% 96%  Weight:  74.1 kg    Height:        Intake/Output Summary (Last 24 hours) at 10/28/2018 1219 Last data filed at 10/28/2018 1150 Gross per 24 hour  Intake 1124.5 ml  Output 450 ml  Net 674.5 ml   Filed Weights   10/26/18 2102 10/27/18 0500 10/27/18 2008  Weight: 74.3 kg 74.3 kg 74.1 kg   Exam  General: Well developed, well nourished, NAD, appears stated age  HEENT: NCAT, mucous membranes moist.   Cardiovascular: S1 S2 auscultated, RRR  Respiratory: Clear to auscultation bilaterally   Abdomen: Soft, obese, nontender, nondistended, + bowel sounds  Extremities: warm dry without cyanosis clubbing or edema  Neuro: AAOx3, nonfocal  Psych: Appropriate mood and affect  Data Reviewed: I have personally reviewed following labs and imaging studies  CBC: Recent Labs  Lab 10/22/18 0530 10/23/18 0329 10/24/18 0321 10/25/18 0643 10/26/18 0819 10/27/18 0417 10/28/18 0745  WBC 14.6* 10.7* 9.0 9.4 15.9* 9.6  --   NEUTROABS 9.1* 6.2 4.6 4.4 10.7*  --   --   HGB 11.8* 10.9* 11.0* 11.8* 12.2 10.9* 12.0  HCT 36.0   35.1 33.6* 34.7* 36.5 37.0  34.2* 36.5  MCV 88.2 90.1 92.3 90.6 89.6 90.7  --   PLT 270 249 261 277 268 275  --    Basic Metabolic Panel: Recent Labs  Lab 10/23/18 0329 10/24/18 0321 10/25/18 0643 10/26/18 0819 10/27/18 0417  NA 134* 142 140 137 137  K 4.0 4.0 3.9 4.0 4.1  CL 99 111 107 104 105  CO2 25 23 24 23 24   GLUCOSE 137* 130* 147* 207* 147*  BUN 18 10 9 12 14   CREATININE 1.41* 0.92 0.84 0.87 0.81  CALCIUM 8.5* 8.3* 9.3 9.3 8.9  MG 2.1 1.8 2.3 1.8 1.8  PHOS 4.4 3.2 3.3 3.8  --    GFR: Estimated Creatinine Clearance: 53.1 mL/min (by C-G formula based on SCr of 0.81  mg/dL). Liver Function Tests: Recent Labs  Lab 10/21/18 2154 10/23/18 0329 10/24/18 0321 10/25/18 0643 10/26/18 0819  AST 36 26 18 19 26   ALT 21 19 16 19 25   ALKPHOS 43 40 40 43 47  BILITOT 0.5 0.6 0.6 0.8 0.6  PROT 6.4* 6.1* 5.8* 6.8 7.0  ALBUMIN 3.4* 3.2* 3.0* 3.6 3.8   No results for input(s): LIPASE, AMYLASE in the last 168 hours. Recent Labs  Lab 10/22/18 0530  AMMONIA 18   Coagulation Profile: Recent Labs  Lab 10/21/18 2154  INR 1.1   Cardiac Enzymes: Recent Labs  Lab 10/21/18 2154 10/23/18 1024  CKTOTAL 517* 205  CKMB  --  3.9   BNP (last 3 results) No results for input(s): PROBNP in the last 8760 hours. HbA1C: No results for input(s): HGBA1C in the last 72 hours. CBG: Recent Labs  Lab 10/27/18 1120 10/27/18 1643 10/27/18 2118 10/28/18 0631 10/28/18 1119  GLUCAP 148* 139* 189* 179* 150*   Lipid Profile: No results for input(s): CHOL, HDL, LDLCALC, TRIG, CHOLHDL, LDLDIRECT in the last 72 hours. Thyroid Function Tests: No results for input(s): TSH, T4TOTAL, FREET4, T3FREE, THYROIDAB in the last 72 hours. Anemia Panel: No results for input(s): VITAMINB12, FOLATE, FERRITIN, TIBC, IRON, RETICCTPCT in the last 72 hours. Urine analysis:    Component Value Date/Time   COLORURINE YELLOW 10/22/2018 [place] 10/22/2018 0531   LABSPEC 1.017 10/22/2018 0531   PHURINE 7.0 10/22/2018 0531   GLUCOSEU 50 (A) 10/22/2018 0531   GLUCOSEU NEGATIVE 04/08/2018 1525   HGBUR NEGATIVE 10/22/2018 0531   BILIRUBINUR NEGATIVE 10/22/2018 0531   KETONESUR NEGATIVE 10/22/2018 0531   PROTEINUR NEGATIVE 10/22/2018 0531   UROBILINOGEN 0.2 04/08/2018 1525   NITRITE NEGATIVE 10/22/2018 0531   LEUKOCYTESUR TRACE (A) 10/22/2018 0531   Sepsis Labs: @LABRCNTIP (procalcitonin:4,lacticidven:4)  ) Recent Results (from the past 240 hour(s))  SARS Coronavirus 2 [facility] order, Performed in [place] hospital lab)     Status: None   Collection Time: 10/22/18   1:03 AM  Result Value Ref Range Status   SARS Coronavirus 2 NEGATIVE NEGATIVE Final    Comment: (NOTE) If result is NEGATIVE SARS-CoV-2 target nucleic acids are NOT DETECTED. The SARS-CoV-2 RNA is generally detectable in upper and lower  respiratory specimens during the acute phase of infection. The lowest  concentration of SARS-CoV-2 viral copies this assay can detect is 250  copies / mL. A negative result does not preclude SARS-CoV-2 infection  and should not be used as the sole basis for treatment or other  patient management decisions.  A negative result may occur with  improper specimen collection / handling, submission of specimen other  than nasopharyngeal swab, presence of viral mutation(s) within the  areas targeted by this assay, and inadequate number of viral copies  (<250 copies / mL). A negative result must be combined with clinical  observations, patient history, and epidemiological information. If result is POSITIVE SARS-CoV-2 target nucleic acids are DETECTED. The SARS-CoV-2 RNA is generally detectable in upper and lower  respiratory specimens dur ing the acute phase of infection.  Positive  results are indicative of active infection with SARS-CoV-2.  Clinical  correlation with patient history and other diagnostic information is  necessary to determine patient infection status.  Positive results do  not rule out bacterial infection or co-infection with other viruses. If result is PRESUMPTIVE POSTIVE SARS-CoV-2 nucleic acids MAY BE PRESENT.   A presumptive positive result was obtained on the submitted specimen  and confirmed on repeat testing.  While 2019 novel coronavirus  (SARS-CoV-2) nucleic acids may be present in the submitted sample  additional confirmatory testing may be necessary for epidemiological  and / or clinical management purposes  to differentiate between  SARS-CoV-2 and other Sarbecovirus currently known to infect humans.  If clinically indicated  additional testing with an alternate test  methodology [phone]) is advised. The SARS-CoV-2 RNA is generally  detectable in upper and lower respiratory sp ecimens during the acute  phase of infection. The expected result is Negative. Fact Sheet for Patients:  StrictlyIdeas.no Fact Sheet for Healthcare Providers: BankingDealers.co.za This test is not yet approved or cleared by the [place] FDA and has been authorized for detection and/or diagnosis of SARS-CoV-2 by FDA under an Emergency Use Authorization (EUA).  This EUA will remain in effect (meaning this test can be used) for the duration of the COVID-19 declaration under Section 564(b)(1) of the Act, 21 U.S.C. section 360bbb-3(b)(1), unless the authorization is terminated or revoked sooner. Performed at [facility], [place] [address] [place], [place] [address]       Radiology Studies: No results found.   Scheduled Meds:  atorvastatin  40 mg Oral q1800   buPROPion  300 mg Oral Daily   enoxaparin (LOVENOX) injection  40 mg Subcutaneous Q24H   gabapentin  200 mg Oral TID   insulin aspart  0-5 Units Subcutaneous QHS   insulin aspart  0-9 Units Subcutaneous TID WC   levothyroxine  50 mcg Oral Daily   metoprolol tartrate  25 mg Oral BID   sodium chloride flush  3 mL Intravenous Q12H   sodium chloride flush  3 mL Intravenous Q12H   Continuous Infusions:  sodium chloride 250 mL (10/23/18 2339)   sodium chloride 10 mL/hr at 10/25/18 0747     LOS: 5 days   Time Spent in minutes   30 minutes  Filbert Craze D.O. on 10/28/2018 at 12:19 PM  Between 7am to 7pm - Please see pager noted on [email or web address]  After 7pm go to [email or web address]  And look for the night coverage person covering for me after hours  [facility] Office  [phone]

## 2018-10-28 NOTE — Telephone Encounter (Signed)
Done erx 

## 2018-10-28 NOTE — NC FL2 (Signed)
[place] MEDICAID FL2 LEVEL OF CARE SCREENING TOOL     IDENTIFICATION  Patient Name: Carolyn [name] Birthdate: 1940/12/17 Sex: female Admission Date (Current Location): 10/21/2018  [place] and [place] Number:  Carolyn [name] [id number] [place] and Address:  [facility] [place], [place] [address] [place], [place] [address]      Provider Number: [id number]  Attending Physician Name and Address:  Cristal Ford, DO  Relative Name and Phone Number:  Armond Hang., [phone] and  Enid Baas, [phone]    Current Level of Care: Hospital Recommended Level of Care: [place] Prior Approval Number:    Date Approved/Denied:   PASRR Number: (Submitted for PASRR 8/21 - MUST UU#[id number])  Discharge Plan: SNF    Current Diagnoses: Patient Active Problem List   Diagnosis Date Noted  . Adenocarcinoma of right lung ([place])   . Adjustment disorder with anxiety   . AMS (altered mental status) 10/23/2018  . Acute encephalopathy 10/22/2018  . Prolonged QT interval 10/22/2018  . Chronic diastolic CHF (congestive heart failure) ([place]) 10/22/2018  . Hypoglycemia due to insulin 10/22/2018  . Mass of right lung 10/22/2018  . Hypertensive urgency 10/22/2018  . Trapezoid ligament sprain 07/21/2017  . Right lumbar radiculopathy 07/21/2017  . Spondylosis without myelopathy or radiculopathy, lumbar region 03/10/2016  . Ganglion cyst of flexor tendon sheath of finger of left hand 09/24/2015  . Back pain 09/24/2015  . Cellulitis of toe of right foot 08/15/2013  . ICD-St.Jude 10/26/2011  . Chronic lower back pain 06/01/2011  . Dizziness 07/04/2010  . Allergic rhinitis 07/04/2010  . Preventative health care 05/30/2010  . Hormone replacement therapy (postmenopausal) 05/30/2010  . Essential hypertension, benign 02/05/2010  . JOINT EFFUSION, RIGHT KNEE 12/03/2009  . FLANK PAIN, RIGHT 06/03/2009  . INSOMNIA-SLEEP DISORDER-UNSPEC 02/12/2009  . LUMBAR  RADICULOPATHY, LEFT 08/28/2008  . Hypothyroidism 07/04/2008  . CARDIOMYOPATHY 03/20/2008  . Chronic systolic dysfunction of left ventricle 12/20/2007  . ECHOCARDIOGRAM, ABNORMAL 12/20/2007  . STRESS ELECTROCARDIOGRAM, ABNORMAL 12/20/2007  . Insulin dependent diabetes mellitus ([place]) 12/13/2007  . Left bundle branch block 12/12/2007  . SKIN LESIONS, MULTIPLE 08/30/2007  . FATIGUE 08/30/2007  . Tobacco use disorder 06/13/2007  . Depression 12/20/2006  . GERD 12/20/2006  . BACK PAIN, LUMBAR, CHRONIC 12/20/2006  . NEPHROLITHIASIS, HX OF 12/20/2006  . THYROID NODULE 10/24/2006  . Hyperlipidemia 10/24/2006  . Anxiety state 10/24/2006  . Irritable bowel syndrome 10/24/2006  . UTI'S, RECURRENT 10/24/2006  . COLONIC POLYPS, ADENOMATOUS, HX OF 10/24/2006    Orientation RESPIRATION BLADDER Height & Weight     Self, Situation, Place  Normal Continent Weight: 163 lb 5.8 oz (74.1 kg) Height:  5' (152.4 cm)  BEHAVIORAL SYMPTOMS/MOOD NEUROLOGICAL BOWEL NUTRITION STATUS      Continent Diet(Heart healthy)  AMBULATORY STATUS COMMUNICATION OF NEEDS Skin   Limited Assist(Min guard) Verbally Skin abrasions(Skin tear right arm)                       Personal Care Assistance Level of Assistance  Bathing, Feeding, Dressing Bathing Assistance: Limited assistance Feeding assistance: Limited assistance(Assistance with set-up) Dressing Assistance: Limited assistance     Functional Limitations Info  Sight, Hearing, Speech Sight Info: Impaired(Wears glasses) Hearing Info: Adequate Speech Info: Adequate    SPECIAL CARE FACTORS FREQUENCY  PT (By licensed PT), OT (By licensed OT)     PT Frequency: Evaluated 8/16 in hospital; PT at SNF Eval and Treat, a minimum of 5 days per week OT Frequency:  Evaluated 8/16 in Hospital; OT at SNF Eval and Treat, a minimum of 5 days per week            Contractures Contractures Info: Not present    Additional Factors Info  Code Status, Allergies,  Insulin Sliding Scale Code Status Info: Full Allergies Info: Band-aid Liquid Bandage, Dermatological Products, Misc,.Depacon, Valproic Acid, Dilaudid Hydromorphone Hcl, Divalproex Sodium, Zinc Acetate, Cephalexin, Hydromorphone Hcl, Levofloxacin, Metformin, Simvastatin, Sulfa Antibiotics, Sulfonamide, Derivatives, Valproic Acid   Insulin Sliding Scale Info: 0-5 Units daily at bedtime; 0-9 Units 3 times daily with meals       Current Medications (10/28/2018):  This is the current hospital active medication list Current Facility-Administered Medications  Medication Dose Route Frequency Provider Last Rate Last Dose  . 0.9 %  sodium chloride infusion  250 mL Intravenous PRN Vianne Bulls, MD 10 mL/hr at 10/23/18 2339 250 mL at 10/23/18 2339  . 0.9 %  sodium chloride infusion   Intravenous Continuous Chesley Mires, MD 10 mL/hr at 10/25/18 0747    . acetaminophen (TYLENOL) tablet 650 mg  650 mg Oral Q6H PRN Opyd, Ilene Qua, MD       Or  . acetaminophen (TYLENOL) suppository 650 mg  650 mg Rectal Q6H PRN Opyd, Ilene Qua, MD      . ALPRAZolam Duanne Moron) tablet 1 mg  1 mg Oral QID PRN Einar Grad, RPH   1 mg at 10/27/18 1419  . atorvastatin (LIPITOR) tablet 40 mg  40 mg Oral q1800 Vianne Bulls, MD   40 mg at 10/27/18 1653  . buPROPion (WELLBUTRIN XL) 24 hr tablet 300 mg  300 mg Oral Daily Opyd, Ilene Qua, MD   300 mg at 10/28/18 1884  . enoxaparin (LOVENOX) injection 40 mg  40 mg Subcutaneous Q24H Mikhail, [place], DO   40 mg at 10/27/18 1358  . gabapentin (NEURONTIN) capsule 200 mg  200 mg Oral TID Raiford Noble [place], DO   200 mg at 10/28/18 1660  . hydrALAZINE (APRESOLINE) injection 10 mg  10 mg Intravenous Q6H PRN Raiford Noble Latif, DO      . HYDROcodone-acetaminophen (NORCO/VICODIN) 5-325 MG per tablet 1-2 tablet  1-2 tablet Oral Q4H PRN Opyd, Ilene Qua, MD   2 tablet at 10/28/18 [phone]  . insulin aspart (novoLOG) injection 0-5 Units  0-5 Units Subcutaneous QHS Vianne Bulls, MD   2 Units  at 10/22/18 2117  . insulin aspart (novoLOG) injection 0-9 Units  0-9 Units Subcutaneous TID WC Opyd, Ilene Qua, MD   1 Units at 10/28/18 1210  . levothyroxine (SYNTHROID) tablet 50 mcg  50 mcg Oral Daily Opyd, Ilene Qua, MD   50 mcg at 10/28/18 0811  . metoprolol tartrate (LOPRESSOR) tablet 25 mg  25 mg Oral BID Vianne Bulls, MD   25 mg at 10/28/18 6010  . polyethylene glycol (MIRALAX / GLYCOLAX) packet 17 g  17 g Oral Daily PRN Opyd, Ilene Qua, MD   17 g at 10/27/18 1653  . sodium chloride flush (NS) 0.9 % injection 3 mL  3 mL Intravenous Q12H Opyd, Ilene Qua, MD   3 mL at 10/27/18 2111  . sodium chloride flush (NS) 0.9 % injection 3 mL  3 mL Intravenous Q12H Opyd, Ilene Qua, MD   3 mL at 10/28/18 0811  . sodium chloride flush (NS) 0.9 % injection 3 mL  3 mL Intravenous PRN Opyd, Ilene Qua, MD      . traZODone (DESYREL) tablet 50 mg  50  mg Oral QHS PRN Raiford Noble Latif, DO   50 mg at 10/27/18 2110     Discharge Medications: Please see discharge summary for a list of discharge medications.  Relevant Imaging Results:  Relevant Lab Results:   Additional Information ss#[id number]  Sable Feil, LCSW

## 2018-10-28 NOTE — TOC Progression Note (Addendum)
Transition of Care (TOC) - Progression Note  *10/28/18 - Patient will discharge to [facility] once auth received   Patient Details  Name: Carolyn [name] MRN: [id number] Date of Birth: 09-24-1940  Transition of Care [facility]) CM/SW Contact  Sharlet Salina Mila Homer, LCSW Phone Number: 10/28/2018, 4:17 PM  Clinical Narrative:  CSW talked with patient, her daughter Raynette [phone]) by phone and in patient's room and daughter's husband Jesus Genera [phone]) regarding SNF placement. Patient and family in agreement with ST rehab and Mr. Hassell Done requested that facility search be done in [place] and [place] counties. Patient reported that she has never been to rehab before and the process was explained and a SNF list from Medicare.gov provided.    During my initial room visit with patient, she provided extensive information regarding her family, which includes 5 children. She confirmed that Raynette and her husband could be contacted regarding facility placement. Her other children are: Noam Franzen - lives next door to her; Macy Mis in [place], [place] who has no contact with her or her siblings. Daughter Raynette and her husband Juanda Crumble live 3 houses down from patient and per patient, Juanda Crumble assists her in handling her affairs. Raynette assisted patient in selecting a facility for ST rehab and Juanda Crumble was informed via text by CSW.  Call made to [facility] after sending out via [place] [phone]) and they can take patient and will initiate insurance authorization.   Expected Discharge Plan: [place] Barriers to Discharge: Continued Medical Work up  Expected Discharge Plan and Services Expected Discharge Plan: [place] In-house Referral: NA Discharge Planning Services: CM Consult Post Acute Care Choice: [place] arrangements for the past 2 months: Single Family Home                                      Social Determinants of Health (SDOH) Interventions  No SDOH interventions needed at this time  Readmission Risk Interventions No flowsheet data found.

## 2018-10-28 NOTE — Discharge Summary (Signed)
Physician Discharge Summary  Carolyn [name] ZOX:[id number] DOB: 01-23-1941 DOA: 10/21/2018  PCP: Biagio Borg, MD  Admit date: 10/21/2018 Discharge date: 11/01/2018  Time spent: 45 minutes  Recommendations for Outpatient Follow-up:  Patient will be discharged to home with home health services.  Patient will need to follow up with primary care provider within one week of discharge.  Patient should continue medications as prescribed.  Patient should follow a heart healthy/carb modified diet.   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 Roselind Rily B Smithis a 78 y.o.femalewith medical history significant forinsulin-dependent diabetes mellitus, chronic diastolic CHF, hypothyroidism, tobacco abuse, chronic back pain, depression, and anxiety, now presenting to the emergency department with confusion and right flank pain after a fall the night before. Patient has given multiple conflicting accounts of her fall that seem to have occurred at some point the night of 10/20/2018. She eventually crawled to the phone and called her family on 10/21/2018 and she was brought into the ED. She has been newly confused with family, complaining of severe right flank pain which she has had in the past but seems to have worsened after the fall. She denies any fevers, chills, shortness of breath, or chest pain. She reports pain at the right flank and in her  back. The patient reports that she has been experiencing visual hallucinations, and sometimes is not sure if she is hallucinating or not.  Hospital Course:  Acute metabolic encephalopathy -possibly secondary to hypoglycemia and possible suspected bronchogenic carcinoma with recent fall -Encephalopathy has improved, patient currently alert and oriented -On admission, patient presented with confusion and visual hallucinations after falling the night prior to admission was unable to get up from the floor. -There was question of syncope -Patient was also noted to be hypoglycemic -CT head unremarkable; unable to obtain MRI due to [place] is not compatible -TSH and ammonia WNL, RPR nonreactive -Psychiatry consulted and appreciated for visual hallucinations-recommended increasing gabapentin to 200 mg 3 times daily, tapering alprazolam given risk of falls, trazodone 50 mg QHS  Fall with generalized weakness -PT/OT initially recommended SNF however patient has improved and now recommending home health -Social work and case management consulted -Will discharge patient home with home health services including physical and occupational therapy, nursing, nursing aide, social work  Hypoglycemia, Diabetes mellitus, type II -Hemoglobin A1c 6.2 -Upon admission, patient was noted to have hypoglycemia and was given D50.  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 -CTA chest reveals right hilar mass concerning for bronchogenic carcinoma -Patient family aware of this -Pulmonary consulted and appreciated, status post bronchoscopy, biopsy showing adenocarcinoma -Oncology consulted and appreciated, will have oncology navigator set patient up for tumor board and clinic visit in the next couple of weeks  Acute kidney injury -Creatinine peaked to  1.41 -Was given IV fluids -Creatinine improved 8.11  Chronic diastolic heart failure/NICM -Patient with AICD -Currently appears to be  euvolemic and compensated -Lasix was initially held given acute kidney injury  -Echocardiogram shows an EF of 50 to 07%, LV diastolic Doppler parameters were consistent with pseudonormalization -Monitored intake and output, daily weight  Essential hypertension -Continue metoprolol, hydralazine as needed  Hypothyroidism -Continue Synthroid  Depression/anxiety -Continue bupropion, Xanax -Dietary consulted and appreciated, recommended continue gabapentin with increasing the dose as well as trazodone at night for sleep  Prolonged QT interval -QTC 53m in the ED -Patient with ICD  Hypomagnesemia -magnesium 1.8 -continue to monitor and replace as needed  Leukocytosis -Improved, down to 9.6 today  Normocytic anemia -Anemia panel showed iron 55, ferritin 68, folate 10.7, vitamin B12 323 -Hemoglobin currently stable, 12  Obesity -BMI of 31.9 -Patient to follow-up with PCP to discuss lifestyle modifications  Consultants Pulmonology  Psychiatry   Procedures  Echocardiogram Bronchoscopy  Discharge Exam: Vitals:   11/01/18 0448 11/01/18 0843  BP: (!) 119/94 (!) 105/57  Pulse: 63 74  Resp: 17 18  Temp: 98.4 F (36.9 C) 98.3 F (36.8 C)  SpO2: 99% 97%     General: Well developed, well nourished, NAD, appears stated age  HEENT: NCAT, mucous membranes moist.  Cardiovascular: S1 S2 auscultated, RRR, no murmur  Respiratory: Clear to auscultation bilaterally  Abdomen: Soft, obese, nontender, nondistended, + bowel sounds  Extremities: warm dry without cyanosis clubbing or edema  Neuro: AAOx3, nonfocal  Psych: Appropriate mood and affect   Discharge Instructions Discharge Instructions    Discharge instructions   Complete by: As directed    Patient will be discharged to home with home health services.  Patient will  need to follow up with primary care provider within one week of discharge.  Patient should continue medications as prescribed.  Patient should follow a heart healthy/carb modified diet.   Discharge instructions   Complete by: As directed    Patient will be discharged to home with home health services.  Patient will need to follow up with primary care provider within one week of discharge.  Patient should continue medications as prescribed.  Patient should follow a heart healthy/carb modified diet.   Face-to-face encounter (required for Medicare/Medicaid patients)   Complete by: As directed    I MCristal Fordcertify that this patient is under my care and that I, or a nurse practitioner or physician's assistant working with me, had a face-to-face encounter that meets the physician face-to-face encounter requirements with this patient on 10/28/2018. The encounter with the patient was in whole, or in part for the following medical condition(s) which is the primary reason for home health care (List medical condition): Fall, deconditioning   The encounter with the patient was in whole, or in part, for the following medical condition, which is the primary reason for home health care: Fall, deconditioning   I certify that, based on my findings, the following services are medically necessary home health services: Physical therapy   Reason for Medically Necessary Home Health Services: Therapy- Therapeutic Exercises to Increase Strength and Endurance   My clinical findings support the need for the above services: Unable to leave home safely without assistance and/or assistive device   Further, I certify that my clinical findings support that this patient is homebound due to: Unable to leave home safely without assistance   Home Health   Complete by: As directed    To provide the following care/treatments:  PT OT RWildwoodwork       Allergies as of 11/01/2018  Reactions   Band-aid  Liquid Bandage [dermatological Products, Misc.] Hives, Itching   Depacon [valproic Acid] Other (See Comments)   JAUNDICE   Dilaudid [hydromorphone Hcl] Other (See Comments)   Jaundice   Divalproex Sodium Nausea And Vomiting, Other (See Comments)   Jaundice   Zinc Acetate Hives, Itching   Cephalexin Nausea And Vomiting   Hydromorphone Hcl Rash   Levofloxacin Nausea And Vomiting   Metformin Nausea And Vomiting   Simvastatin Other (See Comments)   Muscle soreness   Sulfa Antibiotics Nausea And Vomiting   Sulfonamide Derivatives Nausea And Vomiting   Valproic Acid Other (See Comments)   JAUNDICE      Medication List    STOP taking these medications   meloxicam 7.5 MG tablet Commonly known as: MOBIC   promethazine 25 MG suppository Commonly known as: Phenergan     TAKE these medications   ALPRAZolam 1 MG tablet Commonly known as: XANAX TAKE 1 TABLET BY MOUTH FOUR TIMES A DAY AS NEEDED What changed: See the new instructions.   aspirin 81 MG tablet Take 81 mg by mouth daily.   atorvastatin 40 MG tablet Commonly known as: LIPITOR TAKE 1 TABLET BY MOUTH EVERY DAY   blood glucose meter kit and supplies Kit Use to test blood sugar up to three times a day. DX E11.09   buPROPion 300 MG 24 hr tablet Commonly known as: WELLBUTRIN XL TAKE 1 TABLET BY MOUTH EVERY DAY   cetirizine 10 MG tablet Commonly known as: ZYRTEC Take 1 tablet (10 mg total) by mouth daily.   desoximetasone 0.25 % cream Commonly known as: TOPICORT Apply 1 application topically 2 (two) times daily. Apply to affected area as directed What changed: Another medication with the same name was removed. Continue taking this medication, and follow the directions you see here.   diclofenac sodium 1 % Gel Commonly known as: VOLTAREN APPLY 2 GRAMS TO AFFECTED AREA 4 TIMES A DAY What changed: See the new instructions.   furosemide 20 MG tablet Commonly known as: LASIX TAKE 1 TABLET BY MOUTH EVERY DAY     gabapentin 100 MG capsule Commonly known as: NEURONTIN TAKE 1 CAPSULE BY MOUTH THREE TIMES A DAY What changed: See the new instructions.   glucose blood test strip Commonly known as: Cool Blood Glucose Test Strips Use to test blood sugar up to three times a day. DX E11.09   Insulin Pen Needle 32G X 4 MM Misc Commonly known as: BD Pen Needle Nano U/F USE TO ADMINISTER INSULIN ONCE A DAY. DX E11.9   Januvia 100 MG tablet Generic drug: sitaGLIPtin TAKE 1 TABLET BY MOUTH EVERY DAY What changed: how much to take   Lancets Misc Use lancets to test blood sugar up to three times a day. DX E11.09   Lantus SoloStar 100 UNIT/ML Solostar Pen Generic drug: Insulin Glargine INJECT 37 UNITS INTO THE SKIN DAILY AT 10 PM. What changed: See the new instructions.   levothyroxine 50 MCG tablet Commonly known as: SYNTHROID TAKE 1 TABLET BY MOUTH EVERY DAY What changed: when to take this   lisinopril 10 MG tablet Commonly known as: ZESTRIL Take 10 mg by mouth 2 (two) times daily. What changed: Another medication with the same name was removed. Continue taking this medication, and follow the directions you see here.   metoprolol tartrate 25 MG tablet Commonly known as: LOPRESSOR TAKE 1 TABLET BY MOUTH TWICE A DAY   omeprazole 20 MG capsule Commonly known as: PRILOSEC TAKE 1  CAPSULE BY MOUTH TWICE A DAY What changed: when to take this   pioglitazone 45 MG tablet Commonly known as: ACTOS TAKE 1 TABLET BY MOUTH EVERY DAY   tiZANidine 2 MG tablet Commonly known as: ZANAFLEX TAKE 1 TABLET BY MOUTH EVERY 6 HOURS AS NEEDED FOR MUSCLE SPASMS What changed: See the new instructions.   traMADol 50 MG tablet Commonly known as: ULTRAM TAKE 1 TABLET BY MOUTH THREE TIMES A DAY AS NEEDED What changed: reasons to take this      Allergies  Allergen Reactions   Band-Aid Liquid Bandage [Dermatological Products, Misc.] Hives and Itching   Depacon [Valproic Acid] Other (See Comments)     JAUNDICE   Dilaudid [Hydromorphone Hcl] Other (See Comments)    Jaundice   Divalproex Sodium Nausea And Vomiting and Other (See Comments)    Jaundice   Zinc Acetate Hives and Itching   Cephalexin Nausea And Vomiting   Hydromorphone Hcl Rash   Levofloxacin Nausea And Vomiting   Metformin Nausea And Vomiting   Simvastatin Other (See Comments)    Muscle soreness    Sulfa Antibiotics Nausea And Vomiting   Sulfonamide Derivatives Nausea And Vomiting   Valproic Acid Other (See Comments)    JAUNDICE     Follow-up Information    Biagio Borg, MD. Schedule an appointment as soon as possible for a visit in 1 week(s).   Specialties: Internal Medicine, Radiology Why: Hospital follow up Contact information: [address] [place] [address] [phone]        Burnell Blanks, MD .   Specialty: Cardiology Contact information: [address] [place] [address] [phone]            The results of significant diagnostics from this hospitalization (including imaging, microbiology, ancillary and laboratory) are listed below for reference.    Significant Diagnostic Studies: Dg Ribs Unilateral W/chest Right  Result Date: 10/21/2018 CLINICAL DATA:  Right flank pain following a fall today. EXAM: RIGHT RIBS AND CHEST - 3+ VIEW COMPARISON:  Chest dated 11/08/2017. FINDINGS: Stable enlarged cardiac silhouette. Interval increased density in the right middle lobe with obscuration of a portion of the right heart border. Stable left subclavian pacer and AICD leads. Clear left lung. Normal vascularity. No pneumothorax. No rib fracture seen. Thoracic and cervical spine degenerative changes. IMPRESSION: 1. No rib fracture seen. 2. Interval right middle lobe atelectasis or pneumonia. 3. Stable cardiomegaly. Electronically Signed   By: Claudie Revering M.D.   On: 10/21/2018 20:09   Dg Thoracic Spine 2 View  Result Date: 10/21/2018 CLINICAL DATA:  Right  flank pain following a fall today. EXAM: THORACIC SPINE 2 VIEWS COMPARISON:  05/27/2017. FINDINGS: Degenerative spur formation throughout the lumbar and lower cervical spine without significant change. No fractures or subluxations are seen. IMPRESSION: No fracture or subluxation. Degenerative changes. Electronically Signed   By: Claudie Revering M.D.   On: 10/21/2018 20:13   Dg Lumbar Spine 2-3 Views  Result Date: 10/21/2018 CLINICAL DATA:  Right flank and right hip pain following a fall today. EXAM: LUMBAR SPINE - 2-3 VIEW COMPARISON:  05/27/2017. FINDINGS: Five non-rib-bearing lumbar vertebrae. Mild to moderate anterior and lateral spur formation at multiple levels of the lumbar and lower thoracic spine, without significant change. Stable facet degenerative changes throughout the lumbar spine with stable associated grade 1 anterolisthesis at the L4-5 level. No fractures, pars defects or acute subluxations. Atheromatous arterial calcifications without visible aneurysm. IMPRESSION: Stable degenerative changes. No  acute abnormality. Electronically Signed   By: Claudie Revering M.D.   On: 10/21/2018 20:11   Ct Head Wo Contrast  Result Date: 10/21/2018 CLINICAL DATA:  Head trauma, minor, GCS>=13, high clinical risk, initial exam; C-spine trauma, high clinical risk (NEXUS/CCR). Unwitnessed fall. EXAM: CT HEAD WITHOUT CONTRAST CT CERVICAL SPINE WITHOUT CONTRAST TECHNIQUE: Multidetector CT imaging of the head and cervical spine was performed following the standard protocol without intravenous contrast. Multiplanar CT image reconstructions of the cervical spine were also generated. COMPARISON:  None. FINDINGS: CT HEAD FINDINGS Brain: No intracranial hemorrhage, mass effect, or midline shift. Age related atrophy. Moderate chronic small vessel ischemia. Remote lacunar infarcts in the right basal ganglia and caudate. No hydrocephalus. The basilar cisterns are patent. No evidence of territorial infarct or acute ischemia. No  extra-axial or intracranial fluid collection. Vascular: Atherosclerosis of skullbase vasculature without hyperdense vessel or abnormal calcification. Skull: No fracture or focal lesion. Sinuses/Orbits: Paranasal sinuses and mastoid air cells are clear. The visualized orbits are unremarkable. Other: None. CT CERVICAL SPINE FINDINGS Alignment: Straightening of normal lordosis. No traumatic subluxation. Skull base and vertebrae: No acute fracture. Vertebral body heights are maintained. The dens and skull base are intact. Soft tissues and spinal canal: No prevertebral fluid or swelling. No visible canal hematoma. Disc levels: Diffuse disc space narrowing and endplate spurring. Multilevel facet hypertrophy. Upper chest: No acute findings. Other: Advanced carotid calcifications. Prior right thyroidectomy. IMPRESSION: 1. No acute intracranial abnormality. No skull fracture. 2. Age related atrophy, chronic small vessel ischemia and remote right basal ganglia and caudate lacunar infarcts. 3. Multilevel degenerative change in the cervical spine without acute fracture or subluxation. 4. Carotid and skullbase atherosclerosis. Electronically Signed   By: Keith Rake M.D.   On: 10/21/2018 22:50   Ct Angio Chest Pe W And/or Wo Contrast  Result Date: 10/22/2018 CLINICAL DATA:  Shortness of breath EXAM: CT ANGIOGRAPHY CHEST CT ABDOMEN AND PELVIS WITH CONTRAST TECHNIQUE: Multidetector CT imaging of the chest was performed using the standard protocol during bolus administration of intravenous contrast. Multiplanar CT image reconstructions and MIPs were obtained to evaluate the vascular anatomy. Multidetector CT imaging of the abdomen and pelvis was performed using the standard protocol during bolus administration of intravenous contrast. CONTRAST:  23m OMNIPAQUE IOHEXOL 350 MG/ML SOLN COMPARISON:  CT abdomen pelvis 12/04/2014 FINDINGS: CTA CHEST FINDINGS Cardiovascular: Contrast injection is sufficient to demonstrate  satisfactory opacification of the pulmonary arteries to the segmental level.There is a mass abutting the origin of the right middle lobar pulmonary artery, causing abrupt occlusion. There is no discrete pulmonary embolus. The left pulmonary artery tree is normal. Mediastinum/Nodes: No mediastinal, hilar or axillary lymphadenopathy. The visualized thyroid and thoracic esophageal course are unremarkable. Lungs/Pleura: Right hilar mass measures 3.3 x 3.5 cm. There are smaller nodules in the right upper lobe (series 6, image 42). There is a 4 mm subpleural nodule in the left lower lobe (image 102). Musculoskeletal: No chest wall abnormality. No acute or significant osseous findings. Review of the MIP images confirms the above findings. CT ABDOMEN and PELVIS FINDINGS Hepatobiliary: Normal hepatic contours and density. No visible biliary dilatation. Normal gallbladder. Pancreas: Normal contours without ductal dilatation. No peripancreatic fluid collection. Spleen: Normal. Adrenals/Urinary Tract: --Adrenal glands: Left adrenal nodule measures 16 mm with an attenuation value of 36 HU. --Right kidney/ureter: No hydronephrosis or perinephric stranding. No nephrolithiasis. No obstructing ureteral stones. --Left kidney/ureter: Status post left nephrectomy --Urinary bladder: Unremarkable. Stomach/Bowel: --Stomach/Duodenum: No hiatal hernia or other gastric abnormality. Normal duodenal  course and caliber. --Small bowel: No dilatation or inflammation. --Colon: No focal abnormality. --Appendix: Surgically absent. Vascular/Lymphatic: Atherosclerotic calcification is present within the non-aneurysmal abdominal aorta, without hemodynamically significant stenosis. No abdominal or pelvic lymphadenopathy. Reproductive: No free fluid in the pelvis. Musculoskeletal. No bony spinal canal stenosis or focal osseous abnormality. Other: None. IMPRESSION: 1. Right hilar mass causing occlusion of the right middle lobar pulmonary artery. This is  most consistent with bronchogenic carcinoma. 2. Otherwise patent pulmonary arteries. 3. Tiny nodules of the right lung apex could represent satellite nodules. 4.  Aortic atherosclerosis (ICD10-I70.0). 5. 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 Electronically Signed   By: Ulyses Jarred M.D.   On: 10/22/2018 00:35   Ct Cervical Spine Wo Contrast  Result Date: 10/21/2018 CLINICAL DATA:  Head trauma, minor, GCS>=13, high clinical risk, initial exam; C-spine trauma, high clinical risk (NEXUS/CCR). Unwitnessed fall. EXAM: CT HEAD WITHOUT CONTRAST CT CERVICAL SPINE WITHOUT CONTRAST TECHNIQUE: Multidetector CT imaging of the head and cervical spine was performed following the standard protocol without intravenous contrast. Multiplanar CT image reconstructions of the cervical spine were also generated. COMPARISON:  None. FINDINGS: CT HEAD FINDINGS Brain: No intracranial hemorrhage, mass effect, or midline shift. Age related atrophy. Moderate chronic small vessel ischemia. Remote lacunar infarcts in the right basal ganglia and caudate. No hydrocephalus. The basilar cisterns are patent. No evidence of territorial infarct or acute ischemia. No extra-axial or intracranial fluid collection. Vascular: Atherosclerosis of skullbase vasculature without hyperdense vessel or abnormal calcification. Skull: No fracture or focal lesion. Sinuses/Orbits: Paranasal sinuses and mastoid air cells are clear. The visualized orbits are unremarkable. Other: None. CT CERVICAL SPINE FINDINGS Alignment: Straightening of normal lordosis. No traumatic subluxation. Skull base and vertebrae: No acute fracture. Vertebral body heights are maintained. The dens and skull base are intact. Soft tissues and spinal canal: No prevertebral fluid or swelling. No visible canal hematoma. Disc levels: Diffuse disc space narrowing and endplate spurring. Multilevel facet hypertrophy. Upper chest: No acute findings. Other: Advanced carotid  calcifications. Prior right thyroidectomy. IMPRESSION: 1. No acute intracranial abnormality. No skull fracture. 2. Age related atrophy, chronic small vessel ischemia and remote right basal ganglia and caudate lacunar infarcts. 3. Multilevel degenerative change in the cervical spine without acute fracture or subluxation. 4. Carotid and skullbase atherosclerosis. Electronically Signed   By: Keith Rake M.D.   On: 10/21/2018 22:50   Ct Abdomen Pelvis W Contrast  Result Date: 10/22/2018 CLINICAL DATA:  Shortness of breath EXAM: CT ANGIOGRAPHY CHEST CT ABDOMEN AND PELVIS WITH CONTRAST TECHNIQUE: Multidetector CT imaging of the chest was performed using the standard protocol during bolus administration of intravenous contrast. Multiplanar CT image reconstructions and MIPs were obtained to evaluate the vascular anatomy. Multidetector CT imaging of the abdomen and pelvis was performed using the standard protocol during bolus administration of intravenous contrast. CONTRAST:  55m OMNIPAQUE IOHEXOL 350 MG/ML SOLN COMPARISON:  CT abdomen pelvis 12/04/2014 FINDINGS: CTA CHEST FINDINGS Cardiovascular: Contrast injection is sufficient to demonstrate satisfactory opacification of the pulmonary arteries to the segmental level.There is a mass abutting the origin of the right middle lobar pulmonary artery, causing abrupt occlusion. There is no discrete pulmonary embolus. The left pulmonary artery tree is normal. Mediastinum/Nodes: No mediastinal, hilar or axillary lymphadenopathy. The visualized thyroid and thoracic esophageal course are unremarkable. Lungs/Pleura: Right hilar mass measures 3.3 x 3.5 cm. There are smaller nodules in the right upper lobe (series 6, image 42). There is a 4 mm subpleural nodule in the left  lower lobe (image 102). Musculoskeletal: No chest wall abnormality. No acute or significant osseous findings. Review of the MIP images confirms the above findings. CT ABDOMEN and PELVIS FINDINGS  Hepatobiliary: Normal hepatic contours and density. No visible biliary dilatation. Normal gallbladder. Pancreas: Normal contours without ductal dilatation. No peripancreatic fluid collection. Spleen: Normal. Adrenals/Urinary Tract: --Adrenal glands: Left adrenal nodule measures 16 mm with an attenuation value of 36 HU. --Right kidney/ureter: No hydronephrosis or perinephric stranding. No nephrolithiasis. No obstructing ureteral stones. --Left kidney/ureter: Status post left nephrectomy --Urinary bladder: Unremarkable. Stomach/Bowel: --Stomach/Duodenum: No hiatal hernia or other gastric abnormality. Normal duodenal course and caliber. --Small bowel: No dilatation or inflammation. --Colon: No focal abnormality. --Appendix: Surgically absent. Vascular/Lymphatic: Atherosclerotic calcification is present within the non-aneurysmal abdominal aorta, without hemodynamically significant stenosis. No abdominal or pelvic lymphadenopathy. Reproductive: No free fluid in the pelvis. Musculoskeletal. No bony spinal canal stenosis or focal osseous abnormality. Other: None. IMPRESSION: 1. Right hilar mass causing occlusion of the right middle lobar pulmonary artery. This is most consistent with bronchogenic carcinoma. 2. Otherwise patent pulmonary arteries. 3. Tiny nodules of the right lung apex could represent satellite nodules. 4.  Aortic atherosclerosis (ICD10-I70.0). 5. 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 Electronically Signed   By: Ulyses Jarred M.D.   On: 10/22/2018 00:35   US Renal  Result Date: 10/23/2018 CLINICAL DATA:  Acute kidney injury.  LEFT nephrectomy. EXAM: RENAL / URINARY TRACT ULTRASOUND COMPLETE COMPARISON:  None. FINDINGS: Right Kidney: Renal measurements: 11.6 x 5 x 5.7 cm = volume: 151 mL . Echogenicity within normal limits. 1.3 cm cyst within the lower pole. No suspicious mass or hydronephrosis visualized. Left Kidney: Absent. Bladder: Appears normal for degree of bladder  distention. Distal RIGHT ureter shown to be patent at the level the bladder (RIGHT ureteral jet visualized). IMPRESSION: 1. Normal RIGHT kidney.  No hydronephrosis. 2. Status post LEFT nephrectomy. Electronically Signed   By: Franki Cabot M.D.   On: 10/23/2018 10:06   Dg Chest Port 1 View  Result Date: 10/26/2018 CLINICAL DATA:  Shortness of breath EXAM: PORTABLE CHEST 1 VIEW COMPARISON:  10/25/2018 FINDINGS: Left AICD remains in place, unchanged. Heart is mildly enlarged. Increasing opacity at the right lung base laterally. Left lung clear. No effusion or acute bony abnormality. IMPRESSION: Increasing peripheral opacity at the right lung base could reflect atelectasis or infiltrate. Electronically Signed   By: Rolm Baptise M.D.   On: 10/26/2018 09:13   Dg Chest Port 1 View  Result Date: 10/25/2018 CLINICAL DATA:  Status post bronchoscopy EXAM: PORTABLE CHEST 1 VIEW COMPARISON:  10/23/2018 FINDINGS: Cardiac shadow is stable. Defibrillator is again noted and stable. The lungs are well aerated bilaterally. The right perihilar mass is again identified and stable. No pneumothorax is noted following biopsy. No bony abnormality is seen. IMPRESSION: No post bronchoscopy pneumothorax is seen. Electronically Signed   By: Inez Catalina M.D.   On: 10/25/2018 09:04   Dg Chest Port 1 View  Result Date: 10/23/2018 CLINICAL DATA:  Shortness of breath. EXAM: PORTABLE CHEST 1 VIEW COMPARISON:  10/21/2018 FINDINGS: Lordotic positioning noted. Heart size is stable. Aortic atherosclerosis. Transvenous pacemaker remains in appropriate position. Right middle lobe collapse is again seen. Left lung is clear. No evidence of pleural effusion. IMPRESSION: Stable right middle lobe collapse.  No new findings. Electronically Signed   By: Marlaine Hind M.D.   On: 10/23/2018 04:48   Dg Hip Unilat With Pelvis 2-3 Views Right  Result Date: 10/21/2018 CLINICAL  DATA:  Right hip pain following a fall today. EXAM: DG HIP (WITH OR WITHOUT  PELVIS) 2-3V RIGHT COMPARISON:  Right femur dated 05/27/2017. FINDINGS: Normal appearing hips without fracture or dislocation. Lower lumbar spine degenerative changes. IMPRESSION: No fracture or dislocation. Electronically Signed   By: Claudie Revering M.D.   On: 10/21/2018 20:12   Dg C-arm Bronchoscopy  Result Date: 10/25/2018 C-ARM BRONCHOSCOPY: Fluoroscopy was utilized by the requesting physician.  No radiographic interpretation.    Microbiology: Recent Results (from the past 240 hour(s))  Novel Coronavirus, NAA (hospital order; send-out to ref lab)     Status: None   Collection Time: 10/30/18  2:14 PM   Specimen: Nasopharyngeal Swab; Respiratory  Result Value Ref Range Status   SARS-CoV-2, NAA NOT DETECTED NOT DETECTED Final    Comment: (NOTE) This test was developed and its performance characteristics determined by [employer]. This test has not been FDA cleared or approved. This test has been authorized by FDA under an Emergency Use Authorization (EUA). This test is only authorized for the duration of time the declaration that circumstances exist justifying the authorization of the emergency use of in vitro diagnostic tests for detection of SARS-CoV-2 virus and/or diagnosis of COVID-19 infection under section 564(b)(1) of the Act, 21 U.S.C. 629UTM-5(Y)(6), unless the authorization is terminated or revoked sooner. When diagnostic testing is negative, the possibility of a false negative result should be considered in the context of a patient's recent exposures and the presence of clinical signs and symptoms consistent with COVID-19. An individual without symptoms of COVID-19 and who is not shedding SARS-CoV-2 virus would expect to have a negative (not detected) result in this assay. Performed  At: [facility] [address] [place], [place] [id number] Rush Farmer MD LE:[id number]    [place]  Final    Comment: Performed at [facility], [place] [address] [place], [place] [address]     Labs: Basic Metabolic Panel: Recent Labs  Lab 10/26/18 0819 10/27/18 0417  NA 137 137  K 4.0 4.1  CL 104 105  CO2 23 24  GLUCOSE 207* 147*  BUN 12 14  CREATININE 0.87 0.81  CALCIUM 9.3 8.9  MG 1.8 1.8  PHOS 3.8  --    Liver Function Tests: Recent Labs  Lab 10/26/18 0819  AST 26  ALT 25  ALKPHOS 47  BILITOT 0.6  PROT 7.0  ALBUMIN 3.8   No results for input(s): LIPASE, AMYLASE in the last 168 hours. No results for input(s): AMMONIA in the last 168 hours. CBC: Recent Labs  Lab 10/26/18 0819 10/27/18 0417 10/28/18 0745  WBC 15.9* 9.6  --   NEUTROABS 10.7*  --   --   HGB 12.2 10.9* 12.0  HCT 37.0 34.2* 36.5  MCV 89.6 90.7  --   PLT 268 275  --    Cardiac Enzymes: No results for input(s): CKTOTAL, CKMB, CKMBINDEX, TROPONINI in the last 168 hours. BNP: BNP (last 3 results) Recent Labs    11/08/17 1438  BNP 250.3*    ProBNP (last 3 results) No results for input(s): PROBNP in the last 8760 hours.  CBG: Recent Labs  Lab 10/31/18 1135 10/31/18 1637 10/31/18 2122 11/01/18 0650 11/01/18 1132  GLUCAP 206* 169* 170* 175* 222*       Signed:  Quest Tavenner  [facility] 11/01/2018, 12:36 PM

## 2018-10-29 LAB — GLUCOSE, CAPILLARY
Glucose-Capillary: 187 mg/dL — ABNORMAL HIGH (ref 70–99)
Glucose-Capillary: 181 mg/dL — ABNORMAL HIGH (ref 70–99)
Glucose-Capillary: 115 mg/dL — ABNORMAL HIGH (ref 70–99)
Glucose-Capillary: 181 mg/dL — ABNORMAL HIGH (ref 70–99)

## 2018-10-29 NOTE — Plan of Care (Signed)
  Problem: Education: Goal: Knowledge of General Education information will improve Description: Including pain rating scale, medication(s)/side effects and non-pharmacologic comfort measures Outcome: Completed/Met   Problem: Clinical Measurements: Goal: Ability to maintain clinical measurements within normal limits will improve Outcome: Completed/Met Goal: Will remain free from infection Outcome: Completed/Met Goal: Diagnostic test results will improve Outcome: Completed/Met Goal: Respiratory complications will improve Outcome: Completed/Met Goal: Cardiovascular complication will be avoided Outcome: Completed/Met   Problem: Activity: Goal: Risk for activity intolerance will decrease Outcome: Completed/Met   Problem: Nutrition: Goal: Adequate nutrition will be maintained Outcome: Completed/Met   Problem: Coping: Goal: Level of anxiety will decrease Outcome: Completed/Met   Problem: Elimination: Goal: Will not experience complications related to urinary retention Outcome: Completed/Met   Problem: Pain Managment: Goal: General experience of comfort will improve Outcome: Completed/Met   Problem: Skin Integrity: Goal: Risk for impaired skin integrity will decrease Outcome: Completed/Met

## 2018-10-29 NOTE — Progress Notes (Signed)
PROGRESS NOTE    Carolyn [name]  GBT:[id number] DOB: May 17, 1940 DOA: 10/21/2018 PCP: Biagio Borg, MD   Brief Narrative:  HPI on 10/22/2018 by Dr. Mitzi Hansen Carolyn [name] is a 78 y.o. female with medical history significant for insulin-dependent diabetes mellitus, chronic diastolic CHF, hypothyroidism, tobacco abuse, chronic back pain, depression, and anxiety, now presenting to the emergency department with confusion and right flank pain after a fall the night before.  Patient has given multiple conflicting accounts of her fall that seem to have occurred at some point the night of 10/20/2018.  She eventually crawled to the phone and called her family on 10/21/2018 and she was brought into the ED.  She has been newly confused with family, complaining of severe right flank pain which she has had in the past but seems to have worsened after the fall.  She denies any fevers, chills, shortness of breath, or chest pain.  She reports pain at the right flank and in her back.  The patient reports that she has been experiencing visual hallucinations, and sometimes is not sure if she is hallucinating or not.  Interim history  Mental status is improved significantly psychiatry evaluated Dr. Mariea Clonts recommending starting trazodone 50 mg nightly for insomnia and increasing gabapentin from 100 mg 3 times daily to 200 mg 3 times daily for anxiety and recommending continuing Xanax 1 mg 4 times daily and recommending a gradual taper given risk for falls.  Patient has the capacity to consent for SNF placement and Dr. Mariea Clonts recommending having a social worker provide resources for outpatient psychiatrist agreed counseling.  Renal function had worsened and now she has an AKI but after IV fluid hydration there is improved.  PTOT evaluated and recommending a skilled nursing facility as she lacked awareness of safety yesterday but will have PT OT reevaluate again but she is agreeable to going to a short-term rehab facility.   Leukocytosis is trending down.  Pulmonary evaluated and they are recommending a bronchoscopy and biopsy and this was done this morning with biopsy results still pending. Unfortunately MRI not be done as AICD is not compatible.  Interim history Admitted for AMS, which has resolved. Patient with weakness, now pending SNF. Had bronchoscopy, biopsy showed squamous cell carcinoma. Assessment & Plan   Acute metabolic encephalopathy -possibly secondary to hypoglycemia and possible suspected bronchogenic carcinoma with recent fall -Encephalopathy has improved, patient currently alert and oriented -On admission, patient presented with confusion and visual hallucinations after falling the night prior to admission was unable to get up from the floor. -There was question of syncope -Patient was also noted to be hypoglycemic -CT head unremarkable; unable to obtain MRI due to [place] is not compatible -TSH and ammonia WNL, RPR nonreactive -Psychiatry consulted and appreciated for visual hallucinations-recommended increasing gabapentin to 200 mg 3 times daily, tapering alprazolam given risk of falls, trazodone 50 mg QHS  Fall with generalized weakness -PT/OT recommending SNF -Social work consulted  Hypoglycemia, Diabetes mellitus, type II -Hemoglobin A1c 6.2 -Upon admission, patient was noted to have hypoglycemia and was given D50.  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 -CTA chest reveals right hilar mass concerning for bronchogenic carcinoma -Patient family aware of this -Pulmonary consulted and appreciated, status post bronchoscopy, biopsy showing adenocarcinoma -Oncology consulted and appreciated, will have oncology navigator set patient  up for tumor board and clinic visit in the next couple of weeks  Acute kidney  injury -Creatinine peaked to 1.41 -Was given IV fluids -Creatinine current 0.81 -Continue to monitor BMP  Chronic diastolic heart failure/NICM -Patient with AICD -Currently appears to be euvolemic and compensated -Lasix was initially held given acute kidney injury  -Echocardiogram shows an EF of 50 to 07%, LV diastolic Doppler parameters were consistent with pseudonormalization -Monitor intake and output, daily weight  Essential hypertension -Continue metoprolol, hydralazine as needed  Hypothyroidism -Continue Synthroid  Depression/anxiety -Continue bupropion, Xanax -Dietary consulted and appreciated, recommended continue gabapentin with increasing the dose as well as trazodone at night for sleep  Prolonged QT interval -QTC 558ms in the E -Patient with ICD  Hypomagnesemia -magnesium 1.8 -continue to monitor and replace as needed  Leukocytosis -resolved -Continue to monitor CBC  Normocytic anemia -Anemia panel showed iron 55, ferritin 68, folate 10.7, vitamin B12 323 -Hemoglobin stable -Continue to monitor CBC  Obesity -BMI of 31.9 -Patient to follow-up with PCP to discuss lifestyle modifications  DVT Prophylaxis  SCDs, lovenox  Code Status: Full  Family Communication: None at bedside  Disposition Plan: Admitted. Pending SNF placement  Consultants Pulmonology  Psychiatry   Procedures  Echocardiogram Bronchoscopy  Antibiotics   Anti-infectives (From admission, onward)   Start     Dose/Rate Route Frequency Ordered Stop   10/21/18 2245  cefTRIAXone (ROCEPHIN) 1 g in sodium chloride 0.9 % 100 mL IVPB     1 g 200 mL/hr over 30 Minutes Intravenous  Once 10/21/18 2235 10/22/18 0005   10/21/18 2245  azithromycin (ZITHROMAX) 500 mg in sodium chloride 0.9 % 250 mL IVPB     500 mg 250 mL/hr over 60 Minutes Intravenous  Once 10/21/18 2235 10/22/18 0204      Subjective:   Carolyn [name] seen and examined today.  No complaints this morning.  Currently in the  bathroom.  Denies chest pain, shortness of breath.  Objective:   Vitals:   10/28/18 1657 10/28/18 2012 10/29/18 0502 10/29/18 0921  BP: (!) 107/54 (!) 154/75 135/72 (!) 143/68  Pulse: 62 64 66 (!) 58  Resp: 18 19 18 18   Temp: 97.7 F (36.5 C) 98.6 F (37 C) 98.6 F (37 C) 98.5 F (36.9 C)  TempSrc: Oral Oral Oral Oral  SpO2: 95% 99% 99% 97%  Weight:  74.4 kg    Height:        Intake/Output Summary (Last 24 hours) at 10/29/2018 1109 Last data filed at 10/29/2018 0600 Gross per 24 hour  Intake 120 ml  Output 50 ml  Net 70 ml   Filed Weights   10/27/18 0500 10/27/18 2008 10/28/18 2012  Weight: 74.3 kg 74.1 kg 74.4 kg   Exam  General: Well developed, well nourished, NAD, appears stated age  HEENT: NCAT,  mucous membranes moist.   Neuro: AAOx3, nonfocal  Psych: Appropriate mood and affect  Data Reviewed: I have personally reviewed following labs and imaging studies  CBC: Recent Labs  Lab 10/23/18 0329 10/24/18 0321 10/25/18 0643 10/26/18 0819 10/27/18 0417 10/28/18 0745  WBC 10.7* 9.0 9.4 15.9* 9.6  --   NEUTROABS 6.2 4.6 4.4 10.7*  --   --   HGB 10.9* 11.0* 11.8* 12.2 10.9* 12.0  HCT 33.6* 34.7* 36.5 37.0 34.2* 36.5  MCV 90.1 92.3 90.6 89.6 90.7  --   PLT 249 261 277 268 275  --    Basic Metabolic Panel: Recent Labs  Lab 10/23/18 0329 10/24/18  0321 10/25/18 0643 10/26/18 0819 10/27/18 0417  NA 134* 142 140 137 137  K 4.0 4.0 3.9 4.0 4.1  CL 99 111 107 104 105  CO2 25 23 24 23 24   GLUCOSE 137* 130* 147* 207* 147*  BUN 18 10 9 12 14   CREATININE 1.41* 0.92 0.84 0.87 0.81  CALCIUM 8.5* 8.3* 9.3 9.3 8.9  MG 2.1 1.8 2.3 1.8 1.8  PHOS 4.4 3.2 3.3 3.8  --    GFR: Estimated Creatinine Clearance: 53.3 mL/min (by C-G formula based on SCr of 0.81 mg/dL). Liver Function Tests: Recent Labs  Lab 10/23/18 0329 10/24/18 0321 10/25/18 0643 10/26/18 0819  AST 26 18 19 26   ALT 19 16 19 25   ALKPHOS 40 40 43 47  BILITOT 0.6 0.6 0.8 0.6  PROT 6.1* 5.8*  6.8 7.0  ALBUMIN 3.2* 3.0* 3.6 3.8   No results for input(s): LIPASE, AMYLASE in the last 168 hours. No results for input(s): AMMONIA in the last 168 hours. Coagulation Profile: No results for input(s): INR, PROTIME in the last 168 hours. Cardiac Enzymes: Recent Labs  Lab 10/23/18 1024  CKTOTAL 205  CKMB 3.9   BNP (last 3 results) No results for input(s): PROBNP in the last 8760 hours. HbA1C: No results for input(s): HGBA1C in the last 72 hours. CBG: Recent Labs  Lab 10/28/18 0631 10/28/18 1119 10/28/18 1656 10/28/18 2053 10/29/18 0652  GLUCAP 179* 150* 136* 187* 187*   Lipid Profile: No results for input(s): CHOL, HDL, LDLCALC, TRIG, CHOLHDL, LDLDIRECT in the last 72 hours. Thyroid Function Tests: No results for input(s): TSH, T4TOTAL, FREET4, T3FREE, THYROIDAB in the last 72 hours. Anemia Panel: No results for input(s): VITAMINB12, FOLATE, FERRITIN, TIBC, IRON, RETICCTPCT in the last 72 hours. Urine analysis:    Component Value Date/Time   COLORURINE YELLOW 10/22/2018 [place] 10/22/2018 0531   LABSPEC 1.017 10/22/2018 0531   PHURINE 7.0 10/22/2018 0531   GLUCOSEU 50 (A) 10/22/2018 0531   GLUCOSEU NEGATIVE 04/08/2018 1525   HGBUR NEGATIVE 10/22/2018 0531   BILIRUBINUR NEGATIVE 10/22/2018 0531   KETONESUR NEGATIVE 10/22/2018 0531   PROTEINUR NEGATIVE 10/22/2018 0531   UROBILINOGEN 0.2 04/08/2018 1525   NITRITE NEGATIVE 10/22/2018 0531   LEUKOCYTESUR TRACE (A) 10/22/2018 0531   Sepsis Labs: @LABRCNTIP (procalcitonin:4,lacticidven:4)  ) Recent Results (from the past 240 hour(s))  SARS Coronavirus 2 [facility] order, Performed in [place] hospital lab)     Status: None   Collection Time: 10/22/18  1:03 AM  Result Value Ref Range Status   SARS Coronavirus 2 NEGATIVE NEGATIVE Final    Comment: (NOTE) If result is NEGATIVE SARS-CoV-2 target nucleic acids are NOT DETECTED. The SARS-CoV-2 RNA is generally detectable in upper and lower   respiratory specimens during the acute phase of infection. The lowest  concentration of SARS-CoV-2 viral copies this assay can detect is 250  copies / mL. A negative result does not preclude SARS-CoV-2 infection  and should not be used as the sole basis for treatment or other  patient management decisions.  A negative result may occur with  improper specimen collection / handling, submission of specimen other  than nasopharyngeal swab, presence of viral mutation(s) within the  areas targeted by this assay, and inadequate number of viral copies  (<250 copies / mL). A negative result must be combined with clinical  observations, patient history, and epidemiological information. If result is POSITIVE SARS-CoV-2 target nucleic acids are DETECTED. The SARS-CoV-2 RNA is generally detectable in upper and  lower  respiratory specimens dur ing the acute phase of infection.  Positive  results are indicative of active infection with SARS-CoV-2.  Clinical  correlation with patient history and other diagnostic information is  necessary to determine patient infection status.  Positive results do  not rule out bacterial infection or co-infection with other viruses. If result is PRESUMPTIVE POSTIVE SARS-CoV-2 nucleic acids MAY BE PRESENT.   A presumptive positive result was obtained on the submitted specimen  and confirmed on repeat testing.  While 2019 novel coronavirus  (SARS-CoV-2) nucleic acids may be present in the submitted sample  additional confirmatory testing may be necessary for epidemiological  and / or clinical management purposes  to differentiate between  SARS-CoV-2 and other Sarbecovirus currently known to infect humans.  If clinically indicated additional testing with an alternate test  methodology [phone]) is advised. The SARS-CoV-2 RNA is generally  detectable in upper and lower respiratory sp ecimens during the acute  phase of infection. The expected result is Negative. Fact  Sheet for Patients:  StrictlyIdeas.no Fact Sheet for Healthcare Providers: BankingDealers.co.za This test is not yet approved or cleared by the [place] FDA and has been authorized for detection and/or diagnosis of SARS-CoV-2 by FDA under an Emergency Use Authorization (EUA).  This EUA will remain in effect (meaning this test can be used) for the duration of the COVID-19 declaration under Section 564(b)(1) of the Act, 21 U.S.C. section 360bbb-3(b)(1), unless the authorization is terminated or revoked sooner. Performed at [facility], [place] [address] [place], [place] [address]       Radiology Studies: No results found.   Scheduled Meds:  atorvastatin  40 mg Oral q1800   buPROPion  300 mg Oral Daily   enoxaparin (LOVENOX) injection  40 mg Subcutaneous Q24H   gabapentin  200 mg Oral TID   insulin aspart  0-5 Units Subcutaneous QHS   insulin aspart  0-9 Units Subcutaneous TID WC   levothyroxine  50 mcg Oral Daily   metoprolol tartrate  25 mg Oral BID   sodium chloride flush  3 mL Intravenous Q12H   sodium chloride flush  3 mL Intravenous Q12H   Continuous Infusions:  sodium chloride 250 mL (10/23/18 2339)   sodium chloride 10 mL/hr at 10/25/18 0747     LOS: 6 days   Time Spent in minutes   30 minutes  Josephus Harriger D.O. on 10/29/2018 at 11:09 AM  Between 7am to 7pm - Please see pager noted on [email or web address]  After 7pm go to [email or web address]  And look for the night coverage person covering for me after hours  [facility] Office  [phone]

## 2018-10-30 LAB — GLUCOSE, CAPILLARY
Glucose-Capillary: 207 mg/dL — ABNORMAL HIGH (ref 70–99)
Glucose-Capillary: 157 mg/dL — ABNORMAL HIGH (ref 70–99)
Glucose-Capillary: 187 mg/dL — ABNORMAL HIGH (ref 70–99)
Glucose-Capillary: 168 mg/dL — ABNORMAL HIGH (ref 70–99)

## 2018-10-30 MED ORDER — FLUTICASONE PROPIONATE 50 MCG/ACT NA SUSP
2.0000 | Freq: Every day | NASAL | Status: DC
  Administered 2018-10-30 – 2018-11-01 (×3): 2 via NASAL
  Filled 2018-10-30: qty 16

## 2018-10-30 NOTE — Progress Notes (Signed)
PROGRESS NOTE    Carolyn [name]  LOV:[id number] DOB: January 07, 1941 DOA: 10/21/2018 PCP: Biagio Borg, MD   Brief Narrative:  HPI on 10/22/2018 by Dr. Mitzi Hansen Carolyn [name] is a 78 y.o. female with medical history significant for insulin-dependent diabetes mellitus, chronic diastolic CHF, hypothyroidism, tobacco abuse, chronic back pain, depression, and anxiety, now presenting to the emergency department with confusion and right flank pain after a fall the night before.  Patient has given multiple conflicting accounts of her fall that seem to have occurred at some point the night of 10/20/2018.  She eventually crawled to the phone and called her family on 10/21/2018 and she was brought into the ED.  She has been newly confused with family, complaining of severe right flank pain which she has had in the past but seems to have worsened after the fall.  She denies any fevers, chills, shortness of breath, or chest pain.  She reports pain at the right flank and in her back.  The patient reports that she has been experiencing visual hallucinations, and sometimes is not sure if she is hallucinating or not.  Interim history  Mental status is improved significantly psychiatry evaluated Dr. Mariea Clonts recommending starting trazodone 50 mg nightly for insomnia and increasing gabapentin from 100 mg 3 times daily to 200 mg 3 times daily for anxiety and recommending continuing Xanax 1 mg 4 times daily and recommending a gradual taper given risk for falls.  Patient has the capacity to consent for SNF placement and Dr. Mariea Clonts recommending having a social worker provide resources for outpatient psychiatrist agreed counseling.  Renal function had worsened and now she has an AKI but after IV fluid hydration there is improved.  PTOT evaluated and recommending a skilled nursing facility as she lacked awareness of safety yesterday but will have PT OT reevaluate again but she is agreeable to going to a short-term rehab facility.   Leukocytosis is trending down.  Pulmonary evaluated and they are recommending a bronchoscopy and biopsy and this was done this morning with biopsy results still pending. Unfortunately MRI not be done as AICD is not compatible.  Interim history Admitted for AMS, which has resolved. Patient with weakness, now pending SNF. Had bronchoscopy, biopsy showed squamous cell carcinoma. Assessment & Plan   Acute metabolic encephalopathy -possibly secondary to hypoglycemia and possible suspected bronchogenic carcinoma with recent fall -Encephalopathy has improved, patient currently alert and oriented -On admission, patient presented with confusion and visual hallucinations after falling the night prior to admission was unable to get up from the floor. -There was question of syncope -Patient was also noted to be hypoglycemic -CT head unremarkable; unable to obtain MRI due to [place] is not compatible -TSH and ammonia WNL, RPR nonreactive -Psychiatry consulted and appreciated for visual hallucinations-recommended increasing gabapentin to 200 mg 3 times daily, tapering alprazolam given risk of falls, trazodone 50 mg QHS  Fall with generalized weakness -PT/OT recommending SNF -Social work consulted  Hypoglycemia, Diabetes mellitus, type II -Hemoglobin A1c 6.2 -Upon admission, patient was noted to have hypoglycemia and was given D50.  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 -CTA chest reveals right hilar mass concerning for bronchogenic carcinoma -Patient family aware of this -Pulmonary consulted and appreciated, status post bronchoscopy, biopsy showing adenocarcinoma -Oncology consulted and appreciated, will have oncology navigator set patient  up for tumor board and clinic visit in the next couple of weeks  Acute kidney  injury -Creatinine peaked to 1.41 -Was given IV fluids -Creatinine current 0.81 -Continue to monitor BMP  Chronic diastolic heart failure/NICM -Patient with AICD -Currently appears to be euvolemic and compensated -Lasix was initially held given acute kidney injury  -Echocardiogram shows an EF of 50 to 83%, LV diastolic Doppler parameters were consistent with pseudonormalization -Monitor intake and output, daily weight  Essential hypertension -Continue metoprolol, hydralazine as needed  Hypothyroidism -Continue Synthroid  Depression/anxiety -Continue bupropion, Xanax -Dietary consulted and appreciated, recommended continue gabapentin with increasing the dose as well as trazodone at night for sleep  Prolonged QT interval -QTC 518ms in the E -Patient with ICD  Hypomagnesemia -magnesium 1.8 -continue to monitor and replace as needed  Leukocytosis -resolved -Continue to monitor CBC  Normocytic anemia -Anemia panel showed iron 55, ferritin 68, folate 10.7, vitamin B12 323 -Hemoglobin stable -Continue to monitor CBC  Obesity -BMI of 31.9 -Patient to follow-up with PCP to discuss lifestyle modifications  DVT Prophylaxis  SCDs, lovenox  Code Status: Full  Family Communication: None at bedside  Disposition Plan: Admitted. Pending SNF placement. Have ordered repeat COVID test for SNF.  Consultants Pulmonology  Psychiatry   Procedures  Echocardiogram Bronchoscopy  Antibiotics   Anti-infectives (From admission, onward)   Start     Dose/Rate Route Frequency Ordered Stop   10/21/18 2245  cefTRIAXone (ROCEPHIN) 1 g in sodium chloride 0.9 % 100 mL IVPB     1 g 200 mL/hr over 30 Minutes Intravenous  Once 10/21/18 2235 10/22/18 0005   10/21/18 2245  azithromycin (ZITHROMAX) 500 mg in sodium chloride 0.9 % 250 mL IVPB     500 mg 250 mL/hr over 60 Minutes Intravenous  Once 10/21/18 2235 10/22/18 0204      Subjective:   Carolyn [name] seen and examined today.  Plaints  this morning.  Denies current chest pain, shortness breath, abdominal pain, nausea vomiting, diarrhea constipation, dizziness or headache.  States she feels lazy.  Objective:   Vitals:   10/29/18 1641 10/29/18 2035 10/30/18 0504 10/30/18 0904  BP: (!) 172/66 (!) 179/96 (!) 179/75 (!) 168/71  Pulse: 60 67 63 66  Resp: 18 18 18 18   Temp: 97.9 F (36.6 C) 98 F (36.7 C) 97.9 F (36.6 C) 98 F (36.7 C)  TempSrc: Oral Oral Oral Oral  SpO2: 97% 99% 95% 96%  Weight:  74.4 kg    Height:        Intake/Output Summary (Last 24 hours) at 10/30/2018 1126 Last data filed at 10/30/2018 0856 Gross per 24 hour  Intake 720 ml  Output 0 ml  Net 720 ml   Filed Weights   10/27/18 2008 10/28/18 2012 10/29/18 2035  Weight: 74.1 kg 74.4 kg 74.4 kg   Exam  General: Well developed, well nourished, NAD, appears stated age  HEENT: NCAT, mucous membranes moist.   Cardiovascular: S1 S2 auscultated, RRR  Respiratory: Clear to auscultation bilaterally with equal chest rise  Abdomen: Soft, obese, nontender, nondistended, + bowel sounds  Extremities: warm dry without cyanosis clubbing or edema  Neuro: AAOx3, nonfocal  Psych: Appropriate mood and affect, pleasant  Data Reviewed: I have personally reviewed following labs and imaging studies  CBC: Recent Labs  Lab 10/24/18 0321 10/25/18 0643 10/26/18 0819 10/27/18 0417 10/28/18 0745  WBC 9.0 9.4 15.9* 9.6  --   NEUTROABS 4.6 4.4 10.7*  --   --   HGB 11.0*  11.8* 12.2 10.9* 12.0  HCT 34.7* 36.5 37.0 34.2* 36.5  MCV 92.3 90.6 89.6 90.7  --   PLT 261 277 268 275  --    Basic Metabolic Panel: Recent Labs  Lab 10/24/18 0321 10/25/18 0643 10/26/18 0819 10/27/18 0417  NA 142 140 137 137  K 4.0 3.9 4.0 4.1  CL 111 107 104 105  CO2 23 24 23 24   GLUCOSE 130* 147* 207* 147*  BUN 10 9 12 14   CREATININE 0.92 0.84 0.87 0.81  CALCIUM 8.3* 9.3 9.3 8.9  MG 1.8 2.3 1.8 1.8  PHOS 3.2 3.3 3.8  --    GFR: Estimated Creatinine Clearance: 53.3  mL/min (by C-G formula based on SCr of 0.81 mg/dL). Liver Function Tests: Recent Labs  Lab 10/24/18 0321 10/25/18 0643 10/26/18 0819  AST 18 19 26   ALT 16 19 25   ALKPHOS 40 43 47  BILITOT 0.6 0.8 0.6  PROT 5.8* 6.8 7.0  ALBUMIN 3.0* 3.6 3.8   No results for input(s): LIPASE, AMYLASE in the last 168 hours. No results for input(s): AMMONIA in the last 168 hours. Coagulation Profile: No results for input(s): INR, PROTIME in the last 168 hours. Cardiac Enzymes: No results for input(s): CKTOTAL, CKMB, CKMBINDEX, TROPONINI in the last 168 hours. BNP (last 3 results) No results for input(s): PROBNP in the last 8760 hours. HbA1C: No results for input(s): HGBA1C in the last 72 hours. CBG: Recent Labs  Lab 10/29/18 0652 10/29/18 1126 10/29/18 1640 10/29/18 2033 10/30/18 0654  GLUCAP 187* 181* 115* 181* 207*   Lipid Profile: No results for input(s): CHOL, HDL, LDLCALC, TRIG, CHOLHDL, LDLDIRECT in the last 72 hours. Thyroid Function Tests: No results for input(s): TSH, T4TOTAL, FREET4, T3FREE, THYROIDAB in the last 72 hours. Anemia Panel: No results for input(s): VITAMINB12, FOLATE, FERRITIN, TIBC, IRON, RETICCTPCT in the last 72 hours. Urine analysis:    Component Value Date/Time   COLORURINE YELLOW 10/22/2018 [place] 10/22/2018 0531   LABSPEC 1.017 10/22/2018 0531   PHURINE 7.0 10/22/2018 0531   GLUCOSEU 50 (A) 10/22/2018 0531   GLUCOSEU NEGATIVE 04/08/2018 1525   HGBUR NEGATIVE 10/22/2018 0531   BILIRUBINUR NEGATIVE 10/22/2018 0531   KETONESUR NEGATIVE 10/22/2018 0531   PROTEINUR NEGATIVE 10/22/2018 0531   UROBILINOGEN 0.2 04/08/2018 1525   NITRITE NEGATIVE 10/22/2018 0531   LEUKOCYTESUR TRACE (A) 10/22/2018 0531   Sepsis Labs: @LABRCNTIP (procalcitonin:4,lacticidven:4)  ) Recent Results (from the past 240 hour(s))  SARS Coronavirus 2 [facility] order, Performed in [place] hospital lab)     Status: None   Collection Time: 10/22/18  1:03 AM    Result Value Ref Range Status   SARS Coronavirus 2 NEGATIVE NEGATIVE Final    Comment: (NOTE) If result is NEGATIVE SARS-CoV-2 target nucleic acids are NOT DETECTED. The SARS-CoV-2 RNA is generally detectable in upper and lower  respiratory specimens during the acute phase of infection. The lowest  concentration of SARS-CoV-2 viral copies this assay can detect is 250  copies / mL. A negative result does not preclude SARS-CoV-2 infection  and should not be used as the sole basis for treatment or other  patient management decisions.  A negative result may occur with  improper specimen collection / handling, submission of specimen other  than nasopharyngeal swab, presence of viral mutation(s) within the  areas targeted by this assay, and inadequate number of viral copies  (<250 copies / mL). A negative result must be combined with clinical  observations, patient history, and  epidemiological information. If result is POSITIVE SARS-CoV-2 target nucleic acids are DETECTED. The SARS-CoV-2 RNA is generally detectable in upper and lower  respiratory specimens dur ing the acute phase of infection.  Positive  results are indicative of active infection with SARS-CoV-2.  Clinical  correlation with patient history and other diagnostic information is  necessary to determine patient infection status.  Positive results do  not rule out bacterial infection or co-infection with other viruses. If result is PRESUMPTIVE POSTIVE SARS-CoV-2 nucleic acids MAY BE PRESENT.   A presumptive positive result was obtained on the submitted specimen  and confirmed on repeat testing.  While 2019 novel coronavirus  (SARS-CoV-2) nucleic acids may be present in the submitted sample  additional confirmatory testing may be necessary for epidemiological  and / or clinical management purposes  to differentiate between  SARS-CoV-2 and other Sarbecovirus currently known to infect humans.  If clinically indicated additional  testing with an alternate test  methodology [phone]) is advised. The SARS-CoV-2 RNA is generally  detectable in upper and lower respiratory sp ecimens during the acute  phase of infection. The expected result is Negative. Fact Sheet for Patients:  StrictlyIdeas.no Fact Sheet for Healthcare Providers: BankingDealers.co.za This test is not yet approved or cleared by the [place] FDA and has been authorized for detection and/or diagnosis of SARS-CoV-2 by FDA under an Emergency Use Authorization (EUA).  This EUA will remain in effect (meaning this test can be used) for the duration of the COVID-19 declaration under Section 564(b)(1) of the Act, 21 U.S.C. section 360bbb-3(b)(1), unless the authorization is terminated or revoked sooner. Performed at [facility], [place] [address], [place], [place] [address]       Radiology Studies: No results found.   Scheduled Meds:  atorvastatin  40 mg Oral q1800   buPROPion  300 mg Oral Daily   enoxaparin (LOVENOX) injection  40 mg Subcutaneous Q24H   fluticasone  2 spray Each Nare Daily   gabapentin  200 mg Oral TID   insulin aspart  0-5 Units Subcutaneous QHS   insulin aspart  0-9 Units Subcutaneous TID WC   levothyroxine  50 mcg Oral Daily   metoprolol tartrate  25 mg Oral BID   sodium chloride flush  3 mL Intravenous Q12H   sodium chloride flush  3 mL Intravenous Q12H   Continuous Infusions:  sodium chloride 250 mL (10/23/18 2339)   sodium chloride 10 mL/hr at 10/25/18 0747     LOS: 7 days   Time Spent in minutes   30 minutes  Teresia Myint D.O. on 10/30/2018 at 11:26 AM  Between 7am to 7pm - Please see pager noted on [email or web address]  After 7pm go to [email or web address]  And look for the night coverage person covering for me after hours  [facility] Office  [phone]

## 2018-10-30 NOTE — Plan of Care (Signed)
  Problem: Safety: Goal: Ability to remain free from injury will improve Outcome: Progressing   Problem: Health Behavior/Discharge Planning: Goal: Ability to manage health-related needs will improve Outcome: Completed/Met   Problem: Elimination: Goal: Will not experience complications related to bowel motility Outcome: Completed/Met

## 2018-10-31 LAB — GLUCOSE, CAPILLARY
Glucose-Capillary: 233 mg/dL — ABNORMAL HIGH (ref 70–99)
Glucose-Capillary: 206 mg/dL — ABNORMAL HIGH (ref 70–99)
Glucose-Capillary: 169 mg/dL — ABNORMAL HIGH (ref 70–99)
Glucose-Capillary: 170 mg/dL — ABNORMAL HIGH (ref 70–99)

## 2018-10-31 LAB — NOVEL CORONAVIRUS, NAA (HOSP ORDER, SEND-OUT TO REF LAB; TAT 18-24 HRS): SARS-CoV-2, NAA: NOT DETECTED

## 2018-10-31 NOTE — Clinical Social Work Note (Signed)
3:24 pm - Received call from [place], admissions director at [facility]. CSW advised that Shirlean Mylar, with [facility] requesting more clinicals (H&P, PT/OT notes, progress notes), in addition to some that were requested and already sent to her by facility. These items transmitted to Shirlean Mylar (fax [phone]) by CSW. Will continue to follow and provide SW intervention services as needed through discharge.  Shanoah Asbill Givens, MSW, LCSW Licensed Clinical Social Worker [place] [phone]

## 2018-10-31 NOTE — Progress Notes (Signed)
PROGRESS NOTE    Carolyn [name]  PYP:[id number] DOB: 1941-02-01 DOA: 10/21/2018 PCP: Biagio Borg, MD   Brief Narrative:  HPI on 10/22/2018 by Dr. Mitzi Hansen Carolyn [name] is a 78 y.o. female with medical history significant for insulin-dependent diabetes mellitus, chronic diastolic CHF, hypothyroidism, tobacco abuse, chronic back pain, depression, and anxiety, now presenting to the emergency department with confusion and right flank pain after a fall the night before.  Patient has given multiple conflicting accounts of her fall that seem to have occurred at some point the night of 10/20/2018.  She eventually crawled to the phone and called her family on 10/21/2018 and she was brought into the ED.  She has been newly confused with family, complaining of severe right flank pain which she has had in the past but seems to have worsened after the fall.  She denies any fevers, chills, shortness of breath, or chest pain.  She reports pain at the right flank and in her back.  The patient reports that she has been experiencing visual hallucinations, and sometimes is not sure if she is hallucinating or not.  Interim history  Mental status is improved significantly psychiatry evaluated Dr. Mariea Clonts recommending starting trazodone 50 mg nightly for insomnia and increasing gabapentin from 100 mg 3 times daily to 200 mg 3 times daily for anxiety and recommending continuing Xanax 1 mg 4 times daily and recommending a gradual taper given risk for falls.  Patient has the capacity to consent for SNF placement and Dr. Mariea Clonts recommending having a social worker provide resources for outpatient psychiatrist agreed counseling.  Renal function had worsened and now she has an AKI but after IV fluid hydration there is improved.  PTOT evaluated and recommending a skilled nursing facility as she lacked awareness of safety yesterday but will have PT OT reevaluate again but she is agreeable to going to a short-term rehab facility.   Leukocytosis is trending down.  Pulmonary evaluated and they are recommending a bronchoscopy and biopsy and this was done this morning with biopsy results still pending. Unfortunately MRI not be done as AICD is not compatible.  Interim history Admitted for AMS, which has resolved. Patient with weakness, now pending SNF. Had bronchoscopy, biopsy showed squamous cell carcinoma. Assessment & Plan   Acute metabolic encephalopathy -possibly secondary to hypoglycemia and possible suspected bronchogenic carcinoma with recent fall -Encephalopathy has improved, patient currently alert and oriented -On admission, patient presented with confusion and visual hallucinations after falling the night prior to admission was unable to get up from the floor. -There was question of syncope -Patient was also noted to be hypoglycemic -CT head unremarkable; unable to obtain MRI due to [place] is not compatible -TSH and ammonia WNL, RPR nonreactive -Psychiatry consulted and appreciated for visual hallucinations-recommended increasing gabapentin to 200 mg 3 times daily, tapering alprazolam given risk of falls, trazodone 50 mg QHS  Fall with generalized weakness -PT/OT recommending SNF -Social work consulted  Hypoglycemia, Diabetes mellitus, type II -Hemoglobin A1c 6.2 -Upon admission, patient was noted to have hypoglycemia and was given D50.  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 -CTA chest reveals right hilar mass concerning for bronchogenic carcinoma -Patient family aware of this -Pulmonary consulted and appreciated, status post bronchoscopy, biopsy showing adenocarcinoma -Oncology consulted and appreciated, will have oncology navigator set patient  up for tumor board and clinic visit in the next couple of weeks  Acute kidney  injury -Creatinine peaked to 1.41 -Was given IV fluids -Creatinine down to 0.81 -Continue to monitor BMP  Chronic diastolic heart failure/NICM -Patient with AICD -Currently appears to be euvolemic and compensated -Lasix was initially held given acute kidney injury  -Echocardiogram shows an EF of 50 to 19%, LV diastolic Doppler parameters were consistent with pseudonormalization -Monitor intake and output, daily weight  Essential hypertension -Continue metoprolol, hydralazine as needed  Hypothyroidism -Continue Synthroid  Depression/anxiety -Continue bupropion, Xanax -Dietary consulted and appreciated, recommended continue gabapentin with increasing the dose as well as trazodone at night for sleep  Prolonged QT interval -QTC 577ms in the E -Patient with ICD  Hypomagnesemia -magnesium 1.8 -continue to monitor and replace as needed  Leukocytosis -resolved -Continue to monitor CBC  Normocytic anemia -Anemia panel showed iron 55, ferritin 68, folate 10.7, vitamin B12 323 -Hemoglobin stable -Continue to monitor CBC  Obesity -BMI of 31.9 -Patient to follow-up with PCP to discuss lifestyle modifications  DVT Prophylaxis  SCDs, lovenox  Code Status: Full  Family Communication: None at bedside. Discussed with daughter- answered all questions. States they cannot provide 24 hour supervision.   Disposition Plan: Admitted. Pending SNF placement. Have ordered repeat COVID test for SNF.   Consultants Pulmonology  Psychiatry   Procedures  Echocardiogram Bronchoscopy  Antibiotics   Anti-infectives (From admission, onward)   Start     Dose/Rate Route Frequency Ordered Stop   10/21/18 2245  cefTRIAXone (ROCEPHIN) 1 g in sodium chloride 0.9 % 100 mL IVPB     1 g 200 mL/hr over 30 Minutes Intravenous  Once 10/21/18 2235 10/22/18 0005   10/21/18 2245  azithromycin (ZITHROMAX) 500 mg in sodium chloride 0.9 % 250 mL IVPB     500 mg 250 mL/hr over 60 Minutes Intravenous   Once 10/21/18 2235 10/22/18 0204      Subjective:   Carolyn [name] seen and examined today.  Patient with no complaints this morning.  Denies chest pain, shortness breath, abdominal pain, nausea or vomiting, diarrhea constipation, dizziness or headache.  Ready to get out of the hospital move around.  Objective:   Vitals:   10/30/18 1633 10/30/18 1957 10/31/18 0434 10/31/18 1000  BP: (!) 154/60 98/85 (!) 170/90 132/62  Pulse: 61 74 66 68  Resp: 18 18 18 18   Temp: 98.8 F (37.1 C) 97.8 F (36.6 C) 98.2 F (36.8 C) 98.3 F (36.8 C)  TempSrc: Oral Oral Oral Oral  SpO2: 95% 97% 95% 96%  Weight:      Height:        Intake/Output Summary (Last 24 hours) at 10/31/2018 1232 Last data filed at 10/31/2018 1002 Gross per 24 hour  Intake 480 ml  Output 0 ml  Net 480 ml   Filed Weights   10/27/18 2008 10/28/18 2012 10/29/18 2035  Weight: 74.1 kg 74.4 kg 74.4 kg   Exam  General: Well developed, well nourished, NAD, appears stated age  22: NCAT, mucous membranes moist.   Cardiovascular: S1 S2 auscultated, RRR  Respiratory: Clear to auscultation bilaterally   Abdomen: Soft, obese, nontender, nondistended, + bowel sounds  Extremities: warm dry without cyanosis clubbing or edema  Neuro: AAOx3, nonfocal  Psych: Pleasant, appropriate mood and affect  Data Reviewed: I have personally reviewed following labs and imaging studies  CBC: Recent Labs  Lab 10/25/18 0643 10/26/18 0819 10/27/18 0417 10/28/18 0745  WBC 9.4 15.9* 9.6  --  NEUTROABS 4.4 10.7*  --   --   HGB 11.8* 12.2 10.9* 12.0  HCT 36.5 37.0 34.2* 36.5  MCV 90.6 89.6 90.7  --   PLT 277 268 275  --    Basic Metabolic Panel: Recent Labs  Lab 10/25/18 0643 10/26/18 0819 10/27/18 0417  NA 140 137 137  K 3.9 4.0 4.1  CL 107 104 105  CO2 24 23 24   GLUCOSE 147* 207* 147*  BUN 9 12 14   CREATININE 0.84 0.87 0.81  CALCIUM 9.3 9.3 8.9  MG 2.3 1.8 1.8  PHOS 3.3 3.8  --    GFR: Estimated Creatinine  Clearance: 53.3 mL/min (by C-G formula based on SCr of 0.81 mg/dL). Liver Function Tests: Recent Labs  Lab 10/25/18 0643 10/26/18 0819  AST 19 26  ALT 19 25  ALKPHOS 43 47  BILITOT 0.8 0.6  PROT 6.8 7.0  ALBUMIN 3.6 3.8   No results for input(s): LIPASE, AMYLASE in the last 168 hours. No results for input(s): AMMONIA in the last 168 hours. Coagulation Profile: No results for input(s): INR, PROTIME in the last 168 hours. Cardiac Enzymes: No results for input(s): CKTOTAL, CKMB, CKMBINDEX, TROPONINI in the last 168 hours. BNP (last 3 results) No results for input(s): PROBNP in the last 8760 hours. HbA1C: No results for input(s): HGBA1C in the last 72 hours. CBG: Recent Labs  Lab 10/30/18 1212 10/30/18 1632 10/30/18 2111 10/31/18 0714 10/31/18 1135  GLUCAP 157* 187* 168* 233* 206*   Lipid Profile: No results for input(s): CHOL, HDL, LDLCALC, TRIG, CHOLHDL, LDLDIRECT in the last 72 hours. Thyroid Function Tests: No results for input(s): TSH, T4TOTAL, FREET4, T3FREE, THYROIDAB in the last 72 hours. Anemia Panel: No results for input(s): VITAMINB12, FOLATE, FERRITIN, TIBC, IRON, RETICCTPCT in the last 72 hours. Urine analysis:    Component Value Date/Time   COLORURINE YELLOW 10/22/2018 [place] 10/22/2018 0531   LABSPEC 1.017 10/22/2018 0531   PHURINE 7.0 10/22/2018 0531   GLUCOSEU 50 (A) 10/22/2018 0531   GLUCOSEU NEGATIVE 04/08/2018 1525   HGBUR NEGATIVE 10/22/2018 0531   BILIRUBINUR NEGATIVE 10/22/2018 0531   KETONESUR NEGATIVE 10/22/2018 0531   PROTEINUR NEGATIVE 10/22/2018 0531   UROBILINOGEN 0.2 04/08/2018 1525   NITRITE NEGATIVE 10/22/2018 0531   LEUKOCYTESUR TRACE (A) 10/22/2018 0531   Sepsis Labs: @LABRCNTIP (procalcitonin:4,lacticidven:4)  ) Recent Results (from the past 240 hour(s))  SARS Coronavirus 2 [facility] order, Performed in [place] hospital lab)     Status: None   Collection Time: 10/22/18  1:03 AM  Result Value Ref Range  Status   SARS Coronavirus 2 NEGATIVE NEGATIVE Final    Comment: (NOTE) If result is NEGATIVE SARS-CoV-2 target nucleic acids are NOT DETECTED. The SARS-CoV-2 RNA is generally detectable in upper and lower  respiratory specimens during the acute phase of infection. The lowest  concentration of SARS-CoV-2 viral copies this assay can detect is 250  copies / mL. A negative result does not preclude SARS-CoV-2 infection  and should not be used as the sole basis for treatment or other  patient management decisions.  A negative result may occur with  improper specimen collection / handling, submission of specimen other  than nasopharyngeal swab, presence of viral mutation(s) within the  areas targeted by this assay, and inadequate number of viral copies  (<250 copies / mL). A negative result must be combined with clinical  observations, patient history, and epidemiological information. If result is POSITIVE SARS-CoV-2 target nucleic acids are DETECTED. The  SARS-CoV-2 RNA is generally detectable in upper and lower  respiratory specimens dur ing the acute phase of infection.  Positive  results are indicative of active infection with SARS-CoV-2.  Clinical  correlation with patient history and other diagnostic information is  necessary to determine patient infection status.  Positive results do  not rule out bacterial infection or co-infection with other viruses. If result is PRESUMPTIVE POSTIVE SARS-CoV-2 nucleic acids MAY BE PRESENT.   A presumptive positive result was obtained on the submitted specimen  and confirmed on repeat testing.  While 2019 novel coronavirus  (SARS-CoV-2) nucleic acids may be present in the submitted sample  additional confirmatory testing may be necessary for epidemiological  and / or clinical management purposes  to differentiate between  SARS-CoV-2 and other Sarbecovirus currently known to infect humans.  If clinically indicated additional testing with an alternate  test  methodology [phone]) is advised. The SARS-CoV-2 RNA is generally  detectable in upper and lower respiratory sp ecimens during the acute  phase of infection. The expected result is Negative. Fact Sheet for Patients:  StrictlyIdeas.no Fact Sheet for Healthcare Providers: BankingDealers.co.za This test is not yet approved or cleared by the [place] FDA and has been authorized for detection and/or diagnosis of SARS-CoV-2 by FDA under an Emergency Use Authorization (EUA).  This EUA will remain in effect (meaning this test can be used) for the duration of the COVID-19 declaration under Section 564(b)(1) of the Act, 21 U.S.C. section 360bbb-3(b)(1), unless the authorization is terminated or revoked sooner. Performed at [facility], [place] [address], [place], [place] [address]       Radiology Studies: No results found.   Scheduled Meds:  atorvastatin  40 mg Oral q1800   buPROPion  300 mg Oral Daily   enoxaparin (LOVENOX) injection  40 mg Subcutaneous Q24H   fluticasone  2 spray Each Nare Daily   gabapentin  200 mg Oral TID   insulin aspart  0-5 Units Subcutaneous QHS   insulin aspart  0-9 Units Subcutaneous TID WC   levothyroxine  50 mcg Oral Daily   metoprolol tartrate  25 mg Oral BID   sodium chloride flush  3 mL Intravenous Q12H   sodium chloride flush  3 mL Intravenous Q12H   Continuous Infusions:  sodium chloride 250 mL (10/23/18 2339)   sodium chloride 10 mL/hr at 10/25/18 0747     LOS: 8 days   Time Spent in minutes   30 minutes  Tennille Montelongo D.O. on 10/31/2018 at 12:32 PM  Between 7am to 7pm - Please see pager noted on [email or web address]  After 7pm go to [email or web address]  And look for the night coverage person covering for me after hours  [facility] Office  [phone]

## 2018-10-31 NOTE — Progress Notes (Signed)
Inpatient Diabetes Program Recommendations  AACE/ADA: New Consensus Statement on Inpatient Glycemic Control (2015)  Target Ranges:  Prepandial:   less than 140 mg/dL      Peak postprandial:   less than 180 mg/dL (1-2 hours)      Critically ill patients:  140 - 180 mg/dL   Results for Carolyn [name], Carolyn [name] (MRN [id number]) as of 10/31/2018 14:53  Ref. Range 10/30/2018 06:54 10/30/2018 12:12 10/30/2018 16:32 10/30/2018 21:11  Glucose-Capillary Latest Ref Range: 70 - 99 mg/dL 207 (H)  3 units NOVOLOG  157 (H)  2 units NOVOLOG  187 (H)  2 units NOVOLOG  168 (H)   Results for Carolyn [name], Carolyn [name] (MRN [id number]) as of 10/31/2018 14:53  Ref. Range 10/31/2018 07:14 10/31/2018 11:35  Glucose-Capillary Latest Ref Range: 70 - 99 mg/dL 233 (H)  3 units NOVOLOG  206 (H)  3 units NOVOLOG      Home DM Meds: Lantus 36-38 units QAM       Januvia 100 mg Daily       Actos 45 mg Daily  Current Orders: Novolog Sensitive Correction Scale/ SSI (0-9 units) TID AC + HS      MD- Note that CBGs elevated.  Takes Lantus at home.  Please consider staring 25% total home dose:  Lantus 10 units QHS    --Will follow patient during hospitalization--  Wyn Quaker RN, MSN, CDE Diabetes Coordinator Inpatient Glycemic Control Team Team Pager: [phone] (8a-5p)

## 2018-10-31 NOTE — Care Management Important Message (Signed)
Important Message  Patient Details  Name: Carolyn [name] MRN: [id number] Date of Birth: [birth year]   Medicare Important Message Given:  Yes     Orbie Pyo 10/31/2018, 3:39 PM

## 2018-11-01 ENCOUNTER — Telehealth: Payer: Self-pay | Admitting: *Deleted

## 2018-11-01 DIAGNOSIS — C3491 Malignant neoplasm of unspecified part of right bronchus or lung: Secondary | ICD-10-CM

## 2018-11-01 LAB — GLUCOSE, CAPILLARY
Glucose-Capillary: 175 mg/dL — ABNORMAL HIGH (ref 70–99)
Glucose-Capillary: 222 mg/dL — ABNORMAL HIGH (ref 70–99)
Glucose-Capillary: 59 mg/dL — ABNORMAL LOW (ref 70–99)
Glucose-Capillary: 106 mg/dL — ABNORMAL HIGH (ref 70–99)

## 2018-11-01 MED ORDER — INSULIN GLARGINE 100 UNIT/ML ~~LOC~~ SOLN
10.0000 [IU] | Freq: Every day | SUBCUTANEOUS | Status: DC
  Administered 2018-11-01: 10 [IU] via SUBCUTANEOUS
  Filled 2018-11-01: qty 0.1

## 2018-11-01 NOTE — TOC Transition Note (Addendum)
Transition of Care [facility]) - CM/SW Discharge Note   Patient Details  Name: Carolyn [name] MRN: [id number] Date of Birth: Oct 21, 1940  Transition of [facility]) CM/SW Contact:  Bartholomew Crews, RN Phone Number: [phone] 11/01/2018, 1:46 PM   Clinical Narrative:    Patient has progressed to [facility] needs rather than skilled care. Spoke with son, Jesus Genera. Offered choice for [facility] RN, PT, OT, SW, and Aide. Referral accepted by [facility]. [facility] is also choice for home care for PCS. Expedited PCS form completed, signed by MD, and faxed to [employer]. [facility] referral placed for community case management - new cancer diagnosis. [employer] notified for RW to be delivered to the room. Patient to transition home today.   Update: Received call back from [employer]. Patient has MQB where her Medicaid pays her part B which disqualifies her from [facility] benefit. Spoke with son, Jesus Genera, to advise. [facility] notified of transition home with [facility] services only. [facility] will follow for assist with community needs. No further transition of care needs identified at this.    Final next level of care: [place] Barriers to Discharge: No Barriers Identified   Patient Goals and CMS Choice Patient states their goals for this hospitalization and ongoing recovery are:: return home CMS Medicare.gov Compare Post Acute Care list provided to:: Patient Represenative (must comment)(Charles Hassell Done - son) Choice offered to / list presented to : Adult Children  Discharge Placement                       Discharge Plan and Services In-house Referral: NA Discharge Planning Services: CM Consult Post Acute Care Choice: Home Health          DME Arranged: [place] rolling DME Agency: [employer] Date DME Agency Contacted: 11/01/18 Time DME Agency Contacted: [id number] Representative spoke with at DME Agency: [place]: RN, PT, OT, Nurse's Aide, Social Work [employer] Agency: [place] Date  [place]: 11/01/18 Time [place]: [place] Representative spoke with at [place]: [place] ([place]) Interventions     Readmission Risk Interventions No flowsheet data found.

## 2018-11-01 NOTE — Consult Note (Addendum)
   [facility] CM Inpatient Consult   11/01/2018  Carolyn [name] 05/06/1940 [id number]   Thank you for the  referral request and this patient is listed as in the [facility] Medicare SNP.  [employer] SNP will follow for transition needs in their program.   Spoke with inpatient [facility] RNCM regarding referral needs.  She states the patient has an expedited request to [facility] for PCS needs and patient with new diagnosis of lung cancer.  She also states that [facility] is arranged for Home Health needs including social worker. Patient's daughter is the contact  Support person Rynette Hassell Done at [phone].  Plan: Will alert [employer] SNP Coordinator of patient's follow up needs and potential barriers in transition.  Primary Care Provider is Biagio Borg, MD at [facility]. This office is listed to provide the Transition of Care follow up.  1630 Call from inpatient [facility] RNCM that the type of Medicaid patient has does not cover PCS. CSW with [facility] is following confirmed by [facility].  For questions, please contact:  Natividad Brood, RN BSN [facility] Liaison  [phone] business mobile phone Toll free office [phone]  Fax number: [phone] [email or web address]

## 2018-11-01 NOTE — Progress Notes (Signed)
DISCHARGE NOTE HOME MAUDINE KLUESNER to be discharged Home per MD order. Discussed prescriptions and follow up appointments with the patient. Prescriptions given to patient; medication list explained in detail. Patient verbalized understanding.  Skin clean, dry and intact without evidence of skin break down, no evidence of skin tears noted. IV catheter discontinued intact. Site without signs and symptoms of complications. Dressing and pressure applied. Pt denies pain at the site currently. No complaints noted.  Patient free of lines, drains, and wounds.   An After Visit Summary (AVS) was printed and given to the patient. Patient escorted via wheelchair, and discharged home via private auto.  Orville Govern, RN

## 2018-11-01 NOTE — Progress Notes (Signed)
Physical Therapy Treatment Patient Details Name: Carolyn [name] MRN: [id number] DOB: 07/06/1940 Today's Date: 11/01/2018    History of Present Illness 78 yo female smoker with PMHx: DM, diastolic CHF, Hypothyroidism, back pain, anxiety, depression, nephrolithiasis, IBS, insomnia, HLD, Colon polyps, GERD, HTN, s/p AICD, non ischemic CM, found down after fall at home.  Unable to give consistent account of fall and has had visual hallucinations.  CT chest showed lung mass.     PT Comments    Continuing work on functional mobility and activity tolerance;  Notable improvements in gait and balance; Able to walk hallways with Supervision for safety -- no overt losses of balance noted; At this point, SNF for rehabilitation in not needed; Recommend Community Case Mgmnt follow up with [facility], and HHSW follow up due to ongoing safety concerns; [name] does tell me her family checks in with her consistently (she tells me daily -- would need to be confirmed)   Follow Up Recommendations  Other (comment)(HHSW; HHRN; Community Case Mgmt with [facility])  24 hour Supervision would be optimal, but I'm not sure that it can easily be arranged     Equipment Recommendations  None recommended by PT    Recommendations for Other Services       Precautions / Restrictions Precautions Precautions: Fall Precaution Comments: Fall risk is lessening Restrictions Weight Bearing Restrictions: No    Mobility  Bed Mobility Overal bed mobility: Independent                Transfers Overall transfer level: Needs assistance Equipment used: None Transfers: Sit to/from Stand Sit to Stand: Supervision         General transfer comment: Supervision for safety with first time without RW  Ambulation/Gait Ambulation/Gait assistance: Supervision Gait Distance (Feet): 500 Feet Assistive device: None Gait Pattern/deviations: Step-through pattern;Drifts right/left(mild R/L drift) Gait velocity: reduced    General Gait Details: Walked without assistive device -- opted for trying without because of noted difficulty with RW, lots of cues for RW use, and her general imporvements in functional mobility since admission   Stairs Stairs: Yes Stairs assistance: Min guard;Min assist Stair Management: One rail Right;Step to pattern;Forwards Number of Stairs: 12 General stair comments: Managed flight of steps slowly and with heavy dependence on rail support; she attributes her step-to pattern to weakness and the step-to pattern is her baseline; slow but steady   Wheelchair Mobility    Modified Rankin (Stroke Patients Only)       Balance     Sitting balance-Leahy Scale: Good       Standing balance-Leahy Scale: Good                              Cognition Arousal/Alertness: Awake/alert Behavior During Therapy: Impulsive(distracted, questionable history) Overall Cognitive Status: Impaired/Different from baseline                                 General Comments: Tangential and requiring redirection back to tasks; Noting improvements here compared to last sessions      Exercises      General Comments General comments (skin integrity, edema, etc.): engaged in conversation during walk; at one point, she began toalking about conflict with her neighbors      Pertinent Vitals/Pain Pain Assessment: No/denies pain    Home Living  Prior Function            PT Goals (current goals can now be found in the care plan section) Acute Rehab PT Goals Patient Stated Goal: home PT Goal Formulation: With patient Time For Goal Achievement: 11/15/18 Potential to Achieve Goals: Good Progress towards PT goals: Goals met and updated - see care plan    Frequency    Min 3X/week      PT Plan Discharge plan needs to be updated;Equipment recommendations need to be updated    Co-evaluation              AM-PAC PT "6 Clicks"  Mobility   Outcome Measure  Help needed turning from your back to your side while in a flat bed without using bedrails?: None Help needed moving from lying on your back to sitting on the side of a flat bed without using bedrails?: None Help needed moving to and from a bed to a chair (including a wheelchair)?: None Help needed standing up from a chair using your arms (e.g., wheelchair or bedside chair)?: None Help needed to walk in hospital room?: None Help needed climbing 3-5 steps with a railing? : A Little 6 Click Score: 23    End of Session Equipment Utilized During Treatment: Gait belt Activity Tolerance: Patient tolerated treatment well Patient left: in bed;with call bell/phone within reach Nurse Communication: Mobility status PT Visit Diagnosis: Difficulty in walking, not elsewhere classified (R26.2)     Time: 3475-8307 PT Time Calculation (min) (ACUTE ONLY): 19 min  Charges:  $Gait Training: 8-22 mins                     Carolyn [name], PT  Acute Rehabilitation Services Pager [phone] Office [place] 11/01/2018, 10:18 AM

## 2018-11-01 NOTE — Telephone Encounter (Signed)
Oncology Nurse Navigator Documentation  Oncology Nurse Navigator Flowsheets 11/01/2018  Navigator Location [facility]  Navigator Encounter Type Telephone/I received referral on Carolyn [name] last week but due to her being in the hospital, I have not set her up for an appt.  I updated Dr. Julien Nordmann and he states he can see her this week.  I called the hospital and spoke with Ms. Stalzer.  I updated her on appt. She verbalized understanding.   Telephone Outgoing Call  Patient Visit Type Inpatient  Treatment Phase Pre-Tx/Tx Discussion  Barriers/Navigation Needs Coordination of Care;Education  Education Other  Interventions Coordination of Care;Education  Coordination of Care Appts  Education Method Verbal  Acuity Level 2  Time Spent with Patient 58

## 2018-11-01 NOTE — Discharge Instructions (Signed)
Confusion Confusion is the inability to think with the usual speed or clarity. People who are confused often describe their thinking as cloudy or unclear. Confusion can also include feeling disoriented. This means you are unaware of where you are or who you are. You may also not know the date or time. When confused, you may have difficulty remembering, paying attention, or making decisions. Some people also act aggressively when they are confused. In some cases, confusion may come on quickly. In other cases, it may develop slowly over time. How quickly confusion comes on depends on the cause. Confusion may be caused by:  Head injury (concussion).  Seizures.  Stroke.  Fever.  Brain tumor.  Decrease in brain function due to a vascular or neurologic condition (dementia).  Emotions, like rage or terror.  Inability to know what is real and what is not (hallucinations).  Infections, such as a urinary tract infection (UTI).  Using too much alcohol, drugs, or medicines.  Loss of fluid (dehydration) or an imbalance of salts in the body (electrolytes).  Lack of sleep.  Low blood sugar (diabetes).  Low levels of oxygen. This comes from conditions such as chronic lung disorders.  Side effects of medicines, or taking medicines that affect other medicines (drug interactions).  Lack of certain nutrients, especially niacin, thiamine, vitamin C, or vitamin B.  Sudden drop in body temperature (hypothermia).  Change in routine, such as traveling or being hospitalized. Follow these instructions at home: Pay attention to your symptoms. Tell your health care provider about any changes or if you develop new symptoms. Follow these instructions to control or treat symptoms. Ask a family member or friend for help if needed. Medicines  Take over-the-counter and prescription medicines only as told by your health care provider.  Ask your health care provider about changing or stopping any medicines  that may be causing your confusion.  Avoid pain medicines or sleep medicines until you have fully recovered.  Use a pillbox or an alarm to help you take the right medicines at the right time. Lifestyle   Eat a balanced diet that includes fruits and vegetables.  Get enough sleep. For most adults, this is 7-9 hours each night.  Do not drink alcohol.  Do not become isolated. Spend time with other people and make plans for your days.  Do not drive until your health care provider says that it is safe to do so.  Do not use any products that contain nicotine or tobacco, such as cigarettes and e-cigarettes. If you need help quitting, ask your health care provider.  Stop other activities that may increase your chances of getting hurt. These may include some work duties, sports activities, swimming, or bike riding. Ask your health care provider what activities are safe for you. What caregivers can do  Find out if the person is confused. Ask the person to state his or her name, age, and the date. If the person is unsure or answers incorrectly, he or she may be confused.  Always introduce yourself, no matter how well the person knows you.  Remind the person of his or her location. Do this often.  Place a calendar and clock near the person who is confused.  Talk about current events and plans for the day.  Keep the environment calm, quiet, and peaceful.  Help the person do the things that he or she is unable to do. These include: ? Taking medicines. ? Keeping follow-up visits with his or her health care  provider. ? Helping with household duties, including meal preparation. ? Running errands.  Get help if you need it. There are several support groups for caregivers.  If the person you are helping needs more support, consider day care, extended care programs, or a skilled nursing facility. The person's health care provider may be able to help evaluate these options. General  instructions  Monitor yourself for any conditions you may have. These may include: ? Checking your blood glucose levels, if you have diabetes. ? Watching your weight, if you are overweight. ? Monitoring your blood pressure, if you have hypertension. ? Monitoring your body temperature, if you have a fever.  Keep all follow-up visits as told by your health care provider. This is important. Contact a health care provider if:  Your symptoms get worse. Get help right away if you:  Feel that you are not able to care for yourself.  Develop severe headaches, repeated vomiting, seizures, blackouts, or slurred speech.  Have increasing confusion, weakness, numbness, restlessness, or personality changes.  Develop a loss of balance, have marked dizziness, feel uncoordinated, or fall.  Develop severe anxiety, or you have delusions or hallucinations. These symptoms may represent a serious problem that is an emergency. Do not wait to see if the symptoms will go away. Get medical help right away. Call your local emergency services (911 in the [place] Do not drive yourself to the hospital. Summary  Confusion is the inability to think with the usual speed or clarity. People who are confused often describe their thinking as cloudy or unclear.  Confusion can also include having difficulty remembering, paying attention, or making decisions.  Confusion may come on quickly or develop slowly over time, depending on the cause. There are many different causes of confusion.  Ask for help from family members or friends if you are unable to take care of yourself. This information is not intended to replace advice given to you by your health care provider. Make sure you discuss any questions you have with your health care provider. Document Released: 04/02/2004 Document Revised: 02/25/2017 Document Reviewed: 02/25/2017 Elsevier Patient Education  2020 [employer].

## 2018-11-01 NOTE — Clinical Social Work Note (Signed)
CSW received call from [place] and informed her that patient discharging home today as MD aware that insurance will not authorize ST rehab. 4:22 pm - CSW received a call from [place], Engineer, site at [facility] that insurance denied patient for rehab. Arbie Cookey informed that this was expected and patient is discharging home today. CSW signing off as no other SW interventions services needed.  Shallon Yaklin Givens, MSW, LCSW Licensed Clinical Social Worker [place] [phone]

## 2018-11-02 ENCOUNTER — Telehealth: Payer: Self-pay | Admitting: *Deleted

## 2018-11-02 ENCOUNTER — Encounter: Payer: Self-pay | Admitting: *Deleted

## 2018-11-02 NOTE — Progress Notes (Signed)
Oncology Nurse Navigator Documentation  Oncology Nurse Navigator Flowsheets 11/02/2018  Navigator Location [facility]  Navigator Encounter Type Other/I reached out to pathology about Ms. Drozdowski's recent bx adenocarcinoma.  I was told there is not enough tissue for molecular testing.  I updated Dr. Julien Nordmann.   Telephone -  Patient Visit Type -  Treatment Phase Pre-Tx/Tx Discussion  Barriers/Navigation Needs Coordination of Care  Education -  Interventions Coordination of Care  Coordination of Care -  Education Method -  Acuity Level 2  Time Spent with Patient 30

## 2018-11-02 NOTE — Telephone Encounter (Signed)
Called and LVM stating nurse was f/u after patient's hospital discharge. Nurse explained that the patient's instructions stated for them to make an appointment with PCP and nurse wanted to ensure they did not have any questions or concerns regarding their discharge. Nurse left her callback number for patient to reach her and she will call patient back later.

## 2018-11-03 ENCOUNTER — Telehealth: Payer: Self-pay | Admitting: Internal Medicine

## 2018-11-03 DIAGNOSIS — I110 Hypertensive heart disease with heart failure: Secondary | ICD-10-CM | POA: Diagnosis not present

## 2018-11-03 DIAGNOSIS — C3491 Malignant neoplasm of unspecified part of right bronchus or lung: Secondary | ICD-10-CM | POA: Diagnosis not present

## 2018-11-03 DIAGNOSIS — I5032 Chronic diastolic (congestive) heart failure: Secondary | ICD-10-CM | POA: Diagnosis not present

## 2018-11-03 DIAGNOSIS — E11649 Type 2 diabetes mellitus with hypoglycemia without coma: Secondary | ICD-10-CM | POA: Diagnosis not present

## 2018-11-03 DIAGNOSIS — D3500 Benign neoplasm of unspecified adrenal gland: Secondary | ICD-10-CM | POA: Diagnosis not present

## 2018-11-03 DIAGNOSIS — M4726 Other spondylosis with radiculopathy, lumbar region: Secondary | ICD-10-CM | POA: Diagnosis not present

## 2018-11-03 DIAGNOSIS — M47813 Spondylosis without myelopathy or radiculopathy, cervicothoracic region: Secondary | ICD-10-CM | POA: Diagnosis not present

## 2018-11-03 DIAGNOSIS — M4316 Spondylolisthesis, lumbar region: Secondary | ICD-10-CM | POA: Diagnosis not present

## 2018-11-03 DIAGNOSIS — D630 Anemia in neoplastic disease: Secondary | ICD-10-CM | POA: Diagnosis not present

## 2018-11-03 NOTE — Telephone Encounter (Signed)
Transition Care Management Follow-up Telephone Call  How have you been since you were released from the hospital? I feel better now that I am at home.    Do you understand why you were in the hospital? Yes, they said I had pneumonia but it turns out that I have lung cancer.   Do you understand the discharge instrcutions? Yes and my daughter and her husband are helping me.  Items Reviewed:  Medications reviewed: yes  Allergies reviewed: yes  Dietary changes reviewed: yes  Referrals reviewed: yes   Functional Questionnaire:   Activities of Daily Living (ADLs):   She states they are independent in the following: ambulation, feeding, continence and toileting States they require assistance with the following: bathing and hygiene, grooming and dressing, patient states HH is scheduled to come.   Any transportation issues/concerns?: no   Any patient concerns? no   Confirmed importance and date/time of follow-up visits scheduled: yes, Patient stated that she has several medical appointments scheduled for hospital f/u and does not know the day or times. Patient requested that nurse call Jesus Genera then [facility] to schedule PCP hosp f/u appointment. Nurse called both individuals and LVM. No PCP hosp f/u appointment has been scheduled as of yet. Nurse did leave her callback number on VM and she will reach out tomorrow to both individuals.    Confirmed with patient if condition begins to worsen call PCP or go to the ER.  Patient was given the Call-a-Nurse line [phone]: no

## 2018-11-03 NOTE — Telephone Encounter (Signed)
Heather from [facility] called and advised that Pt was discharged from the hospital and they asked for Nursing and Therapy orders and she wanted to see if Dr. Jenny Reichmann would sign off on those orders / please advise

## 2018-11-04 ENCOUNTER — Encounter: Payer: Self-pay | Admitting: Internal Medicine

## 2018-11-04 ENCOUNTER — Telehealth: Payer: Self-pay | Admitting: Internal Medicine

## 2018-11-04 ENCOUNTER — Inpatient Hospital Stay (HOSPITAL_BASED_OUTPATIENT_CLINIC_OR_DEPARTMENT_OTHER): Payer: Medicare HMO | Admitting: Internal Medicine

## 2018-11-04 ENCOUNTER — Other Ambulatory Visit: Payer: Self-pay | Admitting: *Deleted

## 2018-11-04 ENCOUNTER — Telehealth: Payer: Self-pay | Admitting: *Deleted

## 2018-11-04 ENCOUNTER — Inpatient Hospital Stay: Payer: Medicare HMO | Attending: Internal Medicine

## 2018-11-04 ENCOUNTER — Other Ambulatory Visit: Payer: Self-pay

## 2018-11-04 VITALS — BP 97/66 | HR 58 | Temp 97.8°F | Resp 18 | Ht 60.0 in | Wt 160.2 lb

## 2018-11-04 DIAGNOSIS — M4316 Spondylolisthesis, lumbar region: Secondary | ICD-10-CM | POA: Diagnosis not present

## 2018-11-04 DIAGNOSIS — C3491 Malignant neoplasm of unspecified part of right bronchus or lung: Secondary | ICD-10-CM | POA: Insufficient documentation

## 2018-11-04 DIAGNOSIS — E119 Type 2 diabetes mellitus without complications: Secondary | ICD-10-CM

## 2018-11-04 DIAGNOSIS — M47813 Spondylosis without myelopathy or radiculopathy, cervicothoracic region: Secondary | ICD-10-CM | POA: Diagnosis not present

## 2018-11-04 DIAGNOSIS — R911 Solitary pulmonary nodule: Secondary | ICD-10-CM

## 2018-11-04 DIAGNOSIS — Z720 Tobacco use: Secondary | ICD-10-CM | POA: Diagnosis not present

## 2018-11-04 DIAGNOSIS — I509 Heart failure, unspecified: Secondary | ICD-10-CM

## 2018-11-04 DIAGNOSIS — K219 Gastro-esophageal reflux disease without esophagitis: Secondary | ICD-10-CM | POA: Diagnosis not present

## 2018-11-04 DIAGNOSIS — C342 Malignant neoplasm of middle lobe, bronchus or lung: Secondary | ICD-10-CM | POA: Insufficient documentation

## 2018-11-04 DIAGNOSIS — R634 Abnormal weight loss: Secondary | ICD-10-CM

## 2018-11-04 DIAGNOSIS — D3500 Benign neoplasm of unspecified adrenal gland: Secondary | ICD-10-CM | POA: Diagnosis not present

## 2018-11-04 DIAGNOSIS — Z9181 History of falling: Secondary | ICD-10-CM

## 2018-11-04 DIAGNOSIS — E039 Hypothyroidism, unspecified: Secondary | ICD-10-CM | POA: Diagnosis not present

## 2018-11-04 DIAGNOSIS — M545 Low back pain: Secondary | ICD-10-CM | POA: Diagnosis not present

## 2018-11-04 DIAGNOSIS — E11649 Type 2 diabetes mellitus with hypoglycemia without coma: Secondary | ICD-10-CM | POA: Diagnosis not present

## 2018-11-04 DIAGNOSIS — Z7189 Other specified counseling: Secondary | ICD-10-CM | POA: Insufficient documentation

## 2018-11-04 DIAGNOSIS — F17200 Nicotine dependence, unspecified, uncomplicated: Secondary | ICD-10-CM

## 2018-11-04 DIAGNOSIS — I5032 Chronic diastolic (congestive) heart failure: Secondary | ICD-10-CM | POA: Diagnosis not present

## 2018-11-04 DIAGNOSIS — M4726 Other spondylosis with radiculopathy, lumbar region: Secondary | ICD-10-CM | POA: Diagnosis not present

## 2018-11-04 DIAGNOSIS — I110 Hypertensive heart disease with heart failure: Secondary | ICD-10-CM

## 2018-11-04 DIAGNOSIS — G8929 Other chronic pain: Secondary | ICD-10-CM

## 2018-11-04 DIAGNOSIS — R42 Dizziness and giddiness: Secondary | ICD-10-CM

## 2018-11-04 DIAGNOSIS — Z809 Family history of malignant neoplasm, unspecified: Secondary | ICD-10-CM

## 2018-11-04 DIAGNOSIS — R918 Other nonspecific abnormal finding of lung field: Secondary | ICD-10-CM

## 2018-11-04 DIAGNOSIS — C3490 Malignant neoplasm of unspecified part of unspecified bronchus or lung: Secondary | ICD-10-CM

## 2018-11-04 DIAGNOSIS — Z8042 Family history of malignant neoplasm of prostate: Secondary | ICD-10-CM

## 2018-11-04 DIAGNOSIS — D630 Anemia in neoplastic disease: Secondary | ICD-10-CM | POA: Diagnosis not present

## 2018-11-04 LAB — CBC WITH DIFFERENTIAL (CANCER CENTER ONLY)
Abs Immature Granulocytes: 0.03 10*3/uL (ref 0.00–0.07)
Basophils Absolute: 0.1 10*3/uL (ref 0.0–0.1)
Basophils Relative: 1 %
Eosinophils Absolute: 0.3 10*3/uL (ref 0.0–0.5)
Eosinophils Relative: 3 %
HCT: 35.2 % — ABNORMAL LOW (ref 36.0–46.0)
Hemoglobin: 11.6 g/dL — ABNORMAL LOW (ref 12.0–15.0)
Immature Granulocytes: 0 %
Lymphocytes Relative: 32 %
Lymphs Abs: 3.2 10*3/uL (ref 0.7–4.0)
MCH: 29.7 pg (ref 26.0–34.0)
MCHC: 33 g/dL (ref 30.0–36.0)
MCV: 90 fL (ref 80.0–100.0)
Monocytes Absolute: 0.9 10*3/uL (ref 0.1–1.0)
Monocytes Relative: 9 %
Neutro Abs: 5.6 10*3/uL (ref 1.7–7.7)
Neutrophils Relative %: 55 %
Platelet Count: 248 10*3/uL (ref 150–400)
RBC: 3.91 MIL/uL (ref 3.87–5.11)
RDW: 15.1 % (ref 11.5–15.5)
WBC Count: 10.1 10*3/uL (ref 4.0–10.5)
nRBC: 0 % (ref 0.0–0.2)

## 2018-11-04 LAB — CMP (CANCER CENTER ONLY)
ALT: 20 U/L (ref 0–44)
AST: 15 U/L (ref 15–41)
Albumin: 3.6 g/dL (ref 3.5–5.0)
Alkaline Phosphatase: 63 U/L (ref 38–126)
Anion gap: 9 (ref 5–15)
BUN: 21 mg/dL (ref 8–23)
CO2: 22 mmol/L (ref 22–32)
Calcium: 9 mg/dL (ref 8.9–10.3)
Chloride: 106 mmol/L (ref 98–111)
Creatinine: 0.86 mg/dL (ref 0.44–1.00)
GFR, Est AFR Am: >60 mL/min (ref >60)
GFR, Estimated: >60 mL/min (ref >60)
Glucose, Bld: 136 mg/dL — ABNORMAL HIGH (ref 70–99)
Potassium: 3.8 mmol/L (ref 3.5–5.1)
Sodium: 137 mmol/L (ref 135–145)
Total Bilirubin: 0.4 mg/dL (ref 0.3–1.2)
Total Protein: 6.8 g/dL (ref 6.5–8.1)

## 2018-11-04 NOTE — Telephone Encounter (Signed)
Carolyn [name] from [facility] called requesting verbal orders for OT 1x 2 weeks  Call back [phone]

## 2018-11-04 NOTE — Telephone Encounter (Signed)
Oncology Nurse Navigator Documentation  Oncology Nurse Navigator Flowsheets 11/04/2018  Navigator Location [facility]  Navigator Encounter Type Clinic/MDC;Telephone;Other  Telephone Outgoing Call/I spoke with patient today at clinic.  I gave and explained information on lung cancer and next steps.  I called pulmonary dept at [facility] and was able to schedule her PFT's.  I called and spoke with patient and granddaughter.  I updated them on location for COVID test, PFT's, and pre-procedure instructions for PFT's.    Patient Visit Type MedOnc  Treatment Phase Pre-Tx/Tx Discussion  Barriers/Navigation Needs Coordination of Care;Education  Education Newly Diagnosed Cancer Education;Other  Interventions Coordination of Care;Education  Coordination of Care Appts;Other  Education Method Verbal;Written  Acuity Level 3  Time Spent with Patient 60

## 2018-11-04 NOTE — Progress Notes (Signed)
The proposed treatment discussed in cancer conference 11/03/2018 is for discussion purpose only and is not a binding recommendation.  The patient was not physically examined nor present for their treatment options.  Therefore, final treatment plans cannot be decided.

## 2018-11-04 NOTE — Telephone Encounter (Signed)
Copied from [place] [id number]. Topic: General - Other >> Nov 03, 2018  4:54 PM Antonieta Iba C wrote: Reason for CRM: pts son Jesus Genera at [phone] is returning [place] call. They would like a call back.

## 2018-11-04 NOTE — Telephone Encounter (Signed)
Nurse called son-in-law back as requested. Patient does have an appointment schedule with PCP on 11/07/18 @1520 .

## 2018-11-04 NOTE — Telephone Encounter (Signed)
Verbal orders given via VM 

## 2018-11-04 NOTE — Progress Notes (Signed)
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 10/23/2018 10:06   Dg Chest Port 1 View  Result Date: 10/26/2018 CLINICAL DATA:  Shortness of breath EXAM: PORTABLE CHEST 1 VIEW COMPARISON:  10/25/2018 FINDINGS: Left AICD remains in place, unchanged. Heart is mildly enlarged. Increasing opacity at the right lung base laterally. Left lung clear. No effusion or acute bony abnormality. IMPRESSION: Increasing peripheral opacity at the right lung base could reflect atelectasis or infiltrate. Electronically Signed   By: Rolm Baptise M.D.   On: 10/26/2018 09:13   Dg Chest Port 1 View  Result Date: 10/25/2018 CLINICAL DATA:  Status post bronchoscopy EXAM: PORTABLE CHEST 1 VIEW COMPARISON:  10/23/2018 FINDINGS: Cardiac shadow is stable. Defibrillator is again noted and stable. The lungs are well aerated bilaterally. The right perihilar mass is again identified and stable. No pneumothorax is noted following biopsy. No bony abnormality is seen. IMPRESSION: No post  bronchoscopy pneumothorax is seen. Electronically Signed   By: Inez Catalina M.D.   On: 10/25/2018 09:04   Dg Chest Port 1 View  Result Date: 10/23/2018 CLINICAL DATA:  Shortness of breath. EXAM: PORTABLE CHEST 1 VIEW COMPARISON:  10/21/2018 FINDINGS: Lordotic positioning noted. Heart size is stable. Aortic atherosclerosis. Transvenous pacemaker remains in appropriate position. Right middle lobe collapse is again seen. Left lung is clear. No evidence of pleural effusion. IMPRESSION: Stable right middle lobe collapse.  No new findings. Electronically Signed   By: Marlaine Hind M.D.   On: 10/23/2018 04:48   Dg Hip Unilat With Pelvis 2-3 Views Right  Result Date: 10/21/2018 CLINICAL DATA:  Right hip pain following a fall today. EXAM: DG HIP (  WITH OR WITHOUT PELVIS) 2-3V RIGHT COMPARISON:  Right femur dated 05/27/2017. FINDINGS: Normal appearing hips without fracture or dislocation. Lower lumbar spine degenerative changes. IMPRESSION: No fracture or dislocation. Electronically Signed   By: Claudie Revering M.D.   On: 10/21/2018 20:12   Dg C-arm Bronchoscopy  Result Date: 10/25/2018 C-ARM BRONCHOSCOPY: Fluoroscopy was utilized by the requesting physician.  No radiographic interpretation.    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  The patient has a lot of comorbidities and I am not sure if she will be a good surgical candidate.   PLAN: I had a lengthy discussion with the patient and her family today about her current disease stage, prognosis and treatment options. I personally and independently reviewed the scan images and discussed the results with the patient and her family. I recommended for the patient to complete the staging work-up by ordering a PET scan as well as CT scan of the head  with and without contrast to rule out any metastatic disease.  The patient cannot have MRI of the brain because of the pacemaker and defibrillator. I also recommend for the patient to have pulmonary function test before evaluation by surgery. I have a concern that the patient would not be a good surgical candidate and if no evidence for disease metastasis, she may benefit from curative radiotherapy to the central right middle lobe lung mass as well as the right upper lobe nodule. I will arrange for the patient to come back for follow-up visit in 2 weeks for evaluation and more detailed discussion of her treatment options based on the imaging studies and pulmonary function test. For smoking cessation I strongly encouraged the patient to quit smoking. She was advised to call immediately if she has any concerning symptoms in the interval.  The patient voices understanding of current disease status and treatment options and is in agreement with the current care plan.  All questions were answered. The patient knows to call the clinic with any problems, questions or concerns. We can certainly see the patient much sooner if necessary.  Thank you so much for allowing me to participate in the care of Carolyn [name]. I will continue to follow up the patient with you and assist in her care.  I spent 40 minutes counseling the patient face to face. The total time spent in the appointment was 60 minutes.  Disclaimer: This note was dictated with voice recognition software. Similar sounding words can inadvertently be transcribed and may not be corrected upon review.   Eilleen Kempf November 04, 2018, 9:34 AM

## 2018-11-07 ENCOUNTER — Ambulatory Visit (INDEPENDENT_AMBULATORY_CARE_PROVIDER_SITE_OTHER): Payer: Medicare HMO | Admitting: Internal Medicine

## 2018-11-07 ENCOUNTER — Other Ambulatory Visit: Payer: Self-pay

## 2018-11-07 ENCOUNTER — Telehealth: Payer: Self-pay | Admitting: Internal Medicine

## 2018-11-07 ENCOUNTER — Encounter: Payer: Self-pay | Admitting: Internal Medicine

## 2018-11-07 VITALS — BP 114/76 | HR 76 | Temp 97.9°F | Ht 60.0 in | Wt 161.0 lb

## 2018-11-07 DIAGNOSIS — I10 Essential (primary) hypertension: Secondary | ICD-10-CM | POA: Diagnosis not present

## 2018-11-07 DIAGNOSIS — Z23 Encounter for immunization: Secondary | ICD-10-CM | POA: Diagnosis not present

## 2018-11-07 DIAGNOSIS — G9340 Encephalopathy, unspecified: Secondary | ICD-10-CM

## 2018-11-07 DIAGNOSIS — E119 Type 2 diabetes mellitus without complications: Secondary | ICD-10-CM

## 2018-11-07 DIAGNOSIS — D3500 Benign neoplasm of unspecified adrenal gland: Secondary | ICD-10-CM | POA: Diagnosis not present

## 2018-11-07 DIAGNOSIS — I110 Hypertensive heart disease with heart failure: Secondary | ICD-10-CM | POA: Diagnosis not present

## 2018-11-07 DIAGNOSIS — M4726 Other spondylosis with radiculopathy, lumbar region: Secondary | ICD-10-CM | POA: Diagnosis not present

## 2018-11-07 DIAGNOSIS — D630 Anemia in neoplastic disease: Secondary | ICD-10-CM | POA: Diagnosis not present

## 2018-11-07 DIAGNOSIS — M4316 Spondylolisthesis, lumbar region: Secondary | ICD-10-CM | POA: Diagnosis not present

## 2018-11-07 DIAGNOSIS — M47813 Spondylosis without myelopathy or radiculopathy, cervicothoracic region: Secondary | ICD-10-CM | POA: Diagnosis not present

## 2018-11-07 DIAGNOSIS — Z794 Long term (current) use of insulin: Secondary | ICD-10-CM

## 2018-11-07 DIAGNOSIS — IMO0001 Reserved for inherently not codable concepts without codable children: Secondary | ICD-10-CM

## 2018-11-07 DIAGNOSIS — C3491 Malignant neoplasm of unspecified part of right bronchus or lung: Secondary | ICD-10-CM | POA: Diagnosis not present

## 2018-11-07 DIAGNOSIS — E11649 Type 2 diabetes mellitus with hypoglycemia without coma: Secondary | ICD-10-CM | POA: Diagnosis not present

## 2018-11-07 DIAGNOSIS — I5032 Chronic diastolic (congestive) heart failure: Secondary | ICD-10-CM | POA: Diagnosis not present

## 2018-11-07 MED ORDER — PIOGLITAZONE HCL 30 MG PO TABS: 30.0000 mg | ORAL_TABLET | Freq: Every day | ORAL | 3 refills | Status: DC

## 2018-11-07 NOTE — Telephone Encounter (Signed)
Phone number provided below is not an accurate number for the OT.

## 2018-11-07 NOTE — Patient Instructions (Signed)
OK to reduce the Actos (pioglitazone) to 30 mg per day (from 45 mg) but make sure to check to see you are actually taking the 45 mg at home now  Your lung cancer is called "adenocarcinoma" and you should hear from Oncology about the next steps for treatment  Please continue all other medications as before, and refills have been done if requested.  Please have the pharmacy call with any other refills you may need.  Please continue your efforts at being more active, low cholesterol diet, and weight control.  Please keep your appointments with your specialists as you may have planned  No further lab work needed today  Please return in 3 months, or sooner if needed

## 2018-11-07 NOTE — Assessment & Plan Note (Signed)
Resolved, cont current med tx

## 2018-11-07 NOTE — Telephone Encounter (Signed)
Copied from [place] [id number]. Topic: Quick Communication - Home Health Verbal Orders >> Nov 07, 2018  1:26 PM Leward Quan A wrote: Caller/Agency: Caprice Red / Alvis Lemmings Callback Number: [phone] Requesting OT/PT/Skilled Nursing/Social Work/Speech Therapy: OT Frequency: 1 x wk for 2 wks

## 2018-11-07 NOTE — Assessment & Plan Note (Signed)
stable overall by history and exam, recent data reviewed with pt, and pt to continue medical treatment as before,  to f/u any worsening symptoms or concerns  

## 2018-11-07 NOTE — Telephone Encounter (Signed)
Verbal orders have been given  
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
## 2018-11-07 NOTE — Assessment & Plan Note (Signed)
Pt at risk for further low sugars due to normal a1c in the setting of variable taking meds as prescribed and anticipation of wt loss with lung CA in the near future.  OK to decrease the actos to 30 mg for now, cont all other meds, but would stop the actos completely for further recurrent low sugars

## 2018-11-08 ENCOUNTER — Other Ambulatory Visit: Payer: Self-pay | Admitting: Internal Medicine

## 2018-11-08 DIAGNOSIS — M4316 Spondylolisthesis, lumbar region: Secondary | ICD-10-CM | POA: Diagnosis not present

## 2018-11-08 DIAGNOSIS — E11649 Type 2 diabetes mellitus with hypoglycemia without coma: Secondary | ICD-10-CM | POA: Diagnosis not present

## 2018-11-08 DIAGNOSIS — M47813 Spondylosis without myelopathy or radiculopathy, cervicothoracic region: Secondary | ICD-10-CM | POA: Diagnosis not present

## 2018-11-08 DIAGNOSIS — I110 Hypertensive heart disease with heart failure: Secondary | ICD-10-CM | POA: Diagnosis not present

## 2018-11-08 DIAGNOSIS — C3491 Malignant neoplasm of unspecified part of right bronchus or lung: Secondary | ICD-10-CM | POA: Diagnosis not present

## 2018-11-08 DIAGNOSIS — I5032 Chronic diastolic (congestive) heart failure: Secondary | ICD-10-CM | POA: Diagnosis not present

## 2018-11-08 DIAGNOSIS — D3500 Benign neoplasm of unspecified adrenal gland: Secondary | ICD-10-CM | POA: Diagnosis not present

## 2018-11-08 DIAGNOSIS — M4726 Other spondylosis with radiculopathy, lumbar region: Secondary | ICD-10-CM | POA: Diagnosis not present

## 2018-11-08 DIAGNOSIS — D630 Anemia in neoplastic disease: Secondary | ICD-10-CM | POA: Diagnosis not present

## 2018-11-09 ENCOUNTER — Telehealth: Payer: Self-pay | Admitting: Medical Oncology

## 2018-11-09 NOTE — Telephone Encounter (Signed)
Went over appts with pt through sept 10th- date and time.

## 2018-11-09 NOTE — Telephone Encounter (Signed)
appts date ,time and location reviewed with Jesus Genera.

## 2018-11-10 ENCOUNTER — Telehealth: Payer: Self-pay | Admitting: *Deleted

## 2018-11-10 NOTE — Telephone Encounter (Signed)
Oncology Nurse Navigator Documentation  Oncology Nurse Navigator Flowsheets 11/10/2018  Navigator Location [facility]  Navigator Encounter Type Telephone/I received a call from patient's daughter.  She was upset appt were cancelled. I spoke with the daughter and updated.  I listened to her as she explained her feelings about plan of care.  I told her we will work on her schedule and not to worry.    Telephone Incoming Call  Patient Visit Type -  Treatment Phase Pre-Tx/Tx Discussion  Barriers/Navigation Needs Education  Education Other  Interventions Education  Coordination of Care -  Education Method Verbal  Acuity Level 2  Time Spent with Patient 30

## 2018-11-10 NOTE — Telephone Encounter (Signed)
Oncology Nurse Navigator Documentation  Oncology Nurse Navigator Flowsheets 11/10/2018  Navigator Location [facility]  Navigator Encounter Type Telephone/I called to follow up on why patient missed test.  She states she is unaware of appt.  I reminded her that we spoke about her appts.  I listened as she explained.  Per her instructions she asked that I call Mr. Hassell Done to update him that her appts have changed.  I called him but was unable to reach him or leave a vm message.   Telephone Outgoing Call  Patient Visit Type -  Treatment Phase Pre-Tx/Tx Discussion  Barriers/Navigation Needs Coordination of Care;Education  Education Other  Interventions Coordination of Care;Education  Coordination of Care Other  Education Method Verbal  Acuity Level 2  Time Spent with Patient 30

## 2018-11-11 ENCOUNTER — Telehealth: Payer: Self-pay | Admitting: Internal Medicine

## 2018-11-11 ENCOUNTER — Ambulatory Visit (HOSPITAL_COMMUNITY)
Admission: RE | Admit: 2018-11-11 | Discharge: 2018-11-11 | Disposition: A | Discharge status: Home / Self Care | Payer: Medicare HMO | Source: Ambulatory Visit | Attending: Internal Medicine | Admitting: Internal Medicine

## 2018-11-11 ENCOUNTER — Other Ambulatory Visit: Payer: Self-pay

## 2018-11-11 ENCOUNTER — Telehealth: Payer: Self-pay

## 2018-11-11 DIAGNOSIS — J988 Other specified respiratory disorders: Secondary | ICD-10-CM | POA: Insufficient documentation

## 2018-11-11 DIAGNOSIS — C3490 Malignant neoplasm of unspecified part of unspecified bronchus or lung: Secondary | ICD-10-CM | POA: Diagnosis not present

## 2018-11-11 DIAGNOSIS — W19XXXD Unspecified fall, subsequent encounter: Secondary | ICD-10-CM

## 2018-11-11 LAB — PULMONARY FUNCTION TEST
DL/VA % pred: 81 %
DL/VA: 3.42 ml/min/mmHg/L
DLCO cor % pred: 52 %
DLCO cor: 8.98 ml/min/mmHg
DLCO unc % pred: 49 %
DLCO unc: 8.44 ml/min/mmHg
FEF 25-75 Post: 0.73 L/sec
FEF 25-75 Pre: 0.57 L/sec
FEF2575-%Change-Post: 28 %
FEF2575-%Pred-Post: 50 %
FEF2575-%Pred-Pre: 39 %
FEV1-%Change-Post: 11 %
FEV1-%Pred-Post: 58 %
FEV1-%Pred-Pre: 52 %
FEV1-Post: 1.05 L
FEV1-Pre: 0.95 L
FEV1FVC-%Change-Post: 11 %
FEV1FVC-%Pred-Pre: 93 %
FEV6-%Change-Post: 1 %
FEV6-%Pred-Post: 58 %
FEV6-%Pred-Pre: 57 %
FEV6-Post: 1.34 L
FEV6-Pre: 1.32 L
FEV6FVC-%Change-Post: 2 %
FEV6FVC-%Pred-Post: 105 %
FEV6FVC-%Pred-Pre: 103 %
FVC-%Change-Post: 0 %
FVC-%Pred-Post: 55 %
FVC-%Pred-Pre: 56 %
FVC-Post: 1.35 L
FVC-Pre: 1.35 L
Post FEV1/FVC ratio: 78 %
Post FEV6/FVC ratio: 100 %
Pre FEV1/FVC ratio: 70 %
Pre FEV6/FVC Ratio: 97 %
RV % pred: 112 %
RV: 2.44 L
TLC % pred: 85 %
TLC: 3.92 L

## 2018-11-11 MED ORDER — ALBUTEROL SULFATE (2.5 MG/3ML) 0.083% IN NEBU
2.5000 mg | INHALATION_SOLUTION | Freq: Once | RESPIRATORY_TRACT | Status: AC
  Administered 2018-11-11: 2.5 mg via RESPIRATORY_TRACT

## 2018-11-11 NOTE — Telephone Encounter (Signed)
Don from [facility] home [facility] called stated patient declined home visit today due to patient being to tired.  Timmothy Sours would like verbal orders to see patient next week. Call back [phone]

## 2018-11-11 NOTE — Telephone Encounter (Signed)
Called Charles, LVM

## 2018-11-11 NOTE — Telephone Encounter (Signed)
Pt was seen on 8/31. Would another OV be needed or could the referral be put in?  Copied from [place] [phone]. Topic: Quick Communication - See Telephone Encounter >> Nov 10, 2018  5:20 PM Loma Boston wrote: CRM for notification. See Telephone encounter for: 11/10/18. Pt designated person Jesus Genera is requesting a call back at [phone] States that her Medicaid/Medicare is straighten out and they need a call as she is out of the hospital but they are needing to reach out for in-home assistance.

## 2018-11-11 NOTE — Addendum Note (Signed)
Addended by: Biagio Borg on: 11/11/2018 08:18 AM   Modules accepted: Orders

## 2018-11-11 NOTE — Telephone Encounter (Signed)
Ok, this is done 

## 2018-11-14 ENCOUNTER — Other Ambulatory Visit: Payer: Self-pay | Admitting: Internal Medicine

## 2018-11-14 DIAGNOSIS — I10 Essential (primary) hypertension: Secondary | ICD-10-CM

## 2018-11-15 ENCOUNTER — Other Ambulatory Visit: Payer: Self-pay

## 2018-11-15 ENCOUNTER — Encounter (HOSPITAL_COMMUNITY)
Admission: RE | Admit: 2018-11-15 | Discharge: 2018-11-15 | Disposition: A | Discharge status: Home / Self Care | Payer: Medicare HMO | Source: Ambulatory Visit | Attending: Internal Medicine | Admitting: Internal Medicine

## 2018-11-15 ENCOUNTER — Ambulatory Visit (HOSPITAL_COMMUNITY): Admission: RE | Admit: 2018-11-15 | Payer: Medicare HMO | Source: Ambulatory Visit

## 2018-11-15 DIAGNOSIS — Z79899 Other long term (current) drug therapy: Secondary | ICD-10-CM | POA: Insufficient documentation

## 2018-11-15 DIAGNOSIS — R590 Localized enlarged lymph nodes: Secondary | ICD-10-CM | POA: Diagnosis not present

## 2018-11-15 DIAGNOSIS — C3490 Malignant neoplasm of unspecified part of unspecified bronchus or lung: Secondary | ICD-10-CM | POA: Insufficient documentation

## 2018-11-15 DIAGNOSIS — R918 Other nonspecific abnormal finding of lung field: Secondary | ICD-10-CM | POA: Insufficient documentation

## 2018-11-15 LAB — GLUCOSE, CAPILLARY: Glucose-Capillary: 125 mg/dL — ABNORMAL HIGH (ref 70–99)

## 2018-11-15 MED ORDER — FLUDEOXYGLUCOSE F - 18 (FDG) INJECTION
8.0700 | Freq: Once | INTRAVENOUS | Status: AC
  Administered 2018-11-15: 13:00:00 8.07 via INTRAVENOUS

## 2018-11-15 NOTE — Telephone Encounter (Signed)
Noted  

## 2018-11-16 ENCOUNTER — Telehealth: Payer: Self-pay | Admitting: Cardiovascular Disease

## 2018-11-16 ENCOUNTER — Telehealth: Payer: Self-pay | Admitting: *Deleted

## 2018-11-16 DIAGNOSIS — C3491 Malignant neoplasm of unspecified part of right bronchus or lung: Secondary | ICD-10-CM | POA: Diagnosis not present

## 2018-11-16 DIAGNOSIS — Z9049 Acquired absence of other specified parts of digestive tract: Secondary | ICD-10-CM

## 2018-11-16 DIAGNOSIS — M4726 Other spondylosis with radiculopathy, lumbar region: Secondary | ICD-10-CM | POA: Diagnosis not present

## 2018-11-16 DIAGNOSIS — Z87440 Personal history of urinary (tract) infections: Secondary | ICD-10-CM

## 2018-11-16 DIAGNOSIS — I708 Atherosclerosis of other arteries: Secondary | ICD-10-CM

## 2018-11-16 DIAGNOSIS — Z6828 Body mass index (BMI) 28.0-28.9, adult: Secondary | ICD-10-CM

## 2018-11-16 DIAGNOSIS — F17210 Nicotine dependence, cigarettes, uncomplicated: Secondary | ICD-10-CM

## 2018-11-16 DIAGNOSIS — G4700 Insomnia, unspecified: Secondary | ICD-10-CM

## 2018-11-16 DIAGNOSIS — F411 Generalized anxiety disorder: Secondary | ICD-10-CM

## 2018-11-16 DIAGNOSIS — K219 Gastro-esophageal reflux disease without esophagitis: Secondary | ICD-10-CM

## 2018-11-16 DIAGNOSIS — N179 Acute kidney failure, unspecified: Secondary | ICD-10-CM

## 2018-11-16 DIAGNOSIS — Z85118 Personal history of other malignant neoplasm of bronchus and lung: Secondary | ICD-10-CM

## 2018-11-16 DIAGNOSIS — F329 Major depressive disorder, single episode, unspecified: Secondary | ICD-10-CM

## 2018-11-16 DIAGNOSIS — Z79891 Long term (current) use of opiate analgesic: Secondary | ICD-10-CM

## 2018-11-16 DIAGNOSIS — J309 Allergic rhinitis, unspecified: Secondary | ICD-10-CM

## 2018-11-16 DIAGNOSIS — E11649 Type 2 diabetes mellitus with hypoglycemia without coma: Secondary | ICD-10-CM | POA: Diagnosis not present

## 2018-11-16 DIAGNOSIS — M62838 Other muscle spasm: Secondary | ICD-10-CM

## 2018-11-16 DIAGNOSIS — M4316 Spondylolisthesis, lumbar region: Secondary | ICD-10-CM

## 2018-11-16 DIAGNOSIS — R441 Visual hallucinations: Secondary | ICD-10-CM

## 2018-11-16 DIAGNOSIS — D72829 Elevated white blood cell count, unspecified: Secondary | ICD-10-CM

## 2018-11-16 DIAGNOSIS — I5032 Chronic diastolic (congestive) heart failure: Secondary | ICD-10-CM | POA: Diagnosis not present

## 2018-11-16 DIAGNOSIS — I110 Hypertensive heart disease with heart failure: Secondary | ICD-10-CM | POA: Diagnosis not present

## 2018-11-16 DIAGNOSIS — D630 Anemia in neoplastic disease: Secondary | ICD-10-CM | POA: Diagnosis not present

## 2018-11-16 DIAGNOSIS — E669 Obesity, unspecified: Secondary | ICD-10-CM

## 2018-11-16 DIAGNOSIS — I428 Other cardiomyopathies: Secondary | ICD-10-CM

## 2018-11-16 DIAGNOSIS — G319 Degenerative disease of nervous system, unspecified: Secondary | ICD-10-CM

## 2018-11-16 DIAGNOSIS — I447 Left bundle-branch block, unspecified: Secondary | ICD-10-CM

## 2018-11-16 DIAGNOSIS — Z95810 Presence of automatic (implantable) cardiac defibrillator: Secondary | ICD-10-CM

## 2018-11-16 DIAGNOSIS — G9341 Metabolic encephalopathy: Secondary | ICD-10-CM

## 2018-11-16 DIAGNOSIS — Z905 Acquired absence of kidney: Secondary | ICD-10-CM

## 2018-11-16 DIAGNOSIS — K589 Irritable bowel syndrome without diarrhea: Secondary | ICD-10-CM

## 2018-11-16 DIAGNOSIS — L989 Disorder of the skin and subcutaneous tissue, unspecified: Secondary | ICD-10-CM

## 2018-11-16 DIAGNOSIS — D3500 Benign neoplasm of unspecified adrenal gland: Secondary | ICD-10-CM | POA: Diagnosis not present

## 2018-11-16 DIAGNOSIS — M67442 Ganglion, left hand: Secondary | ICD-10-CM

## 2018-11-16 DIAGNOSIS — Z9089 Acquired absence of other organs: Secondary | ICD-10-CM

## 2018-11-16 DIAGNOSIS — M47813 Spondylosis without myelopathy or radiculopathy, cervicothoracic region: Secondary | ICD-10-CM | POA: Diagnosis not present

## 2018-11-16 DIAGNOSIS — N281 Cyst of kidney, acquired: Secondary | ICD-10-CM

## 2018-11-16 DIAGNOSIS — Z86010 Personal history of colonic polyps: Secondary | ICD-10-CM

## 2018-11-16 DIAGNOSIS — Z87442 Personal history of urinary calculi: Secondary | ICD-10-CM

## 2018-11-16 DIAGNOSIS — Z8701 Personal history of pneumonia (recurrent): Secondary | ICD-10-CM

## 2018-11-16 DIAGNOSIS — E039 Hypothyroidism, unspecified: Secondary | ICD-10-CM

## 2018-11-16 NOTE — Telephone Encounter (Signed)
New message   Patient states that she is having problems with her device and has received 3 letters.Please call to discuss.

## 2018-11-16 NOTE — Telephone Encounter (Signed)
Pt notified to come tomorrow for appt with [facility]

## 2018-11-16 NOTE — Telephone Encounter (Signed)
Spoke w/ pt granddaughter she is going to have pt return call.

## 2018-11-16 NOTE — Telephone Encounter (Signed)
Pt states she was not able to get head CT yesterday because it was not authorized. Pt called her insurance and they state it authorized. Auth # [id number] and [id number].  Radiology scheduling notified- they will call patient to schedule

## 2018-11-16 NOTE — Telephone Encounter (Signed)
Follow up    Pt states that she got a letter about not receiving transmission and needed to come in. I told her I could schedule her yearly appt because she is due for her yearly appt. Pt would like a call back about her transmission.

## 2018-11-17 ENCOUNTER — Telehealth: Payer: Self-pay | Admitting: Physician Assistant

## 2018-11-17 ENCOUNTER — Ambulatory Visit: Payer: Medicare HMO | Admitting: Physician Assistant

## 2018-11-17 ENCOUNTER — Inpatient Hospital Stay: Payer: Medicare HMO | Attending: Internal Medicine | Admitting: Physician Assistant

## 2018-11-17 ENCOUNTER — Ambulatory Visit (HOSPITAL_COMMUNITY)
Admission: RE | Admit: 2018-11-17 | Discharge: 2018-11-17 | Disposition: A | Discharge status: Home / Self Care | Payer: Medicare HMO | Source: Ambulatory Visit | Attending: Internal Medicine | Admitting: Internal Medicine

## 2018-11-17 ENCOUNTER — Other Ambulatory Visit: Payer: Self-pay

## 2018-11-17 ENCOUNTER — Telehealth: Payer: Self-pay | Admitting: *Deleted

## 2018-11-17 ENCOUNTER — Encounter: Payer: Self-pay | Admitting: *Deleted

## 2018-11-17 ENCOUNTER — Encounter: Payer: Self-pay | Admitting: Physician Assistant

## 2018-11-17 VITALS — BP 138/63 | HR 65 | Temp 98.9°F | Resp 18 | Ht 60.0 in | Wt 165.0 lb

## 2018-11-17 DIAGNOSIS — Z950 Presence of cardiac pacemaker: Secondary | ICD-10-CM | POA: Diagnosis not present

## 2018-11-17 DIAGNOSIS — R05 Cough: Secondary | ICD-10-CM | POA: Insufficient documentation

## 2018-11-17 DIAGNOSIS — Z95810 Presence of automatic (implantable) cardiac defibrillator: Secondary | ICD-10-CM | POA: Insufficient documentation

## 2018-11-17 DIAGNOSIS — Z720 Tobacco use: Secondary | ICD-10-CM | POA: Diagnosis not present

## 2018-11-17 DIAGNOSIS — R911 Solitary pulmonary nodule: Secondary | ICD-10-CM | POA: Diagnosis not present

## 2018-11-17 DIAGNOSIS — C3491 Malignant neoplasm of unspecified part of right bronchus or lung: Secondary | ICD-10-CM | POA: Diagnosis not present

## 2018-11-17 DIAGNOSIS — Z7189 Other specified counseling: Secondary | ICD-10-CM | POA: Diagnosis not present

## 2018-11-17 DIAGNOSIS — C342 Malignant neoplasm of middle lobe, bronchus or lung: Secondary | ICD-10-CM | POA: Insufficient documentation

## 2018-11-17 DIAGNOSIS — K5900 Constipation, unspecified: Secondary | ICD-10-CM | POA: Diagnosis not present

## 2018-11-17 DIAGNOSIS — C3490 Malignant neoplasm of unspecified part of unspecified bronchus or lung: Secondary | ICD-10-CM | POA: Diagnosis not present

## 2018-11-17 DIAGNOSIS — R42 Dizziness and giddiness: Secondary | ICD-10-CM | POA: Insufficient documentation

## 2018-11-17 MED ORDER — IOHEXOL 300 MG/ML  SOLN
75.0000 mL | Freq: Once | INTRAMUSCULAR | Status: AC | PRN
  Administered 2018-11-17: 75 mL via INTRAVENOUS

## 2018-11-17 MED ORDER — SODIUM CHLORIDE (PF) 0.9 % IJ SOLN
INTRAMUSCULAR | Status: AC
  Filled 2018-11-17: qty 50

## 2018-11-17 NOTE — Progress Notes (Signed)
Oncology Nurse Navigator Documentation  Oncology Nurse Navigator Flowsheets 11/17/2018  Navigator Location [facility]  Navigator Encounter Type Other/per Dr. Julien Nordmann, I updated Dr. Roxan Hockey on referral.  He updated Dr. Julien Nordmann on his thoughts.  Referral to Rad Onc completed by PA.    Telephone -  Patient Visit Type -  Treatment Phase -  Barriers/Navigation Needs Coordination of Care  Education -  Interventions Coordination of Care  Coordination of Care Other  Education Method -  Acuity Level 2  Time Spent with Patient 30

## 2018-11-17 NOTE — Progress Notes (Signed)
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 for lung cancer. EXAM: NUCLEAR MEDICINE PET SKULL BASE TO THIGH TECHNIQUE: 8.07 mCi F-18 FDG was injected intravenously. Full-ring PET imaging was performed from the skull base to thigh after the radiotracer. CT data was obtained and used for attenuation correction and anatomic localization. Fasting blood glucose: 125 mg/dl COMPARISON:  CT chest 10/21/2018 FINDINGS: Mediastinal blood pool activity: SUV max 2.76 Liver activity: SUV max NA NECK: No hypermetabolic lymph nodes in the neck. Focal area of increased uptake within the left supraclavicular region has an SUV max of 7.18. This area is obscured by beam hardening artifact from patient's left chest wall pacer device. Incidental CT findings: none CHEST: The perihilar lung mass within the right midlung measures 3.2 cm and has an SUV max 17.65. There is postobstructive atelectasis of the right middle lobe. 8 mm left pre-vascular node has an SUV max of 3.97. Nonspecific. 1.0 cm subcarinal lymph node has an SUV max of 4.53. Within the right lower lobe there is a 7 mm nodule within SUV max of 1.34. This is technically too small to reliably characterize by PET-CT. Right upper  lobe pulmonary nodule measures 1 cm and has an SUV max of 2.1. Incidental CT findings: Mild centrilobular emphysema. Aortic atherosclerosis with coronary artery calcifications. ABDOMEN/PELVIS: No abnormal FDG uptake within the liver, pancreas, spleen, or adrenal glands. There are 2 low-attenuation nodules within the left adrenal gland which do not exhibit FDG uptake and are favored to represent benign adenomas. FDG avid left external iliac lymph node measures 1.1 cm and has an SUV max of 11.6. Incidental CT findings: Aortic atherosclerosis.  No aneurysm. SKELETON: Focal area of increased uptake localizing to the sacrococcygeal junction has an SUV max 4.27. This is indeterminate. Incidental CT findings: none IMPRESSION: 1. There is intense FDG uptake associated with right hilar lung mass compatible with primary bronchogenic carcinoma. Associated postobstructive pneumonitis involves the right middle lobe. Mild increased uptake associated with non pathologically enlarged left pre-vascular and subcarinal lymph nodes. 2. There are 2 nonspecific nodules within the right lung. The largest  measures 1 cm with SUV max of 2.1. Pulmonary metastasis cannot be excluded. 3. The focal area of increased uptake within the sacrococcygeal region may be posttraumatic. Bone metastasis is not excluded however. Suggest correlation with any history of trauma. 4. Intense focus of increased uptake localizing to the left supraclavicular region. On the corresponding CT images this area is obscured by beam hardening artifact from the left chest wall pacer device. FDG avid supraclavicular lymph node cannot be excluded. 5. Indeterminate enlarged and intensely FDG avid left external iliac lymph node has an SUV max of 11.6. This would be an atypical location for nodal metastasis without additional disease within the abdomen or pelvis. Electronically Signed   By: Kerby Moors M.D.   On: 11/15/2018 15:32   Dg Chest Port 1 View  Result Date:  10/26/2018 CLINICAL DATA:  Shortness of breath EXAM: PORTABLE CHEST 1 VIEW COMPARISON:  10/25/2018 FINDINGS: Left AICD remains in place, unchanged. Heart is mildly enlarged. Increasing opacity at the right lung base laterally. Left lung clear. No effusion or acute bony abnormality. IMPRESSION: Increasing peripheral opacity at the right lung base could reflect atelectasis or infiltrate. Electronically Signed   By: Rolm Baptise M.D.   On: 10/26/2018 09:13   Dg Chest Port 1 View  Result Date: 10/25/2018 CLINICAL DATA:  Status post bronchoscopy EXAM: PORTABLE CHEST 1 VIEW COMPARISON:  10/23/2018 FINDINGS: Cardiac shadow is stable. Defibrillator is again noted and stable. The lungs are well aerated bilaterally. The right perihilar mass is again identified and stable. No pneumothorax is noted following biopsy. No bony abnormality is seen. IMPRESSION: No post bronchoscopy pneumothorax is seen. Electronically Signed   By: Inez Catalina M.D.   On: 10/25/2018 09:04   Dg Chest Port 1 View  Result Date: 10/23/2018 CLINICAL DATA:  Shortness of breath. EXAM: PORTABLE CHEST 1 VIEW COMPARISON:  10/21/2018 FINDINGS: Lordotic positioning noted. Heart size is stable. Aortic atherosclerosis. Transvenous pacemaker remains in appropriate position. Right middle lobe collapse is again seen. Left lung is clear. No evidence of pleural effusion. IMPRESSION: Stable right middle lobe collapse.  No new findings. Electronically Signed   By: Marlaine Hind M.D.   On: 10/23/2018 04:48   Dg Hip Unilat With Pelvis 2-3 Views Right  Result Date: 10/21/2018 CLINICAL DATA:  Right hip pain following a fall today. EXAM: DG HIP (WITH OR WITHOUT PELVIS) 2-3V RIGHT COMPARISON:  Right femur dated 05/27/2017. FINDINGS: Normal appearing hips without fracture or dislocation. Lower lumbar spine degenerative changes. IMPRESSION: No fracture or dislocation. Electronically Signed   By: Claudie Revering M.D.   On: 10/21/2018 20:12   Dg C-arm  Bronchoscopy  Result Date: 10/25/2018 C-ARM BRONCHOSCOPY: Fluoroscopy was utilized by the requesting physician.  No radiographic interpretation.     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  She presented with a central obstructive right middle lobe lung mass in addition to a right upper lobe nodule. Shee was diagnosed in August 2020.   The patient recently had a PET scan performed.  Dr. Julien Nordmann personally and independently reviewed the scan and discussed the results with the patient today. The scan once again noted the right hilar lung mass without any evidence of disease in the abdomen and pelvis.   Dr. Julien Nordmann had a lengthy discussion today with patient about her  current condition and treatment options.  The patient was discussed with cardiothoracic surgery who determined that the patient is not a surgical candidate. Dr. Julien Nordmann recommends that the patient be treated with radiation to the lung mass.   I have placed a referral to radiation oncology.   We will see the patient back for a follow up visit in 4 months for evaluation with a restaging CT scan of the chest at that time.  The patient was strongly encouraged to quit smoking. She is in the process of trying to quit.   The patient was advised to call immediately if she has any concerning symptoms in the interval. The patient voices understanding of current disease status and treatment options and is in agreement with the current care plan. All questions were answered. The patient knows to call the clinic with any problems, questions or concerns. We can certainly see the patient much sooner if necessary  Orders Placed This Encounter  Procedures  . CT Chest W Contrast    Standing Status:   Future    Standing  Expiration Date:   11/17/2019    Order Specific Question:   ** REASON FOR EXAM (FREE TEXT)    Answer:   Restaging Lung Cancer    Order Specific Question:   If indicated for the ordered procedure, I authorize the administration of contrast media per Radiology protocol    Answer:   Yes    Order Specific Question:   Preferred imaging location?    Answer:   [facility]    Order Specific Question:   Radiology Contrast Protocol - do NOT remove file path    Answer:   \\[email or web address].pdf  . CMP ([place] only)    Standing Status:   Future    Standing Expiration Date:   11/17/2019  . CBC with Differential (Cancer Center Only)    Standing Status:   Future    Standing Expiration Date:   11/17/2019  . Ambulatory referral to Radiation Oncology    Referral Priority:   Routine    Referral Type:   Consultation    Referral Reason:   Specialty Services Required    Requested Specialty:   Radiation Oncology    Number of Visits Requested:   1  . Ambulatory referral to Cardiothoracic Surgery    Referral Priority:   Routine    Referral Type:   Surgical    Referral Reason:   Specialty Services Required    Requested Specialty:   Cardiothoracic Surgery    Number of Visits Requested:   [place], PA-C 11/17/18  ADDENDUM: Hematology/Oncology Attending: I had a face-to-face encounter with the patient today.  I recommended her care plan.  This is a very pleasant 78 years old white female with stage IIb non-small cell lung cancer, adenocarcinoma presented with central obstructive right middle lobe lung mass in addition to right upper lobe lung nodule.  The patient had several studies performed recently including a PET scan.  I personally and independently reviewed the scan images and discussed the result and showed the images to the patient today. The patient is also scheduled for CT of the head later today to rule out any metastatic disease to the  brain. Her case was discussed with cardiothoracic surgery and she is not a good surgical candidate for resection based on her pulmonary function and other comorbidities. We will refer the patient to radiation oncology for consideration of curative course of radiotherapy. We  will arrange for the patient to come back for follow-up visit in 4 months for evaluation with repeat CT scan of the chest for restaging of her disease after the radiotherapy. For smoking cessation, we strongly encouraged the patient to quit smoking. The patient was advised to call immediately if she has any concerning symptoms in the interval.  Disclaimer: This note was dictated with voice recognition software. Similar sounding words can inadvertently be transcribed and may be missed upon review. Eilleen Kempf, MD 11/17/18

## 2018-11-17 NOTE — Telephone Encounter (Signed)
Appears most recent scheduled transmissions were received on 07/18/18 and 10/17/18. Letters in [place] state that transmissions have been received. Next transmission is scheduled for 01/16/2019 and should be automatic.

## 2018-11-17 NOTE — Telephone Encounter (Signed)
Per Cassie Heilingoetter, PA called patient to advise that case was reviewed with surgeon and currently with her PFT's she is not a surgical candidate and she will be lined up with radiation oncology next.    Explained this to the patient.  She denies any further questions at this time.

## 2018-11-17 NOTE — Telephone Encounter (Signed)
Scheduled appt per 9/10 los -mailed letter with appt date and time

## 2018-11-17 NOTE — Patient Instructions (Signed)
-  We would like to have you evaluated by radiation oncology and cardiothoracic surgery to see what they think is the best option for treatment. Fortunately, the scan showed the cancer is only in the lung. If surgery thinks that you are a candidate, they can discuss surgery with you. If not, then they will do radiation to kill the tumor in the lung. We will discuss your care with the radiation oncologist and surgeons.  -Dr. Julien Nordmann and I will see you in 4 months with a repeat CT scan of the chest to see how your treatment affected the cancer. For your records, the number to radiology scheduling is [phone]. They should call you to schedule your scan but here is the number in case you need to get a hold of them to schedule it.

## 2018-11-18 ENCOUNTER — Telehealth: Payer: Self-pay | Admitting: Internal Medicine

## 2018-11-18 DIAGNOSIS — E11649 Type 2 diabetes mellitus with hypoglycemia without coma: Secondary | ICD-10-CM | POA: Diagnosis not present

## 2018-11-18 DIAGNOSIS — M4726 Other spondylosis with radiculopathy, lumbar region: Secondary | ICD-10-CM | POA: Diagnosis not present

## 2018-11-18 DIAGNOSIS — I5032 Chronic diastolic (congestive) heart failure: Secondary | ICD-10-CM | POA: Diagnosis not present

## 2018-11-18 DIAGNOSIS — D630 Anemia in neoplastic disease: Secondary | ICD-10-CM | POA: Diagnosis not present

## 2018-11-18 DIAGNOSIS — M4316 Spondylolisthesis, lumbar region: Secondary | ICD-10-CM | POA: Diagnosis not present

## 2018-11-18 DIAGNOSIS — C3491 Malignant neoplasm of unspecified part of right bronchus or lung: Secondary | ICD-10-CM | POA: Diagnosis not present

## 2018-11-18 DIAGNOSIS — D3500 Benign neoplasm of unspecified adrenal gland: Secondary | ICD-10-CM | POA: Diagnosis not present

## 2018-11-18 DIAGNOSIS — I110 Hypertensive heart disease with heart failure: Secondary | ICD-10-CM | POA: Diagnosis not present

## 2018-11-18 DIAGNOSIS — M47813 Spondylosis without myelopathy or radiculopathy, cervicothoracic region: Secondary | ICD-10-CM | POA: Diagnosis not present

## 2018-11-18 NOTE — Telephone Encounter (Signed)
Spoke with patient. Explained remote transmission schedule and in-office f/u with Jens Som, PA, on 12/16/18. Pt verbalizes understanding. No further questions at this time.

## 2018-11-18 NOTE — Telephone Encounter (Signed)
Just the 45 mg, thanks

## 2018-11-18 NOTE — Telephone Encounter (Signed)
Should pt be taking the Actos 30mg  or 45mg ? Both are listed in her chart. Langley Gauss would like clarification. Please advise.

## 2018-11-18 NOTE — Telephone Encounter (Signed)
Langley Gauss has been informed

## 2018-11-18 NOTE — Telephone Encounter (Signed)
Denise from [facility] called stating patient has not picked up new prescription for actos 30mg . So patient is still taking old prescription.    Blood sugar today was 193  Denise call back [phone]

## 2018-11-18 NOTE — Progress Notes (Signed)
No ICM remote transmission received for 11/15/2018 and next ICM transmission scheduled for 01/17/2019.  Office visit for defib check with Tommye Standard PA on 12/16/2018.

## 2018-11-21 ENCOUNTER — Encounter: Payer: Self-pay | Admitting: *Deleted

## 2018-11-21 ENCOUNTER — Telehealth: Payer: Self-pay | Admitting: Internal Medicine

## 2018-11-21 DIAGNOSIS — M4316 Spondylolisthesis, lumbar region: Secondary | ICD-10-CM | POA: Diagnosis not present

## 2018-11-21 DIAGNOSIS — C3491 Malignant neoplasm of unspecified part of right bronchus or lung: Secondary | ICD-10-CM | POA: Diagnosis not present

## 2018-11-21 DIAGNOSIS — E11649 Type 2 diabetes mellitus with hypoglycemia without coma: Secondary | ICD-10-CM | POA: Diagnosis not present

## 2018-11-21 DIAGNOSIS — D630 Anemia in neoplastic disease: Secondary | ICD-10-CM | POA: Diagnosis not present

## 2018-11-21 DIAGNOSIS — M47813 Spondylosis without myelopathy or radiculopathy, cervicothoracic region: Secondary | ICD-10-CM | POA: Diagnosis not present

## 2018-11-21 DIAGNOSIS — R269 Unspecified abnormalities of gait and mobility: Secondary | ICD-10-CM

## 2018-11-21 DIAGNOSIS — M4726 Other spondylosis with radiculopathy, lumbar region: Secondary | ICD-10-CM | POA: Diagnosis not present

## 2018-11-21 DIAGNOSIS — I110 Hypertensive heart disease with heart failure: Secondary | ICD-10-CM | POA: Diagnosis not present

## 2018-11-21 DIAGNOSIS — D3500 Benign neoplasm of unspecified adrenal gland: Secondary | ICD-10-CM | POA: Diagnosis not present

## 2018-11-21 DIAGNOSIS — I5032 Chronic diastolic (congestive) heart failure: Secondary | ICD-10-CM | POA: Diagnosis not present

## 2018-11-21 NOTE — Telephone Encounter (Signed)
Copied from [place] [id number]. Topic: General - Other >> Nov 21, 2018  4:26 PM Sheran Luz wrote: Carolyn [name], Carolyn [name] with [facility], calling to request RX for "rollator" [facility].

## 2018-11-21 NOTE — Progress Notes (Signed)
Oncology Nurse Navigator Documentation  Oncology Nurse Navigator Flowsheets 11/21/2018  Diagnosis Status Confirmed Diagnosis Complete  Planned Course of Treatment Radiation  Navigator Location [facility]  Navigator Encounter Type Other/I followed up on Ms. Hellstrom's appt.  She is scheduled to see Rad Onc this week. Plan of care is set up with rad onc appt this week.   Telephone -  Abnormal Finding Date 10/22/2018  Confirmed Diagnosis Date 10/25/2018  Patient Visit Type -  Treatment Phase Pre-Tx/Tx Discussion  Barriers/Navigation Needs -  Education Other  Interventions Other  Coordination of Care Other  Education Method -  Acuity Level 1  Time Spent with Patient 15

## 2018-11-22 ENCOUNTER — Telehealth: Payer: Self-pay

## 2018-11-22 DIAGNOSIS — D630 Anemia in neoplastic disease: Secondary | ICD-10-CM | POA: Diagnosis not present

## 2018-11-22 DIAGNOSIS — M47813 Spondylosis without myelopathy or radiculopathy, cervicothoracic region: Secondary | ICD-10-CM | POA: Diagnosis not present

## 2018-11-22 DIAGNOSIS — M4316 Spondylolisthesis, lumbar region: Secondary | ICD-10-CM | POA: Diagnosis not present

## 2018-11-22 DIAGNOSIS — C3491 Malignant neoplasm of unspecified part of right bronchus or lung: Secondary | ICD-10-CM | POA: Diagnosis not present

## 2018-11-22 DIAGNOSIS — E11649 Type 2 diabetes mellitus with hypoglycemia without coma: Secondary | ICD-10-CM | POA: Diagnosis not present

## 2018-11-22 DIAGNOSIS — I5032 Chronic diastolic (congestive) heart failure: Secondary | ICD-10-CM | POA: Diagnosis not present

## 2018-11-22 DIAGNOSIS — I110 Hypertensive heart disease with heart failure: Secondary | ICD-10-CM | POA: Diagnosis not present

## 2018-11-22 DIAGNOSIS — D3500 Benign neoplasm of unspecified adrenal gland: Secondary | ICD-10-CM | POA: Diagnosis not present

## 2018-11-22 DIAGNOSIS — M4726 Other spondylosis with radiculopathy, lumbar region: Secondary | ICD-10-CM | POA: Diagnosis not present

## 2018-11-22 NOTE — Addendum Note (Signed)
Addended by: Biagio Borg on: 11/22/2018 03:46 PM   Modules accepted: Orders

## 2018-11-22 NOTE — Telephone Encounter (Signed)
Done hardcopy to shirron 

## 2018-11-22 NOTE — Telephone Encounter (Signed)
Faxed

## 2018-11-22 NOTE — Progress Notes (Signed)
Thoracic Location of Tumor / Histology: Stage IIb non-small cell lung cancer- adenocarcinoma  Patient presented to the ED after sustaining a fall at home.  Bumped her head, knocked out.  CT Head 11/17/2018: Negative for metastatic disease to the brain.  PET 11/15/2018: There is intense FDG uptake associated with right hilar lung mass compatible with primary bronchogenic carcinoma. Associated postobstructive pneumonitis involves the right middle lobe. Mild increased uptake associated with non pathologically enlarged left pre-vascular and subcarinal lymph nodes.  2. There are 2 nonspecific nodules within the right lung. The largest measures 1 cm with SUV max of 2.1. Pulmonary metastasis cannot be excluded.  Indeterminate enlarged and intensely FDG avid left external iliac lymph node has an SUV max of 11.6. This would be an atypical location for nodal metastasis without additional disease within the abdomen or pelvis.  CTA Chest /abdomen and pevis 10/21/2018: Right hilar mass causing occlusion of the right middle lobar pulmonary artery.  This is most consistent with bronchogenic carcinoma.  Tiny nodules of the right lung apex could represent satellite nodules.  CT Head 10/21/2018:  No acute intracranial abnormality.  No skull fracture.     Biopsies of Right middle lobe lung 10/25/2018   Tobacco/Marijuana/Snuff/ETOH use: Current smoker, trying to quit.  Past/Anticipated interventions by cardiothoracic surgery, if any:  -Given her PFT's she is not a surgical candidate.  Past/Anticipated interventions by medical oncology, if any:  PA Heilingoetter 11/17/2018 -Dr. Julien Nordmann had a lengthy discussion today with patient about her current condition and treatment options.  The patient was discussed with cardiothoracic surgery who determined that the patient is not a surgical candidate. Dr. Julien Nordmann recommends that the patient be treated with radiation to the lung mass.  -I have placed a referral to radiation  oncology. -We will see the patient back for a follow-up visit in 4 months for evaluation with a restaging CT scan of the chest at that time.   Signs/Symptoms  Weight changes, if any: Weight has been stable.  Respiratory complaints, if any: No  Hemoptysis, if any: Has non productive cough at baseline, occasional clear/yellow phlegm.    Pain issues, if any:  Tailbone, low and mid spine, right side at incision site, 7/10, takes tramadol  SAFETY ISSUES:  Prior radiation? No  Pacemaker/ICD? Yes,  Possible current pregnancy? Hysterectomy  Is the patient on methotrexate? No  Current Complaints / other details:

## 2018-11-22 NOTE — Telephone Encounter (Signed)
Carolyn [name] just wanted to make sure that he is in the loop with his mother-in-law.    Copied from [place] [id number]. Topic: General - Other >> Nov 22, 2018  1:32 PM [name] wrote: Reason for CRM: pt son in law Jesus Genera who is on the [facility] and would dr Jenny Reichmann to call him at his earliest time. Carolyn [name] would like to touch base with dr Jenny Reichmann

## 2018-11-22 NOTE — Telephone Encounter (Signed)
Not sure where the first rx went  rx now redone

## 2018-11-23 ENCOUNTER — Ambulatory Visit
Admission: RE | Admit: 2018-11-23 | Discharge: 2018-11-23 | Disposition: A | Discharge status: Home / Self Care | Payer: Medicare HMO | Source: Ambulatory Visit | Attending: Radiation Oncology | Admitting: Radiation Oncology

## 2018-11-23 ENCOUNTER — Telehealth: Payer: Self-pay | Admitting: Internal Medicine

## 2018-11-23 ENCOUNTER — Encounter: Payer: Self-pay | Admitting: Radiation Oncology

## 2018-11-23 ENCOUNTER — Other Ambulatory Visit: Payer: Self-pay

## 2018-11-23 VITALS — Ht 60.0 in | Wt 150.0 lb

## 2018-11-23 DIAGNOSIS — Z95810 Presence of automatic (implantable) cardiac defibrillator: Secondary | ICD-10-CM | POA: Diagnosis not present

## 2018-11-23 DIAGNOSIS — F17210 Nicotine dependence, cigarettes, uncomplicated: Secondary | ICD-10-CM | POA: Diagnosis not present

## 2018-11-23 DIAGNOSIS — C3491 Malignant neoplasm of unspecified part of right bronchus or lung: Secondary | ICD-10-CM

## 2018-11-23 DIAGNOSIS — C342 Malignant neoplasm of middle lobe, bronchus or lung: Secondary | ICD-10-CM | POA: Diagnosis not present

## 2018-11-23 NOTE — Telephone Encounter (Signed)
Timmothy Sours would like to add one OT visit this week due to missed visit on 11-11-2018

## 2018-11-23 NOTE — Telephone Encounter (Signed)
Noted  
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
## 2018-11-24 ENCOUNTER — Telehealth: Payer: Self-pay | Admitting: Radiation Oncology

## 2018-11-24 ENCOUNTER — Telehealth: Payer: Self-pay | Admitting: Cardiology

## 2018-11-24 ENCOUNTER — Telehealth: Payer: Self-pay | Admitting: Internal Medicine

## 2018-11-24 ENCOUNTER — Other Ambulatory Visit: Payer: Self-pay | Admitting: *Deleted

## 2018-11-24 DIAGNOSIS — M4726 Other spondylosis with radiculopathy, lumbar region: Secondary | ICD-10-CM | POA: Diagnosis not present

## 2018-11-24 DIAGNOSIS — I110 Hypertensive heart disease with heart failure: Secondary | ICD-10-CM | POA: Diagnosis not present

## 2018-11-24 DIAGNOSIS — E11649 Type 2 diabetes mellitus with hypoglycemia without coma: Secondary | ICD-10-CM | POA: Diagnosis not present

## 2018-11-24 DIAGNOSIS — C3491 Malignant neoplasm of unspecified part of right bronchus or lung: Secondary | ICD-10-CM | POA: Diagnosis not present

## 2018-11-24 DIAGNOSIS — M47813 Spondylosis without myelopathy or radiculopathy, cervicothoracic region: Secondary | ICD-10-CM | POA: Diagnosis not present

## 2018-11-24 DIAGNOSIS — I5032 Chronic diastolic (congestive) heart failure: Secondary | ICD-10-CM | POA: Diagnosis not present

## 2018-11-24 DIAGNOSIS — D3500 Benign neoplasm of unspecified adrenal gland: Secondary | ICD-10-CM | POA: Diagnosis not present

## 2018-11-24 DIAGNOSIS — M4316 Spondylolisthesis, lumbar region: Secondary | ICD-10-CM | POA: Diagnosis not present

## 2018-11-24 DIAGNOSIS — D630 Anemia in neoplastic disease: Secondary | ICD-10-CM | POA: Diagnosis not present

## 2018-11-24 NOTE — Telephone Encounter (Signed)
I spoke with the patient's son in law today and we reviewed discussion from thoracic conference and that the nodes on PET imaging were not conclusive for disease and should be followed. Per Dr. Julien Nordmann she is not currently an appropriate candidate for chemotherapy either, and risks of further biopsy are felt to be higher than the benefit of a focused local therapy so she will be treated as Stage IIB. We will coordinate simulation in the near future while working with her cardiologist to obtain clearance to proceed given her ICD and follow up with her post treatment scans as well.     Carola Rhine, PAC

## 2018-11-24 NOTE — Telephone Encounter (Signed)
Neoma Laming, Education officer, museum from [place], Immunologist and is requesting to have the referral for the medicaid PCS due to the pt ineligibility for medicaid until 11/08/2018. Please advise.    [phone] secure line

## 2018-11-24 NOTE — Telephone Encounter (Signed)
Patient son in law called (DPR on file) and stated that pt is telling him that someone is calling her about her home monitor is not working. I informed patient son in law that patient home monitor is working properly. Pt son in law verbalized understanding.

## 2018-11-24 NOTE — Progress Notes (Signed)
The proposed treatment discussed in cancer conference 11/24/18 is for discussion purpose only and is not a binding recommendation.  The patient was not physically examined nor present for their treatment options.  Therefore, final treatment plans cannot be decided.

## 2018-11-24 NOTE — Telephone Encounter (Signed)
I dont know how to do this, maybe she needs to talk to Clayborn Bigness?

## 2018-11-25 ENCOUNTER — Telehealth: Payer: Self-pay

## 2018-11-25 NOTE — Telephone Encounter (Signed)
Attempted to call Carolyn [name] back for clarification on what she needs from the below message but received VM is full recording.

## 2018-11-25 NOTE — Telephone Encounter (Signed)
Copied from [place] [phone]. Topic: General - Other >> Nov 25, 2018  2:45 PM [name] wrote: Reason for CRM: pt is calling to let dr Jenny Reichmann know she was dx with lung ca and will be starting radiation treatment on 12-07-2018

## 2018-11-28 ENCOUNTER — Other Ambulatory Visit: Payer: Self-pay | Admitting: Internal Medicine

## 2018-11-28 DIAGNOSIS — D630 Anemia in neoplastic disease: Secondary | ICD-10-CM | POA: Diagnosis not present

## 2018-11-28 DIAGNOSIS — M4316 Spondylolisthesis, lumbar region: Secondary | ICD-10-CM | POA: Diagnosis not present

## 2018-11-28 DIAGNOSIS — C3491 Malignant neoplasm of unspecified part of right bronchus or lung: Secondary | ICD-10-CM | POA: Diagnosis not present

## 2018-11-28 DIAGNOSIS — E11649 Type 2 diabetes mellitus with hypoglycemia without coma: Secondary | ICD-10-CM | POA: Diagnosis not present

## 2018-11-28 DIAGNOSIS — M47813 Spondylosis without myelopathy or radiculopathy, cervicothoracic region: Secondary | ICD-10-CM | POA: Diagnosis not present

## 2018-11-28 DIAGNOSIS — M4726 Other spondylosis with radiculopathy, lumbar region: Secondary | ICD-10-CM | POA: Diagnosis not present

## 2018-11-28 DIAGNOSIS — I110 Hypertensive heart disease with heart failure: Secondary | ICD-10-CM | POA: Diagnosis not present

## 2018-11-28 DIAGNOSIS — D3500 Benign neoplasm of unspecified adrenal gland: Secondary | ICD-10-CM | POA: Diagnosis not present

## 2018-11-28 DIAGNOSIS — I5032 Chronic diastolic (congestive) heart failure: Secondary | ICD-10-CM | POA: Diagnosis not present

## 2018-11-28 NOTE — Telephone Encounter (Signed)
Done erx 

## 2018-11-29 DIAGNOSIS — M4316 Spondylolisthesis, lumbar region: Secondary | ICD-10-CM | POA: Diagnosis not present

## 2018-11-29 DIAGNOSIS — I110 Hypertensive heart disease with heart failure: Secondary | ICD-10-CM | POA: Diagnosis not present

## 2018-11-29 DIAGNOSIS — E11649 Type 2 diabetes mellitus with hypoglycemia without coma: Secondary | ICD-10-CM | POA: Diagnosis not present

## 2018-11-29 DIAGNOSIS — M47813 Spondylosis without myelopathy or radiculopathy, cervicothoracic region: Secondary | ICD-10-CM | POA: Diagnosis not present

## 2018-11-29 DIAGNOSIS — I5032 Chronic diastolic (congestive) heart failure: Secondary | ICD-10-CM | POA: Diagnosis not present

## 2018-11-29 DIAGNOSIS — D630 Anemia in neoplastic disease: Secondary | ICD-10-CM | POA: Diagnosis not present

## 2018-11-29 DIAGNOSIS — M4726 Other spondylosis with radiculopathy, lumbar region: Secondary | ICD-10-CM | POA: Diagnosis not present

## 2018-11-29 DIAGNOSIS — D3500 Benign neoplasm of unspecified adrenal gland: Secondary | ICD-10-CM | POA: Diagnosis not present

## 2018-11-29 DIAGNOSIS — C3491 Malignant neoplasm of unspecified part of right bronchus or lung: Secondary | ICD-10-CM | POA: Diagnosis not present

## 2018-11-30 ENCOUNTER — Other Ambulatory Visit: Payer: Self-pay

## 2018-11-30 ENCOUNTER — Ambulatory Visit
Admission: RE | Admit: 2018-11-30 | Discharge: 2018-11-30 | Disposition: A | Discharge status: Home / Self Care | Payer: Medicare HMO | Source: Ambulatory Visit | Attending: Radiation Oncology | Admitting: Radiation Oncology

## 2018-11-30 DIAGNOSIS — Z510 Encounter for antineoplastic radiation therapy: Secondary | ICD-10-CM | POA: Diagnosis not present

## 2018-11-30 DIAGNOSIS — C342 Malignant neoplasm of middle lobe, bronchus or lung: Secondary | ICD-10-CM | POA: Diagnosis not present

## 2018-11-30 DIAGNOSIS — C3491 Malignant neoplasm of unspecified part of right bronchus or lung: Secondary | ICD-10-CM

## 2018-11-30 DIAGNOSIS — C3481 Malignant neoplasm of overlapping sites of right bronchus and lung: Secondary | ICD-10-CM

## 2018-12-01 ENCOUNTER — Telehealth: Payer: Self-pay

## 2018-12-01 DIAGNOSIS — E11649 Type 2 diabetes mellitus with hypoglycemia without coma: Secondary | ICD-10-CM | POA: Diagnosis not present

## 2018-12-01 DIAGNOSIS — D630 Anemia in neoplastic disease: Secondary | ICD-10-CM | POA: Diagnosis not present

## 2018-12-01 DIAGNOSIS — M4316 Spondylolisthesis, lumbar region: Secondary | ICD-10-CM | POA: Diagnosis not present

## 2018-12-01 DIAGNOSIS — M4726 Other spondylosis with radiculopathy, lumbar region: Secondary | ICD-10-CM | POA: Diagnosis not present

## 2018-12-01 DIAGNOSIS — C3491 Malignant neoplasm of unspecified part of right bronchus or lung: Secondary | ICD-10-CM | POA: Diagnosis not present

## 2018-12-01 DIAGNOSIS — M47813 Spondylosis without myelopathy or radiculopathy, cervicothoracic region: Secondary | ICD-10-CM | POA: Diagnosis not present

## 2018-12-01 DIAGNOSIS — R269 Unspecified abnormalities of gait and mobility: Secondary | ICD-10-CM

## 2018-12-01 DIAGNOSIS — I5032 Chronic diastolic (congestive) heart failure: Secondary | ICD-10-CM | POA: Diagnosis not present

## 2018-12-01 DIAGNOSIS — I110 Hypertensive heart disease with heart failure: Secondary | ICD-10-CM | POA: Diagnosis not present

## 2018-12-01 DIAGNOSIS — D3500 Benign neoplasm of unspecified adrenal gland: Secondary | ICD-10-CM | POA: Diagnosis not present

## 2018-12-01 NOTE — Telephone Encounter (Signed)
Copied from [place] [id number]. Topic: General - Other >> Dec 01, 2018 12:39 PM Izola Price, [place] A wrote: Patients was given the wrong device. Her walker should have 4 wheels and the seat and her son would like Dr. Jenny Reichmann nurse to contact [facility] for order for the correct walker and also would like to speak with someone about patient getting assistance in her home.

## 2018-12-01 NOTE — Telephone Encounter (Signed)
Ok done again for second time

## 2018-12-01 NOTE — Addendum Note (Signed)
Addended by: Biagio Borg on: 12/01/2018 02:17 PM   Modules accepted: Orders

## 2018-12-01 NOTE — Telephone Encounter (Signed)
Pt is active with [facility]

## 2018-12-01 NOTE — Telephone Encounter (Signed)
Faxed to [facility]  

## 2018-12-01 NOTE — Telephone Encounter (Signed)
I spoke to [place] and informed him that PCP will correct the order for the rollator with seat and I will fax it to [facility].    I also informed him that I would follow up with the referral coordinators to see where they are in the process with the [facility] referral.   Lori/Mary-please follow up with Juanda Crumble. Thanks!     Copied from [place] [phone]. Topic: General - Other >> Dec 01, 2018 12:39 PM Izola Price, [place] A wrote: Patients was given the wrong device. Her walker should have 4 wheels and the seat and her son would like Dr. Jenny Reichmann nurse to contact [place] for order for the correct walker and also would like to speak with someone about patient getting assistance in her home.

## 2018-12-06 DIAGNOSIS — I110 Hypertensive heart disease with heart failure: Secondary | ICD-10-CM | POA: Diagnosis not present

## 2018-12-06 DIAGNOSIS — D3500 Benign neoplasm of unspecified adrenal gland: Secondary | ICD-10-CM | POA: Diagnosis not present

## 2018-12-06 DIAGNOSIS — D630 Anemia in neoplastic disease: Secondary | ICD-10-CM | POA: Diagnosis not present

## 2018-12-06 DIAGNOSIS — C3491 Malignant neoplasm of unspecified part of right bronchus or lung: Secondary | ICD-10-CM | POA: Diagnosis not present

## 2018-12-06 DIAGNOSIS — M4316 Spondylolisthesis, lumbar region: Secondary | ICD-10-CM | POA: Diagnosis not present

## 2018-12-06 DIAGNOSIS — M4726 Other spondylosis with radiculopathy, lumbar region: Secondary | ICD-10-CM | POA: Diagnosis not present

## 2018-12-06 DIAGNOSIS — E11649 Type 2 diabetes mellitus with hypoglycemia without coma: Secondary | ICD-10-CM | POA: Diagnosis not present

## 2018-12-06 DIAGNOSIS — I5032 Chronic diastolic (congestive) heart failure: Secondary | ICD-10-CM | POA: Diagnosis not present

## 2018-12-06 DIAGNOSIS — M47813 Spondylosis without myelopathy or radiculopathy, cervicothoracic region: Secondary | ICD-10-CM | POA: Diagnosis not present

## 2018-12-07 DIAGNOSIS — C342 Malignant neoplasm of middle lobe, bronchus or lung: Secondary | ICD-10-CM | POA: Diagnosis not present

## 2018-12-07 DIAGNOSIS — Z510 Encounter for antineoplastic radiation therapy: Secondary | ICD-10-CM | POA: Diagnosis not present

## 2018-12-08 ENCOUNTER — Telehealth: Payer: Self-pay

## 2018-12-08 NOTE — Progress Notes (Signed)
  Radiation Oncology         [phone] ________________________________  Name: Carolyn [name] MRN: [id number]  Date: 11/30/2018  DOB: 1940/04/18  RESPIRATORY MOTION MANAGEMENT SIMULATION  NARRATIVE:  In order to account for effect of respiratory motion on target structures and other organs in the planning and delivery of radiotherapy, this patient underwent respiratory motion management simulation.  To accomplish this, when the patient was brought to the CT simulation planning suite, 4D respiratoy motion management CT images were obtained.  The CT images were loaded into the planning software.  Then, using a variety of tools including Cine, MIP, and standard views, the target volume and planning target volumes (PTV) were delineated.  Avoidance structures were contoured.  Treatment planning then occurred.  Dose volume histograms were generated and reviewed for each of the requested structure.  The resulting plan was carefully reviewed and approved today.   ------------------------------------------------  Jodelle Gross, MD, PhD

## 2018-12-08 NOTE — Telephone Encounter (Signed)
Verbal given for supplies to be filled.    Copied from [place] [phone]. Topic: General - Inquiry >> Dec 08, 2018 12:12 PM Richardo Priest, NT wrote: Reason for CRM: Amber with [facility] called in stating she is needing to speak with CMA or PCP in regards to testing supplies request form they have sent on over through fax with no response. Please advise.

## 2018-12-08 NOTE — Progress Notes (Signed)
[facility] Simulation and Treatment Planning Note   Name:  Carolyn [name] MRN: [id number]   Date: 11/30/2018  DOB: 1940/09/05  Status:outpatient    DIAGNOSIS:    ICD-10-CM   1. Adenocarcinoma of right lung, stage 2 (HCC)  C34.91   2. Malignant neoplasm of overlapping sites of right bronchus and lung ([place])  C34.81      CONSENT VERIFIED:yes   SET UP: Patient is setup supine   IMMOBILIZATION: The patient was immobilized using a customized Vac Loc bag/ blue bag and customized accuform device   NARRATIVE:The patient was brought to the [facility].  Identity was confirmed.  All relevant records and images related to the planned course of therapy were reviewed.  Then, the patient was positioned in a stable reproducible clinical set-up for radiation therapy. Abdominal compression was applied by me.  4D CT images were obtained and reproducible breathing pattern was confirmed. Free breathing CT images were obtained.  Skin markings were placed.  The CT images were loaded into the planning software where the target and avoidance structures were contoured.  The radiation prescription was entered and confirmed.    TREATMENT PLANNING NOTE:  Treatment planning then occurred. I have requested : IMRT planning.This treatment technique is medically necessary due to the high-dose of radiation delivered to the target region which is in close proximity to adjacent critical normal structures.  3 dimensional simulation is performed and dose volume histogram of the gross tumor volume, planning tumor volume and criticial normal structures including the spinal cord and lungs were analyzed and requested.  Special treatment procedure was performed due to high dose per fraction.  The patient will be monitored for increased risk of toxicity.  Daily imaging using cone beam CT will be used for target localization.  I anticipate that the patient will receive 60 Gy  in 10 fractions to target volume. Further adjustments will be made based on the planning process is necessary.  ------------------------------------------------  Jodelle Gross, MD, PhD

## 2018-12-12 ENCOUNTER — Telehealth: Payer: Self-pay

## 2018-12-12 ENCOUNTER — Other Ambulatory Visit: Payer: Self-pay

## 2018-12-12 ENCOUNTER — Ambulatory Visit
Admission: RE | Admit: 2018-12-12 | Discharge: 2018-12-12 | Disposition: A | Discharge status: Home / Self Care | Payer: Medicare HMO | Source: Ambulatory Visit | Attending: Radiation Oncology | Admitting: Radiation Oncology

## 2018-12-12 DIAGNOSIS — Z510 Encounter for antineoplastic radiation therapy: Secondary | ICD-10-CM | POA: Diagnosis not present

## 2018-12-12 DIAGNOSIS — C342 Malignant neoplasm of middle lobe, bronchus or lung: Secondary | ICD-10-CM | POA: Insufficient documentation

## 2018-12-12 NOTE — Telephone Encounter (Signed)
Informed pt that I spoke to [facility] on 10/1 to do the verbal for her new meter and supplies.   Copied from [place] [phone]. Topic: General - Other >> Dec 12, 2018  4:18 PM Rainey Pines A wrote: Patient would like a callback from nurse in regards to [facility] verbal order for her lancets

## 2018-12-13 ENCOUNTER — Ambulatory Visit
Admission: RE | Admit: 2018-12-13 | Discharge: 2018-12-13 | Disposition: A | Discharge status: Home / Self Care | Payer: Medicare HMO | Source: Ambulatory Visit | Attending: Radiation Oncology | Admitting: Radiation Oncology

## 2018-12-13 DIAGNOSIS — Z510 Encounter for antineoplastic radiation therapy: Secondary | ICD-10-CM | POA: Diagnosis not present

## 2018-12-13 DIAGNOSIS — C342 Malignant neoplasm of middle lobe, bronchus or lung: Secondary | ICD-10-CM | POA: Diagnosis not present

## 2018-12-14 ENCOUNTER — Ambulatory Visit
Admission: RE | Admit: 2018-12-14 | Discharge: 2018-12-14 | Disposition: A | Discharge status: Home / Self Care | Payer: Medicare HMO | Source: Ambulatory Visit | Attending: Radiation Oncology | Admitting: Radiation Oncology

## 2018-12-14 ENCOUNTER — Other Ambulatory Visit: Payer: Self-pay

## 2018-12-14 DIAGNOSIS — C342 Malignant neoplasm of middle lobe, bronchus or lung: Secondary | ICD-10-CM | POA: Diagnosis not present

## 2018-12-14 DIAGNOSIS — I110 Hypertensive heart disease with heart failure: Secondary | ICD-10-CM | POA: Diagnosis not present

## 2018-12-14 DIAGNOSIS — C3491 Malignant neoplasm of unspecified part of right bronchus or lung: Secondary | ICD-10-CM | POA: Diagnosis not present

## 2018-12-14 DIAGNOSIS — D630 Anemia in neoplastic disease: Secondary | ICD-10-CM | POA: Diagnosis not present

## 2018-12-14 DIAGNOSIS — E11649 Type 2 diabetes mellitus with hypoglycemia without coma: Secondary | ICD-10-CM | POA: Diagnosis not present

## 2018-12-14 DIAGNOSIS — M47813 Spondylosis without myelopathy or radiculopathy, cervicothoracic region: Secondary | ICD-10-CM | POA: Diagnosis not present

## 2018-12-14 DIAGNOSIS — Z510 Encounter for antineoplastic radiation therapy: Secondary | ICD-10-CM | POA: Diagnosis not present

## 2018-12-14 DIAGNOSIS — I5032 Chronic diastolic (congestive) heart failure: Secondary | ICD-10-CM | POA: Diagnosis not present

## 2018-12-14 DIAGNOSIS — M4316 Spondylolisthesis, lumbar region: Secondary | ICD-10-CM | POA: Diagnosis not present

## 2018-12-14 DIAGNOSIS — D3500 Benign neoplasm of unspecified adrenal gland: Secondary | ICD-10-CM | POA: Diagnosis not present

## 2018-12-14 DIAGNOSIS — M4726 Other spondylosis with radiculopathy, lumbar region: Secondary | ICD-10-CM | POA: Diagnosis not present

## 2018-12-15 ENCOUNTER — Ambulatory Visit
Admission: RE | Admit: 2018-12-15 | Discharge: 2018-12-15 | Disposition: A | Discharge status: Home / Self Care | Payer: Medicare HMO | Source: Ambulatory Visit | Attending: Radiation Oncology | Admitting: Radiation Oncology

## 2018-12-15 ENCOUNTER — Other Ambulatory Visit: Payer: Self-pay

## 2018-12-15 DIAGNOSIS — Z510 Encounter for antineoplastic radiation therapy: Secondary | ICD-10-CM | POA: Diagnosis not present

## 2018-12-15 DIAGNOSIS — C342 Malignant neoplasm of middle lobe, bronchus or lung: Secondary | ICD-10-CM | POA: Diagnosis not present

## 2018-12-16 ENCOUNTER — Encounter: Payer: Medicare HMO | Admitting: Physician Assistant

## 2018-12-16 ENCOUNTER — Ambulatory Visit
Admission: RE | Admit: 2018-12-16 | Discharge: 2018-12-16 | Disposition: A | Discharge status: Home / Self Care | Payer: Medicare HMO | Source: Ambulatory Visit | Attending: Radiation Oncology | Admitting: Radiation Oncology

## 2018-12-16 ENCOUNTER — Other Ambulatory Visit: Payer: Self-pay

## 2018-12-16 DIAGNOSIS — Z510 Encounter for antineoplastic radiation therapy: Secondary | ICD-10-CM | POA: Diagnosis not present

## 2018-12-16 DIAGNOSIS — C342 Malignant neoplasm of middle lobe, bronchus or lung: Secondary | ICD-10-CM | POA: Diagnosis not present

## 2018-12-19 ENCOUNTER — Ambulatory Visit
Admission: RE | Admit: 2018-12-19 | Discharge: 2018-12-19 | Disposition: A | Discharge status: Home / Self Care | Payer: Medicare HMO | Source: Ambulatory Visit | Attending: Radiation Oncology | Admitting: Radiation Oncology

## 2018-12-19 ENCOUNTER — Other Ambulatory Visit: Payer: Self-pay

## 2018-12-19 DIAGNOSIS — C3491 Malignant neoplasm of unspecified part of right bronchus or lung: Secondary | ICD-10-CM | POA: Diagnosis not present

## 2018-12-19 DIAGNOSIS — M47813 Spondylosis without myelopathy or radiculopathy, cervicothoracic region: Secondary | ICD-10-CM | POA: Diagnosis not present

## 2018-12-19 DIAGNOSIS — I5032 Chronic diastolic (congestive) heart failure: Secondary | ICD-10-CM | POA: Diagnosis not present

## 2018-12-19 DIAGNOSIS — D630 Anemia in neoplastic disease: Secondary | ICD-10-CM | POA: Diagnosis not present

## 2018-12-19 DIAGNOSIS — M4316 Spondylolisthesis, lumbar region: Secondary | ICD-10-CM | POA: Diagnosis not present

## 2018-12-19 DIAGNOSIS — Z510 Encounter for antineoplastic radiation therapy: Secondary | ICD-10-CM | POA: Diagnosis not present

## 2018-12-19 DIAGNOSIS — M4726 Other spondylosis with radiculopathy, lumbar region: Secondary | ICD-10-CM | POA: Diagnosis not present

## 2018-12-19 DIAGNOSIS — E11649 Type 2 diabetes mellitus with hypoglycemia without coma: Secondary | ICD-10-CM | POA: Diagnosis not present

## 2018-12-19 DIAGNOSIS — I110 Hypertensive heart disease with heart failure: Secondary | ICD-10-CM | POA: Diagnosis not present

## 2018-12-19 DIAGNOSIS — C342 Malignant neoplasm of middle lobe, bronchus or lung: Secondary | ICD-10-CM | POA: Diagnosis not present

## 2018-12-19 DIAGNOSIS — D3500 Benign neoplasm of unspecified adrenal gland: Secondary | ICD-10-CM | POA: Diagnosis not present

## 2018-12-20 ENCOUNTER — Ambulatory Visit
Admission: RE | Admit: 2018-12-20 | Discharge: 2018-12-20 | Disposition: A | Discharge status: Home / Self Care | Payer: Medicare HMO | Source: Ambulatory Visit | Attending: Radiation Oncology | Admitting: Radiation Oncology

## 2018-12-20 ENCOUNTER — Other Ambulatory Visit: Payer: Self-pay

## 2018-12-20 DIAGNOSIS — Z510 Encounter for antineoplastic radiation therapy: Secondary | ICD-10-CM | POA: Diagnosis not present

## 2018-12-20 DIAGNOSIS — C342 Malignant neoplasm of middle lobe, bronchus or lung: Secondary | ICD-10-CM | POA: Diagnosis not present

## 2018-12-21 ENCOUNTER — Other Ambulatory Visit: Payer: Self-pay

## 2018-12-21 ENCOUNTER — Ambulatory Visit
Admission: RE | Admit: 2018-12-21 | Discharge: 2018-12-21 | Disposition: A | Discharge status: Home / Self Care | Payer: Medicare HMO | Source: Ambulatory Visit | Attending: Radiation Oncology | Admitting: Radiation Oncology

## 2018-12-21 DIAGNOSIS — Z510 Encounter for antineoplastic radiation therapy: Secondary | ICD-10-CM | POA: Diagnosis not present

## 2018-12-21 DIAGNOSIS — C342 Malignant neoplasm of middle lobe, bronchus or lung: Secondary | ICD-10-CM | POA: Diagnosis not present

## 2018-12-22 ENCOUNTER — Other Ambulatory Visit: Payer: Self-pay

## 2018-12-22 ENCOUNTER — Ambulatory Visit
Admission: RE | Admit: 2018-12-22 | Discharge: 2018-12-22 | Disposition: A | Discharge status: Home / Self Care | Payer: Medicare HMO | Source: Ambulatory Visit | Attending: Radiation Oncology | Admitting: Radiation Oncology

## 2018-12-22 DIAGNOSIS — C3491 Malignant neoplasm of unspecified part of right bronchus or lung: Secondary | ICD-10-CM | POA: Diagnosis not present

## 2018-12-22 DIAGNOSIS — I110 Hypertensive heart disease with heart failure: Secondary | ICD-10-CM | POA: Diagnosis not present

## 2018-12-22 DIAGNOSIS — M47813 Spondylosis without myelopathy or radiculopathy, cervicothoracic region: Secondary | ICD-10-CM | POA: Diagnosis not present

## 2018-12-22 DIAGNOSIS — E11649 Type 2 diabetes mellitus with hypoglycemia without coma: Secondary | ICD-10-CM | POA: Diagnosis not present

## 2018-12-22 DIAGNOSIS — M4316 Spondylolisthesis, lumbar region: Secondary | ICD-10-CM | POA: Diagnosis not present

## 2018-12-22 DIAGNOSIS — M4726 Other spondylosis with radiculopathy, lumbar region: Secondary | ICD-10-CM | POA: Diagnosis not present

## 2018-12-22 DIAGNOSIS — D630 Anemia in neoplastic disease: Secondary | ICD-10-CM | POA: Diagnosis not present

## 2018-12-22 DIAGNOSIS — I5032 Chronic diastolic (congestive) heart failure: Secondary | ICD-10-CM | POA: Diagnosis not present

## 2018-12-22 DIAGNOSIS — Z510 Encounter for antineoplastic radiation therapy: Secondary | ICD-10-CM | POA: Diagnosis not present

## 2018-12-22 DIAGNOSIS — C342 Malignant neoplasm of middle lobe, bronchus or lung: Secondary | ICD-10-CM | POA: Diagnosis not present

## 2018-12-22 DIAGNOSIS — D3500 Benign neoplasm of unspecified adrenal gland: Secondary | ICD-10-CM | POA: Diagnosis not present

## 2018-12-23 ENCOUNTER — Other Ambulatory Visit: Payer: Self-pay

## 2018-12-23 ENCOUNTER — Encounter: Payer: Self-pay | Admitting: Radiation Oncology

## 2018-12-23 ENCOUNTER — Ambulatory Visit
Admission: RE | Admit: 2018-12-23 | Discharge: 2018-12-23 | Disposition: A | Discharge status: Home / Self Care | Payer: Medicare HMO | Source: Ambulatory Visit | Attending: Radiation Oncology | Admitting: Radiation Oncology

## 2018-12-23 DIAGNOSIS — C342 Malignant neoplasm of middle lobe, bronchus or lung: Secondary | ICD-10-CM | POA: Diagnosis not present

## 2018-12-23 DIAGNOSIS — Z510 Encounter for antineoplastic radiation therapy: Secondary | ICD-10-CM | POA: Diagnosis not present

## 2018-12-28 ENCOUNTER — Ambulatory Visit (INDEPENDENT_AMBULATORY_CARE_PROVIDER_SITE_OTHER): Payer: Medicare HMO | Admitting: Cardiovascular Disease

## 2018-12-28 ENCOUNTER — Encounter: Payer: Self-pay | Admitting: Cardiovascular Disease

## 2018-12-28 ENCOUNTER — Other Ambulatory Visit: Payer: Self-pay

## 2018-12-28 VITALS — BP 128/62 | HR 85 | Ht 60.0 in | Wt 167.4 lb

## 2018-12-28 DIAGNOSIS — I428 Other cardiomyopathies: Secondary | ICD-10-CM | POA: Diagnosis not present

## 2018-12-28 DIAGNOSIS — M4726 Other spondylosis with radiculopathy, lumbar region: Secondary | ICD-10-CM | POA: Diagnosis not present

## 2018-12-28 DIAGNOSIS — D630 Anemia in neoplastic disease: Secondary | ICD-10-CM | POA: Diagnosis not present

## 2018-12-28 DIAGNOSIS — I10 Essential (primary) hypertension: Secondary | ICD-10-CM | POA: Diagnosis not present

## 2018-12-28 DIAGNOSIS — I110 Hypertensive heart disease with heart failure: Secondary | ICD-10-CM | POA: Diagnosis not present

## 2018-12-28 DIAGNOSIS — E11649 Type 2 diabetes mellitus with hypoglycemia without coma: Secondary | ICD-10-CM | POA: Diagnosis not present

## 2018-12-28 DIAGNOSIS — Z720 Tobacco use: Secondary | ICD-10-CM

## 2018-12-28 DIAGNOSIS — M4316 Spondylolisthesis, lumbar region: Secondary | ICD-10-CM | POA: Diagnosis not present

## 2018-12-28 DIAGNOSIS — D3500 Benign neoplasm of unspecified adrenal gland: Secondary | ICD-10-CM | POA: Diagnosis not present

## 2018-12-28 DIAGNOSIS — I519 Heart disease, unspecified: Secondary | ICD-10-CM | POA: Diagnosis not present

## 2018-12-28 DIAGNOSIS — C3491 Malignant neoplasm of unspecified part of right bronchus or lung: Secondary | ICD-10-CM | POA: Diagnosis not present

## 2018-12-28 DIAGNOSIS — M47813 Spondylosis without myelopathy or radiculopathy, cervicothoracic region: Secondary | ICD-10-CM | POA: Diagnosis not present

## 2018-12-28 DIAGNOSIS — I5032 Chronic diastolic (congestive) heart failure: Secondary | ICD-10-CM | POA: Diagnosis not present

## 2018-12-28 NOTE — Patient Instructions (Signed)
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
## 2019-01-03 ENCOUNTER — Encounter: Payer: Medicare HMO | Admitting: Physician Assistant

## 2019-01-05 ENCOUNTER — Other Ambulatory Visit: Payer: Self-pay | Admitting: Internal Medicine
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
## 2019-01-10 ENCOUNTER — Other Ambulatory Visit: Payer: Self-pay

## 2019-01-10 ENCOUNTER — Ambulatory Visit (INDEPENDENT_AMBULATORY_CARE_PROVIDER_SITE_OTHER): Payer: Medicare HMO | Admitting: Physician Assistant

## 2019-01-10 VITALS — BP 126/66 | HR 88 | Ht 61.0 in | Wt 166.0 lb

## 2019-01-10 DIAGNOSIS — I5032 Chronic diastolic (congestive) heart failure: Secondary | ICD-10-CM | POA: Diagnosis not present

## 2019-01-10 DIAGNOSIS — I428 Other cardiomyopathies: Secondary | ICD-10-CM

## 2019-01-10 DIAGNOSIS — I519 Heart disease, unspecified: Secondary | ICD-10-CM

## 2019-01-10 DIAGNOSIS — Z95810 Presence of automatic (implantable) cardiac defibrillator: Secondary | ICD-10-CM | POA: Diagnosis not present

## 2019-01-10 DIAGNOSIS — F17200 Nicotine dependence, unspecified, uncomplicated: Secondary | ICD-10-CM | POA: Diagnosis not present

## 2019-01-10 DIAGNOSIS — I10 Essential (primary) hypertension: Secondary | ICD-10-CM | POA: Diagnosis not present

## 2019-01-10 NOTE — Patient Instructions (Addendum)
Medication Instructions:   Your physician recommends that you continue on your current medications as directed. Please refer to the Current Medication list given to you today.  *If you need a refill on your cardiac medications before your next appointment, please call your pharmacy*  Lab Work: [place]   If you have labs (blood work) drawn today and your tests are completely normal, you will receive your results only by: Marland Kitchen MyChart Message (if you have MyChart) OR . A paper copy in the mail If you have any lab test that is abnormal or we need to change your treatment, we will call you to review the results.  Testing/Procedures: NONE ORDERED  TODAY  Follow-Up: At [facility], you and your health needs are our priority.  As part of our continuing mission to provide you with exceptional heart care, we have created designated Provider Care Teams.  These Care Teams include your primary Cardiologist (physician) and Advanced Practice Providers (APPs -  Physician Assistants and Nurse Practitioners) who all work together to provide you with the care you need, when you need it.  Your next appointment:   12 months  The format for your next appointment:   In Person  Provider:   You may see Dr. Rayann Heman  or one of the following Advanced Practice Providers on your designated Care Team:    Chanetta Marshall, NP  Tommye Standard, PA-C  Legrand Como "Oda Kilts, [place]  Other Instructions

## 2019-01-16 ENCOUNTER — Ambulatory Visit (INDEPENDENT_AMBULATORY_CARE_PROVIDER_SITE_OTHER): Payer: Medicare HMO | Admitting: *Deleted

## 2019-01-16 DIAGNOSIS — I447 Left bundle-branch block, unspecified: Secondary | ICD-10-CM

## 2019-01-16 DIAGNOSIS — I5032 Chronic diastolic (congestive) heart failure: Secondary | ICD-10-CM

## 2019-01-17 ENCOUNTER — Ambulatory Visit (INDEPENDENT_AMBULATORY_CARE_PROVIDER_SITE_OTHER): Payer: Medicare HMO

## 2019-01-17 ENCOUNTER — Telehealth: Payer: Self-pay

## 2019-01-17 DIAGNOSIS — Z95810 Presence of automatic (implantable) cardiac defibrillator: Secondary | ICD-10-CM | POA: Diagnosis not present

## 2019-01-17 DIAGNOSIS — I5032 Chronic diastolic (congestive) heart failure: Secondary | ICD-10-CM

## 2019-01-17 LAB — CUP PACEART REMOTE DEVICE CHECK
Battery Remaining Longevity: 40 mo
Battery Remaining Percentage: 48 %
Battery Voltage: 2.96 V
Brady Statistic AP VP Percent: 1 %
Brady Statistic AP VS Percent: 0 %
Brady Statistic AS VP Percent: 99 %
Brady Statistic AS VS Percent: 1 %
Brady Statistic RA Percent Paced: 1 %
Date Time Interrogation Session: 20201109071327
HighPow Impedance: 62 Ohm
HighPow Impedance: 62 Ohm
Implantable Lead Implant Date: 20100930
Implantable Lead Implant Date: 20100930
Implantable Lead Implant Date: 20100930
Implantable Lead Location: 753858
Implantable Lead Location: 753859
Implantable Lead Location: 753860
Implantable Lead Model: 7122
Implantable Pulse Generator Implant Date: 20161215
Lead Channel Impedance Value: 340 Ohm
Lead Channel Impedance Value: 530 Ohm
Lead Channel Impedance Value: 640 Ohm
Lead Channel Pacing Threshold Amplitude: 0.5 V
Lead Channel Pacing Threshold Amplitude: 0.75 V
Lead Channel Pacing Threshold Amplitude: 1 V
Lead Channel Pacing Threshold Pulse Width: 0.5 ms
Lead Channel Pacing Threshold Pulse Width: 0.5 ms
Lead Channel Pacing Threshold Pulse Width: 0.5 ms
Lead Channel Sensing Intrinsic Amplitude: 1.5 mV
Lead Channel Sensing Intrinsic Amplitude: 11.7 mV
Lead Channel Setting Pacing Amplitude: 2 V
Lead Channel Setting Pacing Amplitude: 2 V
Lead Channel Setting Pacing Amplitude: 2.5 V
Lead Channel Setting Pacing Pulse Width: 0.5 ms
Lead Channel Setting Pacing Pulse Width: 0.5 ms
Lead Channel Setting Sensing Sensitivity: 0.5 mV
Pulse Gen Serial Number: 7247188

## 2019-01-17 NOTE — Telephone Encounter (Signed)
Remote ICM transmission received.  Attempted call to patient regarding ICM remote transmission and left detailed message per DPR to return call.  Advised to return call for any fluid symptoms or questions.      

## 2019-01-17 NOTE — Progress Notes (Signed)
EPIC Encounter for ICM Monitoring  Patient Name: Carolyn [name] is a 78 y.o. female Date: 01/17/2019 Primary Care Physican: Biagio Borg, MD Primary Cardiologist: Angelena Form Electrophysiologist: Allred Bi-V Pacing: >99%% 01/10/2019 Weight:166lbs   Attempted call to patient and unable to reach.  Left  message to return call per DPR regarding transmission. Transmission reviewed.   CorVueThoracic impedance suggestive of possible ongoing fluid accumulation since 01/13/2019.  Prescribed:Furosemide 20 mg take 1tablet daily.   Labs: 11/04/2018 Creatinine 0.86, BUN 21, Potassium 3.8, Sodium 137, GFR >60 10/27/2018 Creatinine 0.81, BUN 14, Potassium 4.1, Sodium 137, GFR >60  10/26/2018 Creatinine 0.87, BUN 12, Potassium 4.0, Sodium 137, GFR >60  10/25/2018 Creatinine 0.84, BUN 9, Potassium 3.9, Sodium 140, GFR >60  10/24/2018 Creatinine 0.92, BUN 10, Potassium 4.0, Sodium 142, GFR >60  10/23/2018 Creatinine 1.41, BUN 18, Potassium 4.0, Sodium 134, GFR 36-42  10/22/2018 Creatinine 0.94, BUN 13, Potassium 3.5, Sodium 134, GFR 59->60  10/21/2018 Creatinine 0.81, BUN 14, Potassium 3.7, Sodium 137, GFR >60  A complete set of results can be found in Results Review.  Recommendations: Left voice mail with ICM number and encouraged to call if experiencing any fluid symptoms.  Follow-up plan: ICM clinic phone appointment on 01/25/2019 to recheck fluid levels.   91 day device clinic remote transmission 04/17/2019.    Copy of ICM check sent to Dr. Rayann Heman and Dr Angelena Form.   3 month ICM trend: 01/16/2019    1 Year ICM trend:       Rosalene Billings, RN 01/17/2019 9:17 AM

## 2019-01-21 ENCOUNTER — Other Ambulatory Visit: Payer: Self-pay | Admitting: Internal Medicine

## 2019-01-23 ENCOUNTER — Telehealth: Payer: Self-pay | Admitting: Radiation Oncology

## 2019-01-23 NOTE — Telephone Encounter (Signed)
  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  Interval Since Last Radiation: 5 weeks   12/12/2018-12/23/2018: The RML target was treated to 60 Gy in 10 fractions with SBRT style therapy.  Narrative:  The patient was contacted today for routine follow-up. During treatment she did very well with radiotherapy and did not have significant desquamation.   Impression/Plan: 1. 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 The patient has been doing well since completion of radiotherapy. I left a message for her son in law Juanda Crumble who was instrumental in our visits with her, and I discussed that we would be happy to continue to follow her as needed, but she will also continue to follow up with Dr. Julien Nordmann in medical oncology in January 2021. I encouraged him to call us back as well if they would like to discuss her case.     Carola Rhine, PAC

## 2019-01-23 NOTE — Telephone Encounter (Signed)
[place] Controlled Database Checked Last filled: 12/28/18 #120 LOV w/you: 11/07/18 Next appt w/you: 02/06/19

## 2019-01-23 NOTE — Telephone Encounter (Signed)
Done erx 

## 2019-01-25 ENCOUNTER — Ambulatory Visit (INDEPENDENT_AMBULATORY_CARE_PROVIDER_SITE_OTHER): Payer: Medicare HMO

## 2019-01-25 DIAGNOSIS — Z95810 Presence of automatic (implantable) cardiac defibrillator: Secondary | ICD-10-CM

## 2019-01-25 DIAGNOSIS — I5032 Chronic diastolic (congestive) heart failure: Secondary | ICD-10-CM

## 2019-01-26 NOTE — Progress Notes (Signed)
  Radiation Oncology         [phone] ________________________________  Name: Carolyn [name] MRN: [id number]  Date: 12/23/2018  DOB: Aug 29, 1940  End of Treatment Note  Diagnosis:   Right lung NSCLC     Indication for treatment::  curative       Radiation treatment dates:   12/12/18 - 12/23/18  Site/dose:   The right lung was treated to a dose of 60 Gy in 10 fractions using a 3-field IMRT technique.  Narrative: The patient tolerated radiation treatment relatively well.     Plan: The patient has completed radiation treatment. The patient will return to radiation oncology clinic for routine followup in one month. I advised the patient to call or return sooner if they have any questions or concerns related to their recovery or treatment. ________________________________  Jodelle Gross, M.D., Ph.D.

## 2019-01-27 NOTE — Progress Notes (Signed)
EPIC Encounter for ICM Monitoring  Patient Name: Carolyn [name] is a 78 y.o. female Date: 01/27/2019 Primary Care Physican: Biagio Borg, MD Electrophysiologist: Allred Bi-V Pacing: >99%% 01/10/2019 Weight:166lbs   Transmission reviewed.   CorVueThoracic impedance returned to normal.  Prescribed:Furosemide 20 mg take 1tablet daily.   Labs: 11/04/2018 Creatinine 0.86, BUN 21, Potassium 3.8, Sodium 137, GFR >60 10/27/2018 Creatinine 0.81, BUN 14, Potassium 4.1, Sodium 137, GFR >60  10/26/2018 Creatinine 0.87, BUN 12, Potassium 4.0, Sodium 137, GFR >60  10/25/2018 Creatinine 0.84, BUN 9, Potassium 3.9, Sodium 140, GFR >60  10/24/2018 Creatinine 0.92, BUN 10, Potassium 4.0, Sodium 142, GFR >60  10/23/2018 Creatinine 1.41, BUN 18, Potassium 4.0, Sodium 134, GFR 36-42  10/22/2018 Creatinine 0.94, BUN 13, Potassium 3.5, Sodium 134, GFR 59->60  10/21/2018 Creatinine 0.81, BUN 14, Potassium 3.7, Sodium 137, GFR >60  A complete set of results can be found in Results Review.  Recommendations:  None  Follow-up plan: ICM clinic phone appointment on 02/20/2019.   91 day device clinic remote transmission 04/17/2019.    Copy of ICM check sent to Dr. Rayann Heman.   3 month ICM trend: 01/25/2019    1 Year ICM trend:       Rosalene Billings, RN 01/27/2019 5:19 PM

## 2019-02-06 ENCOUNTER — Ambulatory Visit (INDEPENDENT_AMBULATORY_CARE_PROVIDER_SITE_OTHER): Payer: Medicare HMO | Admitting: Internal Medicine

## 2019-02-06 ENCOUNTER — Encounter: Payer: Self-pay | Admitting: Internal Medicine

## 2019-02-06 ENCOUNTER — Other Ambulatory Visit: Payer: Self-pay

## 2019-02-06 ENCOUNTER — Other Ambulatory Visit (INDEPENDENT_AMBULATORY_CARE_PROVIDER_SITE_OTHER): Payer: Medicare HMO

## 2019-02-06 VITALS — BP 150/80 | HR 81 | Temp 98.3°F | Ht 61.0 in | Wt 167.0 lb

## 2019-02-06 DIAGNOSIS — I5032 Chronic diastolic (congestive) heart failure: Secondary | ICD-10-CM | POA: Diagnosis not present

## 2019-02-06 DIAGNOSIS — E119 Type 2 diabetes mellitus without complications: Secondary | ICD-10-CM

## 2019-02-06 DIAGNOSIS — E611 Iron deficiency: Secondary | ICD-10-CM

## 2019-02-06 DIAGNOSIS — E785 Hyperlipidemia, unspecified: Secondary | ICD-10-CM

## 2019-02-06 DIAGNOSIS — I10 Essential (primary) hypertension: Secondary | ICD-10-CM | POA: Diagnosis not present

## 2019-02-06 DIAGNOSIS — E538 Deficiency of other specified B group vitamins: Secondary | ICD-10-CM

## 2019-02-06 DIAGNOSIS — Z794 Long term (current) use of insulin: Secondary | ICD-10-CM | POA: Diagnosis not present

## 2019-02-06 DIAGNOSIS — E559 Vitamin D deficiency, unspecified: Secondary | ICD-10-CM

## 2019-02-06 DIAGNOSIS — E039 Hypothyroidism, unspecified: Secondary | ICD-10-CM

## 2019-02-06 DIAGNOSIS — Z0000 Encounter for general adult medical examination without abnormal findings: Secondary | ICD-10-CM

## 2019-02-06 LAB — HEPATIC FUNCTION PANEL
ALT: 14 U/L (ref 0–35)
AST: 16 U/L (ref 0–37)
Albumin: 3.9 g/dL (ref 3.5–5.2)
Alkaline Phosphatase: 51 U/L (ref 39–117)
Bilirubin, Direct: 0 mg/dL (ref 0.0–0.3)
Total Bilirubin: 0.4 mg/dL (ref 0.2–1.2)
Total Protein: 7.4 g/dL (ref 6.0–8.3)

## 2019-02-06 LAB — LIPID PANEL
Cholesterol: 158 mg/dL (ref 0–200)
HDL: 46.8 mg/dL (ref >39.00)
LDL Cholesterol: 88 mg/dL (ref 0–99)
NonHDL: 111.53
Total CHOL/HDL Ratio: 3
Triglycerides: 117 mg/dL (ref 0.0–149.0)
VLDL: 23.4 mg/dL (ref 0.0–40.0)

## 2019-02-06 LAB — BASIC METABOLIC PANEL
BUN: 21 mg/dL (ref 6–23)
CO2: 28 mEq/L (ref 19–32)
Calcium: 9.6 mg/dL (ref 8.4–10.5)
Chloride: 102 mEq/L (ref 96–112)
Creatinine, Ser: 0.84 mg/dL (ref 0.40–1.20)
GFR: 65.71 mL/min (ref >60.00)
Glucose, Bld: 85 mg/dL (ref 70–99)
Potassium: 4.2 mEq/L (ref 3.5–5.1)
Sodium: 137 mEq/L (ref 135–145)

## 2019-02-06 LAB — IBC PANEL
Iron: 70 ug/dL (ref 42–145)
Saturation Ratios: 17.7 % — ABNORMAL LOW (ref 20.0–50.0)
Transferrin: 282 mg/dL (ref 212.0–360.0)

## 2019-02-06 LAB — HEMOGLOBIN A1C: Hgb A1c MFr Bld: 6.9 % — ABNORMAL HIGH (ref 4.6–6.5)

## 2019-02-06 MED ORDER — PIOGLITAZONE HCL 30 MG PO TABS: 30.0000 mg | ORAL_TABLET | Freq: Every day | ORAL | 3 refills | Status: DC

## 2019-02-06 NOTE — Assessment & Plan Note (Signed)
stable overall by history and exam, recent data reviewed with pt, and pt to continue medical treatment as before,  to f/u any worsening symptoms or concerns  
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
## 2019-02-06 NOTE — Patient Instructions (Addendum)
Keith for the actos at 30 mg per day (and a new prescription was sent today)  Please continue all other medications as before, and refills have been done if requested.  Please have the pharmacy call with any other refills you may need.  Please continue your efforts at being more active, low cholesterol diet, and weight control  Please keep your appointments with your specialists as you may have planned  Please go to the LAB in the Basement (turn left off the elevator) for the tests to be done today  You will be contacted by phone if any changes need to be made immediately.  Otherwise, you will receive a letter about your results with an explanation, but please check with MyChart first.  Please remember to sign up for MyChart if you have not done so, as this will be important to you in the future with finding out test results, communicating by private email, and scheduling acute appointments online when needed.  Please return in 6 months, or sooner if needed, with Lab testing done 3-5 days before

## 2019-02-07 ENCOUNTER — Other Ambulatory Visit: Payer: Self-pay | Admitting: Internal Medicine

## 2019-02-07 LAB — VITAMIN D 25 HYDROXY (VIT D DEFICIENCY, FRACTURES): VITD: 17.22 ng/mL — ABNORMAL LOW (ref 30.00–100.00)

## 2019-02-07 LAB — VITAMIN B12: Vitamin B-12: 281 pg/mL (ref 211–911)

## 2019-02-07 LAB — TSH: TSH: 1.51 u[IU]/mL (ref 0.35–4.50)

## 2019-02-07 MED ORDER — VITAMIN D (ERGOCALCIFEROL) 1.25 MG (50000 UNIT) PO CAPS: 50000.0000 [IU] | ORAL_CAPSULE | ORAL | 0 refills | Status: DC

## 2019-02-15 ENCOUNTER — Encounter: Payer: Self-pay | Admitting: *Deleted

## 2019-02-15 NOTE — Progress Notes (Signed)
Remote ICD transmission.   

## 2019-02-19 ENCOUNTER — Other Ambulatory Visit: Payer: Self-pay | Admitting: Internal Medicine

## 2019-02-20 ENCOUNTER — Telehealth: Payer: Self-pay

## 2019-02-20 ENCOUNTER — Ambulatory Visit (INDEPENDENT_AMBULATORY_CARE_PROVIDER_SITE_OTHER): Payer: Medicare HMO

## 2019-02-20 DIAGNOSIS — Z95810 Presence of automatic (implantable) cardiac defibrillator: Secondary | ICD-10-CM

## 2019-02-20 DIAGNOSIS — I5032 Chronic diastolic (congestive) heart failure: Secondary | ICD-10-CM

## 2019-02-20 NOTE — Telephone Encounter (Signed)
Remote ICM transmission received.  Attempted call to patient regarding ICM remote transmission and left detailed message per DPR to return call.  Advised to return call for any fluid symptoms or questions.      

## 2019-02-20 NOTE — Progress Notes (Signed)
EPIC Encounter for ICM Monitoring  Patient Name: Carolyn [name] is a 78 y.o. female Date: 02/20/2019 Primary Care Physican: Biagio Borg, MD Primary Cardiologist: Angelena Form Electrophysiologist: Allred Bi-V Pacing: >99%% 11/03/2020Weight:166lbs   Attempted call to patient and unable to reach.  Left detailed message per DPR regarding transmission. Transmission reviewed.   CorVueThoracic impedancereturned to normal.  Prescribed:Furosemide 20 mgtake1tablet daily.   Labs: 02/06/2019 Creatinine 0.84, BUN 21, Potassium 4.2, Sodium 137, GFR 65.71 11/04/2018 Creatinine0.86, BUN21, Potassium3.8, Sodium137, GFR>60 10/27/2018 Creatinine0.81, BUN14, Potassium4.1, Sodium137, GFR>60  10/26/2018 Creatinine0.87, BUN12, Potassium4.0, Sodium137, GFR>60  10/25/2018 Creatinine0.84, BUN9, Potassium3.9, Sodium140, GFR>60  A complete set of results can be found in Results Review.  Recommendations: Left voice mail with ICM number and encouraged to call if experiencing any fluid symptoms.  Follow-up plan: ICM clinic phone appointment on 02/28/2019 to recheck fluid levels.   91 day device clinic remote transmission 04/17/2019.    Copy of ICM check sent to Dr. Rayann Heman and Dr Angelena Form.   3 month ICM trend: 02/20/2019    1 Year ICM trend:       Rosalene Billings, RN 02/20/2019 9:46 AM

## 2019-02-22 ENCOUNTER — Other Ambulatory Visit: Payer: Self-pay

## 2019-02-22 ENCOUNTER — Emergency Department (HOSPITAL_BASED_OUTPATIENT_CLINIC_OR_DEPARTMENT_OTHER)
Admission: EM | Admit: 2019-02-22 | Discharge: 2019-02-22 | Disposition: A | Discharge status: Home / Self Care | Payer: Medicare HMO | Attending: Emergency Medicine | Admitting: Emergency Medicine

## 2019-02-22 ENCOUNTER — Emergency Department (HOSPITAL_BASED_OUTPATIENT_CLINIC_OR_DEPARTMENT_OTHER): Payer: Medicare HMO

## 2019-02-22 ENCOUNTER — Encounter (HOSPITAL_BASED_OUTPATIENT_CLINIC_OR_DEPARTMENT_OTHER): Payer: Self-pay | Admitting: Emergency Medicine

## 2019-02-22 DIAGNOSIS — Z85118 Personal history of other malignant neoplasm of bronchus and lung: Secondary | ICD-10-CM | POA: Diagnosis not present

## 2019-02-22 DIAGNOSIS — Z79899 Other long term (current) drug therapy: Secondary | ICD-10-CM | POA: Diagnosis not present

## 2019-02-22 DIAGNOSIS — I110 Hypertensive heart disease with heart failure: Secondary | ICD-10-CM | POA: Insufficient documentation

## 2019-02-22 DIAGNOSIS — Z881 Allergy status to other antibiotic agents status: Secondary | ICD-10-CM | POA: Diagnosis not present

## 2019-02-22 DIAGNOSIS — R911 Solitary pulmonary nodule: Secondary | ICD-10-CM | POA: Diagnosis not present

## 2019-02-22 DIAGNOSIS — E039 Hypothyroidism, unspecified: Secondary | ICD-10-CM | POA: Diagnosis not present

## 2019-02-22 DIAGNOSIS — R062 Wheezing: Secondary | ICD-10-CM | POA: Diagnosis not present

## 2019-02-22 DIAGNOSIS — Z888 Allergy status to other drugs, medicaments and biological substances status: Secondary | ICD-10-CM | POA: Diagnosis not present

## 2019-02-22 DIAGNOSIS — E119 Type 2 diabetes mellitus without complications: Secondary | ICD-10-CM | POA: Diagnosis not present

## 2019-02-22 DIAGNOSIS — E785 Hyperlipidemia, unspecified: Secondary | ICD-10-CM | POA: Diagnosis not present

## 2019-02-22 DIAGNOSIS — Z7982 Long term (current) use of aspirin: Secondary | ICD-10-CM | POA: Diagnosis not present

## 2019-02-22 DIAGNOSIS — R0602 Shortness of breath: Secondary | ICD-10-CM | POA: Diagnosis not present

## 2019-02-22 DIAGNOSIS — Z794 Long term (current) use of insulin: Secondary | ICD-10-CM | POA: Insufficient documentation

## 2019-02-22 DIAGNOSIS — R918 Other nonspecific abnormal finding of lung field: Secondary | ICD-10-CM

## 2019-02-22 DIAGNOSIS — Z882 Allergy status to sulfonamides status: Secondary | ICD-10-CM | POA: Insufficient documentation

## 2019-02-22 DIAGNOSIS — F17210 Nicotine dependence, cigarettes, uncomplicated: Secondary | ICD-10-CM | POA: Insufficient documentation

## 2019-02-22 DIAGNOSIS — I5042 Chronic combined systolic (congestive) and diastolic (congestive) heart failure: Secondary | ICD-10-CM | POA: Diagnosis not present

## 2019-02-22 DIAGNOSIS — M25512 Pain in left shoulder: Secondary | ICD-10-CM | POA: Insufficient documentation

## 2019-02-22 LAB — CBC WITH DIFFERENTIAL/PLATELET
Abs Immature Granulocytes: 0.04 10*3/uL (ref 0.00–0.07)
Basophils Absolute: 0.1 10*3/uL (ref 0.0–0.1)
Basophils Relative: 1 %
Eosinophils Absolute: 0.3 10*3/uL (ref 0.0–0.5)
Eosinophils Relative: 4 %
HCT: 36.2 % (ref 36.0–46.0)
Hemoglobin: 11.6 g/dL — ABNORMAL LOW (ref 12.0–15.0)
Immature Granulocytes: 1 %
Lymphocytes Relative: 26 %
Lymphs Abs: 2.1 10*3/uL (ref 0.7–4.0)
MCH: 28.9 pg (ref 26.0–34.0)
MCHC: 32 g/dL (ref 30.0–36.0)
MCV: 90 fL (ref 80.0–100.0)
Monocytes Absolute: 0.8 10*3/uL (ref 0.1–1.0)
Monocytes Relative: 10 %
Neutro Abs: 4.8 10*3/uL (ref 1.7–7.7)
Neutrophils Relative %: 58 %
Platelets: 269 10*3/uL (ref 150–400)
RBC: 4.02 MIL/uL (ref 3.87–5.11)
RDW: 14.1 % (ref 11.5–15.5)
WBC: 8.1 10*3/uL (ref 4.0–10.5)
nRBC: 0 % (ref 0.0–0.2)

## 2019-02-22 LAB — BASIC METABOLIC PANEL
Anion gap: 10 (ref 5–15)
BUN: 17 mg/dL (ref 8–23)
CO2: 24 mmol/L (ref 22–32)
Calcium: 8.7 mg/dL — ABNORMAL LOW (ref 8.9–10.3)
Chloride: 104 mmol/L (ref 98–111)
Creatinine, Ser: 0.95 mg/dL (ref 0.44–1.00)
GFR calc Af Amer: >60 mL/min (ref >60)
GFR calc non Af Amer: 58 mL/min — ABNORMAL LOW (ref >60)
Glucose, Bld: 265 mg/dL — ABNORMAL HIGH (ref 70–99)
Potassium: 3.5 mmol/L (ref 3.5–5.1)
Sodium: 138 mmol/L (ref 135–145)

## 2019-02-22 LAB — BRAIN NATRIURETIC PEPTIDE: B Natriuretic Peptide: 242.1 pg/mL — ABNORMAL HIGH (ref 0.0–100.0)

## 2019-02-22 LAB — TROPONIN I (HIGH SENSITIVITY)
Troponin I (High Sensitivity): 5 ng/L (ref <18)
Troponin I (High Sensitivity): 5 ng/L (ref <18)

## 2019-02-22 MED ORDER — KETOROLAC TROMETHAMINE 30 MG/ML IJ SOLN
15.0000 mg | Freq: Once | INTRAMUSCULAR | Status: AC
  Administered 2019-02-22: 01:00:00 15 mg via INTRAVENOUS
  Filled 2019-02-22: qty 1

## 2019-02-22 MED ORDER — AEROCHAMBER PLUS FLO-VU MEDIUM MISC
1.0000 | Freq: Once | Status: AC
  Administered 2019-02-22: 1
  Filled 2019-02-22: qty 1

## 2019-02-22 MED ORDER — IOHEXOL 350 MG/ML SOLN
100.0000 mL | Freq: Once | INTRAVENOUS | Status: AC | PRN
  Administered 2019-02-22: 100 mL via INTRAVENOUS

## 2019-02-22 MED ORDER — ALBUTEROL SULFATE HFA 108 (90 BASE) MCG/ACT IN AERS
4.0000 | INHALATION_SPRAY | Freq: Once | RESPIRATORY_TRACT | Status: AC
  Administered 2019-02-22: 4 via RESPIRATORY_TRACT
  Filled 2019-02-22: qty 6.7

## 2019-02-22 NOTE — ED Triage Notes (Signed)
Pt states daughter made her come in due to exp wheezing. Pt has upper right abd pain, has been dx'd adenocarcinoma stage II in August 2020. Pt has no complaints. NAD.

## 2019-02-22 NOTE — ED Notes (Signed)
Wheezing noted while pt speaking, also now c/o left arm pain
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
## 2019-02-25 ENCOUNTER — Other Ambulatory Visit: Payer: Self-pay | Admitting: Internal Medicine

## 2019-02-28 ENCOUNTER — Encounter (HOSPITAL_COMMUNITY): Payer: Self-pay | Admitting: Emergency Medicine

## 2019-02-28 ENCOUNTER — Ambulatory Visit (INDEPENDENT_AMBULATORY_CARE_PROVIDER_SITE_OTHER): Payer: Medicare HMO

## 2019-02-28 ENCOUNTER — Other Ambulatory Visit: Payer: Self-pay

## 2019-02-28 ENCOUNTER — Observation Stay (HOSPITAL_COMMUNITY)
Admission: EM | Admit: 2019-02-28 | Discharge: 2019-03-01 | Disposition: A | Discharge status: Home / Self Care | Payer: Medicare HMO | Attending: Family Medicine | Admitting: Family Medicine

## 2019-02-28 ENCOUNTER — Telehealth: Payer: Self-pay

## 2019-02-28 ENCOUNTER — Emergency Department (HOSPITAL_COMMUNITY): Payer: Medicare HMO

## 2019-02-28 DIAGNOSIS — Z20828 Contact with and (suspected) exposure to other viral communicable diseases: Secondary | ICD-10-CM | POA: Diagnosis not present

## 2019-02-28 DIAGNOSIS — Z95810 Presence of automatic (implantable) cardiac defibrillator: Secondary | ICD-10-CM | POA: Diagnosis not present

## 2019-02-28 DIAGNOSIS — I5032 Chronic diastolic (congestive) heart failure: Secondary | ICD-10-CM | POA: Insufficient documentation

## 2019-02-28 DIAGNOSIS — R05 Cough: Secondary | ICD-10-CM | POA: Diagnosis not present

## 2019-02-28 DIAGNOSIS — Z7982 Long term (current) use of aspirin: Secondary | ICD-10-CM | POA: Insufficient documentation

## 2019-02-28 DIAGNOSIS — Z923 Personal history of irradiation: Secondary | ICD-10-CM | POA: Diagnosis not present

## 2019-02-28 DIAGNOSIS — I429 Cardiomyopathy, unspecified: Secondary | ICD-10-CM | POA: Insufficient documentation

## 2019-02-28 DIAGNOSIS — Z791 Long term (current) use of non-steroidal anti-inflammatories (NSAID): Secondary | ICD-10-CM | POA: Diagnosis not present

## 2019-02-28 DIAGNOSIS — R062 Wheezing: Secondary | ICD-10-CM | POA: Diagnosis not present

## 2019-02-28 DIAGNOSIS — I110 Hypertensive heart disease with heart failure: Secondary | ICD-10-CM | POA: Diagnosis not present

## 2019-02-28 DIAGNOSIS — Z905 Acquired absence of kidney: Secondary | ICD-10-CM | POA: Insufficient documentation

## 2019-02-28 DIAGNOSIS — E039 Hypothyroidism, unspecified: Secondary | ICD-10-CM | POA: Insufficient documentation

## 2019-02-28 DIAGNOSIS — K219 Gastro-esophageal reflux disease without esophagitis: Secondary | ICD-10-CM | POA: Insufficient documentation

## 2019-02-28 DIAGNOSIS — F17210 Nicotine dependence, cigarettes, uncomplicated: Secondary | ICD-10-CM | POA: Insufficient documentation

## 2019-02-28 DIAGNOSIS — C3491 Malignant neoplasm of unspecified part of right bronchus or lung: Secondary | ICD-10-CM | POA: Diagnosis present

## 2019-02-28 DIAGNOSIS — M6281 Muscle weakness (generalized): Secondary | ICD-10-CM | POA: Diagnosis not present

## 2019-02-28 DIAGNOSIS — F419 Anxiety disorder, unspecified: Secondary | ICD-10-CM | POA: Insufficient documentation

## 2019-02-28 DIAGNOSIS — R0902 Hypoxemia: Secondary | ICD-10-CM

## 2019-02-28 DIAGNOSIS — E119 Type 2 diabetes mellitus without complications: Secondary | ICD-10-CM

## 2019-02-28 DIAGNOSIS — R0602 Shortness of breath: Secondary | ICD-10-CM | POA: Diagnosis not present

## 2019-02-28 DIAGNOSIS — E785 Hyperlipidemia, unspecified: Secondary | ICD-10-CM | POA: Diagnosis not present

## 2019-02-28 DIAGNOSIS — Z79899 Other long term (current) drug therapy: Secondary | ICD-10-CM | POA: Insufficient documentation

## 2019-02-28 DIAGNOSIS — I10 Essential (primary) hypertension: Secondary | ICD-10-CM | POA: Diagnosis not present

## 2019-02-28 DIAGNOSIS — Z794 Long term (current) use of insulin: Secondary | ICD-10-CM | POA: Insufficient documentation

## 2019-02-28 DIAGNOSIS — Z209 Contact with and (suspected) exposure to unspecified communicable disease: Secondary | ICD-10-CM | POA: Diagnosis not present

## 2019-02-28 DIAGNOSIS — C3481 Malignant neoplasm of overlapping sites of right bronchus and lung: Secondary | ICD-10-CM | POA: Insufficient documentation

## 2019-02-28 DIAGNOSIS — R069 Unspecified abnormalities of breathing: Secondary | ICD-10-CM | POA: Diagnosis not present

## 2019-02-28 DIAGNOSIS — J9601 Acute respiratory failure with hypoxia: Principal | ICD-10-CM | POA: Insufficient documentation

## 2019-02-28 DIAGNOSIS — J441 Chronic obstructive pulmonary disease with (acute) exacerbation: Secondary | ICD-10-CM | POA: Diagnosis present

## 2019-02-28 DIAGNOSIS — F329 Major depressive disorder, single episode, unspecified: Secondary | ICD-10-CM | POA: Diagnosis not present

## 2019-02-28 LAB — CBC
HCT: 36.8 % (ref 36.0–46.0)
Hemoglobin: 11.8 g/dL — ABNORMAL LOW (ref 12.0–15.0)
MCH: 28.7 pg (ref 26.0–34.0)
MCHC: 32.1 g/dL (ref 30.0–36.0)
MCV: 89.5 fL (ref 80.0–100.0)
Platelets: 271 10*3/uL (ref 150–400)
RBC: 4.11 MIL/uL (ref 3.87–5.11)
RDW: 14.2 % (ref 11.5–15.5)
WBC: 7.8 10*3/uL (ref 4.0–10.5)
nRBC: 0 % (ref 0.0–0.2)

## 2019-02-28 LAB — BASIC METABOLIC PANEL
Anion gap: 9 (ref 5–15)
BUN: 13 mg/dL (ref 8–23)
CO2: 27 mmol/L (ref 22–32)
Calcium: 9.7 mg/dL (ref 8.9–10.3)
Chloride: 103 mmol/L (ref 98–111)
Creatinine, Ser: 1.17 mg/dL — ABNORMAL HIGH (ref 0.44–1.00)
GFR calc Af Amer: 52 mL/min — ABNORMAL LOW (ref >60)
GFR calc non Af Amer: 45 mL/min — ABNORMAL LOW (ref >60)
Glucose, Bld: 85 mg/dL (ref 70–99)
Potassium: 4 mmol/L (ref 3.5–5.1)
Sodium: 139 mmol/L (ref 135–145)

## 2019-02-28 LAB — BRAIN NATRIURETIC PEPTIDE: B Natriuretic Peptide: 211.1 pg/mL — ABNORMAL HIGH (ref 0.0–100.0)

## 2019-02-28 LAB — TROPONIN I (HIGH SENSITIVITY)
Troponin I (High Sensitivity): 7 ng/L (ref <18)
Troponin I (High Sensitivity): 7 ng/L (ref <18)

## 2019-02-28 LAB — POC SARS CORONAVIRUS 2 AG -  ED: SARS Coronavirus 2 Ag: NEGATIVE

## 2019-02-28 MED ORDER — ALBUTEROL SULFATE HFA 108 (90 BASE) MCG/ACT IN AERS
2.0000 | INHALATION_SPRAY | Freq: Once | RESPIRATORY_TRACT | Status: AC
  Administered 2019-02-28: 2 via RESPIRATORY_TRACT
  Filled 2019-02-28: qty 6.7

## 2019-02-28 MED ORDER — METHYLPREDNISOLONE SODIUM SUCC 125 MG IJ SOLR
125.0000 mg | Freq: Once | INTRAMUSCULAR | Status: AC
  Administered 2019-02-28: 125 mg via INTRAVENOUS
  Filled 2019-02-28: qty 2

## 2019-02-28 NOTE — ED Triage Notes (Signed)
Patient c/o shortness of breath and chest congestion x 1 week. Patient called PCP and was told to come to ER for further evaluation. Patient was given inhaler last visit but patient states "she didn't show me how to use it." states she was recently tested for covid but unsure of results.
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
## 2019-02-28 NOTE — ED Notes (Signed)
Pt ambulated well to bathroom. O2 sats stayed above 92%

## 2019-02-28 NOTE — Telephone Encounter (Signed)
Left message for patient to remind of missed remote transmission.  

## 2019-02-28 NOTE — ED Notes (Signed)
Rayne Martin(Daughter#[phone]).

## 2019-02-28 NOTE — ED Notes (Signed)
Pacemaker interrogated. 

## 2019-03-01 ENCOUNTER — Telehealth: Payer: Self-pay | Admitting: *Deleted

## 2019-03-01 ENCOUNTER — Encounter: Payer: Self-pay | Admitting: Internal Medicine

## 2019-03-01 DIAGNOSIS — J441 Chronic obstructive pulmonary disease with (acute) exacerbation: Secondary | ICD-10-CM | POA: Diagnosis not present

## 2019-03-01 DIAGNOSIS — J9601 Acute respiratory failure with hypoxia: Secondary | ICD-10-CM

## 2019-03-01 DIAGNOSIS — R0602 Shortness of breath: Secondary | ICD-10-CM

## 2019-03-01 DIAGNOSIS — C3491 Malignant neoplasm of unspecified part of right bronchus or lung: Secondary | ICD-10-CM | POA: Diagnosis not present

## 2019-03-01 DIAGNOSIS — E119 Type 2 diabetes mellitus without complications: Secondary | ICD-10-CM | POA: Diagnosis not present

## 2019-03-01 DIAGNOSIS — I10 Essential (primary) hypertension: Secondary | ICD-10-CM | POA: Diagnosis not present

## 2019-03-01 DIAGNOSIS — Z794 Long term (current) use of insulin: Secondary | ICD-10-CM | POA: Diagnosis not present

## 2019-03-01 LAB — CBC
HCT: 36.4 % (ref 36.0–46.0)
Hemoglobin: 12 g/dL (ref 12.0–15.0)
MCH: 29.4 pg (ref 26.0–34.0)
MCHC: 33 g/dL (ref 30.0–36.0)
MCV: 89.2 fL (ref 80.0–100.0)
Platelets: 199 10*3/uL (ref 150–400)
RBC: 4.08 MIL/uL (ref 3.87–5.11)
RDW: 14 % (ref 11.5–15.5)
WBC: 7.5 10*3/uL (ref 4.0–10.5)
nRBC: 0 % (ref 0.0–0.2)

## 2019-03-01 LAB — BASIC METABOLIC PANEL
Anion gap: 12 (ref 5–15)
BUN: 13 mg/dL (ref 8–23)
CO2: 28 mmol/L (ref 22–32)
Calcium: 9.7 mg/dL (ref 8.9–10.3)
Chloride: 100 mmol/L (ref 98–111)
Creatinine, Ser: 0.84 mg/dL (ref 0.44–1.00)
GFR calc Af Amer: >60 mL/min (ref >60)
GFR calc non Af Amer: >60 mL/min (ref >60)
Glucose, Bld: 131 mg/dL — ABNORMAL HIGH (ref 70–99)
Potassium: 3.6 mmol/L (ref 3.5–5.1)
Sodium: 140 mmol/L (ref 135–145)

## 2019-03-01 LAB — GLUCOSE, CAPILLARY: Glucose-Capillary: 346 mg/dL — ABNORMAL HIGH (ref 70–99)

## 2019-03-01 LAB — RESPIRATORY PANEL BY PCR

## 2019-03-01 LAB — HIV ANTIBODY (ROUTINE TESTING W REFLEX): HIV Screen 4th Generation wRfx: NONREACTIVE

## 2019-03-01 LAB — CBG MONITORING, ED: Glucose-Capillary: 111 mg/dL — ABNORMAL HIGH (ref 70–99)

## 2019-03-01 LAB — SARS CORONAVIRUS 2 (TAT 6-24 HRS): SARS Coronavirus 2: NEGATIVE

## 2019-03-01 MED ORDER — MOMETASONE FUROATE 0.1 % EX CREA
TOPICAL_CREAM | Freq: Two times a day (BID) | CUTANEOUS | Status: DC
  Filled 2019-03-01: qty 15

## 2019-03-01 MED ORDER — ENOXAPARIN SODIUM 40 MG/0.4ML ~~LOC~~ SOLN
40.0000 mg | SUBCUTANEOUS | Status: DC
  Administered 2019-03-01: 40 mg via SUBCUTANEOUS
  Filled 2019-03-01: qty 0.4

## 2019-03-01 MED ORDER — PANTOPRAZOLE SODIUM 40 MG PO TBEC
40.0000 mg | DELAYED_RELEASE_TABLET | Freq: Two times a day (BID) | ORAL | Status: DC
  Administered 2019-03-01: 40 mg via ORAL
  Filled 2019-03-01: qty 1

## 2019-03-01 MED ORDER — ALPRAZOLAM 0.5 MG PO TABS: 1.0000 mg | ORAL_TABLET | Freq: Four times a day (QID) | ORAL | Status: DC | PRN

## 2019-03-01 MED ORDER — PREDNISONE 50 MG PO TABS: 50.0000 mg | ORAL_TABLET | Freq: Every day | ORAL | 0 refills | Status: AC

## 2019-03-01 MED ORDER — INSULIN ASPART 100 UNIT/ML ~~LOC~~ SOLN
0.0000 [IU] | Freq: Three times a day (TID) | SUBCUTANEOUS | Status: DC
  Administered 2019-03-01: 15 [IU] via SUBCUTANEOUS
  Administered 2019-03-01: 11 [IU] via SUBCUTANEOUS

## 2019-03-01 MED ORDER — LISINOPRIL 10 MG PO TABS
10.0000 mg | ORAL_TABLET | Freq: Two times a day (BID) | ORAL | Status: DC
  Administered 2019-03-01: 10 mg via ORAL
  Filled 2019-03-01: qty 1

## 2019-03-01 MED ORDER — TRAMADOL HCL 50 MG PO TABS: 50.0000 mg | ORAL_TABLET | Freq: Three times a day (TID) | ORAL | Status: DC | PRN

## 2019-03-01 MED ORDER — ATORVASTATIN CALCIUM 40 MG PO TABS
40.0000 mg | ORAL_TABLET | Freq: Every day | ORAL | Status: DC
  Administered 2019-03-01: 40 mg via ORAL
  Filled 2019-03-01: qty 1

## 2019-03-01 MED ORDER — DOXYCYCLINE HYCLATE 100 MG PO TABS
100.0000 mg | ORAL_TABLET | Freq: Two times a day (BID) | ORAL | Status: DC
  Administered 2019-03-01 (×2): 100 mg via ORAL
  Filled 2019-03-01 (×2): qty 1

## 2019-03-01 MED ORDER — GABAPENTIN 100 MG PO CAPS
100.0000 mg | ORAL_CAPSULE | Freq: Three times a day (TID) | ORAL | Status: DC
  Administered 2019-03-01 (×2): 100 mg via ORAL
  Filled 2019-03-01 (×2): qty 1

## 2019-03-01 MED ORDER — PRO-STAT SUGAR FREE PO LIQD: 30.0000 mL | Freq: Two times a day (BID) | ORAL | Status: DC

## 2019-03-01 MED ORDER — BUPROPION HCL ER (XL) 300 MG PO TB24
300.0000 mg | ORAL_TABLET | Freq: Every day | ORAL | Status: DC
  Administered 2019-03-01: 300 mg via ORAL
  Filled 2019-03-01: qty 1

## 2019-03-01 MED ORDER — ENSURE MAX PROTEIN PO LIQD: 11.0000 [oz_av] | Freq: Every day | ORAL | Status: DC

## 2019-03-01 MED ORDER — ENOXAPARIN SODIUM 80 MG/0.8ML ~~LOC~~ SOLN: 80.0000 mg | SUBCUTANEOUS | Status: DC

## 2019-03-01 MED ORDER — MOMETASONE FURO-FORMOTEROL FUM 200-5 MCG/ACT IN AERO
1.0000 | INHALATION_SPRAY | Freq: Two times a day (BID) | RESPIRATORY_TRACT | Status: DC
  Administered 2019-03-01: 1 via RESPIRATORY_TRACT
  Filled 2019-03-01 (×2): qty 8.8

## 2019-03-01 MED ORDER — PREDNISONE 20 MG PO TABS
40.0000 mg | ORAL_TABLET | Freq: Every day | ORAL | Status: DC
  Administered 2019-03-01: 40 mg via ORAL
  Filled 2019-03-01: qty 2

## 2019-03-01 MED ORDER — UMECLIDINIUM BROMIDE 62.5 MCG/INH IN AEPB
1.0000 | INHALATION_SPRAY | Freq: Every day | RESPIRATORY_TRACT | Status: DC
  Administered 2019-03-01: 1 via RESPIRATORY_TRACT
  Filled 2019-03-01 (×2): qty 7

## 2019-03-01 MED ORDER — METOPROLOL TARTRATE 25 MG PO TABS
25.0000 mg | ORAL_TABLET | Freq: Two times a day (BID) | ORAL | Status: DC
  Administered 2019-03-01: 25 mg via ORAL
  Filled 2019-03-01: qty 1

## 2019-03-01 MED ORDER — DICLOFENAC SODIUM 1 % EX GEL
2.0000 g | Freq: Four times a day (QID) | CUTANEOUS | Status: DC
  Administered 2019-03-01: 2 g via TOPICAL
  Filled 2019-03-01: qty 100

## 2019-03-01 MED ORDER — ASPIRIN EC 81 MG PO TBEC
81.0000 mg | DELAYED_RELEASE_TABLET | Freq: Every day | ORAL | Status: DC
  Administered 2019-03-01: 81 mg via ORAL
  Filled 2019-03-01: qty 1

## 2019-03-01 MED ORDER — INSULIN GLARGINE 100 UNIT/ML ~~LOC~~ SOLN
37.0000 [IU] | Freq: Every day | SUBCUTANEOUS | Status: DC
  Administered 2019-03-01: 37 [IU] via SUBCUTANEOUS
  Filled 2019-03-01 (×2): qty 0.37

## 2019-03-01 MED ORDER — FUROSEMIDE 20 MG PO TABS
20.0000 mg | ORAL_TABLET | Freq: Every day | ORAL | Status: DC
  Administered 2019-03-01: 20 mg via ORAL
  Filled 2019-03-01: qty 1

## 2019-03-01 MED ORDER — TIZANIDINE HCL 2 MG PO TABS
2.0000 mg | ORAL_TABLET | Freq: Four times a day (QID) | ORAL | Status: DC | PRN
  Filled 2019-03-01: qty 1

## 2019-03-01 MED ORDER — LEVOTHYROXINE SODIUM 50 MCG PO TABS
50.0000 ug | ORAL_TABLET | Freq: Every day | ORAL | Status: DC
  Administered 2019-03-01: 50 ug via ORAL
  Filled 2019-03-01: qty 1

## 2019-03-01 MED ORDER — ALBUTEROL SULFATE (2.5 MG/3ML) 0.083% IN NEBU: 2.5000 mg | INHALATION_SOLUTION | RESPIRATORY_TRACT | Status: DC | PRN

## 2019-03-01 NOTE — Evaluation (Addendum)
Occupational Therapy Evaluation and Discharge Patient Details Name: [name] MRN: [id number] DOB: 1940/05/16 Today's Date: 03/01/2019    History of Present Illness Ptis a 78 y/o female with PMH of NSCLC or R lung s/p radiation therapy in Oct, DM2, HTN, obesity, NICM s/p AICD placement, presenting to ED with SOB associated with dry, non-productive cough. COVID negative. CT reveals unchanged RML lung mass, enlarging pulmonary nodule worrisome for metastatic dz, B emphysematous changes to lungs.    Clinical Impression   PTA patient independent with ADLs, mobility, driving. Admitted for above and presenting near baseline modified independent level with ADLs and mobility.  Cognition WFL.  On 2L supplemental O2 upon entry and removed during session with SpO2 maintained >93% during daily tasks.  Pt with no questions or concerns. Believe she can dc home when MD deems medically appropriate.  OT will sign off.     Follow Up Recommendations  No OT follow up    Equipment Recommendations  None recommended by OT    Recommendations for Other Services       Precautions / Restrictions Restrictions Weight Bearing Restrictions: No      Mobility Bed Mobility               General bed mobility comments: EOB upon entry   Transfers Overall transfer level: Modified independent Equipment used: None             General transfer comment: no assist required    Balance Overall balance assessment: Mild deficits observed, not formally tested                                         ADL either performed or assessed with clinical judgement   ADL Overall ADL's : Modified independent;At baseline                                       General ADL Comments: patient demonstrating ability to complete transfers, LB ADLs with modifed independence      Vision Patient Visual Report: No change from baseline Vision Assessment?: No apparent visual deficits      Perception     Praxis      Pertinent Vitals/Pain Pain Assessment: 0-10 Pain Score: 7  Pain Location: back Pain Descriptors / Indicators: Discomfort Pain Intervention(s): Monitored during session     Hand Dominance Right   Extremity/Trunk Assessment Upper Extremity Assessment Upper Extremity Assessment: Overall WFL for tasks assessed   Lower Extremity Assessment Lower Extremity Assessment: Defer to PT evaluation       Communication Communication Communication: No difficulties   Cognition Arousal/Alertness: Awake/alert Behavior During Therapy: WFL for tasks assessed/performed Overall Cognitive Status: Within Functional Limits for tasks assessed                                 General Comments: verbose and tangential; short blessed test reveals 2/28=normal; pt with difficulty completing dual cognitive tasks but anticipate baseline    General Comments  initally used RW for mobility in room, transitioned to no AD with modifed independence; pt on 2L supplemental O2 upon entry 96%, removed and completed session on RA with O2 maintained >93% and RN aware     Exercises     Shoulder Instructions  Home Living Family/patient expects to be discharged to:: Private residence Living Arrangements: Alone Available Help at Discharge: Family;Available PRN/intermittently Type of Home: House Home Access: Stairs to enter [employer] of Steps: 6 Entrance Stairs-Rails: Can reach both Home Layout: One level     Bathroom Shower/Tub: Teacher, early years/pre: Standard     Home Equipment: Clinical cytogeneticist - 4 wheels;Walker - 2 wheels   Additional Comments: lives alone but family close by to assist as needed       Prior Functioning/Environment Level of Independence: Independent        Comments: uses RW intermittently, independent ADLs, IADLs, dr5iving         OT Problem List: Decreased activity tolerance      OT  Treatment/Interventions:      OT Goals(Current goals can be found in the care plan section) Acute Rehab OT Goals Patient Stated Goal: home soon OT Goal Formulation: With patient  OT Frequency:     Barriers to D/C:            Co-evaluation              AM-PAC OT "6 Clicks" Daily Activity     Outcome Measure Help from another person eating meals?: None Help from another person taking care of personal grooming?: None Help from another person toileting, which includes using toliet, bedpan, or urinal?: None Help from another person bathing (including washing, rinsing, drying)?: None Help from another person to put on and taking off regular upper body clothing?: None Help from another person to put on and taking off regular lower body clothing?: None 6 Click Score: 24   End of Session Equipment Utilized During Treatment: Rolling walker Nurse Communication: Mobility status  Activity Tolerance: Patient tolerated treatment well Patient left: in chair;with call bell/phone within reach  OT Visit Diagnosis: Muscle weakness (generalized) (M62.81)                Time: 5449-2010 OT Time Calculation (min): 21 min Charges:  OT General Charges $OT Visit: 1 Visit OT Evaluation $OT Eval Low Complexity: 1 Low  Jolaine Artist, OT Acute Rehabilitation Services Pager [phone] Office [phone]   Delight Stare 03/01/2019, 11:01 AM

## 2019-03-01 NOTE — ED Notes (Signed)
Daughter- Francis Gaines [phone] would like an update

## 2019-03-01 NOTE — Progress Notes (Signed)
Initial Nutrition Assessment  DOCUMENTATION CODES:   Obesity unspecified  INTERVENTION:   -Recommend re-weigh patient  -Ensure MAX Protein po daily, each supplement provides 150 kcal and 30 grams of protein -Prostat liquid protein PO 30 ml BID with meals, each supplement provides 100 kcal, 15 grams protein. -Multivitamin with minerals daily  NUTRITION DIAGNOSIS:   Increased nutrient needs related to cancer and cancer related treatments, chronic illness(COPD) as evidenced by estimated needs.  GOAL:   Patient will meet greater than or equal to 90% of their needs  MONITOR:   PO intake, Supplement acceptance, Weight trends, Labs, I & O's  REASON FOR ASSESSMENT:   Consult COPD Protocol  ASSESSMENT:   78 y.o. 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 Patient presents to ED with c/o SOB associated with dry, non-productive cough.CT chest also shows enlarging pulmonary nodule worrisome for metastatic dz.  Also shows B emphysematous changes to lungs. Admitted for COPD exacerbation.  **RD working remotely**  Patient admitted to unit this morning. No PO documented at this time. Pt came in with SOB. Oncology to see pt regarding possible mets. Will order Ensure Max and Prostat for additional protein.   Suspect weight that is recorded is an error (160 kg). As pt was recorded as weighing 167 lbs on 11/30. Will order pt to be re-weighed for this admission, to accurately assess weight status.   Medications: Lasix tablet Labs reviewed: CBGs: 111-346  NUTRITION - FOCUSED PHYSICAL EXAM:  Working remotely.  Diet Order:   Diet Order            Diet Carb Modified Fluid consistency: Thin; Room service appropriate? Yes  Diet effective now              EDUCATION NEEDS:   No education needs have been identified at this time  Skin:  Skin Assessment: Reviewed RN Assessment  Last BM:   PTA  Height:   Ht Readings from Last 1 Encounters:  02/28/19 5\' 1"  (1.549 m)    Weight:   Wt Readings from Last 1 Encounters:  02/28/19 (!) 160 kg    Ideal Body Weight:  47.7 kg  BMI:  Body mass index is 66.65 kg/m.  Estimated Nutritional Needs:   Kcal:  1450-1650  Protein:  70-80g  Fluid:  1.7L/day  Clayton Bibles, MS, RD, LDN Inpatient Clinical Dietitian Pager: [phone] After Hours Pager: [phone]
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
## 2019-03-01 NOTE — Telephone Encounter (Signed)
Copied from [place]. Topic: General - Other >> Feb 28, 2019  2:47 PM Leward Quan A wrote: Reason for CRM: Patient son in law called to inform Dr Jenny Reichmann that the patient was not doing very well had a very bad cough, congestion and issues with her breathing. Wife Raynet patients daughter went over to see patient immediately called the ambulance to have her transported to the hospital because of the struggle she was having with her breathing. Wanted to let Dr Jenny Reichmann be aware and if there are any questions please contact [place] at Ph# [phone]

## 2019-03-01 NOTE — Progress Notes (Signed)
PT Cancellation Note  Patient Details Name: Carolyn [name] MRN: [id number] DOB: Jul 24, 1940   Cancelled Treatment:    Reason Eval/Treat Not Completed: Other (comment).  Pt is walking with no assist and will be leaving with family soon.  Discharge PT order if not in need of tx tomorrow and still here.   Ramond Dial 03/01/2019, 2:57 PM   Mee Hives, PT MS Acute Rehab Dept. Number: [place] and [place]

## 2019-03-01 NOTE — Progress Notes (Signed)
EPIC Encounter for ICM Monitoring  Patient Name: Carolyn [name] is a 78 y.o. female Date: 03/01/2019 Primary Care Physican: Biagio Borg, MD Primary Cardiologist: Angelena Form Electrophysiologist: Allred Bi-V Pacing: >99%% 11/03/2020Weight:166lbs   Transmission reviewed.  Patient currently hospitalized with dx of COPD exacerbation.  She also has a lung mass.  CorVueThoracic impedancereturned to normal.  Prescribed:Furosemide 20 mgtake1tablet daily.   Labs: 02/06/2019 Creatinine 0.84, BUN 21, Potassium 4.2, Sodium 137, GFR 65.71 11/04/2018 Creatinine0.86, BUN21, Potassium3.8, Sodium137, GFR>60 10/27/2018 Creatinine0.81, BUN14, Potassium4.1, Sodium137, GFR>60  10/26/2018 Creatinine0.87, BUN12, Potassium4.0, Sodium137, GFR>60  10/25/2018 Creatinine0.84, BUN9, Potassium3.9, Sodium140, GFR>60  A complete set of results can be found in Results Review.  Recommendations:  None  Follow-up plan: ICM clinic phone appointment on 02/28/2019 to recheck fluid levels.   91 day device clinic remote transmission 04/17/2019.    Copy of ICM check sent to Dr. Rayann Heman.  3 month ICM trend: 03/01/2019    1 Year ICM trend:       Rosalene Billings, RN 03/01/2019 9:49 AM

## 2019-03-01 NOTE — Discharge Instructions (Signed)
Lung Cancer Lung cancer is an abnormal growth of cancerous cells that forms a mass (malignant tumor) in a lung. There are several types of lung cancer. The types are based on the appearance of the tumor cells. The two most common types are:  Non-small cell lung cancer. This type of lung cancer is the most common type. Non-small cell lung cancers include squamous cell carcinoma, adenocarcinoma, and large cell carcinoma.  Small cell lung cancer. In this type of lung cancer, abnormal cells are smaller than those of non-small cell lung cancer. Small cell lung cancer gets worse (progresses) faster than non-small cell lung cancer. What are the causes? The most common cause of lung cancer is smoking tobacco. The second most common cause is exposure to a chemical called radon. What increases the risk? You are more likely to develop this condition if:  You smoke tobacco.  You have been exposed to: ? Secondhand tobacco smoke. ? Radon gas. ? Uranium. ? Asbestos. ? Arsenic in drinking water. ? Air pollution.  You have a family or personal history of lung cancer.  You have had lung radiation therapy in the past.  You are older than age 68. What are the signs or symptoms? In the early stages, you may not have any symptoms. As the cancer progresses, symptoms may include:  A lasting cough, possibly with blood.  Fatigue.  Unexplained weight loss.  Shortness of breath.  Loud breathing (wheezing).  Chest pain.  Loss of appetite. Symptoms of advanced lung cancer include:  Hoarseness.  Bone or joint pain.  Weakness.  Change in the structure of the fingernails (clubbing), so that the nail looks like an upside-down spoon.  Swelling of the face or arms.  Inability to move the face (paralysis).  Drooping eyelids. How is this diagnosed? This condition may be diagnosed based on:  Your symptoms and medical history.  A physical exam.  A chest X-ray.  A CT scan.  Blood  tests.  Sputum tests.  Removal of a sample of lung tissue (lung biopsy) for testing. Your cancer will be assessed (staged) to determine how severe it is and how much it has spread (metastasized). How is this treated? Treatment depends on the type and stage of your cancer. Treatment may include one or more of the following:  Surgery to remove as much of the cancer as possible. Lymph nodes in the area may be removed and tested for cancer as well.  Medicines that kill cancer cells (chemotherapy).  High-energy rays that kill cancer cells (radiation therapy).  Chemotherapy. This treatment uses medicines to destroy cancer cells.  Targeted therapy. This targets specific parts of cancer cells and the area around them to block the growth and spread of the cancer. Targeted therapy can help limit the damage to healthy cells. Follow these instructions at home: Eating and drinking  Some of your treatments might affect your appetite. If you are having problems eating, or if you do not have an appetite, meet with a dietitian.  If you have side effects that affect your appetite, it may help to: ? Eat smaller meals and snacks often. ? Drink high-nutrition and high-calorie shakes or supplements. ? Eat bland and soft foods that are easy to eat. ? Avoid eating foods that are hot, spicy, or hard to swallow. General instructions   Do not use any products that contain nicotine or tobacco, such as cigarettes and e-cigarettes. If you need help quitting, ask your health care provider.  Do not drink alcohol.  If you are admitted to the hospital, make sure your cancer specialist (oncologist) is aware. Your cancer may affect your treatment for other conditions.  Take over-the-counter and prescription medicines only as told by your health care provider.  Consider joining a support group for people who have been diagnosed with lung cancer.  Work with your health care provider to manage any side effects of  treatment.  Keep all follow-up visits as told by your health care provider. This is important. Where to find more information  American Cancer Society: https://www.cancer.[place] ([place]): https://www.cancer.gov Contact a health care provider if you:  Lose weight without trying.  Have a persistent cough and wheezing.  Feel short of breath.  Get tired easily.  Have bone or joint pain.  Have difficulty swallowing.  Notice that your voice is changing or getting hoarse.  Have pain that does not get better with medicine. Get help right away if you:  Cough up blood.  Have new breathing problems.  Have chest pain.  Have a fever.  Have swelling in an ankle, leg, or arm, or the face or neck.  Have paralysis in your face.  Are very confused.  Have a drooping eyelid. Summary  Lung cancer is an abnormal growth of cancerous cells that forms a mass (malignant tumor) in a lung.  There are several types of lung cancer. The types are based on the appearance of the tumor cells. The two most common types are non-small cell and small cell.  The most common cause of lung cancer is smoking tobacco.  Early symptoms include a lasting cough, possibly with blood, and fatigue, unexplained weight loss, and shortness of breath.  After diagnosis, treatment depends on the type and stage of your cancer. This information is not intended to replace advice given to you by your health care provider. Make sure you discuss any questions you have with your health care provider. Document Released: 06/01/2000 Document Revised: 02/05/2017 Document Reviewed: 12/31/2016 Elsevier Patient Education  2020 [employer].

## 2019-03-01 NOTE — Discharge Summary (Signed)
Physician Discharge Summary  HARVEST DEIST WCB:[id number] DOB: [birth year] DOA: 02/28/2019  PCP: Biagio Borg, MD  Admit date: 02/28/2019 Discharge date: 03/01/2019  Admitted From: Home Disposition: Home  Recommendations for Outpatient Follow-up:  1. Follow up with PCP in 1-2 weeks 2. Follow-up with oncology as scheduled 3. Please obtain BMP/CBC in one week 4. Please follow up on the following pending results:  Home Health: None Equipment/Devices: None  Discharge Condition: Stable CODE STATUS: Full code Diet recommendation: Cardiac  Subjective: Seen and examined. Feels much better with no shortness of breath.  HPI: Carolyn [name] is a 78 y.o. 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  Patient presents to ED with c/o SOB associated with dry, non-productive cough.  No fevers, no headaches, no N/V/D.  Does have wheezing.  No sick contacts.   ED Course: Rapid COVID is negative, PCR is pending.  WBC nl, afebrile.  CT chest shows essentially unchanged RML lung mass that is completely occluding the RML pulmonary artery and RML bronchus with complete collapse of RML.  This is unchanged since Aug.  CT chest (performed on 03/04/2019 )also shows enlarging pulmonary nodule worrisome for metastatic dz.  Also shows B emphysematous changes to lungs.  Pt given albuterol and steroids with improvement in breathing, does desat to upper 80s with ambulation though.  Brief/Interim Summary: During this brief hospitalization, patient was admitted for acute hypoxic respiratory failure which was presumed to be secondary to acute COPD exacerbation for which she was started on Solu-Medrol. Overnight, patient improved significantly. When seen this morning, she had no dyspnea. She was saturating 95% on room air at rest and activity. She was seen by PT OT and they did not recommend further services to be  continued. Due to continued and perhaps enlarging right lung mass, I reached out to patient's radiation oncologist and discussed with his PE, Worthy Flank (whose note is also available in chart) and according to her, patient does not qualify for any further radiations at this point in time and patient has a scheduled appointment to see her oncologist in 2 weeks to discuss systemic therapy.  At this point in time since she is off of oxygen and does not have any wheezes, she is stable to be discharged.  I have prescribed her 4 more days of oral prednisone.  Discharge Diagnoses:  Principal Problem:   COPD exacerbation ([place]) Active Problems:   Diabetes ([place])   Essential hypertension, benign   Adenocarcinoma of right lung, stage 2 (HCC)   Acute respiratory failure with hypoxia [facility])    Discharge Instructions  Discharge Instructions    Discharge patient   Complete by: As directed    Discharge disposition: 01-Home or Self Care   Discharge patient date: 03/01/2019     Allergies as of 03/01/2019      Reactions   Band-aid Liquid Bandage [dermatological Products, Misc.] Hives, Itching   Depacon [valproic Acid] Other (See Comments)   JAUNDICE   Dilaudid [hydromorphone Hcl] Other (See Comments)   Jaundice   Divalproex Sodium Nausea And Vomiting, Other (See Comments)   Jaundice   Zinc Acetate Hives, Itching   Cephalexin Nausea And Vomiting   Hydromorphone Hcl Rash   Levofloxacin Nausea And Vomiting   Metformin Nausea And Vomiting   Simvastatin Other (See Comments)   Muscle soreness   Sulfa Antibiotics Nausea And Vomiting   Sulfonamide Derivatives Nausea And Vomiting  Valproic Acid Other (See Comments)   JAUNDICE      Medication List    TAKE these medications   ALPRAZolam 1 MG tablet Commonly known as: XANAX TAKE 1 TABLET BY MOUTH FOUR TIMES A DAY AS NEEDED What changed: See the new instructions.   aspirin 81 MG tablet Take 81 mg by mouth daily.   atorvastatin 40 MG  tablet Commonly known as: LIPITOR TAKE 1 TABLET BY MOUTH EVERY DAY   blood glucose meter kit and supplies Kit Use to test blood sugar up to three times a day. DX E11.09   buPROPion 300 MG 24 hr tablet Commonly known as: WELLBUTRIN XL TAKE 1 TABLET BY MOUTH EVERY DAY   desoximetasone 0.25 % cream Commonly known as: TOPICORT APPLY TO AFFECTED AREA TWICE A DAY What changed: See the new instructions.   furosemide 20 MG tablet Commonly known as: LASIX TAKE 1 TABLET BY MOUTH EVERY DAY What changed: when to take this   gabapentin 100 MG capsule Commonly known as: NEURONTIN TAKE 1 CAPSULE BY MOUTH THREE TIMES A DAY What changed: See the new instructions.   glucose blood test strip Commonly known as: Cool Blood Glucose Test Strips Use to test blood sugar up to three times a day. DX E11.09   Insulin Pen Needle 32G X 4 MM Misc Commonly known as: BD Pen Needle Nano U/F USE TO ADMINISTER INSULIN ONCE A DAY. DX E11.9   Januvia 100 MG tablet Generic drug: sitaGLIPtin TAKE 1 TABLET BY MOUTH EVERY DAY What changed: how much to take   Lancets Misc Use lancets to test blood sugar up to three times a day. DX E11.09   Lantus SoloStar 100 UNIT/ML Solostar Pen Generic drug: Insulin Glargine INJECT 37 UNITS INTO THE SKIN DAILY AT 10 PM. What changed: See the new instructions.   levothyroxine 50 MCG tablet Commonly known as: SYNTHROID TAKE 1 TABLET BY MOUTH EVERY DAY   lisinopril 10 MG tablet Commonly known as: ZESTRIL Take 10 mg by mouth 2 (two) times daily.   metoprolol tartrate 25 MG tablet Commonly known as: LOPRESSOR TAKE 1 TABLET BY MOUTH TWICE A DAY   omeprazole 20 MG capsule Commonly known as: PRILOSEC TAKE 1 CAPSULE BY MOUTH TWICE A DAY   pioglitazone 30 MG tablet Commonly known as: Actos Take 1 tablet (30 mg total) by mouth daily.   predniSONE 50 MG tablet Commonly known as: DELTASONE Take 1 tablet (50 mg total) by mouth daily with breakfast for 4 days.    tiZANidine 2 MG tablet Commonly known as: ZANAFLEX TAKE 1 TABLET BY MOUTH EVERY 6 HOURS AS NEEDED FOR MUSCLE SPASMS What changed: See the new instructions.   traMADol 50 MG tablet Commonly known as: ULTRAM TAKE 1 TABLET BY MOUTH THREE TIMES A DAY AS NEEDED What changed:   when to take this  reasons to take this   Vitamin D (Ergocalciferol) 1.25 MG (50000 UT) Caps capsule Commonly known as: DRISDOL Take 1 capsule (50,000 Units total) by mouth every 7 (seven) days.      Follow-up Information    Biagio Borg, MD Follow up in 1 week(s).   Specialties: Internal Medicine, Radiology Contact information: [place] [id number] [phone]        Burnell Blanks, MD .   Specialty: Cardiology Contact information: [place] [place] [address] [phone]          Allergies  Allergen Reactions  .  Band-Aid Liquid Bandage [Dermatological Products, Misc.] Hives and Itching  . Depacon [Valproic Acid] Other (See Comments)    JAUNDICE  . Dilaudid [Hydromorphone Hcl] Other (See Comments)    Jaundice  . Divalproex Sodium Nausea And Vomiting and Other (See Comments)    Jaundice  . Zinc Acetate Hives and Itching  . Cephalexin Nausea And Vomiting  . Hydromorphone Hcl Rash  . Levofloxacin Nausea And Vomiting  . Metformin Nausea And Vomiting  . Simvastatin Other (See Comments)    Muscle soreness   . Sulfa Antibiotics Nausea And Vomiting  . Sulfonamide Derivatives Nausea And Vomiting  . Valproic Acid Other (See Comments)    JAUNDICE      Consultations: Radiation oncologist over phone   Procedures/Studies: CT Angio Chest PE W and/or Wo Contrast  Result Date: 02/22/2019 CLINICAL DATA:  Expiratory wheezing. Right abdominal pain. History of lung cancer. EXAM: CT ANGIOGRAPHY CHEST WITH CONTRAST TECHNIQUE: Multidetector CT imaging of the chest was performed using the standard protocol during bolus administration of  intravenous contrast. Multiplanar CT image reconstructions and MIPs were obtained to evaluate the vascular anatomy. CONTRAST:  1108m OMNIPAQUE IOHEXOL 350 MG/ML SOLN COMPARISON:  PET-CT dated 11/15/2018. CT PE study dated 10/21/2018. FINDINGS: Cardiovascular: Contrast injection is sufficient to demonstrate satisfactory opacification of the pulmonary arteries to the segmental level. There is no pulmonary embolus. 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 This is unlikely to be secondary to acute pulmonary embolism. This is similar to prior CT PE study dated 10/21/2018. The heart size is normal. Coronary artery calcifications are noted. There are thoracic aortic calcifications. Mediastinum/Nodes: --there are few mildly enlarged mediastinal lymph nodes which are similar to prior CT. --No axillary lymphadenopathy. --no definite supraclavicular lymphadenopathy, however the left supraclavicular region is obscured by streak artifact. --Normal thyroid gland. --The esophagus is unremarkable Lungs/Pleura: There is complete collapse of the right middle lobe secondary to the patient's known right hilar mass. Again noted is a right upper lobe pulmonary nodule measuring approximately 1 cm on today's exam (previously measuring approximately 8 mm). There is an 8 mm pulmonary nodule in the right lower lobe (axial series 6, image 59). This nodule previously measured approximately the same on 10/21/2018. Emphysematous changes are noted bilaterally. There is no pneumothorax. There is no significant pleural effusion. Upper Abdomen: Stable appearing left adrenal nodules are noted. Musculoskeletal: No chest wall abnormality. No acute or significant osseous findings. Review of the MIP images confirms the above findings. IMPRESSION: 1. 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 This is similar to prior CT PE study dated 10/21/2018. 2. There is no evidence for an acute pulmonary embolism. 3. There is complete collapse of the right middle lobe secondary to the patient's known right hilar mass. This is unchanged since prior study. 4. Slight interval growth of the patient's right upper lobe pulmonary nodule, now measuring approximately 1 cm (previously measuring approximately 8 mm on 10/21/2018). This raises concern for metastatic disease. 5. Stable 7 mm pulmonary nodule in the right lower lobe. Attention on follow-up exams is recommended. Aortic Atherosclerosis (ICD10-I70.0) and Emphysema (ICD10-J43.9). Electronically Signed   By: CConstance HolsterM.D.   On: 02/22/2019 01:51   DG Chest Portable 1 View  Result Date: 02/28/2019 CLINICAL DATA:  Productive cough EXAM: PORTABLE CHEST 1 VIEW COMPARISON:  February 22, 2019 FINDINGS: There is  mild cardiomegaly. Aortic knob calcifications. A left-sided pacemaker seen the lead tip in the right atrium and right ventricle. Again noted is hazy airspace opacity at the right hila and right lower lobe with consistent with patient's known lung mass and atelectasis. No acute osseous abnormality. IMPRESSION: Stable examination. Electronically Signed   By: Prudencio Pair M.D.   On: 02/28/2019 19:10   DG Chest Portable 1 View  Result Date: 02/22/2019 CLINICAL DATA:  Shortness of breath. Wheezing. History of right lung cancer. EXAM: PORTABLE CHEST 1 VIEW COMPARISON:  PET CT 11/15/2018. Chest radiograph 10/26/2018 FINDINGS: Left-sided pacemaker in place. Volume loss in the right hemithorax with peripheral opacity, unchanged in radiographic appearance from prior. Cardiomegaly is not significantly changed. There is increased bronchial thickening from prior. No new focal airspace disease. No large pleural effusion. No pneumothorax. Patient is rotated. No acute osseous abnormalities are seen. IMPRESSION: 1. Increased bronchial thickening from  prior exam, suggest acute bronchitis. 2. Stable cardiomegaly. 3. Stable volume loss in the right hemithorax and peripheral opacity at the lung bases consistent with known bronchogenic malignancy. Electronically Signed   By: Keith Rake M.D.   On: 02/22/2019 01:08      Discharge Exam: Vitals:   03/01/19 1201 03/01/19 1355  BP:    Pulse:    Resp:    Temp:    SpO2: 98% 97%   Vitals:   03/01/19 0400 03/01/19 0755 03/01/19 1201 03/01/19 1355  BP:  139/77    Pulse:  82    Resp:      Temp: 97.6 F (36.4 C) 97.6 F (36.4 C)    TempSrc:  Oral    SpO2: 96% 97% 98% 97%  Weight:      Height:        General: Pt is alert, awake, not in acute distress Cardiovascular: RRR, S1/S2 +, no rubs, no gallops Respiratory: CTA bilaterally, no wheezing, no rhonchi Abdominal: Soft, NT, ND, bowel sounds + Extremities: no edema, no cyanosis    The results of significant diagnostics from this hospitalization (including imaging, microbiology, ancillary and laboratory) are listed below for reference.     Microbiology: Recent Results (from the past 240 hour(s))  SARS CORONAVIRUS 2 (TAT 6-24 HRS) Nasopharyngeal Nasopharyngeal Swab     Status: None   Collection Time: 02/28/19 11:06 PM   Specimen: Nasopharyngeal Swab  Result Value Ref Range Status   SARS Coronavirus 2 NEGATIVE NEGATIVE Final    Comment: (NOTE) SARS-CoV-2 target nucleic acids are NOT DETECTED. The SARS-CoV-2 RNA is generally detectable in upper and lower respiratory specimens during the acute phase of infection. Negative results do not preclude SARS-CoV-2 infection, do not rule out co-infections with other pathogens, and should not be used as the sole basis for treatment or other patient management decisions. Negative results must be combined with clinical observations, patient history, and epidemiological information. The expected result is Negative. Fact Sheet for Patients: SugarRoll.be Fact  Sheet for Healthcare Providers: https://www.woods-mathews.com/ This test is not yet approved or cleared by the [place] FDA and  has been authorized for detection and/or diagnosis of SARS-CoV-2 by FDA under an Emergency Use Authorization (EUA). This EUA will remain  in effect (meaning this test can be used) for the duration of the COVID-19 declaration under Section 56 4(b)(1) of the Act, 21 U.S.C. section 360bbb-3(b)(1), unless the authorization is terminated or revoked sooner. Performed at [facility], [place] [address], [place], [place] [address]   Respiratory Panel by PCR  Status: None   Collection Time: 03/01/19  1:18 AM   Specimen: Nasopharyngeal Swab; Respiratory  Result Value Ref Range Status   Adenovirus NOT DETECTED NOT DETECTED Final   Coronavirus 229E NOT DETECTED NOT DETECTED Final    Comment: (NOTE) The Coronavirus on the Respiratory Panel, DOES NOT test for the novel  Coronavirus (2019 nCoV)    Coronavirus HKU1 NOT DETECTED NOT DETECTED Final   Coronavirus NL63 NOT DETECTED NOT DETECTED Final   Coronavirus OC43 NOT DETECTED NOT DETECTED Final   Metapneumovirus NOT DETECTED NOT DETECTED Final   Rhinovirus / Enterovirus NOT DETECTED NOT DETECTED Final   Influenza A NOT DETECTED NOT DETECTED Final   Influenza B NOT DETECTED NOT DETECTED Final   Parainfluenza Virus 1 NOT DETECTED NOT DETECTED Final   Parainfluenza Virus 2 NOT DETECTED NOT DETECTED Final   Parainfluenza Virus 3 NOT DETECTED NOT DETECTED Final   Parainfluenza Virus 4 NOT DETECTED NOT DETECTED Final   Respiratory Syncytial Virus NOT DETECTED NOT DETECTED Final   Bordetella pertussis NOT DETECTED NOT DETECTED Final   Chlamydophila pneumoniae NOT DETECTED NOT DETECTED Final   Mycoplasma pneumoniae NOT DETECTED NOT DETECTED Final    Comment: Performed at [facility], [address], [place], [place] [address]     Labs: BNP (last 3 results) Recent Labs    02/22/19 0032  02/28/19 1822  BNP 242.1* 791.5*   Basic Metabolic Panel: Recent Labs  Lab 02/28/19 1613 03/01/19 0315  NA 139 140  K 4.0 3.6  CL 103 100  CO2 27 28  GLUCOSE 85 131*  BUN 13 13  CREATININE 1.17* 0.84  CALCIUM 9.7 9.7   Liver Function Tests: No results for input(s): AST, ALT, ALKPHOS, BILITOT, PROT, ALBUMIN in the last 168 hours. No results for input(s): LIPASE, AMYLASE in the last 168 hours. No results for input(s): AMMONIA in the last 168 hours. CBC: Recent Labs  Lab 02/28/19 1613 03/01/19 0315  WBC 7.8 7.5  HGB 11.8* 12.0  HCT 36.8 36.4  MCV 89.5 89.2  PLT 271 199   Cardiac Enzymes: No results for input(s): CKTOTAL, CKMB, CKMBINDEX, TROPONINI in the last 168 hours. BNP: Invalid input(s): POCBNP CBG: Recent Labs  Lab 03/01/19 0222 03/01/19 1130  GLUCAP 111* 346*   D-Dimer No results for input(s): DDIMER in the last 72 hours. Hgb A1c No results for input(s): HGBA1C in the last 72 hours. Lipid Profile No results for input(s): CHOL, HDL, LDLCALC, TRIG, CHOLHDL, LDLDIRECT in the last 72 hours. Thyroid function studies No results for input(s): TSH, T4TOTAL, T3FREE, THYROIDAB in the last 72 hours.  Invalid input(s): FREET3 Anemia work up No results for input(s): VITAMINB12, FOLATE, FERRITIN, TIBC, IRON, RETICCTPCT in the last 72 hours. Urinalysis    Component Value Date/Time   COLORURINE YELLOW 10/22/2018 [place] 10/22/2018 0531   LABSPEC 1.017 10/22/2018 0531   PHURINE 7.0 10/22/2018 0531   GLUCOSEU 50 (A) 10/22/2018 0531   GLUCOSEU NEGATIVE 04/08/2018 1525   HGBUR NEGATIVE 10/22/2018 0531   BILIRUBINUR NEGATIVE 10/22/2018 0531   KETONESUR NEGATIVE 10/22/2018 0531   PROTEINUR NEGATIVE 10/22/2018 0531   UROBILINOGEN 0.2 04/08/2018 1525   NITRITE NEGATIVE 10/22/2018 0531   LEUKOCYTESUR TRACE (A) 10/22/2018 0531   Sepsis Labs Invalid input(s): PROCALCITONIN,  WBC,  LACTICIDVEN Microbiology Recent Results (from the past 240  hour(s))  SARS CORONAVIRUS 2 (TAT 6-24 HRS) Nasopharyngeal Nasopharyngeal Swab     Status: None   Collection Time: 02/28/19 11:06 PM  Specimen: Nasopharyngeal Swab  Result Value Ref Range Status   SARS Coronavirus 2 NEGATIVE NEGATIVE Final    Comment: (NOTE) SARS-CoV-2 target nucleic acids are NOT DETECTED. The SARS-CoV-2 RNA is generally detectable in upper and lower respiratory specimens during the acute phase of infection. Negative results do not preclude SARS-CoV-2 infection, do not rule out co-infections with other pathogens, and should not be used as the sole basis for treatment or other patient management decisions. Negative results must be combined with clinical observations, patient history, and epidemiological information. The expected result is Negative. Fact Sheet for Patients: SugarRoll.be Fact Sheet for Healthcare Providers: https://www.woods-mathews.com/ This test is not yet approved or cleared by the [place] FDA and  has been authorized for detection and/or diagnosis of SARS-CoV-2 by FDA under an Emergency Use Authorization (EUA). This EUA will remain  in effect (meaning this test can be used) for the duration of the COVID-19 declaration under Section 56 4(b)(1) of the Act, 21 U.S.C. section 360bbb-3(b)(1), unless the authorization is terminated or revoked sooner. Performed at [facility], [place] [address], [place], [place] [address]   Respiratory Panel by PCR     Status: None   Collection Time: 03/01/19  1:18 AM   Specimen: Nasopharyngeal Swab; Respiratory  Result Value Ref Range Status   Adenovirus NOT DETECTED NOT DETECTED Final   Coronavirus 229E NOT DETECTED NOT DETECTED Final    Comment: (NOTE) The Coronavirus on the Respiratory Panel, DOES NOT test for the novel  Coronavirus (2019 nCoV)    Coronavirus HKU1 NOT DETECTED NOT DETECTED Final   Coronavirus NL63 NOT DETECTED NOT DETECTED Final   Coronavirus  OC43 NOT DETECTED NOT DETECTED Final   Metapneumovirus NOT DETECTED NOT DETECTED Final   Rhinovirus / Enterovirus NOT DETECTED NOT DETECTED Final   Influenza A NOT DETECTED NOT DETECTED Final   Influenza B NOT DETECTED NOT DETECTED Final   Parainfluenza Virus 1 NOT DETECTED NOT DETECTED Final   Parainfluenza Virus 2 NOT DETECTED NOT DETECTED Final   Parainfluenza Virus 3 NOT DETECTED NOT DETECTED Final   Parainfluenza Virus 4 NOT DETECTED NOT DETECTED Final   Respiratory Syncytial Virus NOT DETECTED NOT DETECTED Final   Bordetella pertussis NOT DETECTED NOT DETECTED Final   Chlamydophila pneumoniae NOT DETECTED NOT DETECTED Final   Mycoplasma pneumoniae NOT DETECTED NOT DETECTED Final    Comment: Performed at [facility], [place]. [address] [place], [place] [address]     Time coordinating discharge: Over 30 minutes  SIGNED:   Darliss Cheney, MD  [facility] 03/01/2019, 2:14 PM  If 7PM-7AM, please contact night-coverage [email or web address] Password [email or web address]

## 2019-03-01 NOTE — Progress Notes (Signed)
I spoke with Dr. Lisbeth Renshaw and with Dr. Doristine Bosworth about this patient. She recently completed SBRT style therapy to the right hilar mass in October 2020. She is planning to follow up with Dr. Julien Nordmann in a few weeks to discuss options of further systemic therapy for her at least stage II NSCLC. She does have several other nodules that will be followed, but at this time we will follow these expectantly. She does not have radiographic findings for pneumonitis, and doesn't have a known history of COPD. Her shortness of breath and oxygen needs are still being worked up, but it sounds like she is clinically improving. If her breathing was progressively declining along with productive cough and low grade temperatures,work up to rule out pneumonitis may be needed. Hopefully she is past that interval and given the style of therapy would be at low risk. We will follow along expectantly in the outpatient setting, and would not offer any further radiotherapy at this time.     Carola Rhine, PAC

## 2019-03-02 ENCOUNTER — Other Ambulatory Visit: Payer: Self-pay | Admitting: Physician Assistant

## 2019-03-02 ENCOUNTER — Telehealth: Payer: Self-pay | Admitting: *Deleted

## 2019-03-02 NOTE — Telephone Encounter (Signed)
Called pt there was no answer LMOM RTC to make hosp f/u.Marland KitchenJohny Chess

## 2019-03-06 NOTE — Telephone Encounter (Signed)
Due to office being closed for Christmas was not able to f/u w/pt on Friday. Per chart she has not called back to schedule appt. So I call pt this am made appt for 03/13/19. Completed TCM call below.Carolyn [name]  Transition Care Management Follow-up Telephone Call   Date discharged? 03/01/19   How have you been since you were released from the hospital? Pt states she is doing alright   Do you understand why you were in the hospital? YES   Do you understand the discharge instructions? NO. I had to go over medications with pt. She ? If she she take the new prednisone 50 mg since she was previously taking a dose pack from Dr. Jenny Reichmann. Inform pt to take the new rx that was given from the hosp which are 4 pills, and Dr. Jenny Reichmann will see her next week top make sure here lungs are clear.    Where were you discharged to? Home   Items Reviewed:  Medications reviewed: YES, discuss new rx for prednisone. Pt understood now that she need to take  Allergies reviewed: YES  Dietary changes reviewed: YES, heart healthy. She states she doesn't eat much of nothing  Referrals reviewed: No referral recommeded   Functional Questionnaire:  Activities of Daily Living (ADLs):   She states she are independent in the following: ambulation, bathing and hygiene, feeding, continence, grooming, toileting and dressing States she doesn't require assistance    Any transportation issues/concerns?: NO   Any patient concerns? NO   Confirmed importance and date/time of follow-up visits scheduled YES, appt 03/13/19  Provider Appointment booked with Dr. Jenny Reichmann  Confirmed with patient if condition begins to worsen call PCP or go to the ER.  Patient was given the office number and encouraged to call back with question or concerns.  : YES

## 2019-03-08 ENCOUNTER — Ambulatory Visit: Payer: Self-pay | Admitting: *Deleted

## 2019-03-08 NOTE — Telephone Encounter (Addendum)
Pt called and stated that she would like a call back from a nurse to go over medications and how she should take them. Please advise   Attempted to call pt 3 times regarding her message. No answer. Will route message to LB at [facility] for review.

## 2019-03-13 ENCOUNTER — Encounter: Payer: Self-pay | Admitting: Internal Medicine

## 2019-03-13 ENCOUNTER — Ambulatory Visit (INDEPENDENT_AMBULATORY_CARE_PROVIDER_SITE_OTHER): Payer: Medicare Other | Admitting: Internal Medicine

## 2019-03-13 ENCOUNTER — Other Ambulatory Visit: Payer: Self-pay

## 2019-03-13 VITALS — BP 138/82 | HR 78 | Temp 98.3°F | Resp 14 | Ht 61.0 in | Wt 172.0 lb

## 2019-03-13 DIAGNOSIS — M25512 Pain in left shoulder: Secondary | ICD-10-CM | POA: Diagnosis not present

## 2019-03-13 DIAGNOSIS — J9601 Acute respiratory failure with hypoxia: Secondary | ICD-10-CM

## 2019-03-13 DIAGNOSIS — Z8709 Personal history of other diseases of the respiratory system: Secondary | ICD-10-CM

## 2019-03-13 DIAGNOSIS — I10 Essential (primary) hypertension: Secondary | ICD-10-CM | POA: Diagnosis not present

## 2019-03-13 DIAGNOSIS — C3491 Malignant neoplasm of unspecified part of right bronchus or lung: Secondary | ICD-10-CM

## 2019-03-13 DIAGNOSIS — J441 Chronic obstructive pulmonary disease with (acute) exacerbation: Secondary | ICD-10-CM

## 2019-03-13 LAB — CBC WITH DIFFERENTIAL/PLATELET
Basophils Absolute: 0.1 10*3/uL (ref 0.0–0.1)
Basophils Relative: 0.7 % (ref 0.0–3.0)
Eosinophils Absolute: 0.2 10*3/uL (ref 0.0–0.7)
Eosinophils Relative: 1.6 % (ref 0.0–5.0)
HCT: 37.4 % (ref 36.0–46.0)
Hemoglobin: 12 g/dL (ref 12.0–15.0)
Lymphocytes Relative: 21.5 % (ref 12.0–46.0)
Lymphs Abs: 2.6 10*3/uL (ref 0.7–4.0)
MCHC: 32.2 g/dL (ref 30.0–36.0)
MCV: 88.8 fl (ref 78.0–100.0)
Monocytes Absolute: 1.2 10*3/uL — ABNORMAL HIGH (ref 0.1–1.0)
Monocytes Relative: 9.6 % (ref 3.0–12.0)
Neutro Abs: 8 10*3/uL — ABNORMAL HIGH (ref 1.4–7.7)
Neutrophils Relative %: 66.6 % (ref 43.0–77.0)
Platelets: 263 10*3/uL (ref 150.0–400.0)
RBC: 4.22 Mil/uL (ref 3.87–5.11)
RDW: 15.2 % (ref 11.5–15.5)
WBC: 12 10*3/uL — ABNORMAL HIGH (ref 4.0–10.5)

## 2019-03-13 LAB — BASIC METABOLIC PANEL
BUN: 25 mg/dL — ABNORMAL HIGH (ref 6–23)
CO2: 30 mEq/L (ref 19–32)
Calcium: 9.7 mg/dL (ref 8.4–10.5)
Chloride: 102 mEq/L (ref 96–112)
Creatinine, Ser: 0.95 mg/dL (ref 0.40–1.20)
GFR: 56.99 mL/min — ABNORMAL LOW (ref >60.00)
Glucose, Bld: 107 mg/dL — ABNORMAL HIGH (ref 70–99)
Potassium: 4.8 mEq/L (ref 3.5–5.1)
Sodium: 138 mEq/L (ref 135–145)

## 2019-03-13 MED ORDER — TRAMADOL HCL 50 MG PO TABS: 50.0000 mg | ORAL_TABLET | Freq: Three times a day (TID) | ORAL | 1 refills | Status: DC | PRN

## 2019-03-13 MED ORDER — ALBUTEROL SULFATE HFA 108 (90 BASE) MCG/ACT IN AERS: 2.0000 | INHALATION_SPRAY | Freq: Four times a day (QID) | RESPIRATORY_TRACT | 2 refills | Status: DC | PRN

## 2019-03-13 NOTE — Patient Instructions (Addendum)
Please continue all other medications as before, and refills have been done if requested, including the albuterol inhaler, and the refill for the tramadol for pain  Please have the pharmacy call with any other refills you may need.  Please continue your efforts at being more active, low cholesterol diet, and weight control.  Please keep your appointments with your specialists as you may have planned - oncology  Please go to the LAB at the blood drawing area for the tests to be done  You will be contacted by phone if any changes need to be made immediately.  Otherwise, you will receive a letter about your results with an explanation, but please check with MyChart first.  Please remember to sign up for MyChart if you have not done so, as this will be important to you in the future with finding out test results, communicating by private email, and scheduling acute appointments online when needed.  Please make an Appt with Sports Medicine on the first floor for your left shoulder as it seems you may have a rotater cuff tear

## 2019-03-13 NOTE — Assessment & Plan Note (Signed)
Overall improved, ok for albuterol MDI prn refill,  to f/u any worsening symptoms or concerns

## 2019-03-13 NOTE — Assessment & Plan Note (Signed)
Resolved, cont tx as above
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
## 2019-03-13 NOTE — Assessment & Plan Note (Signed)
stable overall by history and exam, recent data reviewed with pt, and pt to continue medical treatment as before,  to f/u any worsening symptoms or concerns  

## 2019-03-13 NOTE — Assessment & Plan Note (Signed)
For oncology f/u as planned

## 2019-03-13 NOTE — Telephone Encounter (Signed)
Pt stated done taking all Rx prednisone but still continue Rx Vitamin D--for 7 days.

## 2019-03-14 ENCOUNTER — Other Ambulatory Visit: Payer: Self-pay | Admitting: Internal Medicine

## 2019-03-14 NOTE — Telephone Encounter (Signed)
No need further high dose vit d  Please plan to change to OTC Vitamin D3 at 2000 units per day, indefinitely.

## 2019-03-15 ENCOUNTER — Ambulatory Visit: Payer: Medicare Other | Admitting: Family Medicine

## 2019-03-15 ENCOUNTER — Other Ambulatory Visit: Payer: Self-pay | Admitting: Internal Medicine

## 2019-03-15 LAB — GLUCOSE, CAPILLARY: Glucose-Capillary: 380 mg/dL — ABNORMAL HIGH (ref 70–99)

## 2019-03-15 NOTE — Progress Notes (Deleted)
   Subjective:    I'm seeing this patient as a consultation for:  Dr. Jenny Reichmann. Note will be routed back to referring provider/PCP.  CC: L shoulder pain  I, Lacoya Wilbanks, LAT, ATC, am serving as scribe for Dr. Lynne Leader.  HPI: Pt is a 79 y/o female presenting w/ c/o L shoulder pain  Past medical history, Surgical history, Family history not pertinant except as noted below, Social history, Allergies, and medications have been entered into the medical record, reviewed, and no changes needed.   Review of Systems: No headache, visual changes, nausea, vomiting, diarrhea, constipation, dizziness, abdominal pain, skin rash, fevers, chills, night sweats, weight loss, swollen lymph nodes, body aches, joint swelling, muscle aches, chest pain, shortness of breath, mood changes, visual or auditory hallucinations.   Objective:   There were no vitals filed for this visit. General: Well Developed, well nourished, and in no acute distress.  Neuro/Psych: Alert and oriented x3, extra-ocular muscles intact, able to move all 4 extremities, sensation grossly intact. Skin: Warm and dry, no rashes noted.  Respiratory: Not using accessory muscles, speaking in full sentences, trachea midline.  Cardiovascular: Pulses palpable, no extremity edema. Abdomen: Does not appear distended. MSK: ***  Lab and Radiology Results Results for orders placed or performed in visit on 03/13/19 (from the past 72 hour(s))  CBC with Differential     Status: Abnormal   Collection Time: 03/13/19  2:29 PM  Result Value Ref Range   WBC 12.0 (H) 4.0 - 10.5 K/uL   RBC 4.22 3.87 - 5.11 Mil/uL   Hemoglobin 12.0 12.0 - 15.0 g/dL   HCT 37.4 36.0 - 46.0 %   MCV 88.8 78.0 - 100.0 fl   MCHC 32.2 30.0 - 36.0 g/dL   RDW 15.2 11.5 - 15.5 %   Platelets 263.0 150.0 - 400.0 K/uL   Neutrophils Relative % 66.6 43.0 - 77.0 %   Lymphocytes Relative 21.5 12.0 - 46.0 %   Monocytes Relative 9.6 3.0 - 12.0 %   Eosinophils Relative 1.6 0.0 - 5.0 %   Basophils Relative 0.7 0.0 - 3.0 %   Neutro Abs 8.0 (H) 1.4 - 7.7 K/uL   Lymphs Abs 2.6 0.7 - 4.0 K/uL   Monocytes Absolute 1.2 (H) 0.1 - 1.0 K/uL   Eosinophils Absolute 0.2 0.0 - 0.7 K/uL   Basophils Absolute 0.1 0.0 - 0.1 K/uL  Basic metabolic panel     Status: Abnormal   Collection Time: 03/13/19  2:29 PM  Result Value Ref Range   Sodium 138 135 - 145 mEq/L   Potassium 4.8 3.5 - 5.1 mEq/L   Chloride 102 96 - 112 mEq/L   CO2 30 19 - 32 mEq/L   Glucose, Bld 107 (H) 70 - 99 mg/dL   BUN 25 (H) 6 - 23 mg/dL   Creatinine, Ser 0.95 0.40 - 1.20 mg/dL   GFR 56.99 (L) >60.00 mL/min   Calcium 9.7 8.4 - 10.5 mg/dL   No results found.  Impression and Recommendations:    Assessment and Plan: 79 y.o. female with ***.  PDMP not reviewed this encounter. No orders of the defined types were placed in this encounter.  No orders of the defined types were placed in this encounter.   Discussed warning signs or symptoms. Please see discharge instructions. Patient expresses understanding.   ***

## 2019-03-15 NOTE — Telephone Encounter (Signed)
No need further high dose vit d  Pleaset change to OTC Vitamin D3 at 2000 units per day, indefinitely. '

## 2019-03-20 ENCOUNTER — Other Ambulatory Visit: Payer: Self-pay

## 2019-03-20 ENCOUNTER — Inpatient Hospital Stay: Payer: Medicare Other | Attending: Internal Medicine

## 2019-03-20 ENCOUNTER — Other Ambulatory Visit: Payer: Self-pay | Admitting: Physician Assistant

## 2019-03-20 DIAGNOSIS — C342 Malignant neoplasm of middle lobe, bronchus or lung: Secondary | ICD-10-CM | POA: Insufficient documentation

## 2019-03-20 DIAGNOSIS — C3491 Malignant neoplasm of unspecified part of right bronchus or lung: Secondary | ICD-10-CM

## 2019-03-20 DIAGNOSIS — E875 Hyperkalemia: Secondary | ICD-10-CM

## 2019-03-20 LAB — CBC WITH DIFFERENTIAL (CANCER CENTER ONLY)
Abs Immature Granulocytes: 0.07 10*3/uL (ref 0.00–0.07)
Basophils Absolute: 0.1 10*3/uL (ref 0.0–0.1)
Basophils Relative: 1 %
Eosinophils Absolute: 0.1 10*3/uL (ref 0.0–0.5)
Eosinophils Relative: 1 %
HCT: 35.4 % — ABNORMAL LOW (ref 36.0–46.0)
Hemoglobin: 11.5 g/dL — ABNORMAL LOW (ref 12.0–15.0)
Immature Granulocytes: 1 %
Lymphocytes Relative: 8 %
Lymphs Abs: 1.1 10*3/uL (ref 0.7–4.0)
MCH: 28.5 pg (ref 26.0–34.0)
MCHC: 32.5 g/dL (ref 30.0–36.0)
MCV: 87.8 fL (ref 80.0–100.0)
Monocytes Absolute: 1.3 10*3/uL — ABNORMAL HIGH (ref 0.1–1.0)
Monocytes Relative: 9 %
Neutro Abs: 12.4 10*3/uL — ABNORMAL HIGH (ref 1.7–7.7)
Neutrophils Relative %: 80 %
Platelet Count: 197 10*3/uL (ref 150–400)
RBC: 4.03 MIL/uL (ref 3.87–5.11)
RDW: 14.5 % (ref 11.5–15.5)
WBC Count: 15.1 10*3/uL — ABNORMAL HIGH (ref 4.0–10.5)
nRBC: 0 % (ref 0.0–0.2)

## 2019-03-20 LAB — CMP (CANCER CENTER ONLY)
ALT: 15 U/L (ref 0–44)
AST: 15 U/L (ref 15–41)
Albumin: 3.7 g/dL (ref 3.5–5.0)
Alkaline Phosphatase: 57 U/L (ref 38–126)
Anion gap: 9 (ref 5–15)
BUN: 30 mg/dL — ABNORMAL HIGH (ref 8–23)
CO2: 24 mmol/L (ref 22–32)
Calcium: 8.6 mg/dL — ABNORMAL LOW (ref 8.9–10.3)
Chloride: 102 mmol/L (ref 98–111)
Creatinine: 1.15 mg/dL — ABNORMAL HIGH (ref 0.44–1.00)
GFR, Est AFR Am: 53 mL/min — ABNORMAL LOW (ref >60)
GFR, Estimated: 46 mL/min — ABNORMAL LOW (ref >60)
Glucose, Bld: 165 mg/dL — ABNORMAL HIGH (ref 70–99)
Potassium: 5.4 mmol/L — ABNORMAL HIGH (ref 3.5–5.1)
Sodium: 135 mmol/L (ref 135–145)
Total Bilirubin: 0.4 mg/dL (ref 0.3–1.2)
Total Protein: 7.1 g/dL (ref 6.5–8.1)

## 2019-03-22 ENCOUNTER — Telehealth: Payer: Self-pay | Admitting: Internal Medicine

## 2019-03-22 ENCOUNTER — Inpatient Hospital Stay: Payer: Medicare Other

## 2019-03-22 ENCOUNTER — Inpatient Hospital Stay: Payer: Medicare Other | Admitting: Internal Medicine

## 2019-03-22 ENCOUNTER — Telehealth: Payer: Self-pay | Admitting: Medical Oncology

## 2019-03-22 ENCOUNTER — Telehealth: Payer: Self-pay

## 2019-03-22 NOTE — Telephone Encounter (Signed)
Left message for patient to call back  

## 2019-03-22 NOTE — Telephone Encounter (Signed)
Returned patient's phone call regarding cancelling 01/13 appointment, per 1/13 scheduled message appointment has been cancelled.

## 2019-03-22 NOTE — Telephone Encounter (Signed)
LVM to return call about missed appt today and to call back .

## 2019-03-22 NOTE — Telephone Encounter (Signed)
Copied from [place] [id number]. Topic: General - Other >> Mar 22, 2019  9:36 AM Yvette Rack wrote: Reason for CRM: Pt daughter Raynette Hassell Done stated she received a call asking her to contact the office to make Dr. Jenny Reichmann aware that the new medication that pt was prescribed is making her sick. Raynette stated she does not know the name of the medication but it was recently prescribed. Raynette asked that pt be contacted

## 2019-03-22 NOTE — Telephone Encounter (Signed)
The only recent med changes were to refill albuterol and tramadol, so I am not sure what to say , thanks

## 2019-03-31 ENCOUNTER — Other Ambulatory Visit: Payer: Self-pay | Admitting: Internal Medicine

## 2019-03-31 ENCOUNTER — Telehealth: Payer: Self-pay | Admitting: Internal Medicine

## 2019-03-31 DIAGNOSIS — I10 Essential (primary) hypertension: Secondary | ICD-10-CM

## 2019-03-31 NOTE — Telephone Encounter (Signed)
No need further high dose vit d  Please change to OTC Vitamin D3 at 2000 units per day, indefinitely.

## 2019-03-31 NOTE — Telephone Encounter (Signed)
Patients daughter called stating that her pharmacy only refilled her Tramadol for 29 tablets but states she usually gets 90. States the pharmacy is telling her she is too soon for refills.  Patient states that it is not.  Would like to discuss.

## 2019-03-31 NOTE — Telephone Encounter (Signed)
Done erx 

## 2019-03-31 NOTE — Telephone Encounter (Signed)
Per controlled database she last filled Tramadol  # 90 on 02/21/19. I called pharmacy- she picked up a 7 day supply because her insurance is needing some type of certification. The pharmacy faxed [place].

## 2019-04-03 ENCOUNTER — Ambulatory Visit (INDEPENDENT_AMBULATORY_CARE_PROVIDER_SITE_OTHER): Payer: Medicare Other

## 2019-04-03 DIAGNOSIS — Z95810 Presence of automatic (implantable) cardiac defibrillator: Secondary | ICD-10-CM

## 2019-04-03 DIAGNOSIS — I5032 Chronic diastolic (congestive) heart failure: Secondary | ICD-10-CM | POA: Diagnosis not present

## 2019-04-05 ENCOUNTER — Other Ambulatory Visit: Payer: Self-pay | Admitting: Internal Medicine

## 2019-04-07 NOTE — Progress Notes (Signed)
EPIC Encounter for ICM Monitoring  Patient Name: Carolyn [name] is a 79 y.o. female Date: 04/07/2019 Primary Care Physican: Biagio Borg, MD Primary Cardiologist: Angelena Form Electrophysiologist: Allred Bi-V Pacing: >99%% 11/03/2020Weight:166lbs   Transmission reviewed.  CorVueThoracic impedancenormal.  Prescribed:Furosemide 20 mgtake1tablet daily.   Labs: 03/20/2019 Creatinine 1.15, BUN 30, Potassium 5.4, Sodium 35, GFR 46-53 03/13/2019 Creatinine 0.95, BUN 25, Potassium 4.8, Sodium 138  03/01/2019 Creatinine 0.84, BUN 13, Potassium 3.6, Sodium 140, GFR >60  02/28/2019 Creatinine 1.17, BUN 13, Potassium 4.0, Sodium 139, GFR 45-52  02/22/2019 Creatinine 0.95, BUN 17, Potassium 3.5, Sodium 138, GFR 58->60  02/06/2019 Creatinine 0.84, BUN 21, Potassium 4.2, Sodium 137, GFR 65.71 11/04/2018 Creatinine0.86, BUN21, Potassium3.8, Sodium137, GFR>60 10/27/2018 Creatinine0.81, BUN14, Potassium4.1, Sodium137, GFR>60  10/26/2018 Creatinine0.87, BUN12, Potassium4.0, Sodium137, GFR>60  10/25/2018 Creatinine0.84, BUN9, Potassium3.9, Sodium140, GFR>60  A complete set of results can be found in Results Review.  Recommendations: None  Follow-up plan: ICM clinic phone appointment on3/03/2019. 91 day device clinic remote transmission 04/17/2019.   Copy of ICM check sent to Dr.Allred.  3 month ICM trend: 04/03/2019    1 Year ICM trend:       Rosalene Billings, RN 04/07/2019 3:43 PM

## 2019-04-10 ENCOUNTER — Encounter: Payer: Self-pay | Admitting: Internal Medicine

## 2019-04-10 DIAGNOSIS — E119 Type 2 diabetes mellitus without complications: Secondary | ICD-10-CM

## 2019-04-10 NOTE — Telephone Encounter (Signed)
Please advise on referrals also pt has had her Tetanus 2009.Marland KitchenJohny Chess

## 2019-04-13 ENCOUNTER — Encounter: Payer: Self-pay | Admitting: Internal Medicine

## 2019-04-13 ENCOUNTER — Ambulatory Visit (INDEPENDENT_AMBULATORY_CARE_PROVIDER_SITE_OTHER): Payer: Medicare Other | Admitting: Internal Medicine

## 2019-04-13 ENCOUNTER — Other Ambulatory Visit: Payer: Self-pay

## 2019-04-13 VITALS — BP 138/82 | HR 78 | Temp 98.2°F | Ht 61.0 in | Wt 171.0 lb

## 2019-04-13 DIAGNOSIS — M5412 Radiculopathy, cervical region: Secondary | ICD-10-CM | POA: Insufficient documentation

## 2019-04-13 DIAGNOSIS — C3481 Malignant neoplasm of overlapping sites of right bronchus and lung: Secondary | ICD-10-CM | POA: Diagnosis not present

## 2019-04-13 DIAGNOSIS — I5032 Chronic diastolic (congestive) heart failure: Secondary | ICD-10-CM

## 2019-04-13 DIAGNOSIS — J449 Chronic obstructive pulmonary disease, unspecified: Secondary | ICD-10-CM

## 2019-04-13 MED ORDER — HYDROCODONE-ACETAMINOPHEN 7.5-325 MG PO TABS: 1.0000 | ORAL_TABLET | Freq: Four times a day (QID) | ORAL | 0 refills | Status: DC | PRN

## 2019-04-13 MED ORDER — VITAMIN D (ERGOCALCIFEROL) 1.25 MG (50000 UNIT) PO CAPS: 50000.0000 [IU] | ORAL_CAPSULE | ORAL | 0 refills | Status: AC

## 2019-04-13 MED ORDER — GABAPENTIN 300 MG PO CAPS: 300.0000 mg | ORAL_CAPSULE | Freq: Three times a day (TID) | ORAL | 3 refills | Status: DC

## 2019-04-13 NOTE — Patient Instructions (Addendum)
Please take all new medication as prescribed - the hydrocodone for pain  Please be aware we can only give #30 pills of this to start, so call in 1 wk if needed for refills  OK to increase the gabapentin to 300 mg three times per day  Please continue all other medications as before, and refills have been done if requested.  Please have the pharmacy call with any other refills you may need.  Please continue your efforts at being more active, low cholesterol diet, and weight control.  Please keep your appointments with your specialists as you may have planned  You will be contacted regarding the referral for: referral to orthopedic

## 2019-04-13 NOTE — Assessment & Plan Note (Signed)
Asks for repeat referral to Dr Earlie Server as is having some difficulty with getting a f/u appt
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
## 2019-04-13 NOTE — Assessment & Plan Note (Signed)
stable overall by history and exam, recent data reviewed with pt, and pt to continue medical treatment as before,  to f/u any worsening symptoms or concerns  

## 2019-04-13 NOTE — Assessment & Plan Note (Addendum)
With sever pain and weakness, suspect disc herniation - for pain control, gabapentin, and referral ortho  I spent 41 minutes preparing to see the patient by review of recent labs, imaging and procedures, obtaining and reviewing separately obtained history, communicating with the patient and family or caregiver, ordering medications, tests or procedures, and documenting clinical information in the EHR including the differential Dx, treatment, and any further evaluation and other management of cervical radicullopathy, copd, chf, lung ca

## 2019-04-14 ENCOUNTER — Telehealth: Payer: Self-pay | Admitting: Internal Medicine

## 2019-04-14 NOTE — Telephone Encounter (Signed)
Scheduled appt per 2/2 sch message - pt awre of appt date and time

## 2019-04-17 ENCOUNTER — Encounter: Payer: Self-pay | Admitting: Internal Medicine

## 2019-04-17 ENCOUNTER — Other Ambulatory Visit: Payer: Self-pay

## 2019-04-17 ENCOUNTER — Ambulatory Visit (INDEPENDENT_AMBULATORY_CARE_PROVIDER_SITE_OTHER): Payer: Medicare Other | Admitting: Family Medicine

## 2019-04-17 ENCOUNTER — Ambulatory Visit (INDEPENDENT_AMBULATORY_CARE_PROVIDER_SITE_OTHER): Payer: Medicare Other

## 2019-04-17 ENCOUNTER — Ambulatory Visit (INDEPENDENT_AMBULATORY_CARE_PROVIDER_SITE_OTHER): Payer: Medicare Other | Admitting: *Deleted

## 2019-04-17 ENCOUNTER — Encounter: Payer: Self-pay | Admitting: Family Medicine

## 2019-04-17 VITALS — BP 158/100 | HR 80 | Ht 61.0 in | Wt 172.8 lb

## 2019-04-17 DIAGNOSIS — C3491 Malignant neoplasm of unspecified part of right bronchus or lung: Secondary | ICD-10-CM | POA: Diagnosis not present

## 2019-04-17 DIAGNOSIS — M5412 Radiculopathy, cervical region: Secondary | ICD-10-CM

## 2019-04-17 DIAGNOSIS — M25512 Pain in left shoulder: Secondary | ICD-10-CM | POA: Diagnosis not present

## 2019-04-17 DIAGNOSIS — I5032 Chronic diastolic (congestive) heart failure: Secondary | ICD-10-CM

## 2019-04-17 MED ORDER — OXYCODONE-ACETAMINOPHEN 10-325 MG PO TABS: 1.0000 | ORAL_TABLET | Freq: Three times a day (TID) | ORAL | 0 refills | Status: DC | PRN

## 2019-04-17 MED ORDER — PREDNISONE 5 MG (48) PO TBPK: ORAL_TABLET | ORAL | 0 refills | Status: DC

## 2019-04-17 NOTE — Patient Instructions (Signed)
Thank you for coming in today.  Get xray today.  Start prednisone tomorrow after your labs.  Recheck with me in about 1 week.  Return sooner if needed.    Cervical Radiculopathy  Cervical radiculopathy happens when a nerve in the neck (a cervical nerve) is pinched or bruised. This condition can happen because of an injury to the cervical spine (vertebrae) in the neck, or as part of the normal aging process. Pressure on the cervical nerves can cause pain or numbness that travels from the neck all the way down into the arm and fingers. Usually, this condition gets better with rest. Treatment may be needed if the condition does not improve. What are the causes? This condition may be caused by:  A neck injury.  A bulging (herniated) disk.  Muscle spasms.  Muscle tightness in the neck because of overuse.  Arthritis.  Breakdown or degeneration in the bones and joints of the spine (spondylosis) due to aging.  Bone spurs that may develop near the cervical nerves. What are the signs or symptoms? Symptoms of this condition include:  Pain. The pain may travel from the neck to the arm and hand. The pain can be severe or irritating. It may be worse when you move your neck.  Numbness or tingling in your arm or hand.  Weakness in the affected arm and hand, in severe cases. How is this diagnosed? This condition may be diagnosed based on your symptoms, your medical history, and a physical exam. You may also have tests, including:  X-rays.  A CT scan.  An MRI.  An electromyogram (EMG).  Nerve conduction tests. How is this treated? In many cases, treatment is not needed for this condition. With rest, the condition usually gets better over time. If treatment is needed, options may include:  Wearing a soft neck collar (cervical collar) for short periods of time, as told by your health care provider.  Doing physical therapy to strengthen your neck muscles.  Taking medicines, such as  NSAIDs or oral corticosteroids.  Having spinal injections, in severe cases.  Having surgery. This may be needed if other treatments do not help. Different types of surgery may be done depending on the cause of this condition. Follow these instructions at home: If you have a cervical collar:  Wear it as told by your health care provider. Remove it only as told by your health care provider.  Ask your health care provider if you can remove the collar for cleaning and bathing. If you are allowed to remove the collar for cleaning or bathing: ? Follow instructions from your health care provider about how to remove the collar safely. ? Clean the collar by wiping it with mild soap and water and drying it completely. ? Take out any removable pads in the collar every 1-2 days, and wash them by hand with soap and water. Let them air-dry completely before you put them back in the collar. ? Check your skin under the collar for irritation or sores. If you see any, tell your health care provider. Managing pain      Take over-the-counter and prescription medicines only as told by your health care provider.  If directed, put ice on the affected area. ? If you have a soft neck collar, remove it as told by your health care provider. ? Put ice in a plastic bag. ? Place a towel between your skin and the bag. ? Leave the ice on for 20 minutes, 2-3 times a  day.  If applying ice does not help, you can try using heat. Use the heat source that your health care provider recommends, such as a moist heat pack or a heating pad. ? Place a towel between your skin and the heat source. ? Leave the heat on for 20-30 minutes. ? Remove the heat if your skin turns bright red. This is especially important if you are unable to feel pain, heat, or cold. You may have a greater risk of getting burned.  Try a gentle neck and shoulder massage to help relieve symptoms. Activity  Rest as needed.  Return to your normal  activities as told by your health care provider. Ask your health care provider what activities are safe for you.  Do stretching and strengthening exercises as told by your health care provider or physical therapist.  Do not lift anything that is heavier than 10 lb (4.5 kg) until your health care provider tells you that it is safe. General instructions  Use a flat pillow when you sleep.  Do not drive while wearing a cervical collar. If you do not have a cervical collar, ask your health care provider if it is safe to drive while your neck heals.  Ask your health care provider if the medicine prescribed to you requires you to avoid driving or using heavy machinery.  Do not use any products that contain nicotine or tobacco, such as cigarettes, e-cigarettes, and chewing tobacco. These can delay healing. If you need help quitting, ask your health care provider.  Keep all follow-up visits as told by your health care provider. This is important. Contact a health care provider if:  Your condition does not improve with treatment. Get help right away if:  Your pain gets much worse and cannot be controlled with medicines.  You have weakness or numbness in your hand, arm, face, or leg.  You have a high fever.  You have a stiff, rigid neck.  You lose control of your bowels or your bladder (have incontinence).  You have trouble with walking, balance, or speaking. Summary  Cervical radiculopathy happens when a nerve in the neck is pinched or bruised.  A nerve can get pinched from a bulging disk, arthritis, muscle spasms, or an injury to the neck.  Symptoms include pain, tingling, or numbness radiating from the neck into the arm or hand. Weakness can also occur in severe cases.  Treatment may include rest, wearing a cervical collar, and physical therapy. Medicines may be prescribed to help with pain. In severe cases, injections or surgery may be needed. This information is not intended to  replace advice given to you by your health care provider. Make sure you discuss any questions you have with your health care provider. Document Revised: 01/14/2018 Document Reviewed: 01/14/2018 Elsevier Patient Education  2020 [employer].

## 2019-04-17 NOTE — Progress Notes (Signed)
Subjective:    CC: Neck pain and L UE pain  I, Molly Weber, LAT, ATC, am serving as scribe for Dr. Lynne Leader.  HPI: Pt is a 79 y/o female presenting w/ c/o L-sided neck pain and radiating pain into her L UE below the elbow into her L hand x 2 months that has worsened over the 2 weeks w/ no known MOI.  Pt rates her pain at a 10/10 and describes her pain as sharp and burning .  Radiating pain: Yes into the L UE below the elbow L UE numbness/tingling: Yes in her L hand and L forearm L UE weakness: Yes Aggravating factors: Neck and L UE AROM Treatments tried: Hydrocodone-acetaminophen 7.5-325 mg; Gabapentin 300mg   Diagnostic testing:   Pertinent review of Systems: No new fevers or chills shortness of breath chest pain or palpitation.  Relevant historical information: History of right-sided lung cancer status post x-ray therapy in 2020.   Objective:    Vitals:   04/17/19 1554  BP: (!) 158/100  Pulse: 80  SpO2: 96%   General: Well Developed, well nourished, and in no acute distress.   MSK:  C-spine: Normal-appearing Nontender. Decreased cervical motion. Upper extremity strength intact deltoid triceps biceps wrist flexion extension finger flexion extension.  Finger abduction and adduction and grip. Pulses cap refill/intact distally. Positive left-sided Spurling's test. Cap refill and sensation and pulses are intact distal bilateral extremities.  Left shoulder: Normal-appearing.  Mildly tender palpation diffusely shoulder. Shoulder motion limited abduction by pain beyond 120 degrees.  Normal external and internal rotation. Strength: Abduction 4+/5. External rotation 4+/5. Internal rotation 5/5. Mildly positive Hawkins and Neer's test. Negative Yergason's and speeds test.  Right shoulder: Normal-appearing nontender normal motion normal strength negative impingement testing.  Lab and Radiology Results  X-ray images obtained today personally and independently  reviewed  Left shoulder: Moderate AC DJD.  Minimal glenohumeral DJD.  No acute fractures.  Left-sided ICD present.  No lytic lesions.  C-spine: Mild multilevel DDD moderate more pronounced DDD at C6-7.  No lytic lesions no fractures.  Chest x-ray: Continued collapse right middle lobe no evidence of tumor progression to left superior lung.  Await formal radiology review  Impression and Recommendations:    Assessment and Plan: 79 y.o. female with left arm pain: Likely multifactorial.  Component of cervical radiculopathy certainly likely as well as shoulder pathology.  Patient has multiple medical comorbidities that are going to make treatment and further diagnostic testing somewhat challenging.  At this point we will proceed with x-ray as above.  Left brief treatment with steroid Dosepak and continued trial of gabapentin.  Limited oxycodone for pain control.  Recheck back in about 1 week.  If not any better will proceed with intra-articular shoulder injection at that point.Marland Kitchen  PDMP reviewed during this encounter. Orders Placed This Encounter  Procedures  . DG Cervical Spine Complete    Standing Status:   Future    Number of Occurrences:   1    Standing Expiration Date:   06/14/2020    Order Specific Question:   Reason for Exam (SYMPTOM  OR DIAGNOSIS REQUIRED)    Answer:   eval left cervical radiculopathy. Current lung cancer    Order Specific Question:   Preferred imaging location?    Answer:   [facility]    Order Specific Question:   Radiology Contrast Protocol - do NOT remove file path    Answer:   \\[email or web address]  . DG Chest 2 View  Standing Status:   Future    Number of Occurrences:   1    Standing Expiration Date:   06/14/2020    Order Specific Question:   Reason for Exam (SYMPTOM  OR DIAGNOSIS REQUIRED)    Answer:   left cervical radicular symptoms. Question known lung cancer new growth into apex or brachial plexus?    Order  Specific Question:   Preferred imaging location?    Answer:   [facility]    Order Specific Question:   Radiology Contrast Protocol - do NOT remove file path    Answer:   \\[email or web address].[email or web address]  . DG Shoulder Left    Standing Status:   Future    Number of Occurrences:   1    Standing Expiration Date:   06/14/2020    Order Specific Question:   Reason for Exam (SYMPTOM  OR DIAGNOSIS REQUIRED)    Answer:   eval left shoulder pain. Hx lung cancer    Order Specific Question:   Preferred imaging location?    Answer:   [facility]    Order Specific Question:   Radiology Contrast Protocol - do NOT remove file path    Answer:   \\[email or web address]   Meds ordered this encounter  Medications  . predniSONE (STERAPRED UNI-PAK 48 TAB) 5 MG (48) TBPK tablet    Sig: 12 day dosepack po    Dispense:  48 tablet    Refill:  0  . oxyCODONE-acetaminophen (PERCOCET) 10-325 MG tablet    Sig: Take 1 tablet by mouth every 8 (eight) hours as needed for pain.    Dispense:  15 tablet    Refill:  0    Discussed warning signs or symptoms. Please see discharge instructions. Patient expresses understanding.   The above documentation has been reviewed and is accurate and complete Lynne Leader

## 2019-04-18 ENCOUNTER — Encounter: Payer: Self-pay | Admitting: Internal Medicine

## 2019-04-18 ENCOUNTER — Telehealth: Payer: Self-pay

## 2019-04-18 ENCOUNTER — Inpatient Hospital Stay: Payer: Medicare Other | Attending: Internal Medicine | Admitting: Internal Medicine

## 2019-04-18 ENCOUNTER — Other Ambulatory Visit: Payer: Self-pay

## 2019-04-18 ENCOUNTER — Inpatient Hospital Stay: Payer: Medicare Other

## 2019-04-18 VITALS — BP 117/56 | HR 62 | Temp 98.7°F | Resp 17 | Ht 61.0 in | Wt 172.4 lb

## 2019-04-18 DIAGNOSIS — R531 Weakness: Secondary | ICD-10-CM | POA: Insufficient documentation

## 2019-04-18 DIAGNOSIS — C3481 Malignant neoplasm of overlapping sites of right bronchus and lung: Secondary | ICD-10-CM | POA: Diagnosis not present

## 2019-04-18 DIAGNOSIS — C3490 Malignant neoplasm of unspecified part of unspecified bronchus or lung: Secondary | ICD-10-CM | POA: Diagnosis not present

## 2019-04-18 DIAGNOSIS — C342 Malignant neoplasm of middle lobe, bronchus or lung: Secondary | ICD-10-CM | POA: Diagnosis present

## 2019-04-18 DIAGNOSIS — M2550 Pain in unspecified joint: Secondary | ICD-10-CM | POA: Insufficient documentation

## 2019-04-18 DIAGNOSIS — R0602 Shortness of breath: Secondary | ICD-10-CM | POA: Insufficient documentation

## 2019-04-18 DIAGNOSIS — Z993 Dependence on wheelchair: Secondary | ICD-10-CM | POA: Diagnosis not present

## 2019-04-18 DIAGNOSIS — R911 Solitary pulmonary nodule: Secondary | ICD-10-CM | POA: Diagnosis not present

## 2019-04-18 DIAGNOSIS — Z9981 Dependence on supplemental oxygen: Secondary | ICD-10-CM | POA: Diagnosis not present

## 2019-04-18 DIAGNOSIS — E875 Hyperkalemia: Secondary | ICD-10-CM

## 2019-04-18 LAB — CUP PACEART REMOTE DEVICE CHECK
Battery Remaining Longevity: 37 mo
Battery Remaining Percentage: 44 %
Battery Voltage: 2.96 V
Brady Statistic AP VP Percent: 1 %
Brady Statistic AP VS Percent: 1 %
Brady Statistic AS VP Percent: 99 %
Brady Statistic AS VS Percent: 1 %
Brady Statistic RA Percent Paced: 1 %
Date Time Interrogation Session: 20210209084557
HighPow Impedance: 68 Ohm
HighPow Impedance: 68 Ohm
Implantable Lead Implant Date: 20100930
Implantable Lead Implant Date: 20100930
Implantable Lead Implant Date: 20100930
Implantable Lead Location: 753858
Implantable Lead Location: 753859
Implantable Lead Location: 753860
Implantable Lead Model: 7122
Implantable Pulse Generator Implant Date: 20161215
Lead Channel Impedance Value: 340 Ohm
Lead Channel Impedance Value: 550 Ohm
Lead Channel Impedance Value: 580 Ohm
Lead Channel Pacing Threshold Amplitude: 0.5 V
Lead Channel Pacing Threshold Amplitude: 0.75 V
Lead Channel Pacing Threshold Amplitude: 1 V
Lead Channel Pacing Threshold Pulse Width: 0.5 ms
Lead Channel Pacing Threshold Pulse Width: 0.5 ms
Lead Channel Pacing Threshold Pulse Width: 0.5 ms
Lead Channel Sensing Intrinsic Amplitude: 1.2 mV
Lead Channel Sensing Intrinsic Amplitude: 12 mV
Lead Channel Setting Pacing Amplitude: 2 V
Lead Channel Setting Pacing Amplitude: 2 V
Lead Channel Setting Pacing Amplitude: 2.5 V
Lead Channel Setting Pacing Pulse Width: 0.5 ms
Lead Channel Setting Pacing Pulse Width: 0.5 ms
Lead Channel Setting Sensing Sensitivity: 0.5 mV
Pulse Gen Serial Number: 7247188

## 2019-04-18 LAB — BASIC METABOLIC PANEL - CANCER CENTER ONLY
Anion gap: 8 (ref 5–15)
BUN: 14 mg/dL (ref 8–23)
CO2: 25 mmol/L (ref 22–32)
Calcium: 8.8 mg/dL — ABNORMAL LOW (ref 8.9–10.3)
Chloride: 108 mmol/L (ref 98–111)
Creatinine: 0.78 mg/dL (ref 0.44–1.00)
GFR, Est AFR Am: >60 mL/min (ref >60)
GFR, Estimated: >60 mL/min (ref >60)
Glucose, Bld: 83 mg/dL (ref 70–99)
Potassium: 3.9 mmol/L (ref 3.5–5.1)
Sodium: 141 mmol/L (ref 135–145)

## 2019-04-18 MED ORDER — ALBUTEROL SULFATE HFA 108 (90 BASE) MCG/ACT IN AERS: 2.0000 | INHALATION_SPRAY | Freq: Four times a day (QID) | RESPIRATORY_TRACT | 2 refills | Status: DC | PRN

## 2019-04-18 NOTE — Progress Notes (Signed)
Left shoulder x-ray shows no severe arthritis or evidence of tumor in the bone

## 2019-04-18 NOTE — Addendum Note (Signed)
Addended by: Karle Barr on: 04/18/2019 07:48 PM   Modules accepted: Orders

## 2019-04-18 NOTE — Progress Notes (Signed)
X-ray cervical spine shows multilevel arthritis and potential areas of pinched nerve.  We will discuss this in more detail at follow-up visit in about a week.
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
## 2019-04-18 NOTE — Progress Notes (Signed)
ICD Remote  

## 2019-04-18 NOTE — Telephone Encounter (Signed)
Patient requesting to get a tetanus injection on 04/24/2019 when she is in the building to see Dr Georgina Snell. Is this okay?

## 2019-04-18 NOTE — Patient Instructions (Signed)
Steps to Quit Smoking Smoking tobacco is the leading cause of preventable death. It can affect almost every organ in the body. Smoking puts you and people around you at risk for many serious, long-lasting (chronic) diseases. Quitting smoking can be hard, but it is one of the best things that you can do for your health. It is never too late to quit. How do I get ready to quit? When you decide to quit smoking, make a plan to help you succeed. Before you quit:  Pick a date to quit. Set a date within the next 2 weeks to give you time to prepare.  Write down the reasons why you are quitting. Keep this list in places where you will see it often.  Tell your family, friends, and co-workers that you are quitting. Their support is important.  Talk with your doctor about the choices that may help you quit.  Find out if your health insurance will pay for these treatments.  Know the people, places, things, and activities that make you want to smoke (triggers). Avoid them. What first steps can I take to quit smoking?  Throw away all cigarettes at home, at work, and in your car.  Throw away the things that you use when you smoke, such as ashtrays and lighters.  Clean your car. Make sure to empty the ashtray.  Clean your home, including curtains and carpets. What can I do to help me quit smoking? Talk with your doctor about taking medicines and seeing a counselor at the same time. You are more likely to succeed when you do both.  If you are pregnant or breastfeeding, talk with your doctor about counseling or other ways to quit smoking. Do not take medicine to help you quit smoking unless your doctor tells you to do so. To quit smoking: Quit right away  Quit smoking totally, instead of slowly cutting back on how much you smoke over a period of time.  Go to counseling. You are more likely to quit if you go to counseling sessions regularly. Take medicine You may take medicines to help you quit. Some  medicines need a prescription, and some you can buy over-the-counter. Some medicines may contain a drug called nicotine to replace the nicotine in cigarettes. Medicines may:  Help you to stop having the desire to smoke (cravings).  Help to stop the problems that come when you stop smoking (withdrawal symptoms). Your doctor may ask you to use:  Nicotine patches, gum, or lozenges.  Nicotine inhalers or sprays.  Non-nicotine medicine that is taken by mouth. Find resources Find resources and other ways to help you quit smoking and remain smoke-free after you quit. These resources are most helpful when you use them often. They include:  Online chats with a counselor.  Phone quitlines.  Printed self-help materials.  Support groups or group counseling.  Text messaging programs.  Mobile phone apps. Use apps on your mobile phone or tablet that can help you stick to your quit plan. There are many free apps for mobile phones and tablets as well as websites. Examples include Quit Guide from the CDC and smokefree.gov  What things can I do to make it easier to quit?   Talk to your family and friends. Ask them to support and encourage you.  Call a phone quitline (1-800-QUIT-NOW), reach out to support groups, or work with a counselor.  Ask people who smoke to not smoke around you.  Avoid places that make you want to smoke,   such as: ? Bars. ? Parties. ? Smoke-break areas at work.  Spend time with people who do not smoke.  Lower the stress in your life. Stress can make you want to smoke. Try these things to help your stress: ? Getting regular exercise. ? Doing deep-breathing exercises. ? Doing yoga. ? Meditating. ? Doing a body scan. To do this, close your eyes, focus on one area of your body at a time from head to toe. Notice which parts of your body are tense. Try to relax the muscles in those areas. How will I feel when I quit smoking? Day 1 to 3 weeks Within the first 24 hours,  you may start to have some problems that come from quitting tobacco. These problems are very bad 2-3 days after you quit, but they do not often last for more than 2-3 weeks. You may get these symptoms:  Mood swings.  Feeling restless, nervous, angry, or annoyed.  Trouble concentrating.  Dizziness.  Strong desire for high-sugar foods and nicotine.  Weight gain.  Trouble pooping (constipation).  Feeling like you may vomit (nausea).  Coughing or a sore throat.  Changes in how the medicines that you take for other issues work in your body.  Depression.  Trouble sleeping (insomnia). Week 3 and afterward After the first 2-3 weeks of quitting, you may start to notice more positive results, such as:  Better sense of smell and taste.  Less coughing and sore throat.  Slower heart rate.  Lower blood pressure.  Clearer skin.  Better breathing.  Fewer sick days. Quitting smoking can be hard. Do not give up if you fail the first time. Some people need to try a few times before they succeed. Do your best to stick to your quit plan, and talk with your doctor if you have any questions or concerns. Summary  Smoking tobacco is the leading cause of preventable death. Quitting smoking can be hard, but it is one of the best things that you can do for your health.  When you decide to quit smoking, make a plan to help you succeed.  Quit smoking right away, not slowly over a period of time.  When you start quitting, seek help from your doctor, family, or friends. This information is not intended to replace advice given to you by your health care provider. Make sure you discuss any questions you have with your health care provider. Document Revised: 11/18/2018 Document Reviewed: 05/14/2018 Elsevier Patient Education  2020 Elsevier Inc.  

## 2019-04-18 NOTE — Progress Notes (Signed)
Chest x-ray shows no evidence of tumor involving left upper chest or shoulder

## 2019-04-18 NOTE — Telephone Encounter (Signed)
Yes that will be fine. She is overdue for one.

## 2019-04-20 ENCOUNTER — Other Ambulatory Visit: Payer: Self-pay | Admitting: Internal Medicine

## 2019-04-20 ENCOUNTER — Telehealth: Payer: Self-pay | Admitting: Internal Medicine

## 2019-04-20 NOTE — Telephone Encounter (Signed)
Scheduled per los. Called and spoke with patients son in law. Confirmed appt

## 2019-04-21 ENCOUNTER — Encounter (INDEPENDENT_AMBULATORY_CARE_PROVIDER_SITE_OTHER): Payer: Medicaid Other | Admitting: Ophthalmology

## 2019-04-21 MED ORDER — ALBUTEROL SULFATE HFA 108 (90 BASE) MCG/ACT IN AERS: 2.0000 | INHALATION_SPRAY | Freq: Four times a day (QID) | RESPIRATORY_TRACT | 3 refills | Status: AC | PRN

## 2019-04-21 NOTE — Addendum Note (Signed)
Addended by: Karle Barr on: 04/21/2019 09:23 AM   Modules accepted: Orders

## 2019-04-21 NOTE — Telephone Encounter (Signed)
Contacted pharmacy and they stated that the proair is preferred.  Updated medication list.

## 2019-04-24 ENCOUNTER — Ambulatory Visit: Payer: Medicare Other | Admitting: Family Medicine

## 2019-04-24 ENCOUNTER — Ambulatory Visit: Payer: Medicare Other

## 2019-04-24 NOTE — Progress Notes (Deleted)
   I, Wendy Poet, LAT, ATC, am serving as scribe for Dr. Lynne Leader.  Carolyn [name] is a 79 y.o. female who presents to [place] at [facility] today for f/u of neck and L shoulder pain.  She was last seen by Dr. Georgina Snell on 04/17/19 for 2 month hx of L-sided neck pain and radiating pain into the L UE to the L forearm.  She reports numbness/tingling into her L hand and forearm and L UE weakness.  She is taking a prednisone dose pack, Gabapentin 300 mg and hydrocodone-acetaminophen 7.5-325 mg.  Since her last visit, pt reports   Diagnostic testing: C-spine, L shoulder and chest XR- 04/17/19   Pertinent review of systems: ***  Relevant historical information: ***   Exam:  There were no vitals taken for this visit. General: Well Developed, well nourished, and in no acute distress.   MSK: ***    Lab and Radiology Results No results found for this or any previous visit (from the past 72 hour(s)). No results found.     Assessment and Plan: 79 y.o. female with ***   PDMP not reviewed this encounter. No orders of the defined types were placed in this encounter.  No orders of the defined types were placed in this encounter.    Discussed warning signs or symptoms. Please see discharge instructions. Patient expresses understanding.   ***

## 2019-04-25 ENCOUNTER — Emergency Department (HOSPITAL_COMMUNITY): Payer: Medicare Other

## 2019-04-25 ENCOUNTER — Telehealth: Payer: Self-pay

## 2019-04-25 ENCOUNTER — Encounter (INDEPENDENT_AMBULATORY_CARE_PROVIDER_SITE_OTHER): Payer: Medicare Other | Admitting: Ophthalmology

## 2019-04-25 ENCOUNTER — Encounter (HOSPITAL_COMMUNITY): Payer: Self-pay | Admitting: Emergency Medicine

## 2019-04-25 ENCOUNTER — Inpatient Hospital Stay (HOSPITAL_COMMUNITY): Admit: 2019-04-25 | Payer: Medicare Other

## 2019-04-25 ENCOUNTER — Inpatient Hospital Stay (HOSPITAL_COMMUNITY)
Admission: EM | Admit: 2019-04-25 | Discharge: 2019-05-02 | DRG: 054 | Disposition: A | Discharge status: Skilled Nursing Facility | Payer: Medicare Other | Attending: Internal Medicine | Admitting: Internal Medicine

## 2019-04-25 ENCOUNTER — Other Ambulatory Visit: Payer: Self-pay

## 2019-04-25 ENCOUNTER — Telehealth: Payer: Self-pay | Admitting: Medical Oncology

## 2019-04-25 DIAGNOSIS — Z8042 Family history of malignant neoplasm of prostate: Secondary | ICD-10-CM

## 2019-04-25 DIAGNOSIS — S51811A Laceration without foreign body of right forearm, initial encounter: Secondary | ICD-10-CM | POA: Diagnosis present

## 2019-04-25 DIAGNOSIS — I110 Hypertensive heart disease with heart failure: Secondary | ICD-10-CM | POA: Diagnosis present

## 2019-04-25 DIAGNOSIS — E785 Hyperlipidemia, unspecified: Secondary | ICD-10-CM | POA: Diagnosis present

## 2019-04-25 DIAGNOSIS — G40501 Epileptic seizures related to external causes, not intractable, with status epilepticus: Secondary | ICD-10-CM | POA: Diagnosis present

## 2019-04-25 DIAGNOSIS — Z87440 Personal history of urinary (tract) infections: Secondary | ICD-10-CM

## 2019-04-25 DIAGNOSIS — Z888 Allergy status to other drugs, medicaments and biological substances status: Secondary | ICD-10-CM

## 2019-04-25 DIAGNOSIS — G131 Other systemic atrophy primarily affecting central nervous system in neoplastic disease: Secondary | ICD-10-CM | POA: Diagnosis present

## 2019-04-25 DIAGNOSIS — E119 Type 2 diabetes mellitus without complications: Secondary | ICD-10-CM | POA: Diagnosis present

## 2019-04-25 DIAGNOSIS — Z8719 Personal history of other diseases of the digestive system: Secondary | ICD-10-CM | POA: Diagnosis not present

## 2019-04-25 DIAGNOSIS — K219 Gastro-esophageal reflux disease without esophagitis: Secondary | ICD-10-CM | POA: Diagnosis present

## 2019-04-25 DIAGNOSIS — R5381 Other malaise: Secondary | ICD-10-CM | POA: Diagnosis present

## 2019-04-25 DIAGNOSIS — I10 Essential (primary) hypertension: Secondary | ICD-10-CM | POA: Diagnosis not present

## 2019-04-25 DIAGNOSIS — Z853 Personal history of malignant neoplasm of breast: Secondary | ICD-10-CM

## 2019-04-25 DIAGNOSIS — R0789 Other chest pain: Secondary | ICD-10-CM | POA: Diagnosis present

## 2019-04-25 DIAGNOSIS — C7931 Secondary malignant neoplasm of brain: Principal | ICD-10-CM | POA: Diagnosis present

## 2019-04-25 DIAGNOSIS — M25511 Pain in right shoulder: Secondary | ICD-10-CM | POA: Diagnosis present

## 2019-04-25 DIAGNOSIS — E039 Hypothyroidism, unspecified: Secondary | ICD-10-CM | POA: Diagnosis present

## 2019-04-25 DIAGNOSIS — G8929 Other chronic pain: Secondary | ICD-10-CM | POA: Diagnosis present

## 2019-04-25 DIAGNOSIS — Z7952 Long term (current) use of systemic steroids: Secondary | ICD-10-CM

## 2019-04-25 DIAGNOSIS — F32A Depression, unspecified: Secondary | ICD-10-CM | POA: Diagnosis present

## 2019-04-25 DIAGNOSIS — Z20822 Contact with and (suspected) exposure to covid-19: Secondary | ICD-10-CM | POA: Diagnosis present

## 2019-04-25 DIAGNOSIS — G936 Cerebral edema: Secondary | ICD-10-CM | POA: Diagnosis present

## 2019-04-25 DIAGNOSIS — R569 Unspecified convulsions: Secondary | ICD-10-CM | POA: Diagnosis present

## 2019-04-25 DIAGNOSIS — T1490XA Injury, unspecified, initial encounter: Secondary | ICD-10-CM

## 2019-04-25 DIAGNOSIS — E11649 Type 2 diabetes mellitus with hypoglycemia without coma: Secondary | ICD-10-CM

## 2019-04-25 DIAGNOSIS — I5032 Chronic diastolic (congestive) heart failure: Secondary | ICD-10-CM | POA: Diagnosis present

## 2019-04-25 DIAGNOSIS — Z885 Allergy status to narcotic agent status: Secondary | ICD-10-CM

## 2019-04-25 DIAGNOSIS — R414 Neurologic neglect syndrome: Secondary | ICD-10-CM | POA: Diagnosis present

## 2019-04-25 DIAGNOSIS — I428 Other cardiomyopathies: Secondary | ICD-10-CM | POA: Diagnosis present

## 2019-04-25 DIAGNOSIS — Z79899 Other long term (current) drug therapy: Secondary | ICD-10-CM

## 2019-04-25 DIAGNOSIS — Z79891 Long term (current) use of opiate analgesic: Secondary | ICD-10-CM

## 2019-04-25 DIAGNOSIS — R29714 NIHSS score 14: Secondary | ICD-10-CM | POA: Diagnosis present

## 2019-04-25 DIAGNOSIS — Z87442 Personal history of urinary calculi: Secondary | ICD-10-CM

## 2019-04-25 DIAGNOSIS — Z833 Family history of diabetes mellitus: Secondary | ICD-10-CM

## 2019-04-25 DIAGNOSIS — X58XXXA Exposure to other specified factors, initial encounter: Secondary | ICD-10-CM | POA: Diagnosis not present

## 2019-04-25 DIAGNOSIS — Z882 Allergy status to sulfonamides status: Secondary | ICD-10-CM | POA: Diagnosis not present

## 2019-04-25 DIAGNOSIS — M5416 Radiculopathy, lumbar region: Secondary | ICD-10-CM | POA: Diagnosis present

## 2019-04-25 DIAGNOSIS — F419 Anxiety disorder, unspecified: Secondary | ICD-10-CM | POA: Diagnosis present

## 2019-04-25 DIAGNOSIS — Z8249 Family history of ischemic heart disease and other diseases of the circulatory system: Secondary | ICD-10-CM

## 2019-04-25 DIAGNOSIS — F329 Major depressive disorder, single episode, unspecified: Secondary | ICD-10-CM | POA: Diagnosis present

## 2019-04-25 DIAGNOSIS — Y92239 Unspecified place in hospital as the place of occurrence of the external cause: Secondary | ICD-10-CM | POA: Diagnosis present

## 2019-04-25 DIAGNOSIS — Z95810 Presence of automatic (implantable) cardiac defibrillator: Secondary | ICD-10-CM | POA: Diagnosis not present

## 2019-04-25 DIAGNOSIS — Z7982 Long term (current) use of aspirin: Secondary | ICD-10-CM

## 2019-04-25 DIAGNOSIS — C3491 Malignant neoplasm of unspecified part of right bronchus or lung: Secondary | ICD-10-CM | POA: Diagnosis present

## 2019-04-25 DIAGNOSIS — F17210 Nicotine dependence, cigarettes, uncomplicated: Secondary | ICD-10-CM | POA: Diagnosis present

## 2019-04-25 DIAGNOSIS — Z90710 Acquired absence of both cervix and uterus: Secondary | ICD-10-CM

## 2019-04-25 DIAGNOSIS — G9340 Encephalopathy, unspecified: Secondary | ICD-10-CM | POA: Diagnosis present

## 2019-04-25 DIAGNOSIS — Z794 Long term (current) use of insulin: Secondary | ICD-10-CM

## 2019-04-25 DIAGNOSIS — Z905 Acquired absence of kidney: Secondary | ICD-10-CM

## 2019-04-25 DIAGNOSIS — T380X5A Adverse effect of glucocorticoids and synthetic analogues, initial encounter: Secondary | ICD-10-CM | POA: Diagnosis not present

## 2019-04-25 DIAGNOSIS — I4891 Unspecified atrial fibrillation: Secondary | ICD-10-CM | POA: Diagnosis not present

## 2019-04-25 DIAGNOSIS — G9389 Other specified disorders of brain: Secondary | ICD-10-CM

## 2019-04-25 LAB — APTT: aPTT: 25 seconds (ref 24–36)

## 2019-04-25 LAB — CBG MONITORING, ED
Glucose-Capillary: 285 mg/dL — ABNORMAL HIGH (ref 70–99)
Glucose-Capillary: 272 mg/dL — ABNORMAL HIGH (ref 70–99)
Glucose-Capillary: 267 mg/dL — ABNORMAL HIGH (ref 70–99)

## 2019-04-25 LAB — I-STAT CHEM 8, ED
BUN: 26 mg/dL — ABNORMAL HIGH (ref 8–23)
Calcium, Ion: 1.11 mmol/L — ABNORMAL LOW (ref 1.15–1.40)
Chloride: 100 mmol/L (ref 98–111)
Creatinine, Ser: 0.7 mg/dL (ref 0.44–1.00)
Glucose, Bld: 272 mg/dL — ABNORMAL HIGH (ref 70–99)
HCT: 45 % (ref 36.0–46.0)
Hemoglobin: 15.3 g/dL — ABNORMAL HIGH (ref 12.0–15.0)
Potassium: 3.8 mmol/L (ref 3.5–5.1)
Sodium: 134 mmol/L — ABNORMAL LOW (ref 135–145)
TCO2: 23 mmol/L (ref 22–32)

## 2019-04-25 LAB — CBC
HCT: 46 % (ref 36.0–46.0)
Hemoglobin: 15.2 g/dL — ABNORMAL HIGH (ref 12.0–15.0)
MCH: 29 pg (ref 26.0–34.0)
MCHC: 33 g/dL (ref 30.0–36.0)
MCV: 87.6 fL (ref 80.0–100.0)
Platelets: 316 10*3/uL (ref 150–400)
RBC: 5.25 MIL/uL — ABNORMAL HIGH (ref 3.87–5.11)
RDW: 14.1 % (ref 11.5–15.5)
WBC: 15.8 10*3/uL — ABNORMAL HIGH (ref 4.0–10.5)
nRBC: 0 % (ref 0.0–0.2)

## 2019-04-25 LAB — URINALYSIS, ROUTINE W REFLEX MICROSCOPIC
Bacteria, UA: NONE SEEN
Bilirubin Urine: NEGATIVE
Glucose, UA: 150 mg/dL — AB
Ketones, ur: 20 mg/dL — AB
Leukocytes,Ua: NEGATIVE
Nitrite: NEGATIVE
Protein, ur: 100 mg/dL — AB
Specific Gravity, Urine: >1.046 — ABNORMAL HIGH (ref 1.005–1.030)
pH: 6 (ref 5.0–8.0)

## 2019-04-25 LAB — COMPREHENSIVE METABOLIC PANEL
ALT: 21 U/L (ref 0–44)
AST: 24 U/L (ref 15–41)
Albumin: 4.1 g/dL (ref 3.5–5.0)
Alkaline Phosphatase: 62 U/L (ref 38–126)
Anion gap: 18 — ABNORMAL HIGH (ref 5–15)
BUN: 22 mg/dL (ref 8–23)
CO2: 20 mmol/L — ABNORMAL LOW (ref 22–32)
Calcium: 9.6 mg/dL (ref 8.9–10.3)
Chloride: 94 mmol/L — ABNORMAL LOW (ref 98–111)
Creatinine, Ser: 0.99 mg/dL (ref 0.44–1.00)
GFR calc Af Amer: >60 mL/min (ref >60)
GFR calc non Af Amer: 55 mL/min — ABNORMAL LOW (ref >60)
Glucose, Bld: 256 mg/dL — ABNORMAL HIGH (ref 70–99)
Potassium: 3.9 mmol/L (ref 3.5–5.1)
Sodium: 132 mmol/L — ABNORMAL LOW (ref 135–145)
Total Bilirubin: 1.3 mg/dL — ABNORMAL HIGH (ref 0.3–1.2)
Total Protein: 8 g/dL (ref 6.5–8.1)

## 2019-04-25 LAB — DIFFERENTIAL
Abs Immature Granulocytes: 0.14 10*3/uL — ABNORMAL HIGH (ref 0.00–0.07)
Basophils Absolute: 0 10*3/uL (ref 0.0–0.1)
Basophils Relative: 0 %
Eosinophils Absolute: 0 10*3/uL (ref 0.0–0.5)
Eosinophils Relative: 0 %
Immature Granulocytes: 1 %
Lymphocytes Relative: 10 %
Lymphs Abs: 1.6 10*3/uL (ref 0.7–4.0)
Monocytes Absolute: 1.6 10*3/uL — ABNORMAL HIGH (ref 0.1–1.0)
Monocytes Relative: 10 %
Neutro Abs: 12.5 10*3/uL — ABNORMAL HIGH (ref 1.7–7.7)
Neutrophils Relative %: 79 %

## 2019-04-25 LAB — PROTIME-INR
INR: 1 (ref 0.8–1.2)
Prothrombin Time: 13.5 seconds (ref 11.4–15.2)

## 2019-04-25 LAB — SARS CORONAVIRUS 2 (TAT 6-24 HRS): SARS Coronavirus 2: NEGATIVE

## 2019-04-25 LAB — CK: Total CK: 233 U/L (ref 38–234)

## 2019-04-25 MED ORDER — IOHEXOL 350 MG/ML SOLN
100.0000 mL | Freq: Once | INTRAVENOUS | Status: AC | PRN
  Administered 2019-04-25: 14:00:00 100 mL via INTRAVENOUS

## 2019-04-25 MED ORDER — LORAZEPAM 2 MG/ML IJ SOLN
1.0000 mg | Freq: Once | INTRAMUSCULAR | Status: AC
  Administered 2019-04-25: 15:00:00 1 mg via INTRAVENOUS

## 2019-04-25 MED ORDER — SODIUM CHLORIDE 0.9% FLUSH
3.0000 mL | Freq: Two times a day (BID) | INTRAVENOUS | Status: DC
  Administered 2019-04-25 – 2019-05-02 (×13): 3 mL via INTRAVENOUS

## 2019-04-25 MED ORDER — SODIUM CHLORIDE 0.9% FLUSH: 3.0000 mL | INTRAVENOUS | Status: DC | PRN

## 2019-04-25 MED ORDER — SODIUM CHLORIDE 0.9 % IV SOLN
250.0000 mL | INTRAVENOUS | Status: DC | PRN
  Administered 2019-04-25: 250 mL via INTRAVENOUS

## 2019-04-25 MED ORDER — LORAZEPAM 2 MG/ML IJ SOLN
2.0000 mg | Freq: Once | INTRAMUSCULAR | Status: DC
  Filled 2019-04-25: qty 1

## 2019-04-25 MED ORDER — ACETAMINOPHEN 650 MG RE SUPP: 650.0000 mg | Freq: Four times a day (QID) | RECTAL | Status: DC | PRN

## 2019-04-25 MED ORDER — LEVETIRACETAM IN NACL 1000 MG/100ML IV SOLN
1000.0000 mg | Freq: Two times a day (BID) | INTRAVENOUS | Status: DC
  Administered 2019-04-25 – 2019-05-01 (×12): 1000 mg via INTRAVENOUS
  Filled 2019-04-25 (×12): qty 100

## 2019-04-25 MED ORDER — ACETAMINOPHEN 325 MG PO TABS
650.0000 mg | ORAL_TABLET | Freq: Four times a day (QID) | ORAL | Status: DC | PRN
  Administered 2019-04-29 – 2019-05-01 (×3): 650 mg via ORAL
  Filled 2019-04-25 (×3): qty 2

## 2019-04-25 MED ORDER — LORAZEPAM 2 MG/ML IJ SOLN
2.0000 mg | Freq: Once | INTRAMUSCULAR | Status: AC
  Administered 2019-04-25: 20:00:00 2 mg via INTRAVENOUS
  Filled 2019-04-25: qty 1

## 2019-04-25 MED ORDER — ENOXAPARIN SODIUM 40 MG/0.4ML ~~LOC~~ SOLN
40.0000 mg | SUBCUTANEOUS | Status: DC
  Administered 2019-04-26 – 2019-05-02 (×8): 40 mg via SUBCUTANEOUS
  Filled 2019-04-25 (×8): qty 0.4

## 2019-04-25 MED ORDER — LORAZEPAM 2 MG/ML IJ SOLN
1.0000 mg | Freq: Once | INTRAMUSCULAR | Status: AC
  Administered 2019-04-25: 17:00:00 1 mg via INTRAVENOUS
  Filled 2019-04-25: qty 1

## 2019-04-25 MED ORDER — DEXAMETHASONE SODIUM PHOSPHATE 4 MG/ML IJ SOLN
4.0000 mg | Freq: Two times a day (BID) | INTRAMUSCULAR | Status: DC
  Administered 2019-04-25 – 2019-05-01 (×12): 4 mg via INTRAVENOUS
  Filled 2019-04-25 (×12): qty 1

## 2019-04-25 MED ORDER — LEVETIRACETAM IN NACL 1000 MG/100ML IV SOLN
1000.0000 mg | Freq: Once | INTRAVENOUS | Status: AC
  Administered 2019-04-25: 14:00:00 1000 mg via INTRAVENOUS
  Filled 2019-04-25: qty 100

## 2019-04-25 MED ORDER — METOPROLOL TARTRATE 5 MG/5ML IV SOLN
5.0000 mg | Freq: Once | INTRAVENOUS | Status: AC
  Administered 2019-04-25: 15:00:00 5 mg via INTRAVENOUS
  Filled 2019-04-25: qty 5

## 2019-04-25 MED ORDER — DEXAMETHASONE SODIUM PHOSPHATE 10 MG/ML IJ SOLN
10.0000 mg | INTRAMUSCULAR | Status: AC
  Administered 2019-04-25: 16:00:00 10 mg via INTRAVENOUS
  Filled 2019-04-25: qty 1

## 2019-04-25 MED ORDER — METOPROLOL TARTRATE 5 MG/5ML IV SOLN
5.0000 mg | Freq: Four times a day (QID) | INTRAVENOUS | Status: DC
  Administered 2019-04-25 – 2019-05-01 (×23): 5 mg via INTRAVENOUS
  Filled 2019-04-25 (×23): qty 5

## 2019-04-25 MED ORDER — SODIUM CHLORIDE 0.9 % IV SOLN
2000.0000 mg | INTRAVENOUS | Status: AC
  Administered 2019-04-25: 16:00:00 2000 mg via INTRAVENOUS
  Filled 2019-04-25: qty 20

## 2019-04-25 MED ORDER — SODIUM CHLORIDE 0.9% FLUSH
3.0000 mL | Freq: Two times a day (BID) | INTRAVENOUS | Status: DC
  Administered 2019-04-25 – 2019-05-02 (×12): 3 mL via INTRAVENOUS

## 2019-04-25 MED ORDER — HALOPERIDOL LACTATE 5 MG/ML IJ SOLN
2.0000 mg | Freq: Once | INTRAMUSCULAR | Status: AC
  Administered 2019-04-25: 2 mg via INTRAVENOUS
  Filled 2019-04-25: qty 1

## 2019-04-25 MED ORDER — INSULIN ASPART 100 UNIT/ML ~~LOC~~ SOLN
0.0000 [IU] | SUBCUTANEOUS | Status: DC
  Administered 2019-04-25 (×2): 8 [IU] via SUBCUTANEOUS
  Administered 2019-04-26: 2 [IU] via SUBCUTANEOUS
  Administered 2019-04-26 (×4): 5 [IU] via SUBCUTANEOUS
  Administered 2019-04-26: 20:00:00 8 [IU] via SUBCUTANEOUS
  Administered 2019-04-26: 13:00:00 5 [IU] via SUBCUTANEOUS
  Administered 2019-04-27: 04:00:00 3 [IU] via SUBCUTANEOUS
  Administered 2019-04-27: 2 [IU] via SUBCUTANEOUS
  Administered 2019-04-27: 3 [IU] via SUBCUTANEOUS
  Administered 2019-04-27 – 2019-04-28 (×5): 2 [IU] via SUBCUTANEOUS
  Administered 2019-04-28: 22:00:00 11 [IU] via SUBCUTANEOUS
  Administered 2019-04-28: 3 [IU] via SUBCUTANEOUS
  Administered 2019-04-29: 8 [IU] via SUBCUTANEOUS
  Administered 2019-04-29: 3 [IU] via SUBCUTANEOUS
  Administered 2019-04-29: 13:00:00 5 [IU] via SUBCUTANEOUS
  Administered 2019-04-29: 08:00:00 2 [IU] via SUBCUTANEOUS
  Administered 2019-04-29 – 2019-04-30 (×3): 3 [IU] via SUBCUTANEOUS
  Administered 2019-04-30 (×2): 5 [IU] via SUBCUTANEOUS
  Administered 2019-05-01: 17:00:00 2 [IU] via SUBCUTANEOUS
  Administered 2019-05-01: 13:00:00 5 [IU] via SUBCUTANEOUS
  Administered 2019-05-01: 8 [IU] via SUBCUTANEOUS
  Administered 2019-05-01: 5 [IU] via SUBCUTANEOUS
  Administered 2019-05-01 (×2): 3 [IU] via SUBCUTANEOUS
  Administered 2019-05-02 (×2): 5 [IU] via SUBCUTANEOUS

## 2019-04-25 MED ORDER — LEVOTHYROXINE SODIUM 100 MCG/5ML IV SOLN
25.0000 ug | Freq: Every day | INTRAVENOUS | Status: DC
  Administered 2019-04-26 – 2019-05-01 (×6): 25 ug via INTRAVENOUS
  Filled 2019-04-25 (×7): qty 5

## 2019-04-25 MED ORDER — SODIUM CHLORIDE 0.9% FLUSH
3.0000 mL | Freq: Once | INTRAVENOUS | Status: AC
Start: 2019-04-25 — End: 2019-04-27
  Administered 2019-04-27: 22:00:00 3 mL via INTRAVENOUS

## 2019-04-25 MED ORDER — LIDOCAINE-EPINEPHRINE (PF) 2 %-1:200000 IJ SOLN
20.0000 mL | Freq: Once | INTRAMUSCULAR | Status: AC
  Administered 2019-04-25: 20 mL
  Filled 2019-04-25: qty 20

## 2019-04-25 NOTE — Telephone Encounter (Signed)
FYI-Pt found at home today on the floor and is at [facility].

## 2019-04-25 NOTE — H&P (Signed)
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 vascular enhancement. 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 Contrast-enhanced brain MRI is recommended for further evaluation, if the patient is able to have one. CT perfusion head: The perfusion software identifies no core infarct. The perfusion software identifies no region of critically hypoperfused brain parenchyma utilizing the Tmax>6 seconds threshold. Electronically Signed: By: KylKellie Simmering On: 04/25/2019 14:40   CT C-SPINE NO CHARGE  Result Date: 04/25/2019 CLINICAL DATA:  Trauma. Patient was found down. EXAM: CT CERVICAL SPINE WITHOUT CONTRAST TECHNIQUE: Multidetector CT imaging of the cervical spine was performed without intravenous contrast. Multiplanar CT image reconstructions were also generated. COMPARISON:  Radiographs dated 04/17/2019 and CT scan dated 10/21/2018 FINDINGS: Alignment: Normal. Skull base and vertebrae: No acute fracture. No primary bone lesion or focal pathologic process. Soft tissues and spinal canal: No prevertebral fluid or swelling. No visible canal hematoma. Disc levels: There is disc space  narrowing at C5-6 with a small broad-based disc osteophyte complex with slight left foraminal stenosis. There is disc space narrowing at C6-7. No neural impingement. There is ankylosis of the facet joints bilaterally at C3-4. Severe right facet arthritis at C4-5 without foraminal stenosis. Upper chest: Incompletely visualized irregular faint lesion in the posterior aspect of the right upper lobe as demonstrated on the prior chest CT of 10/21/2018. Other: Aortic atherosclerosis.  AICD in place. IMPRESSION: No acute abnormality of the cervical spine. Degenerative disc and joint disease. Aortic Atherosclerosis (ICD10-I70.0). Electronically Signed   By: Lorriane Shire M.D.   On: 04/25/2019 14:35   CT Code Stroke Cerebral Perfusion with contrast  Addendum Date: 04/25/2019   ADDENDUM REPORT: 04/25/2019 14:45 ADDENDUM: CTA and CT perfusion findings called by telephone at the time of interpretation on 04/25/2019 at 2:44 pm to provider [facility] , who verbally acknowledged these results. Electronically Signed   By: Kellie Simmering DO   On: 04/25/2019 14:45   Result Date: 04/25/2019 CLINICAL DATA:  Focal neuro deficit, greater than 6 hours, stroke suspected. Additional history obtained from Dr. Leonel Ramsay: Left-sided neglect, history of breast cancer. EXAM: CT ANGIOGRAPHY HEAD AND NECK CT PERFUSION BRAIN TECHNIQUE: Multidetector CT imaging of the head and neck was performed using the standard protocol during bolus administration of intravenous contrast. Multiplanar CT image reconstructions and MIPs were obtained to evaluate the vascular anatomy. Carotid stenosis measurements (when applicable) are obtained utilizing NASCET criteria, using the distal internal carotid diameter as the denominator. Multiphase CT imaging of the brain was performed following IV bolus contrast injection. Subsequent parametric perfusion maps were calculated using RAPID software. CONTRAST:  150m OMNIPAQUE IOHEXOL 350 MG/ML SOLN COMPARISON:   Concurrently performed noncontrast head CT 04/25/2019, noncontrast head CT 11/17/2018, CT angiogram chest 02/22/2019 FINDINGS: CTA NECK FINDINGS Aortic arch: Standard aortic branching. Mixed plaque within the visualized aortic arch and proximal major branch vessels of the neck. No high-grade innominate or proximal subclavian artery stenosis. Right carotid system: CCA and ICA patent within the neck without measurable stenosis (50% or greater). Mild scattered calcified plaque within the common carotid artery, carotid bifurcation and proximal right ICA. Left carotid system: CCA and ICA patent within the neck. Mild scattered calcified plaque within the common carotid artery. Moderate mixed plaque within the proximal ICA. Exact quantification of stenosis is difficult due to blooming of calcified plaque. Stenosis within the proximal ICA is estimated at 40%. Distal to this, the ICA is patent within the neck without stenosis. Vertebral arteries: Left vertebral artery dominant. The vertebral arteries are patent within the neck bilaterally without stenosis. Skeleton: No acute bony abnormality. Cervical spondylosis with multilevel posterior disc osteophytes, uncovertebral and facet hypertrophy. Other neck: No neck mass or cervical lymphadenopathy. Upper chest: A right upper lobe pulmonary nodule is again seen and again measures approximately 10 mm in size (series 6, image 323). This finding is again suspicious for metastatic disease. Pulmonary emphysema. Partially visualized left chest pacer/AICD leads. Review of the MIP images confirms the above findings CTA HEAD FINDINGS Anterior circulation: The intracranial right internal carotid artery is patent with scattered mixed plaque. Sites of mild stenosis within the pre cavernous and cavernous segment. The intracranial left internal carotid artery is patent with scattered calcified plaque. Mild/moderate stenosis within the cavernous segment. The M1 middle cerebral arteries are  patent without significant stenosis. No M2 proximal branch occlusion or high-grade proximal stenosis is identified. The anterior cerebral arteries are patent bilaterally without high-grade proximal stenosis. No intracranial aneurysm is identified. Posterior circulation: The non dominant intracranial right vertebral artery is patent and appears to terminate predominantly as the right PICA. The intracranial left vertebral artery is patent. Mild calcified plaque within this vessel without significant stenosis. The basilar artery is patent without significant stenosis. Predominantly fetal origin of the right posterior cerebral artery with hypoplastic right P1 segment. The posterior cerebral arteries are patent bilaterally without significant proximal stenosis. A left posterior  communicating artery is not definitively identified. Venous sinuses: No pertinent prior studies available for comparison. Anatomic variants: As described. Delayed imaging: Delayed venous phase post-contrast imaging demonstrates a 9 mm cortically based enhancing lesion within the right frontoparietal lobes (series 12, image 25). Subtle adjacent leptomeningeal enhancement is also questioned (for instance as seen on series 12, image 24). Additionally, there is a 4 mm enhancing lesion versus asymmetric vascular enhancement within the left cerebellum (series 12, image 9). Review of the MIP images confirms the above findings CT Brain Perfusion Findings: CBF (<30%) Volume: 55m Perfusion (Tmax>6.0s) volume: 041mMismatch Volume: 79m25mnfarction Location:None identified by the perfusion software. IMPRESSION: CTA neck: 1. Atherosclerotic disease as detailed. 2. The right common and internal carotid arteries are patent within the neck without stenosis. 3. The left common and internal carotid arteries are patent within the neck. Moderate mixed plaque results in an estimated 40% stenosis within the proximal left ICA. 4. The bilateral vertebral arteries are  patent within the neck without stenosis. Left vertebral artery dominant. 5. Unchanged 10 mm nodule within the right upper lobe suspicious for metastasis. CTA head: 1. No intracranial large vessel occlusion. 2. Atherosclerotic disease within the intracranial internal carotid arteries. Sites of mild stenosis within the pre cavernous and cavernous right ICA. Sites of mild/moderate stenosis within the cavernous left ICA. 3. 10 mm enhancing cortical lesion within the right frontoparietal lobe. Subtle adjacent leptomeningeal enhancement is also questioned. An additional 4 mm enhancing lesion is questioned within the left cerebellum versus asymmetric vascular enhancement. 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 Contrast-enhanced brain MRI is recommended for further evaluation, if the patient is able to have one. CT perfusion head: The perfusion software identifies no core infarct. The perfusion software identifies no region of critically hypoperfused brain parenchyma utilizing the Tmax>6 seconds threshold. Electronically Signed: By: KylKellie Simmering On: 04/25/2019 14:40   CT HEAD CODE STROKE WO CONTRAST  Result Date: 04/25/2019 CLINICAL DATA:  Code stroke. Possible stroke; cerebral hemorrhage suspected. Additional history obtained from Dr. KirLeonel Ramsayatient presenting with left-sided neglect, history of breast cancer, possible metastases. EXAM: CT HEAD WITHOUT CONTRAST TECHNIQUE: Contiguous axial images were obtained from the base of the skull through the vertex without intravenous contrast. COMPARISON:  Head CT 11/17/2018 FINDINGS: Brain: Mildly motion degraded examination There is no evidence of acute intracranial hemorrhage or acute demarcated cortical infarction. There is a moderate to large region of hypodensity within the right frontoparietal white matter without definite loss of gray-white differentiation. Mild background ill-defined hypoattenuation within the cerebral white matter is  nonspecific, but consistent with chronic small vessel ischemic disease. 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 There is no midline shift or extra-axial fluid collection. Moderate generalized parenchymal atrophy. Vascular: No hyperdense vessel.  Atherosclerotic calcifications. Skull: Normal. Negative for fracture or focal lesion. Sinuses/Orbits: Visualized orbits demonstrate no acute abnormality. No significant paranasal sinus disease or mastoid effusion. These results were called by telephone at the time of interpretation on 04/25/2019 at 2:00 pm to provider Dr. KirLeonel Ramsayho verbally acknowledged these results. IMPRESSION: No evidence of acute intracranial hemorrhage. Moderate to large region of hypoattenuation within the right frontoparietal white matter. Findings are suspicious for vasogenic edema secondary to an underlying mass, possibly related to reported history of breast cancer. An acute white matter infarct cannot be excluded. Contrast-enhanced brain MRI is recommended for further evaluation. No definite loss of gray-white differentiation. Background moderate generalized parenchymal atrophy with mild chronic small vessel ischemic disease. Redemonstrated chronic lacunar infarcts  within bilateral basal ganglia. Electronically Signed   By: Kellie Simmering DO   On: 04/25/2019 14:08    EKG: Independently reviewed. A Fib RVR rate 120s   Assessment/Plan Principal Problem:   Seizure (HCC) Active Problems:   Hypothyroidism   Diabetes ([place])   Hyperlipidemia   Depression   Essential hypertension, benign   Acute encephalopathy   Adenocarcinoma of right lung, stage 2 (HCC)   Acute encephalopathy due to seizure, underlying brain met -CT head: Moderate to large region of hypoattenuation within the right frontoparietal white matter. Findings are suspicious for vasogenic edema secondary to an underlying mass, possibly related to reported history of breast cancer. An  acute white matter infarct cannot be excluded.  -CTA head and neck: The right common and internal carotid arteries are patent within the neck without stenosis. The left common and internal carotid arteries are patent within the neck. No intracranial large vessel occlusion. 10 mm enhancing cortical lesion within the right frontoparietal lobe. Subtle adjacent leptomeningeal enhancement is also questioned. An additional 4 mm enhancing lesion is questioned within the left cerebellum versus asymmetric vascular enhancement. 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  -CT cervical spine: No acute abnormality of the cervical spine -Neurology consulted -Oncology consulted  -Loaded with IV keppra and given IV ativan in the ED as well as IV decadron  -EEG pending  -MRI brain pending   -I discussed patient's case with Dr. Leonel Ramsay and Dr. Alvy Bimler. Oncology would like patient to ideally be at [facility] for radiation evaluation. However, Neurology still concerned that patient may have non-convulsant seizures and need LTM at [facility]. For now, will admit to [place] for neurologic stabilization and eventually plan for transfer to [place] once stable.    A Fib RVR -Lopressor, will change to IV until able to take PO med  -Echo pending  -Now in regular rhythm but tachycardic rate 120s   Stage IIb NSCLC, adenocarcinoma  -Diagnosed August 2020  -S/p SBRT right middle lobe lung mass Oct 2020  -Follows with Dr. Julien Nordmann, last office visit 04/18/2019  -Currently on observation with plan for repeat CT chest in 2 months   DM  -SSI   Hypothyroidism -Synthroid, will change to IV until able to take PO med   HTN -Metoprolol, will change to IV until able to take PO med   HLD -Lipitor, hold until able to take PO med     DVT prophylaxis: Lovenox  Code Status: Presumed full code  Family Communication: None at bedside Disposition Plan: Pending further stabilization, specialist consultation  Consults  called: Neurology, Oncology  Admission status: Inpatient   Severity of Illness: The appropriate patient status for this patient is INPATIENT. Inpatient status is judged to be reasonable and necessary in order to provide the required intensity of service to ensure the patient's safety. The patient's presenting symptoms, physical exam findings, and initial radiographic and laboratory data in the context of their chronic comorbidities is felt to place them at high risk for further clinical deterioration. Furthermore, it is not anticipated that the patient will be medically stable for discharge from the hospital within 2 midnights of admission.   * I certify that at the point of admission it is my clinical judgment that the patient will require inpatient hospital care spanning beyond 2 midnights from the point of admission due to high intensity of service, high risk for further deterioration and high frequency of surveillance required.Dessa Phi, DO [facility] 04/25/2019, 3:38 PM  Available via Epic secure chat 7am-7pm After these hours, please refer to coverage provider listed on [email or web address]

## 2019-04-25 NOTE — ED Notes (Signed)
Pt agitated, pulling out IVs, new IV started, admitting paged.

## 2019-04-25 NOTE — ED Notes (Signed)
Pt resting comfortably at this time.

## 2019-04-25 NOTE — Telephone Encounter (Signed)
FYI: please see below 

## 2019-04-25 NOTE — Telephone Encounter (Signed)
New message    The patient son in law called to explain why the patient did not show up to her appt yesterday.  The patient daughter saw her on the floor this morning in her home called EMS At the hospital now.

## 2019-04-25 NOTE — ED Notes (Signed)
Pt has skin avulsion to right lower forearm. Abrasion to left elbow. Pt has what appears to be the imprint/ink of black slipper on her right outer  Upper thigh.

## 2019-04-25 NOTE — Code Documentation (Signed)
79 yo female coming from home with hx of HTN, adenocarcinoma of lung, CHF, diabetes, and HLP where she lives by herself. LKW at 2130 last night when her son was with her. Pt was found today by her daughter on the ground around 1300 with skin teard  Pt was not following commands, unable to speak, and not moving. EMS called and noted that patient had left gaze preference and would not cross midline with bilateral arm and leg weakness.. EMS activated a Code Stroke. Stroke Team met patient upon arrival to the ED. EMS reports during transport she was able to state her name and started moving extremities. Initial NIHSS 14 upon arrival due to bilateral arm and leg weakness, inability to follow commands, and inability to speak. Pt taken to CT. CT Head noted to have some edema. Not a tPA candidate due to being outside the window. CTA/CTP completed. No LVO noted on exam. Pt returned to ED and placed on cardiac monitor. Handoff given to [facility], Therapist, sports.

## 2019-04-25 NOTE — ED Notes (Signed)
Attempted report x1. 

## 2019-04-25 NOTE — ED Notes (Signed)
Pt's dtr permitted to stay with pt due to confusion.

## 2019-04-25 NOTE — Procedures (Addendum)
Patient Name: Carolyn [name]  MRN: [id number]  Epilepsy Attending: Lora Havens  Referring Physician/Provider: Dr Zane Herald Date: 04/25/2019  Duration: 42.27 mins  Patient history: Carolyn [name] is a 79 y.o. female with a history of adenocarcinoma of the lung who presents after being found down and unresponsive by her daughter today. She also had a left gaze preference per EMS on arrival.  She was not speaking at all on their arrival, began improving on route. EEG to evaluate for status.  Level of alertness: lethargic  AEDs during EEG study: Ativan  Technical aspects: This EEG study was done with scalp electrodes positioned according to the 10-20 International system of electrode placement. Electrical activity was acquired at a sampling rate of 500Hz  and reviewed with a high frequency filter of 70Hz  and a low frequency filter of 1Hz . EEG data were recorded continuously and digitally stored.   DESCRIPTION: No clear posterior dominant rhythm was seen. EEG showed continuous generalized polymorphic high amplitude 5-6hz  theta slowing. IV ativan was administered during the study after which per neurologist Dr Leonel Ramsay, patient clinically appeared improved. EEG continued to show continuous 5-6hz  theta slowing with lower amplitude. Hyperventilation and photic stimulation were not performed.  ABNORMALITY - Continuous slow, generalized  IMPRESSION: This study is suggestive of moderate diffuse encephalopathy, non specific to etiology. Per bedside neurologist, patient improved after IV ativan which although not specific does raise suspicion for ictal/post-ictal activity. No definite seizures or epileptiform discharges were seen throughout the recording.  Carolyn [name]

## 2019-04-25 NOTE — Progress Notes (Signed)
Arrived at room to s/u vLTM pt still very confused and combative.  Will attempt vLTM at a later time when pt is more cooperative.RN aware and in pt room when REEGT arrived

## 2019-04-25 NOTE — Telephone Encounter (Signed)
Received a phone call regarding patient, patient is in the ED currently getting CT scan was found on the floor yesterday by daughter. They were wanting to call the doctors offices to inform them why patient missed appointments or why she wouldn't be in. They are not sure when she will reschedule.

## 2019-04-25 NOTE — Progress Notes (Signed)
EEG completed, results pending. 

## 2019-04-25 NOTE — ED Notes (Signed)
Pt moving bilateral arms and legs. Pt is able to talk but is very confused. HR 170's. EDP and neuro at bedside

## 2019-04-25 NOTE — ED Triage Notes (Signed)
Pt brought in by EMS- code stroke. LSN last night at 9:30 pm. Pt lives by herself is normally able to walk and care for herself. Family found her on the floor at 1pm today. Pt aphasic, not following commands. Per EMS- pt could not cross midline with left sided gaze. Upon arrival, pt pupils equal and able to cross midline. Pt still not responsive verbally. BP 187/115, HR 87.
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
## 2019-04-25 NOTE — ED Notes (Signed)
2mg  IV ativan ordered, verbal order Dr. Cheral Marker.

## 2019-04-25 NOTE — ED Notes (Signed)
EEG at bedside.

## 2019-04-26 ENCOUNTER — Inpatient Hospital Stay (HOSPITAL_COMMUNITY): Payer: Medicare Other

## 2019-04-26 ENCOUNTER — Ambulatory Visit: Payer: Medicare Other | Admitting: Family Medicine

## 2019-04-26 DIAGNOSIS — I4891 Unspecified atrial fibrillation: Secondary | ICD-10-CM

## 2019-04-26 DIAGNOSIS — I10 Essential (primary) hypertension: Secondary | ICD-10-CM

## 2019-04-26 LAB — CBC
HCT: 43.5 % (ref 36.0–46.0)
Hemoglobin: 14.4 g/dL (ref 12.0–15.0)
MCH: 28.7 pg (ref 26.0–34.0)
MCHC: 33.1 g/dL (ref 30.0–36.0)
MCV: 86.7 fL (ref 80.0–100.0)
Platelets: 312 10*3/uL (ref 150–400)
RBC: 5.02 MIL/uL (ref 3.87–5.11)
RDW: 14.4 % (ref 11.5–15.5)
WBC: 17.4 10*3/uL — ABNORMAL HIGH (ref 4.0–10.5)
nRBC: 0 % (ref 0.0–0.2)

## 2019-04-26 LAB — BASIC METABOLIC PANEL
Anion gap: 20 — ABNORMAL HIGH (ref 5–15)
BUN: 25 mg/dL — ABNORMAL HIGH (ref 8–23)
CO2: 16 mmol/L — ABNORMAL LOW (ref 22–32)
Calcium: 9.1 mg/dL (ref 8.9–10.3)
Chloride: 102 mmol/L (ref 98–111)
Creatinine, Ser: 1.02 mg/dL — ABNORMAL HIGH (ref 0.44–1.00)
GFR calc Af Amer: >60 mL/min (ref >60)
GFR calc non Af Amer: 53 mL/min — ABNORMAL LOW (ref >60)
Glucose, Bld: 254 mg/dL — ABNORMAL HIGH (ref 70–99)
Potassium: 4 mmol/L (ref 3.5–5.1)
Sodium: 138 mmol/L (ref 135–145)

## 2019-04-26 LAB — GLUCOSE, CAPILLARY
Glucose-Capillary: 238 mg/dL — ABNORMAL HIGH (ref 70–99)
Glucose-Capillary: 240 mg/dL — ABNORMAL HIGH (ref 70–99)
Glucose-Capillary: 246 mg/dL — ABNORMAL HIGH (ref 70–99)
Glucose-Capillary: 224 mg/dL — ABNORMAL HIGH (ref 70–99)
Glucose-Capillary: 220 mg/dL — ABNORMAL HIGH (ref 70–99)

## 2019-04-26 LAB — ECHOCARDIOGRAM COMPLETE: Weight: 2659.63 oz

## 2019-04-26 MED ORDER — INSULIN DETEMIR 100 UNIT/ML ~~LOC~~ SOLN
12.0000 [IU] | Freq: Two times a day (BID) | SUBCUTANEOUS | Status: DC
  Administered 2019-04-26 – 2019-04-29 (×7): 12 [IU] via SUBCUTANEOUS
  Filled 2019-04-26 (×8): qty 0.12

## 2019-04-26 MED ORDER — SODIUM CHLORIDE 0.9 % IV SOLN: INTRAVENOUS | Status: DC

## 2019-04-26 MED ORDER — QUETIAPINE FUMARATE 25 MG PO TABS: 25.0000 mg | ORAL_TABLET | Freq: Two times a day (BID) | ORAL | Status: DC

## 2019-04-26 MED ORDER — LORAZEPAM 2 MG/ML IJ SOLN: 1.0000 mg | Freq: Once | INTRAMUSCULAR | Status: DC

## 2019-04-26 MED ORDER — LORAZEPAM 2 MG/ML IJ SOLN
1.0000 mg | INTRAMUSCULAR | Status: DC | PRN
  Administered 2019-04-27: 09:00:00 1 mg via INTRAVENOUS
  Filled 2019-04-26: qty 1

## 2019-04-26 NOTE — Progress Notes (Signed)
Echocardiogram 2D Echocardiogram has been performed.  Oneal Deputy Dory Demont 04/26/2019, 2:34 PM

## 2019-04-26 NOTE — Progress Notes (Addendum)
PROGRESS NOTE  Carolyn [name] TDD:[id number] DOB: 18-Jul-1940 DOA: 04/25/2019 PCP: Biagio Borg, MD  Brief History   Carolyn [name] is a 79 y.o. female with medical history significant of stage IIb non-small cell lung cancer, adenocarcinoma followed by Dr. Julien Nordmann, HLD, depression, DM type 2, hypothyroidism, HTN who was found unresponsive at home.  Patient remains confused on my examination, history gathered from chart review and ER physician.  Apparently, patient was last seen by her son yesterday morning.  Then this morning, was found by her daughter laying on the floor with some bruising and skin tears.  Had left gaze preference per EMS on arrival.  She was not verbal per EMS, although this began to improve on route to the hospital.  On my examination, she is able to answer some questions, remains confused.  She thinks that she is at home, year 58.  She is grabbing onto the side of the stretcher, states that there is "a bone here."  She is able to follow some commands but not all.  ED Course: Arrived to ED as code stroke, Neurology was consulted. CT head suspicious for vasogenic edema secondary to underlying mass. Concern for seizure activity and was loaded with IV keppra and given IV ativan. Forearm laceration repaired by EDP. Patient went into A Fib RVR during ED stay and given IV lopressor with improvement in HR.   Although the patient was planned to undergo MRI today, this will not be possible as the patient has had an AICD/pacer placed on 02/21/2015. Dr. Rodney Booze feels that data that may be obtained from CT head is sufficient.  The plan is to transfer the patient to [facility] for evaluation for radiation once neurological evaluation is completed and she is stable for transfer.   Consultants  . Oncology . Neurology  Procedures  . EEG  Antibiotics   Anti-infectives (From admission, onward)   None    .  Subjective  The patient is awake and alert. She is very confused and somewhat  agitated. She complains of pain in her right shoulder where she had a rotator cuff repair remotely.   Objective   Vitals:  Vitals:   04/26/19 1345 04/26/19 1544  BP: 119/63 124/65  Pulse:  74  Resp:  18  Temp:  97.7 F (36.5 C)  SpO2:  97%   Exam:  Constitutional:  . The patient is awake, alert, and ver confused. No acute distress. Respiratory:  . No increased work of breathing. . No wheezes, rales, or rhonchi . No tactile fremitus Cardiovascular:  . Regular rate and rhythm . No murmurs, ectopy, or gallups. . No lateral PMI. No thrills. Abdomen:  . Abdomen is soft, non-tender, non-distended . No hernias, masses, or organomegaly . Normoactive bowel sounds.  Musculoskeletal:  . No cyanosis, clubbing, or edema Skin:  . No rashes, lesions, ulcers . palpation of skin: no induration or nodules Neurologic:  . CN 2-12 intact . Sensation all 4 extremities intact . Moving all extremities Psychiatric:  . Mental status: Confused, but upbeat  . judgment and insight appear intact: Poor  I have personally reviewed the following:   Today's Data  . Vitals, Glucoses, CBC, BMP  Imaging  . CT Brain  Cardiology Data  . EKG  Scheduled Meds: . dexamethasone (DECADRON) injection  4 mg Intravenous Q12H  . enoxaparin (LOVENOX) injection  40 mg Subcutaneous Q24H  . insulin aspart  0-15 Units Subcutaneous Q4H  . insulin detemir  12 Units Subcutaneous BID  .  levothyroxine  25 mcg Intravenous Daily  . metoprolol tartrate  5 mg Intravenous Q6H  . sodium chloride flush  3 mL Intravenous Once  . sodium chloride flush  3 mL Intravenous Q12H  . sodium chloride flush  3 mL Intravenous Q12H   Continuous Infusions: . sodium chloride Stopped (04/26/19 0002)  . sodium chloride 50 mL/hr at 04/26/19 0935  . levETIRAcetam 1,000 mg (04/26/19 0941)    Principal Problem:   Seizure ([place]) Active Problems:   Hypothyroidism   Diabetes ([place])   Hyperlipidemia   Depression   Essential  hypertension, benign   Acute encephalopathy   Adenocarcinoma of right lung, stage 2 (HCC)   LOS: 1 day   A & P  Acute encephalopathy due to seizure, underlying brain met: Likely due to apparent brain met. There is a moderate to large region of hypoattenuation within the right frontoparietal white matter. Findings are suspicious for vasogenic edema secondary to an underlying mass, possibly related to reported history of breast cancer. An acute white matter infarct cannot be excluded. CTA head and neck demonstrates patency of the right common and internal carotid arteries are patent within the neck without stenosis. The left common and internal carotid arteries are patent within the neck. No intracranial large vessel occlusion. 10 mm enhancing cortical lesion within the right frontoparietal lobe. Subtle adjacent leptomeningeal enhancement is also questioned. An additional 4 mm enhancing lesion is questioned within the left cerebellum versus asymmetric vascular enhancement. 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 CT cervical spine demonstrates no acute abnormality of the cervical spine. MRI not possible due to AICD and pacemaker. Dr. Rodney Booze states that he feels that all the information he needs is available in the CT. Neurology and oncology have been consulted. Oncology wants for the patient to be transferred to [facility] when neuro work up completed and patient is stable. LTM EEG has been initiated.  A Fib RVR: Rate has been controlled on IV lopressor. Echocardiogram has been completed. Report is pending.   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 It was originally diagnosed in August of 2020. She is s/p SBRT right middle lobe lung mass Oct 2020. She follows with Dr. Julien Nordmann, last office visit 04/18/2019. She will be transferred to [facility] when stable and LTM EEG is completed for evaluation for radiation therapy. She had been on observation prior to  admission with plan for repeat CT chest in 2 months to follow mass.  DM II: Sugars will be followed with FSBS and SSI. She has been started on BID levemir. Sugars have been elevated due to steroids.  Hypothyroidism: Continue IV synthroid.  HTN: Metoprolol, will change to IV until able to take PO med.  HLD: Lipitor, hold until able to take PO med   I have seen and examined this patient myself. I have spent 38 minutes in her evaluation and care.  DVT prophylaxis: Lovenox  Code Status: Presumed full code  Family Communication: None at bedside Disposition Plan: Pending further stabilization, specialist consultation   Amaka Gluth, DO [facility] Direct contact: see [email or web address]  7PM-7AM contact night coverage as above 04/26/2019, 4:12 PM  LOS: 1 day

## 2019-04-26 NOTE — Progress Notes (Signed)
Echo attempted at 9:15, patient confused and uncooperative. Will re-attempt later. [place]

## 2019-04-26 NOTE — Progress Notes (Signed)
Inpatient Diabetes Program Recommendations  AACE/ADA: New Consensus Statement on Inpatient Glycemic Control (2015)  Target Ranges:  Prepandial:   less than 140 mg/dL      Peak postprandial:   less than 180 mg/dL (1-2 hours)      Critically ill patients:  140 - 180 mg/dL   Lab Results  Component Value Date   GLUCAP 246 (H) 04/26/2019   HGBA1C 6.9 (H) 02/06/2019    Review of Glycemic Control Results for REGENIA, ERCK (MRN [id number]) as of 04/26/2019 10:39  Ref. Range 04/25/2019 17:31 04/25/2019 19:16 04/26/2019 01:18 04/26/2019 04:15 04/26/2019 07:32  Glucose-Capillary Latest Ref Range: 70 - 99 mg/dL 272 (H) 267 (H) 238 (H) 240 (H) 246 (H)   Diabetes history: DM2 Outpatient Diabetes medications: Lantus 37 units + Januvia 100 mg qd + Actos 30 mg qd Current orders for Inpatient glycemic control: Novolog moderate correction q 4 hrs + Decadron 4 mg q 12 hrs.  Inpatient Diabetes Program Recommendations:   -Add Levemir 15 units bid and adjust as needed Will follow.  Thank you, Nani Gasser. Shizuko Wojdyla, RN, MSN, CDE  Diabetes Coordinator Inpatient Glycemic Control Team Team Pager [phone] (8am-5pm) 04/26/2019 10:42 AM

## 2019-04-26 NOTE — Progress Notes (Signed)
vLTM EEG started. No skin breakdown noted. Notified neuro

## 2019-04-26 NOTE — Progress Notes (Signed)
Bladder scan was 611 no urine out put today just one episode of incontinence in the morning Dr Benny Lennert notified

## 2019-04-26 NOTE — Progress Notes (Signed)
Subjective: Continues to be confused, unable to perform LTM due to agitation  Exam: Vitals:   04/26/19 0741 04/26/19 1131  BP: 116/71 103/65  Pulse: 90 91  Resp: 18 16  Temp: (!) 97.4 F (36.3 C) 98.5 F (36.9 C)  SpO2: 97% 96%   Gen: In bed, NAD Resp: non-labored breathing, no acute distress Abd: soft, nt  Neuro: MS: Awake, tells me her name, gives age is 24, not very cooperative. CN: Eyes are midline, she keeps eyes tightly closed and resist checking extraocular movements Motor: She moves all of her extremities voluntarily Sensory: Response to noxious stimulation in all 4 extremities  Pertinent Labs: Creatinine 1.0  Impression: 79 year old female with acute onset of confusional state in the setting of brain metastasis.  She was on the ground for likely a very long period of time, and her clinical response to Ativan yesterday makes me strongly suspect that she was in status epilepticus for a prolonged period of time.  Is possible that she continues to have subclinical seizures, therefore I have favored LTM monitoring.  She continues to be better than she was on arrival, though still very confused.  Recommendations: 1) LTM EEG 2) continue Keppra 3) I would favor Ativan rather than Haldol if she continues to be agitated or confused  4) neurology will continue to follow  Roland Rack, MD [facility] [phone]  If 7pm- 7am, please page neurology on call as listed in [place].

## 2019-04-26 NOTE — Progress Notes (Signed)
Updates given to the patients daughter.

## 2019-04-27 ENCOUNTER — Other Ambulatory Visit: Payer: Self-pay | Admitting: Radiation Therapy

## 2019-04-27 LAB — BASIC METABOLIC PANEL
Anion gap: 12 (ref 5–15)
BUN: 39 mg/dL — ABNORMAL HIGH (ref 8–23)
CO2: 22 mmol/L (ref 22–32)
Calcium: 8.7 mg/dL — ABNORMAL LOW (ref 8.9–10.3)
Chloride: 105 mmol/L (ref 98–111)
Creatinine, Ser: 1.09 mg/dL — ABNORMAL HIGH (ref 0.44–1.00)
GFR calc Af Amer: 57 mL/min — ABNORMAL LOW (ref >60)
GFR calc non Af Amer: 49 mL/min — ABNORMAL LOW (ref >60)
Glucose, Bld: 163 mg/dL — ABNORMAL HIGH (ref 70–99)
Potassium: 4 mmol/L (ref 3.5–5.1)
Sodium: 139 mmol/L (ref 135–145)

## 2019-04-27 LAB — GLUCOSE, CAPILLARY
Glucose-Capillary: 280 mg/dL — ABNORMAL HIGH (ref 70–99)
Glucose-Capillary: 121 mg/dL — ABNORMAL HIGH (ref 70–99)
Glucose-Capillary: 155 mg/dL — ABNORMAL HIGH (ref 70–99)
Glucose-Capillary: 139 mg/dL — ABNORMAL HIGH (ref 70–99)
Glucose-Capillary: 131 mg/dL — ABNORMAL HIGH (ref 70–99)
Glucose-Capillary: 147 mg/dL — ABNORMAL HIGH (ref 70–99)
Glucose-Capillary: 180 mg/dL — ABNORMAL HIGH (ref 70–99)
Glucose-Capillary: 123 mg/dL — ABNORMAL HIGH (ref 70–99)

## 2019-04-27 NOTE — Progress Notes (Signed)
I spoke with the patient's son in law Jesus Genera and included him in the discussion that I've had with Dr. Lisbeth Renshaw, Dr. Mickeal Skinner, and Dr. Benny Lennert. At this point after we've reviewed her case, it appears the patient has been postictal following her episode that brought her into the hospital. The current differential is that she had a stroke that prompted this event, or underlying brain tumor. There are limitations in her imaging given the inability to perform an MRI with her ICD. She would benefit from a CT of the brain with contrast, and would be best suited for this as an outpatient given her enceophalopathy that is likely the result of her postictal state on benzodiazepines. Dr. Mickeal Skinner will plan to see her as an outpatient on Monday next week, and order her next set of CT scans. If she has underlying tumor, it would be best to try for focal radiotherapy rather than whole brain treatment. Mr. Hassell Done is in agreement with this plan as well. If she is felt to have metastatic disease to the brain, we will meet back with the patient and her family to formulate more definitive treatment plans.     Carola Rhine, PAC

## 2019-04-27 NOTE — Progress Notes (Signed)
PROGRESS NOTE  Carolyn [name]:[id number] DOB: Feb 21, 1941 DOA: 04/25/2019 PCP: Biagio Borg, MD  Brief History   Carolyn [name] is a 79 y.o. female with medical history significant of stage IIb non-small cell lung cancer, adenocarcinoma followed by Dr. Julien Nordmann, HLD, depression, DM type 2, hypothyroidism, HTN who was found unresponsive at home.  Patient remains confused on my examination, history gathered from chart review and ER physician.  Apparently, patient was last seen by her son yesterday morning.  Then this morning, was found by her daughter laying on the floor with some bruising and skin tears.  Had left gaze preference per EMS on arrival.  She was not verbal per EMS, although this began to improve on route to the hospital.  On my examination, she is able to answer some questions, remains confused.  She thinks that she is at home, year 80.  She is grabbing onto the side of the stretcher, states that there is "a bone here."  She is able to follow some commands but not all.  ED Course: Arrived to ED as code stroke, Neurology was consulted. CT head suspicious for vasogenic edema secondary to underlying mass. Concern for seizure activity and was loaded with IV keppra and given IV ativan. Forearm laceration repaired by EDP. Patient went into A Fib RVR during ED stay and given IV lopressor with improvement in HR.   Although the patient was planned to undergo MRI today, this will not be possible as the patient has had an AICD/pacer placed on 02/21/2015. I have discussed this with [place] cardiology who has confirmed this. Dr. Rodney Booze feels that data that may be obtained from CT head is sufficient. Radiation oncology is considering radiation therapy given this study will not be possible.  The plan is to transfer the patient to [facility] for evaluation for radiation once neurological evaluation is completed and she is stable for transfer. LTM EEG is pending.   Consultants   . Oncology . Neurology  Procedures  . EEG  Antibiotics   Anti-infectives (From admission, onward)   None     Subjective  The patient is awake and alert. She is continues to be confused. No new complaints.  Objective   Vitals:  Vitals:   04/27/19 0813 04/27/19 1242  BP: (!) 163/66 (!) 147/124  Pulse: 63 71  Resp: 20 17  Temp: 98.3 F (36.8 C) 98.2 F (36.8 C)  SpO2: 99% 99%   Exam:  Constitutional:  . The patient is awake, alert, and confused. No acute distress. Respiratory:  . No increased work of breathing. . No wheezes, rales, or rhonchi . No tactile fremitus Cardiovascular:  . Regular rate and rhythm . No murmurs, ectopy, or gallups. . No lateral PMI. No thrills. Abdomen:  . Abdomen is soft, non-tender, non-distended . No hernias, masses, or organomegaly . Normoactive bowel sounds.  Musculoskeletal:  . No cyanosis, clubbing, or edema Skin:  . No rashes, lesions, ulcers . palpation of skin: no induration or nodules Neurologic:  . CN 2-12 intact . Sensation all 4 extremities intact . Moving all extremities Psychiatric:  . Mental status: Confused, but upbeat  . judgment and insight appear intact: Poor  I have personally reviewed the following:   Today's Data  . Vitals, Glucoses, BMP  Imaging  . CT Brain  Cardiology Data  . EKG  Scheduled Meds: . dexamethasone (DECADRON) injection  4 mg Intravenous Q12H  . enoxaparin (LOVENOX) injection  40 mg Subcutaneous Q24H  . insulin aspart  0-15 Units Subcutaneous Q4H  . insulin detemir  12 Units Subcutaneous BID  . levothyroxine  25 mcg Intravenous Daily  . metoprolol tartrate  5 mg Intravenous Q6H  . sodium chloride flush  3 mL Intravenous Once  . sodium chloride flush  3 mL Intravenous Q12H  . sodium chloride flush  3 mL Intravenous Q12H   Continuous Infusions: . sodium chloride Stopped (04/26/19 0002)  . sodium chloride 50 mL/hr at 04/27/19 0657  . levETIRAcetam 1,000 mg (04/27/19 0932)     Principal Problem:   Seizure ([place]) Active Problems:   Hypothyroidism   Diabetes ([place])   Hyperlipidemia   Depression   Essential hypertension, benign   Acute encephalopathy   Adenocarcinoma of right lung, stage 2 (HCC)   LOS: 2 days   A & P  Acute encephalopathy due to seizure, underlying brain met: Likely due to apparent brain met. There is a moderate to large region of hypoattenuation within the right frontoparietal white matter. Findings are suspicious for vasogenic edema secondary to an underlying mass, possibly related to reported history of breast cancer. An acute white matter infarct cannot be excluded. CTA head and neck demonstrates patency of the right common and internal carotid arteries are patent within the neck without stenosis. The left common and internal carotid arteries are patent within the neck. No intracranial large vessel occlusion. 10 mm enhancing cortical lesion within the right frontoparietal lobe. Subtle adjacent leptomeningeal enhancement is also questioned. An additional 4 mm enhancing lesion is questioned within the left cerebellum versus asymmetric vascular enhancement. 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 CT cervical spine demonstrates no acute abnormality of the cervical spine. MRI not possible due to AICD and pacemaker. I have discussed this with [facility] and they have confirmed this. Dr. Rodney Booze states that he feels that all the information he needs is available in the CT. Neurology and oncology have been consulted. Oncology wants for the patient to be transferred to [facility] when neuro work up completed and patient is stable. LTM EEG has been initiated, and report is pending. Waiting to hear from [place] whether or not the patient should be transferred to [facility] for initiation of radiation therapy.   A Fib RVR: Rate has been controlled on IV lopressor. Echocardiogram was performed on 04/26/2019. It has demonstrated a mildly decreased EF  with mild hypokinesis of the basal infero septum and inferolateral walls. EF is 45-50% with mildly decreased function of the LV. Diastolic parameters are indeterminate. There is normal PA pressures. There is no evidence of mitral or aortic stenosis.  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 It was originally diagnosed in August of 2020. She is s/p SBRT right middle lobe lung mass Oct 2020. She follows with Dr. Julien Nordmann, last office visit 04/18/2019. She will be transferred to [facility] when stable and LTM EEG is completed for evaluation for radiation therapy. She had been on observation prior to admission with plan for repeat CT chest in 2 months to follow mass. Pt also has history of BRCA.   DM II: Sugars will be followed with FSBS and SSI. She has been started on BID levemir. Sugars have been elevated due to steroids.Glucoses over the past 24 hours have been 121 - 280.  Hypothyroidism: Continue IV synthroid.  HTN: Metoprolol, will change to IV until able to take PO med.  HLD: Lipitor, hold until able to take PO med   I have seen and examined this patient myself. I have spent  32 minutes in her evaluation and care.  DVT prophylaxis: Lovenox  Code Status: Full code  Family Communication: None at bedside Disposition Plan: Probable transfer to [facility] for evaluation for radiation therapy.  Waneda Klammer, DO [facility] Direct contact: see [email or web address]  7PM-7AM contact night coverage as above 04/27/2019, 3:10 PM  LOS: 1 day

## 2019-04-27 NOTE — Progress Notes (Addendum)
Our service is aware of this patient. Lack of MRI is difficult in this situation. I will discuss this with Dr. Lisbeth Renshaw. Formal consult to follow.    Carola Rhine, PAC

## 2019-04-27 NOTE — Procedures (Addendum)
Patient Name: [name]  MRN: [id number]  Epilepsy Attending: Lora Havens  Referring Physician/Provider: Dr Zane Herald Duration:  04/26/2019 1154 to 04/27/2019 1124  Patient history: Carolyn [name] Smithis a 79 y.o.femalewith a history of adenocarcinoma of the lung who presents after being found down and unresponsive by her daughter today. She also had a left gaze preference per EMS on arrival. She was not speaking at all on their arrival,began improving on route. EEG to evaluate for status.  Level of alertness:  Awake, sleep  AEDs during EEG study:  Keppra  Technical aspects: This EEG study was done with scalp electrodes positioned according to the 10-20 International system of electrode placement. Electrical activity was acquired at a sampling rate of 500Hz  and reviewed with a high frequency filter of 70Hz  and a low frequency filter of 1Hz . EEG data were recorded continuously and digitally stored.   DESCRIPTION:  The posterior dominant rhythm consists of 7.5-8 Hz activity of moderate voltage (25-35 uV) seen predominantly in posterior head regions, symmetric and reactive to eye opening and eye closing.      Sleep was characterized by vertex waves, sleep spindles (13 to 15 Hz), maximal frontocentral as well as REM sleep.  EEG showed continuous generalized 5 to 7 Hz theta slowing. Spikes were also seen in the right parieto-occipital region, maximal P4/P8/O2  ABNORMALITY - Continuous slow, generalized - Spike, right parieto-occipital region, maximal P4/P8/O2  IMPRESSION: This study is suggestive of epileptogenicity arising from the right parieto-occipital region as well as mild diffuse encephalopathy, non specific to etiology. No definite seizures were seen throughout the recording.  EEG appears to be improving compared to previous routine study on 04/25/2019.  Leighann Amadon Barbra Sarks

## 2019-04-27 NOTE — Progress Notes (Signed)
NEUROLOGY PROGRESS NOTE  Subjective: Currently very agitated.  Patient wants to go home.    Exam: Vitals:   04/27/19 0311 04/27/19 0813  BP: 139/69 (!) 163/66  Pulse: 72 63  Resp: 19 20  Temp: 97.6 F (36.4 C) 98.3 F (36.8 C)  SpO2: 97% 99%    Physical Exam  Constitutional: Appears well-developed and well-nourished.  Psych: Extremely agitated Eyes: No scleral injection HENT: No OP obstrucion Head: Normocephalic.  Cardiovascular: Palpable in both pedal and radial Respiratory: Effort normal, non-labored breathing GI: Soft.  No distension. There is no tenderness.  Skin: WDI   Neuro:  Mental Status: Alert, agitated, trying to take the leads off of her head.  Threatening to go home.  She is aware of the month and the year but is unaware that she is in the hospital. Cranial Nerves: II:  Visual fields grossly normal,  III,IV, VI: ptosis not present, extra-ocular motions intact bilaterally pupils equal, round, reactive to light and accommodation V,VII: Face symmetric, facial light touch sensation normal bilaterally Motor: Moving all extremities voluntarily Sensory: Intact throughout   Medications:  Scheduled: . dexamethasone (DECADRON) injection  4 mg Intravenous Q12H  . enoxaparin (LOVENOX) injection  40 mg Subcutaneous Q24H  . insulin aspart  0-15 Units Subcutaneous Q4H  . insulin detemir  12 Units Subcutaneous BID  . levothyroxine  25 mcg Intravenous Daily  . metoprolol tartrate  5 mg Intravenous Q6H  . sodium chloride flush  3 mL Intravenous Once  . sodium chloride flush  3 mL Intravenous Q12H  . sodium chloride flush  3 mL Intravenous Q12H   Continuous: . sodium chloride Stopped (04/26/19 0002)  . sodium chloride 50 mL/hr at 04/27/19 0657  . levETIRAcetam  1000 mg twice daily   FGH:WEXHBZ chloride, acetaminophen **OR** acetaminophen, LORazepam, sodium chloride flush  Pertinent Labs/Diagnostics:   EEG adult  Result Date: 04/25/2019  ABNORMALITY -  Continuous slow, generalized IMPRESSION: This study is suggestive of moderate diffuse encephalopathy, non specific to etiology. Per bedside neurologist, patient improved after IV ativan which although not specific does raise suspicion for ictal/post-ictal activity. No definite seizures or epileptiform discharges were seen throughout the recording. Lora Havens   ECHOCARDIOGRAM COMPLETE  Result Date: 04/26/2019  1. Technically difficult study. Not all wall segments visualized. EF mildly decreased, with mild hypokinesis of the basal inferoseptum, inferior, and inferolateral walls. Basal anterolateral wall not well seen. When compared to prior study, these walls were better seen on previous study and were within normal limits.  2. Left ventricular ejection fraction, by estimation, is 45 to 50%. The left ventricle has mildly decreased function.   EEG on 04/27/2019 -The study is suggestive of epileptogenicity arising from the right parietal occipital region as well as mild diffuse encephalopathy.  Nonspecific to etiology.  No definite seizures were seen throughout the recording.  EEG appears to be improved compared to previous routine EEG on 04/25/2019  Impression: 79 year old female with acute onset of acute of confusional state in the setting of brain metastasis.  Patient was on the ground for likely a very long period of time, and her clinical response to Ativan made differential strongly suggestive that she was in status epilepticus for prolonged period of time.  LTM was obtained overnight and did not show any epileptiform activity.  Patient today as noted above is very agitated, able to follow commands, does know that it is 2021 and the month.  Desiring to leave and go home.  Due to patient's agitation LTM  was removed.    Recommendations: 1.  DC LTM-ordered 2.  Continue Keppra 3.  Patient to follow-up with neurology outpatient 4.Per [facility] statutes, patients with seizures are not  allowed to drive until  they have been seizure-free for six months. Use caution when using heavy equipment or power tools. Avoid working on ladders or at heights. Take showers instead of baths. Ensure the water temperature is not too high on the home water heater. Do not go swimming alone. When caring for infants or small children, sit down when holding, feeding, or changing them to minimize risk of injury to the child in the event you have a seizure.   Also, Maintain good sleep hygiene. Avoid alcohol.   Lucely Leard PA-C [facility] [phone]  04/27/2019, 9:23 AM  I have seen the patient and reviewed the above note.  She continues to be mildly delirious, but is making progress day by day and improving.  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  I suspect that her current delirious state, however is multifactorial including steroids, poor sleep, continued effect from prolonged status epilepticus, brain metastasis with edema.  She is improving and therefore I would continue current therapy.  With EEG not revealing any subclinical seizures, I think that if radiation needs to be pursued, this could be done at this time.  Roland Rack, MD [facility] [phone]  If 7pm- 7am, please page neurology on call as listed in [place].

## 2019-04-27 NOTE — Progress Notes (Signed)
EEG electrodes removed- LTM study complete.  No skin breakdown noted.

## 2019-04-28 ENCOUNTER — Encounter: Payer: Self-pay | Admitting: Internal Medicine

## 2019-04-28 LAB — CBC WITH DIFFERENTIAL/PLATELET
Abs Immature Granulocytes: 0.14 10*3/uL — ABNORMAL HIGH (ref 0.00–0.07)
Basophils Absolute: 0 10*3/uL (ref 0.0–0.1)
Basophils Relative: 0 %
Eosinophils Absolute: 0 10*3/uL (ref 0.0–0.5)
Eosinophils Relative: 0 %
HCT: 41.4 % (ref 36.0–46.0)
Hemoglobin: 13.7 g/dL (ref 12.0–15.0)
Immature Granulocytes: 1 %
Lymphocytes Relative: 12 %
Lymphs Abs: 1.5 10*3/uL (ref 0.7–4.0)
MCH: 29.1 pg (ref 26.0–34.0)
MCHC: 33.1 g/dL (ref 30.0–36.0)
MCV: 87.9 fL (ref 80.0–100.0)
Monocytes Absolute: 0.6 10*3/uL (ref 0.1–1.0)
Monocytes Relative: 4 %
Neutro Abs: 10.8 10*3/uL — ABNORMAL HIGH (ref 1.7–7.7)
Neutrophils Relative %: 83 %
Platelets: 289 10*3/uL (ref 150–400)
RBC: 4.71 MIL/uL (ref 3.87–5.11)
RDW: 14.5 % (ref 11.5–15.5)
WBC: 13 10*3/uL — ABNORMAL HIGH (ref 4.0–10.5)
nRBC: 0 % (ref 0.0–0.2)

## 2019-04-28 LAB — BASIC METABOLIC PANEL
Anion gap: 13 (ref 5–15)
BUN: 25 mg/dL — ABNORMAL HIGH (ref 8–23)
CO2: 22 mmol/L (ref 22–32)
Calcium: 8.7 mg/dL — ABNORMAL LOW (ref 8.9–10.3)
Chloride: 107 mmol/L (ref 98–111)
Creatinine, Ser: 0.88 mg/dL (ref 0.44–1.00)
GFR calc Af Amer: >60 mL/min (ref >60)
GFR calc non Af Amer: >60 mL/min (ref >60)
Glucose, Bld: 112 mg/dL — ABNORMAL HIGH (ref 70–99)
Potassium: 3.7 mmol/L (ref 3.5–5.1)
Sodium: 142 mmol/L (ref 135–145)

## 2019-04-28 LAB — GLUCOSE, CAPILLARY
Glucose-Capillary: 86 mg/dL (ref 70–99)
Glucose-Capillary: 122 mg/dL — ABNORMAL HIGH (ref 70–99)
Glucose-Capillary: 129 mg/dL — ABNORMAL HIGH (ref 70–99)
Glucose-Capillary: 169 mg/dL — ABNORMAL HIGH (ref 70–99)
Glucose-Capillary: 337 mg/dL — ABNORMAL HIGH (ref 70–99)

## 2019-04-28 MED ORDER — ALPRAZOLAM 0.5 MG PO TABS
1.0000 mg | ORAL_TABLET | Freq: Four times a day (QID) | ORAL | Status: DC | PRN
  Administered 2019-04-28 – 2019-05-02 (×9): 1 mg via ORAL
  Filled 2019-04-28 (×9): qty 2

## 2019-04-28 NOTE — Telephone Encounter (Signed)
    Daughter calling, patient still admitted to hospital. Patient is requesting a phone call from Dr Jenny Reichmann

## 2019-04-28 NOTE — Progress Notes (Signed)
PROGRESS NOTE  Carolyn [name] EGB:[id number] DOB: 24-Nov-1940 DOA: 04/25/2019 PCP: Biagio Borg, MD  Brief History   Carolyn [name] is a 79 y.o. female with medical history significant of stage IIb non-small cell lung cancer, adenocarcinoma followed by Carolyn [name], HLD, depression, DM type 2, hypothyroidism, HTN who was found unresponsive at home.  Patient remains confused on my examination, history gathered from chart review and ER physician.  Apparently, patient was last seen by her son yesterday morning.  Then this morning, was found by her daughter laying on the floor with some bruising and skin tears.  Had left gaze preference per EMS on arrival.  She was not verbal per EMS, although this began to improve on route to the hospital.  On my examination, she is able to answer some questions, remains confused.  She thinks that she is at home, year 50.  She is grabbing onto the side of the stretcher, states that there is "a bone here."  She is able to follow some commands but not all.  ED Course: Arrived to ED as code stroke, Neurology was consulted. CT head suspicious for vasogenic edema secondary to underlying mass. Concern for seizure activity and was loaded with IV keppra and given IV ativan. Forearm laceration repaired by EDP. Patient went into A Fib RVR during ED stay and given IV lopressor with improvement in HR.   Although the patient was planned to undergo MRI today, this will not be possible as the patient has had an AICD/pacer placed on 02/21/2015. I have discussed this with Carolyn [name] [name] who has confirmed this. Carolyn [name] feels that data that may be obtained from CT head is sufficient. Radiation oncology is considering radiation therapy given this study will not be possible.  The plan is to transfer the patient to [name] for evaluation for radiation once neurological evaluation is completed and she is stable for transfer. LTM EEG is suggestive of epileptogenicity arising from the right  parieto-occipital region as well as mild diffuse encephalopathy. No definite seizures were seen throughout the recording.  I have discussed the patient with her daughter. The daughter states that she and the rest of the family are not able to provide the patient with 24 hours supervision as is recommended by PT/OT. Transitions of Care have been consulted to assist with finding SNF for the patient.   Consultants  . Oncology . Neurology  Procedures  . EEG  Antibiotics   Anti-infectives (From admission, onward)   None     Subjective  The patient is awake and alert. She is more alert, and oriented today. However she seems to be quite paranoid today. Irritable.  Objective   Vitals:  Vitals:   04/28/19 0731 04/28/19 1148  BP: 134/76 (!) 151/77  Pulse: 77 72  Resp: 20 (!) 22  Temp: 98.1 F (36.7 C) (!) 97.5 F (36.4 C)  SpO2:  97%   Exam:  Constitutional:  . The patient is awake, alert, and confused. No acute distress. Very irritable. Respiratory:  . No increased work of breathing. . No wheezes, rales, or rhonchi . No tactile fremitus Cardiovascular:  . Regular rate and rhythm . No murmurs, ectopy, or gallups. . No lateral PMI. No thrills. Abdomen:  . Abdomen is soft, non-tender, non-distended . No hernias, masses, or organomegaly . Normoactive bowel sounds.  Musculoskeletal:  . No cyanosis, clubbing, or edema Skin:  . No rashes, lesions, ulcers . palpation of skin: no induration or nodules Neurologic:  . CN  2-12 intact . Sensation all 4 extremities intact . Moving all extremities Psychiatric:  . Mental status: Confused, but upbeat  . judgment and insight appear intact: Poor  I have personally reviewed the following:   Today's Data  . Vitals, Glucoses, BMP, CBC  Imaging  . CT Brain  [name] Data  . EKG  Scheduled Meds: . dexamethasone (DECADRON) injection  4 mg Intravenous Q12H  . enoxaparin (LOVENOX) injection  40 mg Subcutaneous Q24H  .  insulin aspart  0-15 Units Subcutaneous Q4H  . insulin detemir  12 Units Subcutaneous BID  . levothyroxine  25 mcg Intravenous Daily  . metoprolol tartrate  5 mg Intravenous Q6H  . sodium chloride flush  3 mL Intravenous Q12H  . sodium chloride flush  3 mL Intravenous Q12H   Continuous Infusions: . sodium chloride Stopped (04/26/19 0002)  . sodium chloride 50 mL/hr at 04/27/19 0657  . levETIRAcetam 1,000 mg (04/28/19 1014)    Principal Problem:   Seizure (Carolyn [name]) Active Problems:   Hypothyroidism   Diabetes ([place])   Hyperlipidemia   Depression   Essential hypertension, benign   Acute encephalopathy   Adenocarcinoma of right lung, stage 2 (HCC)   LOS: 3 days   A & P  Acute encephalopathy due to seizure, underlying brain met: Likely due to apparent brain met. There is a moderate to large region of hypoattenuation within the right frontoparietal white matter. Findings are suspicious for vasogenic edema secondary to an underlying mass, possibly related to reported history of breast cancer. An acute white matter infarct cannot be excluded. CTA head and neck demonstrates patency of the right common and internal carotid arteries are patent within the neck without stenosis. The left common and internal carotid arteries are patent within the neck. No intracranial large vessel occlusion. 10 mm enhancing cortical lesion within the right frontoparietal lobe. Subtle adjacent leptomeningeal enhancement is also questioned. An additional 4 mm enhancing lesion is questioned within the left cerebellum versus asymmetric vascular enhancement. 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 CT cervical spine demonstrates no acute abnormality of the cervical spine. MRI not possible due to AICD and pacemaker. I have discussed this with Carolyn [name] and they have confirmed this. Carolyn [name] states that he feels that all the information he needs is available in the CT. Neurology and oncology have  been consulted. LTM EEG  is suggestive of epileptogenicity arising from the right parieto-occipital region as well as mild diffuse encephalopathy. No definite seizures were seen throughout the recording. Neurology has recommended continuing Keppra 1 g twice daily, follow up with [name] as an outpatient. Radiation Oncology no longer feels that transfer to [name] is necessary. They prefer to see the patient as outpatient next week.   A Fib RVR: Rate has been controlled on IV lopressor. Echocardiogram was performed on 04/26/2019. It has demonstrated a mildly decreased EF with mild hypokinesis of the basal infero septum and inferolateral walls. EF is 45-50% with mildly decreased function of the LV. Diastolic parameters are indeterminate. There is normal PA pressures. There is no evidence of mitral or aortic stenosis.  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 It was originally diagnosed in August of 2020. She is s/p SBRT right middle lobe lung mass Oct 2020. She follows with [name], last office visit 04/18/2019. She will be transferred to [name] when stable and LTM EEG is completed for evaluation for radiation therapy. She had been on observation prior to admission with plan for repeat CT  chest in 2 months to follow mass.  DM II: Sugars will be followed with FSBS and SSI. She has been started on BID levemir. Sugars have been elevated due to steroids.Glucoses over the past 24 hours have been 86 - 129.  Hypothyroidism: Continue IV synthroid.  HTN: Metoprolol, will change to IV until able to take PO med.  HLD: Lipitor, hold until able to take PO med   I have seen and examined this patient myself. I have spent 30 minutes in her evaluation and care.  DVT prophylaxis: Lovenox  Code Status: Full code  Family Communication: None at bedside Disposition Plan: Discharge to SNF as patient's family is unable to provide 24/7 supervision as is recommended by PT/OT. She  will follow up with radiation oncology next week regarding initiation of radiation therapy.  Thecla Forgione, DO [facility] Direct contact: see [email or web address]  7PM-7AM contact night coverage as above 04/28/2019, 2:03 PM  LOS: 1 day

## 2019-04-28 NOTE — Telephone Encounter (Signed)
[place] for phone visit at pt convenience, but can only be after her hospital d/c (which I think she already has been)

## 2019-04-28 NOTE — Evaluation (Signed)
Occupational Therapy Evaluation Patient Details Name: Carolyn [name] MRN: [id number] DOB: February 05, 1941 Today's Date: 04/28/2019    History of Present Illness Carolyn [name] is a 79 y.o. female with medical history significant of stage IIb non-small cell lung cancer, adenocarcinoma followed by Dr. Julien Nordmann, HLD, depression, DM type 2, hypothyroidism, HTN who was found unresponsive at home. Carolyn [name] of epileptogenicity arising from the right parieto-occipital region as well as mild diffuse encephalopathy. Carolyn [name] are suspicious for vasogenic edema secondary to an underlying mass   Clinical Impression   This 79 yo female living at home alone, doing her own basic and IADLs as well as driving presents to acute OT today at a min guard A when up on her feet ambulating and S due to safety concerns due to decreased cognition. She will benefit from acute OT with follow up [place] if she has 24 hour S/prn A otherwise she will need SNF.    Follow Up Recommendations  Home health OT;Other (comment)(only if she has 24 hour S/prn A otherwise needs SNF)    Equipment Recommendations  None recommended by OT       Precautions / Restrictions Precautions Precautions: Fall Restrictions Weight Bearing Restrictions: No      Mobility Bed Mobility Overal bed mobility: Needs Assistance Bed Mobility: Supine to Sit     Supine to sit: Supervision     General bed mobility comments: Supervision for safety, use of bed rails. Increased time.  Transfers Overall transfer level: Needs assistance Equipment used: None Transfers: Sit to/from Stand Sit to Stand: Min guard              Balance Overall balance assessment: Mild deficits observed, not formally tested                                         ADL either performed or assessed with clinical judgement   ADL Overall ADL's : Needs assistance/impaired Eating/Feeding: Independent   Grooming: Wash/dry  hands;Supervision/safety;Standing   Upper Body Bathing: Supervision/ safety;Standing   Lower Body Bathing: Supervison/ safety;Sit to/from stand   Upper Body Dressing : Supervision/safety;Sitting   Lower Body Dressing: Supervision/safety;Sit to/from stand   Toilet Transfer: Min guard A;Ambulation                   Vision Baseline Vision/History: No visual deficits              Pertinent Vitals/Pain Pain Assessment: No/denies pain     Hand Dominance Right   Extremity/Trunk Assessment Upper Extremity Assessment Upper Extremity Assessment: Overall WFL for tasks assessed           Communication Communication Communication: No difficulties   Cognition Arousal/Alertness: Awake/alert Behavior During Therapy: WFL for tasks assessed/performed Overall Cognitive Status: Impaired/Different from baseline Area of Impairment: Orientation;Safety/judgement;Problem solving                 Orientation Level: Disoriented to;Place;Time;Situation       Safety/Judgement: Decreased awareness of deficits;Decreased awareness of safety   Problem Solving: Slow processing;Requires verbal cues;Requires tactile cues General Comments: [place] able to recall she was in [facility] after re-orientation. Pt stating year was 1961, had no idea why she is here. Wiped self but then wasn't sure what to do with the toilet paper.               Home  Living Family/patient expects to be discharged to:: Skilled nursing facility(v. home health) Living Arrangements: Alone Available Help at Discharge: Family;Available PRN/intermittently Type of Home: House Home Access: Stairs to enter [employer] of Steps: 4 Entrance Stairs-Rails: Can reach both Home Layout: One level     Bathroom Shower/Tub: Tub/shower unit;Door   [employer] Toilet: Standard     Home Equipment: Shower seat;Grab bars - toilet;Grab bars - tub/shower;Walker - 2 wheels;Walker  - 4 wheels;Cane - single point   Additional Comments: Pt drives      Prior Functioning/Environment Level of Independence: Independent        Comments: Sometimes uses the 2 W RW sometimes in the house        OT Problem List: Impaired balance (sitting and/or standing);Decreased cognition;Decreased safety awareness      OT Treatment/Interventions: Self-care/ADL training;DME and/or AE instruction;Patient/family education;Balance training    OT Goals(Current goals can be found in the care plan section) Acute Rehab OT Goals Patient Stated Goal: "be able to get out of bed." OT Goal Formulation: With patient Time For Goal Achievement: 05/12/19  OT Frequency: Min 2X/week   Barriers to D/C: Decreased caregiver support          Co-evaluation PT/OT/SLP Co-Evaluation/Treatment: Yes Reason for Co-Treatment: For patient/therapist safety;To address functional/ADL transfers   OT goals addressed during session: ADL's and self-care      AM-PAC OT "6 Clicks" Daily Activity     Outcome Measure Help from another person eating meals?: None Help from another person taking care of personal grooming?: A Little Help from another person toileting, which includes using toliet, bedpan, or urinal?: A Little Help from another person bathing (including washing, rinsing, drying)?: A Little Help from another person to put on and taking off regular upper body clothing?: A Little Help from another person to put on and taking off regular lower body clothing?: A Little 6 Click Score: 19   End of Session Equipment Utilized During Treatment: Gait belt Nurse Communication: Mobility status  Activity Tolerance: Patient tolerated treatment well Patient left: in chair;with call bell/phone within reach;with chair alarm set  OT Visit Diagnosis: Unsteadiness on feet (R26.81);Other symptoms and signs involving cognitive function Symptoms and signs involving cognitive functions: (seizures)                Time:  2924-4628 OT Time Calculation (min): 24 min Charges:  OT General Charges $OT Visit: 1 Visit OT Evaluation $OT Eval Moderate Complexity: 1 Mod  Cathy , OTR/L Acute [employer] Pager [phone] Office [phone]     04/28/2019, 2:14 PM

## 2019-04-28 NOTE — Progress Notes (Signed)
Patient crying and anxious, states she would like xanax and that she takes xanax at home everyday. Paged Dr. Benny Lennert and received order for xanax.

## 2019-04-28 NOTE — Progress Notes (Addendum)
Subjective: Continues to improve  Exam: Vitals:   04/28/19 0319 04/28/19 0731  BP: 116/78 134/76  Pulse: 74 77  Resp: 18 20  Temp: 98.2 F (36.8 C) 98.1 F (36.7 C)  SpO2: 95%    Gen: In bed, NAD Resp: non-labored breathing, no acute distress Psych: She has some anxiety regarding thinking that the nursing staff is out to hurt her.  She does seem to be redirectable from this, though.  Neuro: MS: Awake, alert, oriented to month, date, year, able to spell world backwards. CN: Pupils equal round and reactive to light, extraocular movements are intact, visual fields are full, including counting in both hemifields Motor: 5/5 throughout Sensory: Intact to light touch  Pertinent Labs: Creatinine 0.8  Impression: 79 year old female with acute onset of confusional state in the setting of brain metastasis, the character of the presentation as well as her response to Ativan may be strongly suspect that she was in prolonged status epilepticus which resolved with Ativan administration.  At this point, she is continuing to improve, and I would not make any significant changes at this time.  She is going to see a neuro oncology next week in clinic.  Recommendations: 1) continue Keppra 1 g twice daily 2) follow-up with Dr. Mickeal Skinner as an outpatient 3) neurology will be available on an as-needed basis moving forward.   Roland Rack, MD [facility] [phone]  If 7pm- 7am, please page neurology on call as listed in [place].

## 2019-04-28 NOTE — Evaluation (Addendum)
Physical Therapy Evaluation Patient Details Name: Carolyn [name] MRN: [id number] DOB: 12-12-1940 Today's Date: 04/28/2019   History of Present Illness  Carolyn [name] is a 79 y.o. female with medical history significant of stage IIb non-small cell lung cancer, adenocarcinoma followed by Dr. Julien [name], HLD, depression, DM type 2, hypothyroidism, HTN who was found unresponsive at home. WEX:HBZJIRCVEL of epileptogenicity arising from the right parieto-occipital region as well as mild diffuse encephalopathy. FY:BOFBPZWC are suspicious for vasogenic edema secondary to an underlying mass  Clinical Impression  Prior to admission, pt lives alone with intermittent family support and is independent. On PT evaluation, pt with no agitation and following commands, however remains confused and oriented to self only. Moving fairly well in room, ambulating at a min guard assist level with no assistive device. Further mobility deferred as pt tachycardic to 154 bpm with minimal activity (RN notified). Due to cognitive deficits, pt will need 24/7 supervision/assist initially from family members, otherwise, is not appropriate to discharge home alone. Attempted to reach daughter/son via phone to confirm but no answer. Will continue to follow acutely to progress mobility.     Follow Up Recommendations Home health PT;Supervision/Assistance - 24 hour    Equipment Recommendations  None recommended by PT    Recommendations for Other Services       Precautions / Restrictions Precautions Precautions: Fall Restrictions Weight Bearing Restrictions: No      Mobility  Bed Mobility Overal bed mobility: Needs Assistance Bed Mobility: Supine to Sit     Supine to sit: Supervision     General bed mobility comments: Supervision for safety, use of bed rails. Increased time.  Transfers Overall transfer level: Needs assistance Equipment used: None Transfers: Sit to/from Stand Sit to Stand: Min guard             Ambulation/Gait Ambulation/Gait assistance: Min guard Gait Distance (Feet): 30 Feet Assistive device: None Gait Pattern/deviations: Step-through pattern     General Gait Details: Overall steady, min guard for safety and line management  Stairs            Wheelchair Mobility    Modified Rankin (Stroke Patients Only) Modified Rankin (Stroke Patients Only) Pre-Morbid Rankin Score: No symptoms Modified Rankin: Moderately severe disability     Balance Overall balance assessment: Mild deficits observed, not formally tested                                           Pertinent Vitals/Pain Pain Assessment: No/denies pain    Home Living Family/patient expects to be discharged to:: Skilled nursing facility Living Arrangements: Alone Available Help at Discharge: Family;Available PRN/intermittently Type of Home: House Home Access: Stairs to enter Entrance Stairs-Rails: Can reach both Entrance Stairs-Number of Steps: 4 Home Layout: One level Home Equipment: Shower seat;Grab bars - toilet;Grab bars - tub/shower;Walker - 2 wheels;Walker - 4 wheels;Cane - single point Additional Comments: Pt drives    Prior Function Level of Independence: Independent         Comments: Sometimes uses the 2 W RW sometimes in the house     Hand Dominance   Dominant Hand: Right    Extremity/Trunk Assessment   Upper Extremity Assessment Upper Extremity Assessment: Defer to OT evaluation    Lower Extremity Assessment Lower Extremity Assessment: RLE deficits/detail;LLE deficits/detail RLE Deficits / Details: Strength 5/5 LLE Deficits / Details: Strength 5/5    Cervical /  Trunk Assessment Cervical / Trunk Assessment: Normal  Communication   Communication: No difficulties  Cognition Arousal/Alertness: Awake/alert Behavior During Therapy: WFL for tasks assessed/performed Overall Cognitive Status: Impaired/Different from baseline Area of Impairment:  Orientation;Safety/judgement;Problem solving                 Orientation Level: Disoriented to;Place;Time;Situation       Safety/Judgement: Decreased awareness of deficits;Decreased awareness of safety   Problem Solving: Slow processing;Requires verbal cues;Requires tactile cues General Comments: [place]; able to recall she was in [facility] after re-orientation. Pt stating year was 1961, had no idea why she is here. Wiped self but then wasn't sure what to do with the toilet paper.       General Comments      Exercises     Assessment/Plan    PT Assessment Patient needs continued PT services  PT Problem List Decreased balance;Decreased mobility;Cardiopulmonary status limiting activity;Decreased cognition;Decreased safety awareness       PT Treatment Interventions Gait training;Stair training;Functional mobility training;Therapeutic activities;Therapeutic exercise;Balance training;Patient/family education    PT Goals (Current goals can be found in the Care Plan section)  Acute Rehab PT Goals Patient Stated Goal: "be able to get out of bed." PT Goal Formulation: With patient Time For Goal Achievement: 05/12/19 Potential to Achieve Goals: Good    Frequency Min 3X/week   Barriers to discharge        Co-evaluation PT/OT/SLP Co-Evaluation/Treatment: Yes Reason for Co-Treatment: Necessary to address cognition/behavior during functional activity PT goals addressed during session: Mobility/safety with mobility         AM-PAC PT "6 Clicks" Mobility  Outcome Measure Help needed turning from your back to your side while in a flat bed without using bedrails?: None Help needed moving from lying on your back to sitting on the side of a flat bed without using bedrails?: None Help needed moving to and from a bed to a chair (including a wheelchair)?: A Little Help needed standing up from a chair using your arms (e.g., wheelchair or bedside  chair)?: A Little Help needed to walk in hospital room?: A Little Help needed climbing 3-5 steps with a railing? : A Little 6 Click Score: 20    End of Session Equipment Utilized During Treatment: Gait belt Activity Tolerance: Patient tolerated treatment well Patient left: in chair;with call bell/phone within reach;with chair alarm set Nurse Communication: Mobility status PT Visit Diagnosis: History of falling (Z91.81)    Time: 1194-1740 PT Time Calculation (min) (ACUTE ONLY): 24 min   Charges:   PT Evaluation $PT Eval Moderate Complexity: [place], PT, DPT Acute Rehabilitation Services Pager [phone] Office [phone]   Deno Etienne 04/28/2019, 9:29 AM

## 2019-04-29 LAB — GLUCOSE, CAPILLARY
Glucose-Capillary: 129 mg/dL — ABNORMAL HIGH (ref 70–99)
Glucose-Capillary: 161 mg/dL — ABNORMAL HIGH (ref 70–99)
Glucose-Capillary: 172 mg/dL — ABNORMAL HIGH (ref 70–99)
Glucose-Capillary: 176 mg/dL — ABNORMAL HIGH (ref 70–99)
Glucose-Capillary: 207 mg/dL — ABNORMAL HIGH (ref 70–99)
Glucose-Capillary: 245 mg/dL — ABNORMAL HIGH (ref 70–99)
Glucose-Capillary: 279 mg/dL — ABNORMAL HIGH (ref 70–99)
Glucose-Capillary: 60 mg/dL — ABNORMAL LOW (ref 70–99)

## 2019-04-29 LAB — TROPONIN I (HIGH SENSITIVITY)
Troponin I (High Sensitivity): 14 ng/L (ref <18)
Troponin I (High Sensitivity): 15 ng/L (ref <18)

## 2019-04-29 MED ORDER — ALBUTEROL SULFATE (2.5 MG/3ML) 0.083% IN NEBU
2.5000 mg | INHALATION_SOLUTION | Freq: Four times a day (QID) | RESPIRATORY_TRACT | Status: DC | PRN
  Administered 2019-04-29: 2.5 mg via RESPIRATORY_TRACT
  Filled 2019-04-29: qty 3

## 2019-04-29 MED ORDER — INSULIN DETEMIR 100 UNIT/ML ~~LOC~~ SOLN
8.0000 [IU] | Freq: Two times a day (BID) | SUBCUTANEOUS | Status: DC
  Administered 2019-04-29 – 2019-05-01 (×5): 8 [IU] via SUBCUTANEOUS
  Filled 2019-04-29 (×7): qty 0.08

## 2019-04-29 NOTE — Progress Notes (Signed)
Physical Therapy Treatment Patient Details Name: JAHLISA ROSSITTO MRN: [id number] DOB: 31-Mar-1940 Today's Date: 04/29/2019    History of Present Illness BONI MACLELLAN is a 79 y.o. female with medical history significant of stage IIb non-small cell lung cancer, adenocarcinoma followed by Dr. Julien Nordmann, HLD, depression, DM type 2, hypothyroidism, HTN who was found unresponsive at home. NUU:[id number] of epileptogenicity arising from the right parieto-occipital region as well as mild diffuse encephalopathy. KV:QQVZDGLO are suspicious for vasogenic edema secondary to an underlying mass    PT Comments    On arrival to room pt agreeable to work with therapy, however by end of session pt had forgotten she was doing PT and thought therapist was her Therapist, sports. Pt is somewhat aware of her disorientation to time. She appears to have trouble picking up on social cues and can become very tangential in speech. Pt ambulated in hall with use of RW, stating she was too fearful to attempt gait w/o AD. During ambulation pt became SOB yet continued to talk though labored breathing, requiring cues to slow down and breathe. HR reaching 150s during ambulation. Patient would benefit from continued skilled PT to maximize functional independence and safety with mobility. Will continue to follow acutely.     Follow Up Recommendations  Home health PT;Supervision/Assistance - 24 hour     Equipment Recommendations  None recommended by PT    Recommendations for Other Services       Precautions / Restrictions Precautions Precautions: Fall Restrictions Weight Bearing Restrictions: No    Mobility  Bed Mobility Overal bed mobility: Needs Assistance Bed Mobility: Supine to Sit     Supine to sit: Supervision     General bed mobility comments: Supervision for safety, use of bed rails. Increased time.  Transfers Overall transfer level: Needs assistance Equipment used: None Transfers: Sit to/from Stand Sit to Stand: Min  guard         General transfer comment: min guard for safety  Ambulation/Gait Ambulation/Gait assistance: Min guard Gait Distance (Feet): 275 Feet Assistive device: Rolling walker (2 wheeled) Gait Pattern/deviations: Step-through pattern     General Gait Details: cues for RW proximity. Pt reports she is too fearful to ambulate w/o AD at this time. Reports of SOB during ambulation, though pt was able to consistantly talk while ambulating. HR reaching 150s   Stairs             Wheelchair Mobility    Modified Rankin (Stroke Patients Only) Modified Rankin (Stroke Patients Only) Pre-Morbid Rankin Score: No symptoms Modified Rankin: Moderately severe disability     Balance Overall balance assessment: Mild deficits observed, not formally tested                                          Cognition Arousal/Alertness: Awake/alert Behavior During Therapy: WFL for tasks assessed/performed Overall Cognitive Status: Impaired/Different from baseline Area of Impairment: Orientation;Safety/judgement;Problem solving                 Orientation Level: Disoriented to;Place;Time;Situation       Safety/Judgement: Decreased awareness of deficits;Decreased awareness of safety   Problem Solving: Slow processing;Requires verbal cues;Requires tactile cues General Comments: pt is aware that she is disoriented and her perception of time is off. She was very surprised when ambulating in the hallway to see how large the hospital was. She was not aware that she was in [place] or  [facility].      Exercises      General Comments        Pertinent Vitals/Pain Pain Assessment: No/denies pain    Home Living                      Prior Function            PT Goals (current goals can now be found in the care plan section) Acute Rehab PT Goals Patient Stated Goal: "be able to get out of bed." PT Goal Formulation: With patient Time For Goal  Achievement: 05/12/19 Potential to Achieve Goals: Good Progress towards PT goals: Progressing toward goals    Frequency    Min 3X/week      PT Plan Current plan remains appropriate    Co-evaluation              AM-PAC PT "6 Clicks" Mobility   Outcome Measure  Help needed turning from your back to your side while in a flat bed without using bedrails?: None Help needed moving from lying on your back to sitting on the side of a flat bed without using bedrails?: None Help needed moving to and from a bed to a chair (including a wheelchair)?: A Little Help needed standing up from a chair using your arms (e.g., wheelchair or bedside chair)?: A Little Help needed to walk in hospital room?: A Little Help needed climbing 3-5 steps with a railing? : A Little 6 Click Score: 20    End of Session Equipment Utilized During Treatment: Gait belt Activity Tolerance: Patient tolerated treatment well Patient left: in chair;with call bell/phone within reach;with chair alarm set Nurse Communication: Mobility status;Other (comment)(pt requesting inhaler) PT Visit Diagnosis: History of falling (Z91.81)     Time: 1312-1400 PT Time Calculation (min) (ACUTE ONLY): 48 min  Charges:  $Gait Training: 23-37 mins $Therapeutic Activity: 8-22 mins                     Benjiman Core, [place] Pager [phone] Acute Rehab   Allena Katz 04/29/2019, 2:10 PM

## 2019-04-29 NOTE — Progress Notes (Signed)
PROGRESS NOTE  Carolyn [name] MRN:[id number] DOB: 04/15/1940 DOA: 04/25/2019 PCP: John, James W, MD  Brief History   Carolyn [name] is a 78 y.o. female with medical history significant of stage IIb non-small cell lung cancer, adenocarcinoma followed by Dr. Mohamed, HLD, depression, DM type 2, hypothyroidism, HTN who was found unresponsive at home.  Patient remains confused on my examination, history gathered from chart review and ER physician.  Apparently, patient was last seen by her son yesterday morning.  Then this morning, was found by her daughter laying on the floor with some bruising and skin tears.  Had left gaze preference per EMS on arrival.  She was not verbal per EMS, although this began to improve on route to the hospital.  On my examination, she is able to answer some questions, remains confused.  She thinks that she is at home, year 1932.  She is grabbing onto the side of the stretcher, states that there is "a bone here."  She is able to follow some commands but not all.  ED Course: Arrived to ED as code stroke, Neurology was consulted. CT head suspicious for vasogenic edema secondary to underlying mass. Concern for seizure activity and was loaded with IV keppra and given IV ativan. Forearm laceration repaired by EDP. Patient went into A Fib RVR during ED stay and given IV lopressor with improvement in HR.   Although the patient was planned to undergo MRI today, this will not be possible as the patient has had an AICD/pacer placed on 02/21/2015. I have discussed this with [facility] who has confirmed this. Dr. Fitzpatrick feels that data that may be obtained from CT head is sufficient. Radiation oncology is considering radiation therapy given this study will not be possible.  The plan is to transfer the patient to [facility] for evaluation for radiation once neurological evaluation is completed and she is stable for transfer. LTM EEG is suggestive of epileptogenicity arising from the right  parieto-occipital region as well as mild diffuse encephalopathy. No definite seizures were seen throughout the recording.  I have discussed the patient with her daughter. The daughter states that she and the rest of the family are not able to provide the patient with 24 hours supervision as is recommended by PT/OT. Transitions of Care have been consulted to assist with finding SNF for the patient.   Consultants  . Oncology . Neurology  Procedures  . EEG  Antibiotics   Anti-infectives (From admission, onward)   None     Subjective  The patient is awake and alert. She is more alert, and oriented today. She has complained of chest pain and is very anxious about this.  Objective   Vitals:  Vitals:   04/29/19 1209 04/29/19 1504  BP: (!) 145/83 (!) 143/81  Pulse: (!) 104   Resp: 18 18  Temp: 98.6 F (37 C) 98.3 F (36.8 C)  SpO2: 97% 98%   Exam:  Constitutional:  . The patient is awake and alert. No acute distress. A little less irritable today. Respiratory:  . No increased work of breathing. . No wheezes, rales, or rhonchi . No tactile fremitus Cardiovascular:  . Regular rate and rhythm . No murmurs, ectopy, or gallups. . No lateral PMI. No thrills. Abdomen:  . Abdomen is soft, non-tender, non-distended . No hernias, masses, or organomegaly . Normoactive bowel sounds.  Musculoskeletal:  . No cyanosis, clubbing, or edema Skin:  . No rashes, lesions, ulcers . palpation of skin: no induration or   nodules Neurologic:  . CN 2-12 intact . Sensation all 4 extremities intact . Moving all extremities Psychiatric:  . Mental status: Anxious  . judgment and insight appear intact: Poor  I have personally reviewed the following:   Today's Data  . Vitals, Glucoses, BMP, CBC  Imaging  . CT Brain  Cardiology Data  . EKG  Scheduled Meds: . dexamethasone (DECADRON) injection  4 mg Intravenous Q12H  . enoxaparin (LOVENOX) injection  40 mg Subcutaneous Q24H  .  insulin aspart  0-15 Units Subcutaneous Q4H  . insulin detemir  12 Units Subcutaneous BID  . levothyroxine  25 mcg Intravenous Daily  . metoprolol tartrate  5 mg Intravenous Q6H  . sodium chloride flush  3 mL Intravenous Q12H  . sodium chloride flush  3 mL Intravenous Q12H   Continuous Infusions: . sodium chloride Stopped (04/26/19 0002)  . sodium chloride 50 mL/hr at 04/29/19 0318  . levETIRAcetam 1,000 mg (04/29/19 0815)    Principal Problem:   Seizure (HCC) Active Problems:   Hypothyroidism   Diabetes (HCC)   Hyperlipidemia   Depression   Essential hypertension, benign   Acute encephalopathy   Adenocarcinoma of right lung, stage 2 (HCC)   LOS: 4 days   A & P  Acute encephalopathy due to seizure, underlying brain met: Likely due to apparent brain met. There is a moderate to large region of hypoattenuation within the right frontoparietal white matter. Findings are suspicious for vasogenic edema secondary to an underlying mass, possibly related to reported history of breast cancer. An acute white matter infarct cannot be excluded. CTA head and neck demonstrates patency of the right common and internal carotid arteries are patent within the neck without stenosis. The left common and internal carotid arteries are patent within the neck. No intracranial large vessel occlusion. 10 mm enhancing cortical lesion within the right frontoparietal lobe. Subtle adjacent leptomeningeal enhancement is also questioned. An additional 4 mm enhancing lesion is questioned within the left cerebellum versus asymmetric vascular enhancement. 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 CT cervical spine demonstrates no acute abnormality of the cervical spine. MRI not possible due to AICD and pacemaker. I have discussed this with  Cardiology and they have confirmed this. Dr. Fitzpatrick states that he feels that all the information he needs is available in the CT. Neurology and oncology have  been consulted. LTM EEG  is suggestive of epileptogenicity arising from the right parieto-occipital region as well as mild diffuse encephalopathy. No definite seizures were seen throughout the recording. Neurology has recommended continuing Keppra 1 g twice daily, follow up with Dr. Vaslow as an outpatient. Radiation Oncology no longer feels that transfer to [facility] is necessary. They prefer to see the patient as outpatient next week.   A Fib RVR: Rate has been controlled on IV lopressor. Echocardiogram was performed on 04/26/2019. It has demonstrated a mildly decreased EF with mild hypokinesis of the basal infero septum and inferolateral walls. EF is 45-50% with mildly decreased function of the LV. Diastolic parameters are indeterminate. There is normal PA pressures. There is no evidence of mitral or aortic stenosis.  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 It was originally diagnosed in August of 2020. She is s/p SBRT right middle lobe lung mass Oct 2020. She follows with Dr. Mohamed, last office visit 04/18/2019. She will be transferred to [facility] when stable and LTM EEG is completed for evaluation for radiation therapy. She had been on observation prior to admission with plan   for repeat CT chest in 2 months to follow mass.  DM II: Sugars will be followed with FSBS and SSI. She has been started on BID levemir. Sugars have been elevated due to steroids.Glucoses over the past 24 hours have been 60-245. Will discuss with neurology how/when steroids should be tapered.  Chest Pain: Will check EKG and troponins. More than likely anxiety based.  Hypothyroidism: Continue IV synthroid.  HTN: Metoprolol, will change to IV until able to take PO med.  HLD: Lipitor, hold until able to take PO med   I have seen and examined this patient myself. I have spent 32 minutes in her evaluation and care.  DVT prophylaxis: Lovenox  Code Status: Full code  Family Communication: None  at bedside Disposition Plan: Discharge to SNF as patient's family is unable to provide 24/7 supervision as is recommended by PT/OT. She will follow up with radiation oncology next week regarding initiation of radiation therapy.   , DO [facility] Direct contact: see [email or web address]  7PM-7AM contact night coverage as above 04/29/2019, 3:37 PM  LOS: 1 day          

## 2019-04-30 LAB — GLUCOSE, CAPILLARY
Glucose-Capillary: 120 mg/dL — ABNORMAL HIGH (ref 70–99)
Glucose-Capillary: 213 mg/dL — ABNORMAL HIGH (ref 70–99)
Glucose-Capillary: 196 mg/dL — ABNORMAL HIGH (ref 70–99)
Glucose-Capillary: 203 mg/dL — ABNORMAL HIGH (ref 70–99)

## 2019-04-30 NOTE — TOC Initial Note (Signed)
Transition of Care [facility]) - Initial/Assessment Note    Patient Details  Name: Carolyn [name] MRN: [id number] Date of Birth: April 05, 1940  Transition of Care [facility]) CM/SW Contact:    Arvella Merles, LCSW Phone Number: 04/30/2019, 3:19 PM  Clinical Narrative:                 CSW received consult for possible home health services at time of discharge.  CSW spoke with patient's daughter Francis Gaines [phone] and Raynette's husband Juanda Crumble regarding PT recommendation. The family have stated they are unable to provide 24 hour supervision for the patient. CSW discussed the option of SNF placement with family. CSW expressed referrals for SNF placement can be faxed out and reported the distance patient has walked of 275 feet may exceed the qualification of a SNF. CSW discussed the insurance authorization process with family and asked what other options are being considered by the family. Patient's son-in-law Juanda Crumble stated they are unsure what the other options may be at this time. He stated patient would not agree with a nursing home. Family received another call during the conversation and asked CSW to call back.   CSW called family back and had to leave a voicemail.  Expected Discharge Plan: Skilled Nursing Facility Barriers to Discharge: Insurance Authorization   Patient Goals and CMS Choice   CMS Medicare.gov Compare Post Acute Care list provided to:: Patient Represenative (must comment) Choice offered to / list presented to : Adult Children  Expected Discharge Plan and Services Expected Discharge Plan: [place] arrangements for the past 2 months: Single Family Home                                      Prior Living Arrangements/Services Living arrangements for the past 2 months: Single Family Home Lives with:: Self Patient language and need for interpreter reviewed:: Yes        Need for Family Participation in Patient Care: Yes (Comment) Care giver  support system in place?: Yes (comment)   Criminal Activity/Legal Involvement Pertinent to Current Situation/Hospitalization: No - Comment as needed  Activities of Daily Living      Permission Sought/Granted      Share Information with NAME: Raynette  Permission granted to share info w AGENCY: SNF  Permission granted to share info w Relationship: Daughter  Permission granted to share info w Contact Information: [phone]  Emotional Assessment   Attitude/Demeanor/Rapport: Unable to Assess Affect (typically observed): Unable to Assess Orientation: : Oriented to Self, Oriented to Place, Oriented to  Time, Oriented to Situation Alcohol / Substance Use: Not Applicable Psych Involvement: No (comment)  Admission diagnosis:  Seizure ([place]) [R56.9] Trauma [T14.90XA] Brain mass [G93.89] Atrial fibrillation with RVR (HCC) [I48.91] Laceration of right forearm, initial encounter [S51.811A] Patient Active Problem List   Diagnosis Date Noted  . Seizure ([place]) 04/25/2019  . Left cervical radiculopathy 04/13/2019  . COPD (chronic obstructive pulmonary disease) ([place]) 04/13/2019  . Left shoulder pain 03/13/2019  . COPD exacerbation ([place]) 03/01/2019  . Acute respiratory failure with hypoxia ([place]) 03/01/2019  . Adenocarcinoma of right lung, stage 2 ([place]) 11/04/2018  . Goals of care, counseling/discussion 11/04/2018  . Malignant neoplasm of overlapping sites of right bronchus and lung ([place])   . Adjustment disorder with anxiety   . AMS (altered mental status) 10/23/2018  . Acute encephalopathy 10/22/2018  . Prolonged QT  interval 10/22/2018  . Chronic diastolic CHF (congestive heart failure) ([place]) 10/22/2018  . Hypoglycemia due to insulin 10/22/2018  . Mass of right lung 10/22/2018  . Hypertensive urgency 10/22/2018  . Trapezoid ligament sprain 07/21/2017  . Right lumbar radiculopathy 07/21/2017  . Spondylosis without myelopathy or radiculopathy, lumbar region 03/10/2016  . Ganglion cyst  of flexor tendon sheath of finger of left hand 09/24/2015  . Back pain 09/24/2015  . Cellulitis of toe of right foot 08/15/2013  . ICD-St.Jude 10/26/2011  . Chronic lower back pain 06/01/2011  . Dizziness 07/04/2010  . Allergic rhinitis 07/04/2010  . Preventative health care 05/30/2010  . Hormone replacement therapy (postmenopausal) 05/30/2010  . Essential hypertension, benign 02/05/2010  . JOINT EFFUSION, RIGHT KNEE 12/03/2009  . FLANK PAIN, RIGHT 06/03/2009  . INSOMNIA-SLEEP DISORDER-UNSPEC 02/12/2009  . LUMBAR RADICULOPATHY, LEFT 08/28/2008  . Hypothyroidism 07/04/2008  . CARDIOMYOPATHY 03/20/2008  . Chronic systolic dysfunction of left ventricle 12/20/2007  . ECHOCARDIOGRAM, ABNORMAL 12/20/2007  . STRESS ELECTROCARDIOGRAM, ABNORMAL 12/20/2007  . Diabetes ([place]) 12/13/2007  . Left bundle branch block 12/12/2007  . SKIN LESIONS, MULTIPLE 08/30/2007  . FATIGUE 08/30/2007  . Tobacco use disorder 06/13/2007  . Depression 12/20/2006  . GERD 12/20/2006  . BACK PAIN, LUMBAR, CHRONIC 12/20/2006  . NEPHROLITHIASIS, HX OF 12/20/2006  . THYROID NODULE 10/24/2006  . Hyperlipidemia 10/24/2006  . Anxiety state 10/24/2006  . Irritable bowel syndrome 10/24/2006  . UTI'S, RECURRENT 10/24/2006  . COLONIC POLYPS, ADENOMATOUS, HX OF 10/24/2006   PCP:  Biagio Borg, MD Pharmacy:   [id number] - [place], [place] [address] [place] [address] Phone: [phone] Fax: [phone]  [facility] - [address], [place] [address] Phone: [phone] Fax: [phone]     Social Determinants of Health (SDOH) Interventions    Readmission Risk Interventions No flowsheet data found.

## 2019-04-30 NOTE — Progress Notes (Signed)
PROGRESS NOTE  LANDEN BREELAND KVQ:[id number] DOB: 01-01-1941 DOA: 04/25/2019 PCP: Biagio Borg, MD  Brief History   Carolyn [name] is a 79 y.o. female with medical history significant of stage IIb non-small cell lung cancer, adenocarcinoma followed by Dr. Julien Nordmann, HLD, depression, DM type 2, hypothyroidism, HTN who was found unresponsive at home.  Patient remains confused on my examination, history gathered from chart review and ER physician.  Apparently, patient was last seen by her son yesterday morning.  Then this morning, was found by her daughter laying on the floor with some bruising and skin tears.  Had left gaze preference per EMS on arrival.  She was not verbal per EMS, although this began to improve on route to the hospital.  On my examination, she is able to answer some questions, remains confused.  She thinks that she is at home, year 25.  She is grabbing onto the side of the stretcher, states that there is "a bone here."  She is able to follow some commands but not all.  ED Course: Arrived to ED as code stroke, Neurology was consulted. CT head suspicious for vasogenic edema secondary to underlying mass. Concern for seizure activity and was loaded with IV keppra and given IV ativan. Forearm laceration repaired by EDP. Patient went into A Fib RVR during ED stay and given IV lopressor with improvement in HR.   Although the patient was planned to undergo MRI today, this will not be possible as the patient has had an AICD/pacer placed on 02/21/2015. I have discussed this with [facility]ology who has confirmed this. Dr. Rodney Booze feels that data that may be obtained from CT head is sufficient. Radiation oncology is considering radiation therapy given this study will not be possible.  The plan is to transfer the patient to [facility] for evaluation for radiation once neurological evaluation is completed and she is stable for transfer. LTM EEG is suggestive of epileptogenicity arising from the right  parieto-occipital region as well as mild diffuse encephalopathy. No definite seizures were seen throughout the recording.  I have discussed the patient with her daughter. The daughter states that she and the rest of the family are not able to provide the patient with 24 hours supervision as is recommended by PT/OT. Transitions of Care have been consulted to assist with finding SNF for the patient.   Consultants   Oncology  Neurology  Procedures   EEG  Antibiotics   Anti-infectives (From admission, onward)   None     Subjective  The patient is awake and alert. No new complaints.  Objective   Vitals:  Vitals:   04/30/19 0730 04/30/19 1105  BP: 124/86 (!) 144/85  Pulse: 72 82  Resp: 18 18  Temp: 97.8 F (36.6 C) 98.1 F (36.7 C)  SpO2: 99% 100%   Exam:  Constitutional:   The patient is awake and alert. No acute distress. Respiratory:   No increased work of breathing.  No wheezes, rales, or rhonchi  No tactile fremitus Cardiovascular:   Regular rate and rhythm  No murmurs, ectopy, or gallups.  No lateral PMI. No thrills. Abdomen:   Abdomen is soft, non-tender, non-distended  No hernias, masses, or organomegaly  Normoactive bowel sounds.  Musculoskeletal:   No cyanosis, clubbing, or edema Skin:   No rashes, lesions, ulcers  palpation of skin: no induration or nodules Neurologic:   CN 2-12 intact  Sensation all 4 extremities intact  Moving all extremities Psychiatric:   Mental status: Anxious  judgment and insight appear intact: Poor  I have personally reviewed the following:   Today's Data   Vitals, Glucoses, Troponins  Imaging   CT Brain  Cardiology Data   EKG  Scheduled Meds:  dexamethasone (DECADRON) injection  4 mg Intravenous Q12H   enoxaparin (LOVENOX) injection  40 mg Subcutaneous Q24H   insulin aspart  0-15 Units Subcutaneous Q4H   insulin detemir  8 Units Subcutaneous BID   levothyroxine  25 mcg  Intravenous Daily   metoprolol tartrate  5 mg Intravenous Q6H   sodium chloride flush  3 mL Intravenous Q12H   sodium chloride flush  3 mL Intravenous Q12H   Continuous Infusions:  sodium chloride Stopped (04/26/19 0002)   sodium chloride 50 mL/hr at 04/29/19 0318   levETIRAcetam 1,000 mg (04/30/19 0900)    Principal Problem:   Seizure ([place]) Active Problems:   Hypothyroidism   Diabetes ([place])   Hyperlipidemia   Depression   Essential hypertension, benign   Acute encephalopathy   Adenocarcinoma of right lung, stage 2 (HCC)   LOS: 5 days   A & P  Acute encephalopathy due to seizure, underlying brain met: Likely due to apparent brain met. There is a moderate to large region of hypoattenuation within the right frontoparietal white matter. Findings are suspicious for vasogenic edema secondary to an underlying mass, possibly related to reported history of breast cancer. An acute white matter infarct cannot be excluded. CTA head and neck demonstrates patency of the right common and internal carotid arteries are patent within the neck without stenosis. The left common and internal carotid arteries are patent within the neck. No intracranial large vessel occlusion. 10 mm enhancing cortical lesion within the right frontoparietal lobe. Subtle adjacent leptomeningeal enhancement is also questioned. An additional 4 mm enhancing lesion is questioned within the left cerebellum versus asymmetric vascular enhancement. 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 CT cervical spine demonstrates no acute abnormality of the cervical spine. MRI not possible due to AICD and pacemaker. I have discussed this with [facility] and they have confirmed this. Dr. Rodney Booze states that he feels that all the information he needs is available in the CT. Neurology and oncology have been consulted. LTM EEG  is suggestive of epileptogenicity arising from the right parieto-occipital region as well as  mild diffuse encephalopathy. No definite seizures were seen throughout the recording. Neurology has recommended continuing Keppra 1 g twice daily, follow up with Dr. Mickeal Skinner as an outpatient. Radiation Oncology no longer feels that transfer to [facility] is necessary. They prefer to see the patient as outpatient next week.   A Fib RVR: Rate has been controlled on IV lopressor. Echocardiogram was performed on 04/26/2019. It has demonstrated a mildly decreased EF with mild hypokinesis of the basal infero septum and inferolateral walls. EF is 45-50% with mildly decreased function of the LV. Diastolic parameters are indeterminate. There is normal PA pressures. There is no evidence of mitral or aortic stenosis. Rate is controlled.  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 It was originally diagnosed in August of 2020. She is s/p SBRT right middle lobe lung mass Oct 2020. She follows with Dr. Julien Nordmann, last office visit 04/18/2019. She will be transferred to [facility] when stable and LTM EEG is completed for evaluation for radiation therapy. She had been on observation prior to admission with plan for repeat CT chest in 2 months to follow mass.  DM II: Sugars will be followed with FSBS and SSI. She has  been started on BID levemir. Sugars have been elevated due to steroids.Glucoses over the past 24 hours have been 120-279. Will discuss with neurology how/when steroids should be tapered.  Chest Pain: Will check EKG and troponins. More than likely anxiety based.  Hypothyroidism: Continue IV synthroid.  HTN: Metoprolol, will change to IV until able to take PO med.  HLD: Lipitor, hold until able to take PO med   I have seen and examined this patient myself. I have spent 30 minutes in her evaluation and care.  DVT prophylaxis: Lovenox  Code Status: Full code  Family Communication: None at bedside Disposition Plan: Discharge to SNF as patient's family is unable to provide 24/7  supervision as is recommended by PT/OT. Thus there is no safe discharge at this point. She will follow up with radiation oncology next week regarding initiation of radiation therapy.  Carolyn Reimers, DO [facility] Direct contact: see [email or web address]  7PM-7AM contact night coverage as above 04/30/2019, 3:09 PM  LOS: 1 day

## 2019-04-30 NOTE — Progress Notes (Signed)
Occupational Therapy Treatment Patient Details Name: Carolyn [name] MRN: [id number] DOB: October 21, 1940 Today's Date: 04/30/2019    History of present illness Carolyn [name] is a 79 y.o. female with medical history significant of stage IIb non-small cell lung cancer, adenocarcinoma followed by Dr. Julien Nordmann, HLD, depression, DM type 2, hypothyroidism, HTN who was found unresponsive at home. ZJI:RCVELFYBOF of epileptogenicity arising from the right parieto-occipital region as well as mild diffuse encephalopathy. BP:ZWCHENID are suspicious for vasogenic edema secondary to an underlying mass   OT comments  Pt. Seen for skilled OT treatment session.  Pt. Able to complete bed mobility, grooming tasks, and toileting tasks min guard A with max cues for sequencing, and attention to task.    Follow Up Recommendations  Home health OT;Other (comment)    Equipment Recommendations  None recommended by OT    Recommendations for Other Services      Precautions / Restrictions Precautions Precautions: Fall       Mobility Bed Mobility Overal bed mobility: Needs Assistance Bed Mobility: Sit to Supine, pt. Requested back to bed after toileting and grooming tasks reporting her legs felt tired.        Sit to supine: Supervision      Transfers Overall transfer level: Needs assistance Equipment used: Rolling walker (2 wheeled) Transfers: Sit to/from [facility] Sit to Stand: Min guard Stand pivot transfers: Min guard       General transfer comment: min guard for safety, cues for RW management    Balance                                           ADL either performed or assessed with clinical judgement   ADL Overall ADL's : Needs assistance/impaired     Grooming: Wash/dry hands;Oral care;Min guard;Standing;Cueing for sequencing Grooming Details (indicate cue type and reason): max cues for attention to task and task completion                 Toilet  Transfer: Min guard;Ambulation;RW;Grab bars;Regular Museum/gallery exhibitions officer and Hygiene: Min guard;Sitting/lateral lean;Sit to/from stand       Functional mobility during ADLs: Min guard;Rolling walker General ADL Comments: pt. very talkative and self distracts with her own conversation and stories. max cues for staying on and completing tasks.     Vision       Perception     Praxis      Cognition   Behavior During Therapy: [facility] for tasks assessed/performed Overall Cognitive Status: Impaired/Different from baseline Area of Impairment: Orientation;Safety/judgement;Problem solving                 Orientation Level: Disoriented to;Place;Time;Situation       Safety/Judgement: Decreased awareness of safety   Problem Solving: Slow processing;Requires verbal cues;Requires tactile cues General Comments: more aware of her deficits this day.  listing all of the things she can not remember and things she is confused about and states "and i know this, i know i cant remember".  but then would also transition into being adamant about facts and details that were not correct.  difficulty with redirection and and sequencing of steps of adls as she stops tasks when when she begins talking. reviewed and encouraged not talking when she is actively working on something. she would agree then begin talking again after aprpox. 3 seconds.  Pt. Also spit  on the floor x2, when asked her why she would do that she reported she had food stuck in her tooth. I asked if she spit on the floor at home and she said no. I reviewed we would not spit on the floors here either at the hospital.  She verbalized understanding then while we were walking to the bed she began to discuss me to me not realizing it was me.  She was mocking me and said in a voice "no spitting on the floor" and laughed.  Did not appear to make the connection I was the one that had just discussed that with her.           Exercises     Shoulder Instructions       General Comments      Pertinent Vitals/ Pain          Home Living                                          Prior Functioning/Environment              Frequency  Min 2X/week        Progress Toward Goals  OT Goals(current goals can now be found in the care plan section)  Progress towards OT goals: Progressing toward goals     Plan      Co-evaluation                 AM-PAC OT "6 Clicks" Daily Activity     Outcome Measure   Help from another person eating meals?: None Help from another person taking care of personal grooming?: A Little Help from another person toileting, which includes using toliet, bedpan, or urinal?: A Little Help from another person bathing (including washing, rinsing, drying)?: A Little Help from another person to put on and taking off regular upper body clothing?: A Little Help from another person to put on and taking off regular lower body clothing?: A Little 6 Click Score: 19    End of Session Equipment Utilized During Treatment: Gait belt;Rolling walker  OT Visit Diagnosis: Unsteadiness on feet (R26.81);Other symptoms and signs involving cognitive function   Activity Tolerance Patient tolerated treatment well   Patient Left in bed;with bed alarm set   Nurse Communication          Time: [phone] OT Time Calculation (min): 28 min  Charges: OT General Charges $OT Visit: 1 Visit OT Treatments $Self Care/Home Management : 23-37 mins  Sonia Baller, COTA/L Acute Rehabilitation [phone]   Janice Coffin 04/30/2019, 10:29 AM

## 2019-05-01 ENCOUNTER — Telehealth: Payer: Self-pay | Admitting: *Deleted

## 2019-05-01 LAB — BASIC METABOLIC PANEL
Anion gap: 10 (ref 5–15)
BUN: 15 mg/dL (ref 8–23)
CO2: 23 mmol/L (ref 22–32)
Calcium: 8.4 mg/dL — ABNORMAL LOW (ref 8.9–10.3)
Chloride: 108 mmol/L (ref 98–111)
Creatinine, Ser: 0.75 mg/dL (ref 0.44–1.00)
GFR calc Af Amer: >60 mL/min (ref >60)
GFR calc non Af Amer: >60 mL/min (ref >60)
Glucose, Bld: 166 mg/dL — ABNORMAL HIGH (ref 70–99)
Potassium: 4 mmol/L (ref 3.5–5.1)
Sodium: 141 mmol/L (ref 135–145)

## 2019-05-01 LAB — GLUCOSE, CAPILLARY
Glucose-Capillary: 81 mg/dL (ref 70–99)
Glucose-Capillary: 135 mg/dL — ABNORMAL HIGH (ref 70–99)
Glucose-Capillary: 161 mg/dL — ABNORMAL HIGH (ref 70–99)
Glucose-Capillary: 170 mg/dL — ABNORMAL HIGH (ref 70–99)
Glucose-Capillary: 205 mg/dL — ABNORMAL HIGH (ref 70–99)
Glucose-Capillary: 126 mg/dL — ABNORMAL HIGH (ref 70–99)
Glucose-Capillary: 298 mg/dL — ABNORMAL HIGH (ref 70–99)

## 2019-05-01 LAB — CBC WITH DIFFERENTIAL/PLATELET
Abs Immature Granulocytes: 0.26 10*3/uL — ABNORMAL HIGH (ref 0.00–0.07)
Basophils Absolute: 0.1 10*3/uL (ref 0.0–0.1)
Basophils Relative: 0 %
Eosinophils Absolute: 0 10*3/uL (ref 0.0–0.5)
Eosinophils Relative: 0 %
HCT: 40 % (ref 36.0–46.0)
Hemoglobin: 13 g/dL (ref 12.0–15.0)
Immature Granulocytes: 2 %
Lymphocytes Relative: 15 %
Lymphs Abs: 1.9 10*3/uL (ref 0.7–4.0)
MCH: 28.7 pg (ref 26.0–34.0)
MCHC: 32.5 g/dL (ref 30.0–36.0)
MCV: 88.3 fL (ref 80.0–100.0)
Monocytes Absolute: 0.6 10*3/uL (ref 0.1–1.0)
Monocytes Relative: 5 %
Neutro Abs: 9.4 10*3/uL — ABNORMAL HIGH (ref 1.7–7.7)
Neutrophils Relative %: 78 %
Platelets: 237 10*3/uL (ref 150–400)
RBC: 4.53 MIL/uL (ref 3.87–5.11)
RDW: 14.6 % (ref 11.5–15.5)
WBC: 12.2 10*3/uL — ABNORMAL HIGH (ref 4.0–10.5)
nRBC: 0 % (ref 0.0–0.2)

## 2019-05-01 LAB — SARS CORONAVIRUS 2 (TAT 6-24 HRS): SARS Coronavirus 2: NEGATIVE

## 2019-05-01 MED ORDER — BUPROPION HCL ER (XL) 150 MG PO TB24
300.0000 mg | ORAL_TABLET | Freq: Every day | ORAL | Status: DC
  Administered 2019-05-01 – 2019-05-02 (×2): 300 mg via ORAL
  Filled 2019-05-01 (×2): qty 2

## 2019-05-01 MED ORDER — ATORVASTATIN CALCIUM 40 MG PO TABS
40.0000 mg | ORAL_TABLET | Freq: Every day | ORAL | Status: DC
  Administered 2019-05-01 – 2019-05-02 (×2): 40 mg via ORAL
  Filled 2019-05-01 (×2): qty 1

## 2019-05-01 MED ORDER — LISINOPRIL 10 MG PO TABS
10.0000 mg | ORAL_TABLET | Freq: Every day | ORAL | Status: DC
  Administered 2019-05-01 – 2019-05-02 (×2): 10 mg via ORAL
  Filled 2019-05-01 (×2): qty 1

## 2019-05-01 MED ORDER — TRAMADOL HCL 50 MG PO TABS: 50.0000 mg | ORAL_TABLET | Freq: Three times a day (TID) | ORAL | Status: DC | PRN

## 2019-05-01 MED ORDER — DEXAMETHASONE 4 MG PO TABS
4.0000 mg | ORAL_TABLET | Freq: Every day | ORAL | Status: DC
  Administered 2019-05-02: 12:00:00 4 mg via ORAL
  Filled 2019-05-01: qty 1

## 2019-05-01 MED ORDER — METOPROLOL TARTRATE 25 MG PO TABS
25.0000 mg | ORAL_TABLET | Freq: Two times a day (BID) | ORAL | Status: DC
  Administered 2019-05-01 – 2019-05-02 (×2): 25 mg via ORAL
  Filled 2019-05-01 (×2): qty 1

## 2019-05-01 MED ORDER — LEVOTHYROXINE SODIUM 50 MCG PO TABS
50.0000 ug | ORAL_TABLET | Freq: Every day | ORAL | Status: DC
  Administered 2019-05-02: 50 ug via ORAL
  Filled 2019-05-01: qty 1

## 2019-05-01 MED ORDER — LEVETIRACETAM 500 MG PO TABS
1000.0000 mg | ORAL_TABLET | Freq: Two times a day (BID) | ORAL | Status: DC
  Administered 2019-05-01 – 2019-05-02 (×2): 1000 mg via ORAL
  Filled 2019-05-01 (×2): qty 2

## 2019-05-01 NOTE — Telephone Encounter (Signed)
Attempted to reach patients son in law on Friday 04/28/2019, rec'd returned call from [place] on voicemail.  He states that family is very concerned and frightened as they are getting a lot of different phone calls and they need to coordinate or streamline information.    He asked that I call Raynette back.  Returned call to Raynette to schedule appt with Dr Mickeal Skinner upon her discharge.  Daughter states that she has no idea when mother(patient) will be discharged as no one has told her anything in regards to discharge date.  She asked that I call back in 3 days to attempt to schedule since she hasn't been discharged yet. Agreed.  Will follow up.

## 2019-05-01 NOTE — TOC Progression Note (Signed)
Transition of Care [facility]) - Progression Note    Patient Details  Name: ELEANNA THEILEN MRN: [id number] Date of Birth: 08-15-1940  Transition of Care [facility]) CM/SW Contact  Pollie Friar, RN Phone Number: 05/01/2019, 4:29 PM  Clinical Narrative:    CM spoke to patients daughter over the phone. They are requesting to see if she can get into SNF rehab. Daughter asked she be faxed out in the [facility] area. [place] under manual review and required information sent in.  Insurance authorization started through [place] for [employer]  TOC following.   Expected Discharge Plan: Skilled Nursing Facility Barriers to Discharge: Insurance Authorization  Expected Discharge Plan and Services Expected Discharge Plan: [place]       Living arrangements for the past 2 months: Single Family Home                                       Social Determinants of Health (SDOH) Interventions    Readmission Risk Interventions No flowsheet data found.

## 2019-05-01 NOTE — Plan of Care (Signed)
Patient resting in bed, states she still doesn't remember what happened that brought her in. Plan made for bath when patient gets done napping. Will continue to monitor.  Problem: Education: Goal: Knowledge of General Education information will improve Description: Including pain rating scale, medication(s)/side effects and non-pharmacologic comfort measures Outcome: Progressing   Problem: Health Behavior/Discharge Planning: Goal: Ability to manage health-related needs will improve Outcome: Progressing   Problem: Clinical Measurements: Goal: Ability to maintain clinical measurements within normal limits will improve Outcome: Progressing Goal: Will remain free from infection Outcome: Progressing Goal: Diagnostic test results will improve Outcome: Progressing Goal: Respiratory complications will improve Outcome: Progressing Goal: Cardiovascular complication will be avoided Outcome: Progressing   Problem: Activity: Goal: Risk for activity intolerance will decrease Outcome: Progressing   Problem: Nutrition: Goal: Adequate nutrition will be maintained Outcome: Progressing   Problem: Coping: Goal: Level of anxiety will decrease Outcome: Progressing   Problem: Elimination: Goal: Will not experience complications related to bowel motility Outcome: Progressing Goal: Will not experience complications related to urinary retention Outcome: Progressing   Problem: Pain Managment: Goal: General experience of comfort will improve Outcome: Progressing   Problem: Safety: Goal: Ability to remain free from injury will improve Outcome: Progressing   Problem: Skin Integrity: Goal: Risk for impaired skin integrity will decrease Outcome: Progressing

## 2019-05-01 NOTE — Progress Notes (Signed)
Paged Dr Karie Kirks about reviewing home medications and current medications and change to PO as appropriate. Message acknowledged.

## 2019-05-01 NOTE — Progress Notes (Signed)
PROGRESS NOTE  Carolyn [name] NLZ:[id number] DOB: March 15, 1940 DOA: 04/25/2019 PCP: Biagio Borg, MD  Brief History   Carolyn [name] is a 79 y.o. female with medical history significant of stage IIb non-small cell lung cancer, adenocarcinoma followed by Dr. Julien Nordmann, HLD, depression, DM type 2, hypothyroidism, HTN who was found unresponsive at home.  Patient remains confused on my examination, history gathered from chart review and ER physician.  Apparently, patient was last seen by her son yesterday morning.  Then this morning, was found by her daughter laying on the floor with some bruising and skin tears.  Had left gaze preference per EMS on arrival.  She was not verbal per EMS, although this began to improve on route to the hospital.  On my examination, she is able to answer some questions, remains confused.  She thinks that she is at home, year 68.  She is grabbing onto the side of the stretcher, states that there is "a bone here."  She is able to follow some commands but not all.  ED Course: Arrived to ED as code stroke, Neurology was consulted. CT head suspicious for vasogenic edema secondary to underlying mass. Concern for seizure activity and was loaded with IV keppra and given IV ativan. Forearm laceration repaired by EDP. Patient went into A Fib RVR during ED stay and given IV lopressor with improvement in HR.   Although the patient was planned to undergo MRI today, this will not be possible as the patient has had an AICD/pacer placed on 02/21/2015. I have discussed this with [facility] who has confirmed this. Dr. Rodney Booze feels that data that may be obtained from CT head is sufficient. Radiation oncology is considering radiation therapy given this study will not be possible.  The plan is to transfer the patient to [facility] for evaluation for radiation once neurological evaluation is completed and she is stable for transfer. LTM EEG is suggestive of epileptogenicity arising from the right  parieto-occipital region as well as mild diffuse encephalopathy. No definite seizures were seen throughout the recording.  I have discussed the patient with her daughter. The daughter states that she and the rest of the family are not able to provide the patient with 24 hours supervision as is recommended by PT/OT. Transitions of Care have been consulted to assist with finding SNF for the patient.   Consultants  . Oncology . Neurology  Procedures  . EEG  Antibiotics   Anti-infectives (From admission, onward)   None     Subjective  The patient is resting quietly. No new complaints.  Objective   Vitals:  Vitals:   05/01/19 0732 05/01/19 1131  BP: (!) 152/108 123/75  Pulse: 70 83  Resp: 16 14  Temp: 97.9 F (36.6 C) 98.2 F (36.8 C)  SpO2: 98% 97%   Exam:  Constitutional:  . The patient is somnolent, but rouseable. No acute distress. Respiratory:  . No increased work of breathing. . No wheezes, rales, or rhonchi . No tactile fremitus Cardiovascular:  . Regular rate and rhythm . No murmurs, ectopy, or gallups. . No lateral PMI. No thrills. Abdomen:  . Abdomen is soft, non-tender, non-distended . No hernias, masses, or organomegaly . Normoactive bowel sounds.  Musculoskeletal:  . No cyanosis, clubbing, or edema Skin:  . No rashes, lesions, ulcers . palpation of skin: no induration or nodules Neurologic:  . CN 2-12 intact . Sensation all 4 extremities intact . Moving all extremities Psychiatric:  . Mental status: Anxious  .  judgment and insight appear intact: Poor  I have personally reviewed the following:   Today's Data  . Vitals, Glucoses, BMP, CBC  Imaging  . CT Brain  Cardiology Data  . EKG  Scheduled Meds: . atorvastatin  40 mg Oral Daily  . buPROPion  300 mg Oral Daily  . dexamethasone  4 mg Oral Daily  . enoxaparin (LOVENOX) injection  40 mg Subcutaneous Q24H  . insulin aspart  0-15 Units Subcutaneous Q4H  . insulin detemir  8 Units  Subcutaneous BID  . levETIRAcetam  1,000 mg Oral BID  . levothyroxine  50 mcg Oral Q0600  . lisinopril  10 mg Oral Daily  . metoprolol tartrate  5 mg Intravenous Q6H  . sodium chloride flush  3 mL Intravenous Q12H  . sodium chloride flush  3 mL Intravenous Q12H   Continuous Infusions: . sodium chloride Stopped (04/26/19 0002)  . sodium chloride 50 mL/hr at 04/30/19 2000    Principal Problem:   Seizure [facility]) Active Problems:   Hypothyroidism   Diabetes ([place])   Hyperlipidemia   Depression   Essential hypertension, benign   Acute encephalopathy   Adenocarcinoma of right lung, stage 2 (HCC)   LOS: 6 days   A & P  Acute encephalopathy due to seizure, underlying brain met: Likely due to apparent brain met. There is a moderate to large region of hypoattenuation within the right frontoparietal white matter. Findings are suspicious for vasogenic edema secondary to an underlying mass, possibly related to reported history of breast cancer. An acute white matter infarct cannot be excluded. CTA head and neck demonstrates patency of the right common and internal carotid arteries are patent within the neck without stenosis. The left common and internal carotid arteries are patent within the neck. No intracranial large vessel occlusion. 10 mm enhancing cortical lesion within the right frontoparietal lobe. Subtle adjacent leptomeningeal enhancement is also questioned. An additional 4 mm enhancing lesion is questioned within the left cerebellum versus asymmetric vascular enhancement. 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 CT cervical spine demonstrates no acute abnormality of the cervical spine. MRI not possible due to AICD and pacemaker. I have discussed this with [facility] and they have confirmed this. Dr. Rodney Booze states that he feels that all the information he needs is available in the CT. Neurology and oncology have been consulted. LTM EEG  is suggestive of  epileptogenicity arising from the right parieto-occipital region as well as mild diffuse encephalopathy. No definite seizures were seen throughout the recording. Neurology has recommended continuing Keppra 1 g twice daily, follow up with Dr. Mickeal Skinner as an outpatient. Radiation Oncology no longer feels that transfer to [facility] is necessary. They prefer to see the patient as outpatient next week.   A Fib RVR: Rate has been controlled on IV lopressor. Echocardiogram was performed on 04/26/2019. It has demonstrated a mildly decreased EF with mild hypokinesis of the basal infero septum and inferolateral walls. EF is 45-50% with mildly decreased function of the LV. Diastolic parameters are indeterminate. There is normal PA pressures. There is no evidence of mitral or aortic stenosis. Rate is controlled.  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 It was originally diagnosed in August of 2020. She is s/p SBRT right middle lobe lung mass Oct 2020. She follows with Dr. Julien Nordmann, last office visit 04/18/2019. She will be transferred to [facility] when stable and LTM EEG is completed for evaluation for radiation therapy. She had been on observation prior to admission with  plan for repeat CT chest in 2 months to follow mass.  DM II: Sugars will be followed with FSBS and SSI. She has been started on BID levemir. Sugars have been elevated due to steroids.Glucoses over the past 24 hours have been 161-205. Will discuss with neurology how/when steroids should be tapered.  Chest Pain: Will check EKG and troponins. More than likely anxiety based.  Hypothyroidism: Continue IV synthroid.  HTN: Metoprolol. Changed to oral.  HLD: Lipitor, restarted.  I have seen and examined this patient myself. I have spent 32 minutes in her evaluation and care.  DVT prophylaxis: Lovenox  Code Status: Full code  Family Communication: None at bedside Disposition Plan: Discharge to SNF as patient's family is  unable to provide 24/7 supervision as is recommended by PT/OT. Thus there is no safe discharge at this point. She will follow up with radiation oncology next week regarding initiation of radiation therapy. TOC consulted to assist with placement.  Zerrick Hanssen, DO [facility] Direct contact: see [email or web address]  7PM-7AM contact night coverage as above 04/30/2019, 3:09 PM  LOS: 1 day

## 2019-05-01 NOTE — Progress Notes (Signed)
Inpatient Rehab Admissions Coordinator:   Note therapy recommendations are for home health.  Pt currently mobilizing too well for CIR.  Will sign off at this time.   Shann Medal, PT, DPT Admissions Coordinator [phone] 05/01/19  9:48 AM

## 2019-05-01 NOTE — Telephone Encounter (Signed)
Called and spoke to Carolyn [name]. She stated that pt does not believe that she is getting the correct medication because she is not getting them in pill form.   I assured Carolyn [name] that they are giving pt meds via IV. I went through the list and explained what they were for. Informed also that Dr. Jenny Reichmann is aware of all of her medications, imaging and treatments.

## 2019-05-01 NOTE — NC FL2 (Addendum)
[place] MEDICAID FL2 LEVEL OF CARE SCREENING TOOL     IDENTIFICATION  Patient Name: Carolyn [name] Birthdate: [birth year] Sex: female Admission Date (Current Location): 04/25/2019  [facility] and [place] Number:  Herbalist and Address:  [facility] [employer], [place] [address], [place], [place] [address]      Provider Number: [id number]  Attending Physician Name and Address:  Karie Kirks, DO  Relative Name and Phone Number:  Juanda Crumble [phone]    Current Level of Care: Hospital Recommended Level of Care: [place] Prior Approval Number:    Date Approved/Denied:   PASRR Number: under review  Discharge Plan: SNF    Current Diagnoses: Patient Active Problem List   Diagnosis Date Noted  . Seizure ([place]) 04/25/2019  . Left cervical radiculopathy 04/13/2019  . COPD (chronic obstructive pulmonary disease) ([place]) 04/13/2019  . Left shoulder pain 03/13/2019  . COPD exacerbation ([place]) 03/01/2019  . Acute respiratory failure with hypoxia ([place]) 03/01/2019  . Adenocarcinoma of right lung, stage 2 ([place]) 11/04/2018  . Goals of care, counseling/discussion 11/04/2018  . Malignant neoplasm of overlapping sites of right bronchus and lung ([place])   . Adjustment disorder with anxiety   . AMS (altered mental status) 10/23/2018  . Acute encephalopathy 10/22/2018  . Prolonged QT interval 10/22/2018  . Chronic diastolic CHF (congestive heart failure) ([place]) 10/22/2018  . Hypoglycemia due to insulin 10/22/2018  . Mass of right lung 10/22/2018  . Hypertensive urgency 10/22/2018  . Trapezoid ligament sprain 07/21/2017  . Right lumbar radiculopathy 07/21/2017  . Spondylosis without myelopathy or radiculopathy, lumbar region 03/10/2016  . Ganglion cyst of flexor tendon sheath of finger of left hand 09/24/2015  . Back pain 09/24/2015  . Cellulitis of toe of right foot 08/15/2013  . ICD-St.Jude 10/26/2011  . Chronic lower back pain 06/01/2011  . Dizziness  07/04/2010  . Allergic rhinitis 07/04/2010  . Preventative health care 05/30/2010  . Hormone replacement therapy (postmenopausal) 05/30/2010  . Essential hypertension, benign 02/05/2010  . JOINT EFFUSION, RIGHT KNEE 12/03/2009  . FLANK PAIN, RIGHT 06/03/2009  . INSOMNIA-SLEEP DISORDER-UNSPEC 02/12/2009  . LUMBAR RADICULOPATHY, LEFT 08/28/2008  . Hypothyroidism 07/04/2008  . CARDIOMYOPATHY 03/20/2008  . Chronic systolic dysfunction of left ventricle 12/20/2007  . ECHOCARDIOGRAM, ABNORMAL 12/20/2007  . STRESS ELECTROCARDIOGRAM, ABNORMAL 12/20/2007  . Diabetes ([place]) 12/13/2007  . Left bundle branch block 12/12/2007  . SKIN LESIONS, MULTIPLE 08/30/2007  . FATIGUE 08/30/2007  . Tobacco use disorder 06/13/2007  . Depression 12/20/2006  . GERD 12/20/2006  . BACK PAIN, LUMBAR, CHRONIC 12/20/2006  . NEPHROLITHIASIS, HX OF 12/20/2006  . THYROID NODULE 10/24/2006  . Hyperlipidemia 10/24/2006  . Anxiety state 10/24/2006  . Irritable bowel syndrome 10/24/2006  . UTI'S, RECURRENT 10/24/2006  . COLONIC POLYPS, ADENOMATOUS, HX OF 10/24/2006    Orientation RESPIRATION BLADDER Height & Weight     Self, Time, Situation, Place(does have more confusion in evenings)  Normal Continent Weight: 75.4 kg Height:     BEHAVIORAL SYMPTOMS/MOOD NEUROLOGICAL BOWEL NUTRITION STATUS    Convulsions/Seizures(keppra 1000mg  BID) Continent Diet(carb modified)  AMBULATORY STATUS COMMUNICATION OF NEEDS Skin   Limited Assist Verbally Normal                       Personal Care Assistance Level of Assistance  Bathing, Feeding, Dressing Bathing Assistance: Limited assistance Feeding assistance: Independent Dressing Assistance: Limited assistance     Functional Limitations Info  Sight, Hearing, Speech Sight Info: Impaired Hearing Info: Adequate  Speech Info: Adequate    SPECIAL CARE FACTORS FREQUENCY  PT (By licensed PT), OT (By licensed OT)     PT Frequency: 5x/wk OT Frequency: 5x/wk             Contractures Contractures Info: Not present    Additional Factors Info  Code Status, Allergies, Psychotropic, Insulin Sliding Scale Code Status Info: DNR Allergies Info: liquid bandaid/ depacon/ dilaudid/ divalproex sodium/ zinc acetate/ cephalexin/ hydromorphone/ levofloxacin/ metformin/ simvastatin/ sulfa drugs/ sulfonamide derivatives/ valproic acid Psychotropic Info: Wellbutrin SL 300 mg PO daily Insulin Sliding Scale Info: Novolog 0-15 units SQ every 4 hours// Levemir 8 units SQ BID       Current Medications (05/01/2019):  This is the current hospital active medication list Current Facility-Administered Medications  Medication Dose Route Frequency Provider Last Rate Last Admin  . 0.9 %  sodium chloride infusion  250 mL Intravenous PRN Dessa Phi, DO   Stopped at 04/26/19 0002  . 0.9 %  sodium chloride infusion   Intravenous Continuous Swayze, Ava, DO 50 mL/hr at 04/30/19 2000 Rate Verify at 04/30/19 2000  . acetaminophen (TYLENOL) tablet 650 mg  650 mg Oral Q6H PRN Dessa Phi, DO   650 mg at 04/30/19 0548   Or  . acetaminophen (TYLENOL) suppository 650 mg  650 mg Rectal Q6H PRN Dessa Phi, DO      . albuterol (PROVENTIL) (2.5 MG/3ML) 0.083% nebulizer solution 2.5 mg  2.5 mg Inhalation Q6H PRN Swayze, Ava, DO   2.5 mg at 04/29/19 1546  . ALPRAZolam (XANAX) tablet 1 mg  1 mg Oral QID PRN Swayze, Ava, DO   1 mg at 05/01/19 0908  . atorvastatin (LIPITOR) tablet 40 mg  40 mg Oral Daily Swayze, Ava, DO      . buPROPion (WELLBUTRIN XL) 24 hr tablet 300 mg  300 mg Oral Daily Swayze, Ava, DO      . dexamethasone (DECADRON) tablet 4 mg  4 mg Oral Daily Swayze, Ava, DO      . enoxaparin (LOVENOX) injection 40 mg  40 mg Subcutaneous Q24H Dessa Phi, DO   40 mg at 04/30/19 1832  . insulin aspart (novoLOG) injection 0-15 Units  0-15 Units Subcutaneous Q4H Dessa Phi, DO   5 Units at 05/01/19 1231  . insulin detemir (LEVEMIR) injection 8 Units  8 Units Subcutaneous BID  Swayze, Ava, DO   8 Units at 05/01/19 0906  . levETIRAcetam (KEPPRA) tablet 1,000 mg  1,000 mg Oral BID Swayze, Ava, DO      . levothyroxine (SYNTHROID) tablet 50 mcg  50 mcg Oral Q0600 Swayze, Ava, DO      . lisinopril (ZESTRIL) tablet 10 mg  10 mg Oral Daily Swayze, Ava, DO      . metoprolol tartrate (LOPRESSOR) tablet 25 mg  25 mg Oral BID Swayze, Ava, DO      . sodium chloride flush (NS) 0.9 % injection 3 mL  3 mL Intravenous Q12H Dessa Phi, DO   3 mL at 05/01/19 0910  . sodium chloride flush (NS) 0.9 % injection 3 mL  3 mL Intravenous Q12H Dessa Phi, DO   3 mL at 05/01/19 0908  . sodium chloride flush (NS) 0.9 % injection 3 mL  3 mL Intravenous PRN Dessa Phi, DO      . traMADol Veatrice Bourbon) tablet 50 mg  50 mg Oral TID PRN Swayze, Ava, DO         Discharge Medications: Please see discharge summary for a list of discharge medications.  Relevant Imaging Results:  Relevant Lab Results:   Additional Information ss#[id number]  Pollie Friar, RN

## 2019-05-01 NOTE — Care Management (Signed)
Carolyn [name] 08/17/1940 05/01/2019  To Whom It May Concern:   Please be advised that the above-named patient will require a short-term nursing home stay--anticipated 30 days or less for rehabilitation and strengthening. The plan is for return home.

## 2019-05-01 NOTE — Progress Notes (Signed)
Physical Therapy Treatment Patient Details Name: Carolyn [name] MRN: [id number] DOB: Apr 12, 1940 Today's Date: 05/01/2019    History of Present Illness Carolyn [name] is a 79 y.o. female with medical history significant of stage IIb non-small cell lung cancer, adenocarcinoma followed by Dr. Julien Nordmann, HLD, depression, DM type 2, hypothyroidism, HTN who was found unresponsive at home. [id number] of epileptogenicity arising from the right parieto-occipital region as well as mild diffuse encephalopathy. [id number] are suspicious for vasogenic edema secondary to an underlying mass    PT Comments    Pt progressing towards physical therapy goals. Pt pleasant and agreeable to PT, however distracted easily and with excessive, tangential speech throughout session. Pt requested to complete ADL's prior to walking in the hall and required 75% cues to complete combing hair, brushing teeth, and toileting due to decreased attention to task and increased talking. At this time, feel pt will need 24 hour supervision which family has stated they are unable to provide. Due to decreased awareness of safety/deficits as well as decreased attention and short term memory, I would be concerned with the pt attempting to manage IADL's such as cooking and medication management. Discharge recommendations updated to reflect this. Will continue to follow and progress as able per POC.     Follow Up Recommendations  SNF;Supervision/Assistance - 24 hour     Equipment Recommendations  None recommended by PT    Recommendations for Other Services       Precautions / Restrictions Precautions Precautions: Fall Restrictions Weight Bearing Restrictions: No    Mobility  Bed Mobility Overal bed mobility: Needs Assistance Bed Mobility: Sit to Supine;Supine to Sit     Supine to sit: Supervision Sit to supine: Supervision   General bed mobility comments: Supervision for safety, use of bed rails. Increased  time.  Transfers Overall transfer level: Needs assistance Equipment used: Rolling walker (2 wheeled) Transfers: Sit to/from [facility] Sit to Stand: Min guard Stand pivot transfers: Min guard       General transfer comment: min guard for safety, cues for RW management  Ambulation/Gait Ambulation/Gait assistance: Min guard Gait Distance (Feet): 200 Feet Assistive device: Rolling walker (2 wheeled) Gait Pattern/deviations: Step-through pattern Gait velocity: Decreased Gait velocity interpretation: <1.31 ft/sec, indicative of household ambulator General Gait Details: VC's for increased step/stride length, improved posture and forward gaze. Pt talking constantly throughout gait training and noted she appeared SOB at times. Cues to stop talking and focus on her breathing resulted in no corrective changes.    Stairs             Wheelchair Mobility    Modified Rankin (Stroke Patients Only) Modified Rankin (Stroke Patients Only) Pre-Morbid Rankin Score: No symptoms Modified Rankin: Moderately severe disability     Balance Overall balance assessment: Mild deficits observed, not formally tested                                          Cognition Arousal/Alertness: Awake/alert Behavior During Therapy: WFL for tasks assessed/performed Overall Cognitive Status: Impaired/Different from baseline Area of Impairment: Orientation;Safety/judgement;Problem solving                 Orientation Level: Disoriented to;Time;Situation       Safety/Judgement: Decreased awareness of safety   Problem Solving: Slow processing;Requires verbal cues;Requires tactile cues        Exercises      General  Comments        Pertinent Vitals/Pain Pain Assessment: No/denies pain    Home Living                      Prior Function            PT Goals (current goals can now be found in the care plan section) Acute Rehab PT Goals Patient  Stated Goal: None stated PT Goal Formulation: With patient Time For Goal Achievement: 05/12/19 Potential to Achieve Goals: Good Progress towards PT goals: Progressing toward goals    Frequency    Min 2X/week      PT Plan Discharge plan needs to be updated;Frequency needs to be updated    Co-evaluation              AM-PAC PT "6 Clicks" Mobility   Outcome Measure  Help needed turning from your back to your side while in a flat bed without using bedrails?: None Help needed moving from lying on your back to sitting on the side of a flat bed without using bedrails?: None Help needed moving to and from a bed to a chair (including a wheelchair)?: A Little Help needed standing up from a chair using your arms (e.g., wheelchair or bedside chair)?: A Little Help needed to walk in hospital room?: A Little Help needed climbing 3-5 steps with a railing? : A Little 6 Click Score: 20    End of Session Equipment Utilized During Treatment: Gait belt Activity Tolerance: Patient tolerated treatment well Patient left: in bed;with call bell/phone within reach;with bed alarm set Nurse Communication: Mobility status PT Visit Diagnosis: History of falling (Z91.81);Other symptoms and signs involving the nervous system (R29.898)     Time: 1415-1450 PT Time Calculation (min) (ACUTE ONLY): 35 min  Charges:  $Gait Training: 23-37 mins                     [name], PT, DPT Acute Rehabilitation Services Pager: [phone] Office: [phone]    [name] 05/01/2019, 3:04 PM

## 2019-05-02 ENCOUNTER — Encounter: Payer: Self-pay | Admitting: Internal Medicine

## 2019-05-02 ENCOUNTER — Ambulatory Visit: Payer: Medicare Other | Admitting: Internal Medicine

## 2019-05-02 DIAGNOSIS — E119 Type 2 diabetes mellitus without complications: Secondary | ICD-10-CM

## 2019-05-02 LAB — CREATININE, SERUM
Creatinine, Ser: 0.88 mg/dL (ref 0.44–1.00)
GFR calc Af Amer: >60 mL/min (ref >60)
GFR calc non Af Amer: >60 mL/min (ref >60)

## 2019-05-02 LAB — GLUCOSE, CAPILLARY
Glucose-Capillary: 231 mg/dL — ABNORMAL HIGH (ref 70–99)
Glucose-Capillary: 55 mg/dL — ABNORMAL LOW (ref 70–99)
Glucose-Capillary: 96 mg/dL (ref 70–99)
Glucose-Capillary: 247 mg/dL — ABNORMAL HIGH (ref 70–99)
Glucose-Capillary: 209 mg/dL — ABNORMAL HIGH (ref 70–99)

## 2019-05-02 MED ORDER — ALPRAZOLAM 1 MG PO TABS: 1.0000 mg | ORAL_TABLET | Freq: Four times a day (QID) | ORAL | 0 refills | Status: DC | PRN

## 2019-05-02 MED ORDER — TRAMADOL HCL 50 MG PO TABS: 50.0000 mg | ORAL_TABLET | Freq: Three times a day (TID) | ORAL | 0 refills | Status: DC | PRN

## 2019-05-02 MED ORDER — INSULIN DETEMIR 100 UNIT/ML ~~LOC~~ SOLN: 5.0000 [IU] | Freq: Every day | SUBCUTANEOUS | Status: DC

## 2019-05-02 MED ORDER — LANTUS SOLOSTAR 100 UNIT/ML ~~LOC~~ SOPN: 5.0000 [IU] | PEN_INJECTOR | Freq: Every day | SUBCUTANEOUS | 3 refills | Status: DC

## 2019-05-02 MED ORDER — GABAPENTIN 300 MG PO CAPS: 300.0000 mg | ORAL_CAPSULE | Freq: Three times a day (TID) | ORAL | 3 refills | Status: AC

## 2019-05-02 MED ORDER — LEVETIRACETAM 1000 MG PO TABS: 1000.0000 mg | ORAL_TABLET | Freq: Two times a day (BID) | ORAL | 0 refills | Status: AC

## 2019-05-02 MED ORDER — INSULIN DETEMIR 100 UNIT/ML ~~LOC~~ SOLN
5.0000 [IU] | Freq: Two times a day (BID) | SUBCUTANEOUS | Status: DC
  Administered 2019-05-02: 13:00:00 5 [IU] via SUBCUTANEOUS
  Filled 2019-05-02 (×2): qty 0.05

## 2019-05-02 MED ORDER — DEXAMETHASONE 4 MG PO TABS: 4.0000 mg | ORAL_TABLET | Freq: Every day | ORAL | 0 refills | Status: DC

## 2019-05-02 NOTE — Discharge Summary (Signed)
Physician Discharge Summary  Carolyn [name] DOB: [birth year] DOA: 04/25/2019  PCP: Biagio Borg, MD  Admit date: 04/25/2019 Discharge date: 05/02/2019  Recommendations for Outpatient Follow-up:  1. Follow up with radiation oncology as outpatient as directed. 2. Follow up with PCP in 7-10 days. 3. Check glucoses daily. Report glucoses over 300 or below 70 to facility physician. 4. Discharge to SNF with PT/OT. 5. Follow up with Dr. Mickeal Skinner in one week. Call to make appointment.  Contact information for after-discharge care    Destination    [facility] Preferred SNF .   Service: Skilled Nursing Contact information: [address] [place] [phone]             Discharge Diagnoses: Principal diagnosis is #1 1. 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 Acute encephalopathy secondary to seizures 4. Atrial fibrillation with RVR. No AC due to brain mets 5. DM II 6. Non-cardiac chest pain. Anxiety related 7. Hypothyroidism 8. Hypertension 9. Hyperlipidemia 10. Debility  Discharge Condition: Fair  Disposition: SNF for rehab  Diet recommendation: Heart healthy with modified carbohydratesj  Filed Weights   04/25/19 2230  Weight: 75.4 kg    History of present illness:  Carolyn [name] is a 79 y.o. female with medical history significant of stage IIb non-small cell lung cancer, adenocarcinoma followed by Dr. Julien Nordmann, HLD, depression, DM type 2, hypothyroidism, HTN who was found unresponsive at home.  Patient remains confused on my examination, history gathered from chart review and ER physician.  Apparently, patient was last seen by her son yesterday morning.  Then this morning, was found by her daughter laying on the floor with some bruising and skin tears.  Had left gaze preference per EMS on arrival.  She was not verbal per EMS, although this began to improve  on route to the hospital.  On my examination, she is able to answer some questions, remains confused.  She thinks that she is at home, year 15.  She is grabbing onto the side of the stretcher, states that there is "a bone here."  She is able to follow some commands but not all.  ED Course: Arrived to ED as code stroke, Neurology was consulted. CT head suspicious for vasogenic edema secondary to underlying mass. Concern for seizure activity and was loaded with IV keppra and given IV ativan. Forearm laceration repaired by EDP. Patient went into A Fib RVR during ED stay and given IV lopressor with improvement in HR.   Hospital Course:  [name] a 79 y.o.femalewith medical history significant ofstage IIb non-small cell lung cancer, adenocarcinoma followed by Dr. Julien Nordmann, HLD, depression, DM type 2, hypothyroidism, HTN who was found unresponsive at home.Patient remainsconfused on my examination, history gathered from chart review and ER physician. Apparently, patient was last seen by her son yesterday morning. Then this morning, was found by her daughter laying on the floor with some bruising and skin tears. Had left gaze preference per EMS on arrival. She was not verbal per EMS, although this began to improve on route to the hospital.  On my examination, she is able to answer some questions, remains confused. She thinks that she is at home, year 101. She is grabbing onto the side of the stretcher, states that there is "a bone here." She is able to follow some commands but not all.  ED Course:Arrived to ED as code stroke, Neurology was  consulted. CT head suspicious for vasogenic edema secondary to underlying mass. Concern for seizure activity and was loaded with IV keppra and given IV ativan. Forearm laceration repaired by EDP. Patient went into A Fib RVR during ED stay and given IV lopressor with improvement in HR.  Although the patient was planned to undergo MRI today, this  will not be possible as the patient has had an AICD/pacer placed on 02/21/2015. I have discussed this with [place] cardiology who has confirmed this. Dr. Rodney Booze feels that data that may be obtained from CT head is sufficient. Radiation oncology is considering radiation therapy given this study will not be possible.  The plan is to transfer the patient to [facility] for evaluation for radiation once neurological evaluation is completed and she is stable for transfer. LTM EEG is suggestive of epileptogenicity arising from the right parieto-occipital region as well as mild diffuse encephalopathy. No definite seizures were seen throughout the recording.  I have discussed the patient with her daughter. The daughter states that she and the rest of the family are not able to provide the patient with 24 hours supervision as is recommended by PT/OT.   She will discharge to SNF today and will follow up with Dr. Mickeal Skinner as outpatient. Today's assessment: S: The patient is resting comfortably. No new complaints. O: Vitals:  Vitals:   05/02/19 0448 05/02/19 0932  BP: 111/72 117/68  Pulse: 88 90  Resp: 18 16  Temp: 97.9 F (36.6 C) 98 F (36.7 C)  SpO2: 99% 97%   Exam:  Constitutional:   The patient is awake, alert, and oriented x 3. No acute distress. Respiratory:   No increased work of breathing.  No wheezes, rales, or rhonchi  No tactile fremitus Cardiovascular:   Regular rate and rhythm  No murmurs, ectopy, or gallups.  No lateral PMI. No thrills. Abdomen:   Abdomen is soft, non-tender, non-distended  No hernias, masses, or organomegaly  Normoactive bowel sounds.  Musculoskeletal:   No cyanosis, clubbing, or edema Skin:   No rashes, lesions, ulcers  palpation of skin: no induration or nodules Neurologic:   CN 2-12 intact  Sensation all 4 extremities intact  Moving all extremities Psychiatric:   Mental status: Anxious   judgment and insight appear intact:  Poor   Discharge Instructions  Discharge Instructions    Activity as tolerated - No restrictions   Complete by: As directed    Seizure precautions   Call MD for:   Complete by: As directed    Seizures, neurological deficits   Call MD for:  difficulty breathing, headache or visual disturbances   Complete by: As directed    Call MD for:  persistant nausea and vomiting   Complete by: As directed    Diet - low sodium heart healthy   Complete by: As directed    Diet Carb Modified   Complete by: As directed    Discharge instructions   Complete by: As directed    Follow up with radiation oncology as outpatient as directed. Follow up with PCP in 7-10 days. Check glucoses daily. Report glucoses over 300 or below 70 to facility physician. Discharge to SNF with PT/OT. Follow up with Dr. Mickeal Skinner in one week. Call to make appointment. No driving until cleared by Dr. Mickeal Skinner.   Increase activity slowly   Complete by: As directed      Allergies as of 05/02/2019      Reactions   Band-aid Liquid Bandage [dermatological Products, Misc.] Hives, Itching  Depacon [valproic Acid] Other (See Comments)   JAUNDICE   Dilaudid [hydromorphone Hcl] Other (See Comments)   Jaundice   Divalproex Sodium Nausea And Vomiting, Other (See Comments)   Jaundice   Zinc Acetate Hives, Itching   Cephalexin Nausea And Vomiting   Hydromorphone Hcl Rash   Levofloxacin Nausea And Vomiting   Metformin Nausea And Vomiting   Simvastatin Other (See Comments)   Muscle soreness   Sulfa Antibiotics Nausea And Vomiting   Sulfonamide Derivatives Nausea And Vomiting   Valproic Acid Other (See Comments)   JAUNDICE      Medication List    STOP taking these medications   furosemide 20 MG tablet Commonly known as: LASIX   HYDROcodone-acetaminophen 7.5-325 MG tablet Commonly known as: NORCO   oxyCODONE-acetaminophen 10-325 MG tablet Commonly known as: Percocet   pioglitazone 30 MG tablet Commonly known as:  Actos   predniSONE 5 MG (48) Tbpk tablet Commonly known as: STERAPRED UNI-PAK 48 TAB   tiZANidine 2 MG tablet Commonly known as: ZANAFLEX     TAKE these medications   albuterol 108 (90 Base) MCG/ACT inhaler Commonly known as: VENTOLIN HFA Inhale 2 puffs into the lungs every 6 (six) hours as needed for wheezing or shortness of breath.   ALPRAZolam 1 MG tablet Commonly known as: XANAX Take 1 tablet (1 mg total) by mouth 4 (four) times daily as needed for anxiety. What changed: See the new instructions.   aspirin 81 MG tablet Take 81 mg by mouth daily.   atorvastatin 40 MG tablet Commonly known as: LIPITOR TAKE 1 TABLET BY MOUTH EVERY DAY   blood glucose meter kit and supplies Kit Use to test blood sugar up to three times a day. DX E11.09   buPROPion 300 MG 24 hr tablet Commonly known as: WELLBUTRIN XL TAKE 1 TABLET BY MOUTH EVERY DAY   desoximetasone 0.25 % cream Commonly known as: TOPICORT APPLY TO AFFECTED AREA TWICE A DAY What changed: See the new instructions.   dexamethasone 4 MG tablet Commonly known as: DECADRON Take 1 tablet (4 mg total) by mouth daily. Start taking on: May 03, 2019   gabapentin 300 MG capsule Commonly known as: NEURONTIN Take 1 capsule (300 mg total) by mouth 3 (three) times daily.   glucose blood test strip Commonly known as: Cool Blood Glucose Test Strips Use to test blood sugar up to three times a day. DX E11.09   Insulin Pen Needle 32G X 4 MM Misc Commonly known as: BD Pen Needle Nano U/F USE TO ADMINISTER INSULIN ONCE A DAY. DX E11.9   Januvia 100 MG tablet Generic drug: sitaGLIPtin TAKE 1 TABLET BY MOUTH EVERY DAY What changed: how much to take   Lancets Misc Use lancets to test blood sugar up to three times a day. DX E11.09   Lantus SoloStar 100 UNIT/ML Solostar Pen Generic drug: Insulin Glargine Inject 5 Units into the skin daily. What changed: See the new instructions.   levETIRAcetam 1000 MG tablet Commonly  known as: KEPPRA Take 1 tablet (1,000 mg total) by mouth 2 (two) times daily.   levothyroxine 50 MCG tablet Commonly known as: SYNTHROID TAKE 1 TABLET BY MOUTH EVERY DAY   lisinopril 10 MG tablet Commonly known as: ZESTRIL TAKE 2 TABLETS EVERY DAY   metoprolol tartrate 25 MG tablet Commonly known as: LOPRESSOR TAKE 1 TABLET BY MOUTH TWICE A DAY   omeprazole 20 MG capsule Commonly known as: PRILOSEC TAKE 1 CAPSULE BY MOUTH TWICE A DAY  traMADol 50 MG tablet Commonly known as: ULTRAM Take 1 tablet (50 mg total) by mouth 3 (three) times daily as needed for moderate pain.   Vitamin D (Ergocalciferol) 1.25 MG (50000 UNIT) Caps capsule Commonly known as: DRISDOL Take 1 capsule (50,000 Units total) by mouth every 7 (seven) days.      Allergies  Allergen Reactions  . Band-Aid Liquid Bandage [Dermatological Products, Misc.] Hives and Itching  . Depacon [Valproic Acid] Other (See Comments)    JAUNDICE  . Dilaudid [Hydromorphone Hcl] Other (See Comments)    Jaundice  . Divalproex Sodium Nausea And Vomiting and Other (See Comments)    Jaundice  . Zinc Acetate Hives and Itching  . Cephalexin Nausea And Vomiting  . Hydromorphone Hcl Rash  . Levofloxacin Nausea And Vomiting  . Metformin Nausea And Vomiting  . Simvastatin Other (See Comments)    Muscle soreness   . Sulfa Antibiotics Nausea And Vomiting  . Sulfonamide Derivatives Nausea And Vomiting  . Valproic Acid Other (See Comments)    JAUNDICE      The results of significant diagnostics from this hospitalization (including imaging, microbiology, ancillary and laboratory) are listed below for reference.    Significant Diagnostic Studies: CT Code Stroke CTA Head W/WO contrast  Addendum Date: 04/30/2019   ADDENDUM REPORT: 04/30/2019 17:40 ADDENDUM: While reviewing this case for an upcoming tumor board conference, a focus of asymmetric enhancement is noted within the paramedian right parietooccipital lobe measuring 6 mm  (series 12, image 18). This may reflect asymmetric vascular enhancement or an additional small metastatic lesion. These results were called by telephone on 2/21/2021at 5:36 pm to provider Dr. Karie Kirks, who verbally acknowledged these results. Electronically Signed   By: Kellie Simmering DO   On: 04/30/2019 17:40   Addendum Date: 04/25/2019   ADDENDUM REPORT: 04/25/2019 14:45 ADDENDUM: CTA and CT perfusion findings called by telephone at the time of interpretation on 04/25/2019 at 2:44 pm to provider MCNEILL [facility] , who verbally acknowledged these results. Electronically Signed   By: Kellie Simmering DO   On: 04/25/2019 14:45   Result Date: 04/30/2019 CLINICAL DATA:  Focal neuro deficit, greater than 6 hours, stroke suspected. Additional history obtained from Dr. Leonel Ramsay: Left-sided neglect, history of breast cancer. EXAM: CT ANGIOGRAPHY HEAD AND NECK CT PERFUSION BRAIN TECHNIQUE: Multidetector CT imaging of the head and neck was performed using the standard protocol during bolus administration of intravenous contrast. Multiplanar CT image reconstructions and MIPs were obtained to evaluate the vascular anatomy. Carotid stenosis measurements (when applicable) are obtained utilizing NASCET criteria, using the distal internal carotid diameter as the denominator. Multiphase CT imaging of the brain was performed following IV bolus contrast injection. Subsequent parametric perfusion maps were calculated using RAPID software. CONTRAST:  163m OMNIPAQUE IOHEXOL 350 MG/ML SOLN COMPARISON:  Concurrently performed noncontrast head CT 04/25/2019, noncontrast head CT 11/17/2018, CT angiogram chest 02/22/2019 FINDINGS: CTA NECK FINDINGS Aortic arch: Standard aortic branching. Mixed plaque within the visualized aortic arch and proximal major branch vessels of the neck. No high-grade innominate or proximal subclavian artery stenosis. Right carotid system: CCA and ICA patent within the neck without measurable stenosis (50% or  greater). Mild scattered calcified plaque within the common carotid artery, carotid bifurcation and proximal right ICA. Left carotid system: CCA and ICA patent within the neck. Mild scattered calcified plaque within the common carotid artery. Moderate mixed plaque within the proximal ICA. Exact quantification of stenosis is difficult due to blooming of calcified plaque. Stenosis within the proximal  ICA is estimated at 40%. Distal to this, the ICA is patent within the neck without stenosis. Vertebral arteries: Left vertebral artery dominant. The vertebral arteries are patent within the neck bilaterally without stenosis. Skeleton: No acute bony abnormality. Cervical spondylosis with multilevel posterior disc osteophytes, uncovertebral and facet hypertrophy. Other neck: No neck mass or cervical lymphadenopathy. Upper chest: A right upper lobe pulmonary nodule is again seen and again measures approximately 10 mm in size (series 6, image 323). This finding is again suspicious for metastatic disease. Pulmonary emphysema. Partially visualized left chest pacer/AICD leads. Review of the MIP images confirms the above findings CTA HEAD FINDINGS Anterior circulation: The intracranial right internal carotid artery is patent with scattered mixed plaque. Sites of mild stenosis within the pre cavernous and cavernous segment. The intracranial left internal carotid artery is patent with scattered calcified plaque. Mild/moderate stenosis within the cavernous segment. The M1 middle cerebral arteries are patent without significant stenosis. No M2 proximal branch occlusion or high-grade proximal stenosis is identified. The anterior cerebral arteries are patent bilaterally without high-grade proximal stenosis. No intracranial aneurysm is identified. Posterior circulation: The non dominant intracranial right vertebral artery is patent and appears to terminate predominantly as the right PICA. The intracranial left vertebral artery is  patent. Mild calcified plaque within this vessel without significant stenosis. The basilar artery is patent without significant stenosis. Predominantly fetal origin of the right posterior cerebral artery with hypoplastic right P1 segment. The posterior cerebral arteries are patent bilaterally without significant proximal stenosis. A left posterior communicating artery is not definitively identified. Venous sinuses: No pertinent prior studies available for comparison. Anatomic variants: As described. Delayed imaging: Delayed venous phase post-contrast imaging demonstrates a 9 mm cortically based enhancing lesion within the right frontoparietal lobes (series 12, image 25). Subtle adjacent leptomeningeal enhancement is also questioned (for instance as seen on series 12, image 24). Additionally, there is a 4 mm enhancing lesion versus asymmetric vascular enhancement within the left cerebellum (series 12, image 9). Review of the MIP images confirms the above findings CT Brain Perfusion Findings: CBF (<30%) Volume: 51m Perfusion (Tmax>6.0s) volume: 052mMismatch Volume: 64m49mnfarction Location:None identified by the perfusion software. IMPRESSION: CTA neck: 1. Atherosclerotic disease as detailed. 2. The right common and internal carotid arteries are patent within the neck without stenosis. 3. The left common and internal carotid arteries are patent within the neck. Moderate mixed plaque results in an estimated 40% stenosis within the proximal left ICA. 4. The bilateral vertebral arteries are patent within the neck without stenosis. Left vertebral artery dominant. 5. Unchanged 10 mm nodule within the right upper lobe suspicious for metastasis. CTA head: 1. No intracranial large vessel occlusion. 2. Atherosclerotic disease within the intracranial internal carotid arteries. Sites of mild stenosis within the pre cavernous and cavernous right ICA. Sites of mild/moderate stenosis within the cavernous left ICA. 3. 10 mm enhancing  cortical lesion within the right frontoparietal lobe. Subtle adjacent leptomeningeal enhancement is also questioned. An additional 4 mm enhancing lesion is questioned within the left cerebellum versus asymmetric vascular enhancement. 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 Contrast-enhanced brain MRI is recommended for further evaluation, if the patient is able to have one. CT perfusion head: The perfusion software identifies no core infarct. The perfusion software identifies no region of critically hypoperfused brain parenchyma utilizing the Tmax>6 seconds threshold. Electronically Signed: By: KylKellie Simmering On: 04/25/2019 14:40   DG Chest 2 View  Result Date: 04/18/2019 CLINICAL DATA:  Lung cancer EXAM: CHEST -  2 VIEW COMPARISON:  CTA chest dated 03/04/2019. Chest radiograph dated 02/28/2019. FINDINGS: Partial right middle lobe collapse with volume loss, best visualized on the lateral view. Underlying central right middle lobe/perihilar mass is better evaluated on CT. Left lung is clear. Mild cardiomegaly. Left subclavian ICD. Thoracic aortic atherosclerosis. Degenerative changes of the visualized thoracolumbar spine. IMPRESSION: Partial right middle lobe collapse. Underlying central right middle lobe/perihilar mass is better evaluated on CT. Electronically Signed   By: Julian Hy M.D.   On: 04/18/2019 08:40   DG Cervical Spine Complete  Result Date: 04/18/2019 CLINICAL DATA:  Chronic neck pain EXAM: CERVICAL SPINE - COMPLETE 4+ VIEW COMPARISON:  None. FINDINGS: Normal cervical lordosis. No evidence of fracture or dislocation. Vertebral body heights are maintained. Dens appears intact. Lateral masses of C1 are symmetric. No prevertebral soft tissue swelling. Mild to moderate multilevel degenerative changes involving the mid/lower cervical spine. Evaluation of the bilateral neural foramina are constrained by obliquity. However, moderate narrowing is suspected on the right at C3-4 and  C4-5. Mild narrowing is suspected on the left at C5-6 and C6-7. Visualized lung apices are clear. IMPRESSION: No fracture is seen. Mild to moderate degenerative changes of the mid/lower cervical spine, with bilateral neural foraminal narrowing, as above. Electronically Signed   By: Julian Hy M.D.   On: 04/18/2019 08:37   DG Forearm Right  Result Date: 04/25/2019 CLINICAL DATA:  Recent fall with forearm pain, initial encounter EXAM: RIGHT FOREARM - 2 VIEW COMPARISON:  None. FINDINGS: There is no evidence of fracture or other focal bone lesions. Soft tissues are unremarkable. IMPRESSION: No acute abnormality noted. Electronically Signed   By: Inez Catalina M.D.   On: 04/25/2019 16:05   CT Code Stroke CTA Neck W/WO contrast  Addendum Date: 04/30/2019   ADDENDUM REPORT: 04/30/2019 17:40 ADDENDUM: While reviewing this case for an upcoming tumor board conference, a focus of asymmetric enhancement is noted within the paramedian right parietooccipital lobe measuring 6 mm (series 12, image 18). This may reflect asymmetric vascular enhancement or an additional small metastatic lesion. These results were called by telephone on 2/21/2021at 5:36 pm to provider Dr. Karie Kirks, who verbally acknowledged these results. Electronically Signed   By: Kellie Simmering DO   On: 04/30/2019 17:40   Addendum Date: 04/25/2019   ADDENDUM REPORT: 04/25/2019 14:45 ADDENDUM: CTA and CT perfusion findings called by telephone at the time of interpretation on 04/25/2019 at 2:44 pm to provider MCNEILL [facility] , who verbally acknowledged these results. Electronically Signed   By: Kellie Simmering DO   On: 04/25/2019 14:45   Result Date: 04/30/2019 CLINICAL DATA:  Focal neuro deficit, greater than 6 hours, stroke suspected. Additional history obtained from Dr. Leonel Ramsay: Left-sided neglect, history of breast cancer. EXAM: CT ANGIOGRAPHY HEAD AND NECK CT PERFUSION BRAIN TECHNIQUE: Multidetector CT imaging of the head and neck was  performed using the standard protocol during bolus administration of intravenous contrast. Multiplanar CT image reconstructions and MIPs were obtained to evaluate the vascular anatomy. Carotid stenosis measurements (when applicable) are obtained utilizing NASCET criteria, using the distal internal carotid diameter as the denominator. Multiphase CT imaging of the brain was performed following IV bolus contrast injection. Subsequent parametric perfusion maps were calculated using RAPID software. CONTRAST:  123m OMNIPAQUE IOHEXOL 350 MG/ML SOLN COMPARISON:  Concurrently performed noncontrast head CT 04/25/2019, noncontrast head CT 11/17/2018, CT angiogram chest 02/22/2019 FINDINGS: CTA NECK FINDINGS Aortic arch: Standard aortic branching. Mixed plaque within the visualized aortic arch and proximal major branch  vessels of the neck. No high-grade innominate or proximal subclavian artery stenosis. Right carotid system: CCA and ICA patent within the neck without measurable stenosis (50% or greater). Mild scattered calcified plaque within the common carotid artery, carotid bifurcation and proximal right ICA. Left carotid system: CCA and ICA patent within the neck. Mild scattered calcified plaque within the common carotid artery. Moderate mixed plaque within the proximal ICA. Exact quantification of stenosis is difficult due to blooming of calcified plaque. Stenosis within the proximal ICA is estimated at 40%. Distal to this, the ICA is patent within the neck without stenosis. Vertebral arteries: Left vertebral artery dominant. The vertebral arteries are patent within the neck bilaterally without stenosis. Skeleton: No acute bony abnormality. Cervical spondylosis with multilevel posterior disc osteophytes, uncovertebral and facet hypertrophy. Other neck: No neck mass or cervical lymphadenopathy. Upper chest: A right upper lobe pulmonary nodule is again seen and again measures approximately 10 mm in size (series 6, image  323). This finding is again suspicious for metastatic disease. Pulmonary emphysema. Partially visualized left chest pacer/AICD leads. Review of the MIP images confirms the above findings CTA HEAD FINDINGS Anterior circulation: The intracranial right internal carotid artery is patent with scattered mixed plaque. Sites of mild stenosis within the pre cavernous and cavernous segment. The intracranial left internal carotid artery is patent with scattered calcified plaque. Mild/moderate stenosis within the cavernous segment. The M1 middle cerebral arteries are patent without significant stenosis. No M2 proximal branch occlusion or high-grade proximal stenosis is identified. The anterior cerebral arteries are patent bilaterally without high-grade proximal stenosis. No intracranial aneurysm is identified. Posterior circulation: The non dominant intracranial right vertebral artery is patent and appears to terminate predominantly as the right PICA. The intracranial left vertebral artery is patent. Mild calcified plaque within this vessel without significant stenosis. The basilar artery is patent without significant stenosis. Predominantly fetal origin of the right posterior cerebral artery with hypoplastic right P1 segment. The posterior cerebral arteries are patent bilaterally without significant proximal stenosis. A left posterior communicating artery is not definitively identified. Venous sinuses: No pertinent prior studies available for comparison. Anatomic variants: As described. Delayed imaging: Delayed venous phase post-contrast imaging demonstrates a 9 mm cortically based enhancing lesion within the right frontoparietal lobes (series 12, image 25). Subtle adjacent leptomeningeal enhancement is also questioned (for instance as seen on series 12, image 24). Additionally, there is a 4 mm enhancing lesion versus asymmetric vascular enhancement within the left cerebellum (series 12, image 9). Review of the MIP images  confirms the above findings CT Brain Perfusion Findings: CBF (<30%) Volume: 58m Perfusion (Tmax>6.0s) volume: 03mMismatch Volume: 29m64mnfarction Location:None identified by the perfusion software. IMPRESSION: CTA neck: 1. Atherosclerotic disease as detailed. 2. The right common and internal carotid arteries are patent within the neck without stenosis. 3. The left common and internal carotid arteries are patent within the neck. Moderate mixed plaque results in an estimated 40% stenosis within the proximal left ICA. 4. The bilateral vertebral arteries are patent within the neck without stenosis. Left vertebral artery dominant. 5. Unchanged 10 mm nodule within the right upper lobe suspicious for metastasis. CTA head: 1. No intracranial large vessel occlusion. 2. Atherosclerotic disease within the intracranial internal carotid arteries. Sites of mild stenosis within the pre cavernous and cavernous right ICA. Sites of mild/moderate stenosis within the cavernous left ICA. 3. 10 mm enhancing cortical lesion within the right frontoparietal lobe. Subtle adjacent leptomeningeal enhancement is also questioned. An additional 4 mm enhancing lesion is  questioned within the left cerebellum versus asymmetric vascular enhancement. 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 Contrast-enhanced brain MRI is recommended for further evaluation, if the patient is able to have one. CT perfusion head: The perfusion software identifies no core infarct. The perfusion software identifies no region of critically hypoperfused brain parenchyma utilizing the Tmax>6 seconds threshold. Electronically Signed: By: Kellie Simmering DO On: 04/25/2019 14:40   DG Pelvis Portable  Result Date: 04/25/2019 CLINICAL DATA:  Recent fall EXAM: PORTABLE PELVIS 1-2 VIEWS COMPARISON:  05/27/17 FINDINGS: Contrast material is noted within the urinary bladder. No acute fracture or dislocation is seen. Degenerative changes the lumbar spine and hip joints  are noted. IMPRESSION: No acute abnormality noted. Electronically Signed   By: Inez Catalina M.D.   On: 04/25/2019 16:04   CT C-SPINE NO CHARGE  Result Date: 04/25/2019 CLINICAL DATA:  Trauma. Patient was found down. EXAM: CT CERVICAL SPINE WITHOUT CONTRAST TECHNIQUE: Multidetector CT imaging of the cervical spine was performed without intravenous contrast. Multiplanar CT image reconstructions were also generated. COMPARISON:  Radiographs dated 04/17/2019 and CT scan dated 10/21/2018 FINDINGS: Alignment: Normal. Skull base and vertebrae: No acute fracture. No primary bone lesion or focal pathologic process. Soft tissues and spinal canal: No prevertebral fluid or swelling. No visible canal hematoma. Disc levels: There is disc space narrowing at C5-6 with a small broad-based disc osteophyte complex with slight left foraminal stenosis. There is disc space narrowing at C6-7. No neural impingement. There is ankylosis of the facet joints bilaterally at C3-4. Severe right facet arthritis at C4-5 without foraminal stenosis. Upper chest: Incompletely visualized irregular faint lesion in the posterior aspect of the right upper lobe as demonstrated on the prior chest CT of 10/21/2018. Other: Aortic atherosclerosis. AICD in place. IMPRESSION: No acute abnormality of the cervical spine. Degenerative disc and joint disease. Aortic Atherosclerosis (ICD10-I70.0). Electronically Signed   By: Lorriane Shire M.D.   On: 04/25/2019 14:35   CT Code Stroke Cerebral Perfusion with contrast  Addendum Date: 04/30/2019   ADDENDUM REPORT: 04/30/2019 17:40 ADDENDUM: While reviewing this case for an upcoming tumor board conference, a focus of asymmetric enhancement is noted within the paramedian right parietooccipital lobe measuring 6 mm (series 12, image 18). This may reflect asymmetric vascular enhancement or an additional small metastatic lesion. These results were called by telephone on 2/21/2021at 5:36 pm to provider Dr. Karie Kirks,  who verbally acknowledged these results. Electronically Signed   By: Kellie Simmering DO   On: 04/30/2019 17:40   Addendum Date: 04/25/2019   ADDENDUM REPORT: 04/25/2019 14:45 ADDENDUM: CTA and CT perfusion findings called by telephone at the time of interpretation on 04/25/2019 at 2:44 pm to provider MCNEILL [facility] , who verbally acknowledged these results. Electronically Signed   By: Kellie Simmering DO   On: 04/25/2019 14:45   Result Date: 04/30/2019 CLINICAL DATA:  Focal neuro deficit, greater than 6 hours, stroke suspected. Additional history obtained from Dr. Leonel Ramsay: Left-sided neglect, history of breast cancer. EXAM: CT ANGIOGRAPHY HEAD AND NECK CT PERFUSION BRAIN TECHNIQUE: Multidetector CT imaging of the head and neck was performed using the standard protocol during bolus administration of intravenous contrast. Multiplanar CT image reconstructions and MIPs were obtained to evaluate the vascular anatomy. Carotid stenosis measurements (when applicable) are obtained utilizing NASCET criteria, using the distal internal carotid diameter as the denominator. Multiphase CT imaging of the brain was performed following IV bolus contrast injection. Subsequent parametric perfusion maps were calculated using RAPID software. CONTRAST:  138m OMNIPAQUE  IOHEXOL 350 MG/ML SOLN COMPARISON:  Concurrently performed noncontrast head CT 04/25/2019, noncontrast head CT 11/17/2018, CT angiogram chest 02/22/2019 FINDINGS: CTA NECK FINDINGS Aortic arch: Standard aortic branching. Mixed plaque within the visualized aortic arch and proximal major branch vessels of the neck. No high-grade innominate or proximal subclavian artery stenosis. Right carotid system: CCA and ICA patent within the neck without measurable stenosis (50% or greater). Mild scattered calcified plaque within the common carotid artery, carotid bifurcation and proximal right ICA. Left carotid system: CCA and ICA patent within the neck. Mild scattered calcified  plaque within the common carotid artery. Moderate mixed plaque within the proximal ICA. Exact quantification of stenosis is difficult due to blooming of calcified plaque. Stenosis within the proximal ICA is estimated at 40%. Distal to this, the ICA is patent within the neck without stenosis. Vertebral arteries: Left vertebral artery dominant. The vertebral arteries are patent within the neck bilaterally without stenosis. Skeleton: No acute bony abnormality. Cervical spondylosis with multilevel posterior disc osteophytes, uncovertebral and facet hypertrophy. Other neck: No neck mass or cervical lymphadenopathy. Upper chest: A right upper lobe pulmonary nodule is again seen and again measures approximately 10 mm in size (series 6, image 323). This finding is again suspicious for metastatic disease. Pulmonary emphysema. Partially visualized left chest pacer/AICD leads. Review of the MIP images confirms the above findings CTA HEAD FINDINGS Anterior circulation: The intracranial right internal carotid artery is patent with scattered mixed plaque. Sites of mild stenosis within the pre cavernous and cavernous segment. The intracranial left internal carotid artery is patent with scattered calcified plaque. Mild/moderate stenosis within the cavernous segment. The M1 middle cerebral arteries are patent without significant stenosis. No M2 proximal branch occlusion or high-grade proximal stenosis is identified. The anterior cerebral arteries are patent bilaterally without high-grade proximal stenosis. No intracranial aneurysm is identified. Posterior circulation: The non dominant intracranial right vertebral artery is patent and appears to terminate predominantly as the right PICA. The intracranial left vertebral artery is patent. Mild calcified plaque within this vessel without significant stenosis. The basilar artery is patent without significant stenosis. Predominantly fetal origin of the right posterior cerebral artery with  hypoplastic right P1 segment. The posterior cerebral arteries are patent bilaterally without significant proximal stenosis. A left posterior communicating artery is not definitively identified. Venous sinuses: No pertinent prior studies available for comparison. Anatomic variants: As described. Delayed imaging: Delayed venous phase post-contrast imaging demonstrates a 9 mm cortically based enhancing lesion within the right frontoparietal lobes (series 12, image 25). Subtle adjacent leptomeningeal enhancement is also questioned (for instance as seen on series 12, image 24). Additionally, there is a 4 mm enhancing lesion versus asymmetric vascular enhancement within the left cerebellum (series 12, image 9). Review of the MIP images confirms the above findings CT Brain Perfusion Findings: CBF (<30%) Volume: 9m Perfusion (Tmax>6.0s) volume: 025mMismatch Volume: 5m58mnfarction Location:None identified by the perfusion software. IMPRESSION: CTA neck: 1. Atherosclerotic disease as detailed. 2. The right common and internal carotid arteries are patent within the neck without stenosis. 3. The left common and internal carotid arteries are patent within the neck. Moderate mixed plaque results in an estimated 40% stenosis within the proximal left ICA. 4. The bilateral vertebral arteries are patent within the neck without stenosis. Left vertebral artery dominant. 5. Unchanged 10 mm nodule within the right upper lobe suspicious for metastasis. CTA head: 1. No intracranial large vessel occlusion. 2. Atherosclerotic disease within the intracranial internal carotid arteries. Sites of mild stenosis within the  pre cavernous and cavernous right ICA. Sites of mild/moderate stenosis within the cavernous left ICA. 3. 10 mm enhancing cortical lesion within the right frontoparietal lobe. Subtle adjacent leptomeningeal enhancement is also questioned. An additional 4 mm enhancing lesion is questioned within the left cerebellum versus  asymmetric vascular enhancement. 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 Contrast-enhanced brain MRI is recommended for further evaluation, if the patient is able to have one. CT perfusion head: The perfusion software identifies no core infarct. The perfusion software identifies no region of critically hypoperfused brain parenchyma utilizing the Tmax>6 seconds threshold. Electronically Signed: By: Kellie Simmering DO On: 04/25/2019 14:40   DG Shoulder Left  Result Date: 04/18/2019 CLINICAL DATA:  Left shoulder pain, lung cancer EXAM: LEFT SHOULDER - 2+ VIEW COMPARISON:  None. FINDINGS: No fracture or dislocation is seen. The joint spaces are preserved. The visualized soft tissues are unremarkable. Visualized lungs are better evaluated on dedicated chest radiographs. IMPRESSION: Negative. Electronically Signed   By: Julian Hy M.D.   On: 04/18/2019 08:41   EEG adult  Result Date: 04/25/2019 Lora Havens, MD     04/26/2019  8:48 AM Patient Name: PERMELIA BAMBA MRN: [id number] Epilepsy Attending: Lora Havens Referring Physician/Provider: Dr Zane Herald Date: 04/25/2019 Duration: 42.27 mins Patient history: TEJASVI BRISSETT is a 79 y.o. female with a history of adenocarcinoma of the lung who presents after being found down and unresponsive by her daughter today. She also had a left gaze preference per EMS on arrival.  She was not speaking at all on their arrival, began improving on route. EEG to evaluate for status. Level of alertness: lethargic AEDs during EEG study: Ativan Technical aspects: This EEG study was done with scalp electrodes positioned according to the 10-20 International system of electrode placement. Electrical activity was acquired at a sampling rate of 500Hz and reviewed with a high frequency filter of 70Hz and a low frequency filter of 1Hz. EEG data were recorded continuously and digitally stored. DESCRIPTION: No clear posterior dominant rhythm was seen. EEG  showed continuous generalized polymorphic high amplitude 5-6hz theta slowing. IV ativan was administered during the study after which per neurologist Dr Leonel Ramsay, patient clinically appeared improved. EEG continued to show continuous 5-6hz theta slowing with lower amplitude. Hyperventilation and photic stimulation were not performed. ABNORMALITY - Continuous slow, generalized IMPRESSION: This study is suggestive of moderate diffuse encephalopathy, non specific to etiology. Per bedside neurologist, patient improved after IV ativan which although not specific does raise suspicion for ictal/post-ictal activity. No definite seizures or epileptiform discharges were seen throughout the recording. Priyanka Barbra Sarks   Overnight EEG with video  Result Date: 04/27/2019 Lora Havens, MD     04/27/2019  2:04 PM Patient Name: LYNDEL DANCEL MRN: [id number] Epilepsy Attending: Lora Havens Referring Physician/Provider: Dr Zane Herald Duration:  04/26/2019 1154 to 04/27/2019 1124  Patient history: Glorianne Proctor Smithis a 79 y.o.femalewith a history of adenocarcinoma of the lung who presents after being found down and unresponsive by her daughter today. She also had a left gaze preference per EMS on arrival. She was not speaking at all on their arrival,began improving on route. EEG to evaluate for status.  Level of alertness:  Awake, sleep  AEDs during EEG study:  Keppra  Technical aspects: This EEG study was done with scalp electrodes positioned according to the 10-20 International system of electrode placement. Electrical activity was acquired at a sampling rate of 500Hz and reviewed with a high frequency filter  of 70Hz and a low frequency filter of 1Hz. EEG data were recorded continuously and digitally stored.  DESCRIPTION:  The posterior dominant rhythm consists of 7.5-8 Hz activity of moderate voltage (25-35 uV) seen predominantly in posterior head regions, symmetric and reactive to eye opening and eye  closing.      Sleep was characterized by vertex waves, sleep spindles (13 to 15 Hz), maximal frontocentral as well as REM sleep.  EEG showed continuous generalized 5 to 7 Hz theta slowing. Spikes were also seen in the right parieto-occipital region, maximal P4/P8/O2 ABNORMALITY - Continuous slow, generalized - Spike, right parieto-occipital region, maximal P4/P8/O2 IMPRESSION: This study is suggestive of epileptogenicity arising from the right parieto-occipital region as well as mild diffuse encephalopathy, non specific to etiology. No definite seizures were seen throughout the recording. EEG appears to be improving compared to previous routine study on 04/25/2019. Lora Havens   ECHOCARDIOGRAM COMPLETE  Result Date: 04/26/2019    ECHOCARDIOGRAM REPORT   Patient Name:   CHAKA BOYSON Date of Exam: 04/26/2019 Medical Rec #:  [id number]     Height:       61.0 in Accession #:    [id number]    Weight:       166.2 lb Date of Birth:  08-Jul-1940      BSA:          1.75 m Patient Age:    69 years      BP:           124/64 mmHg Patient Gender: F             HR:           79 bpm. Exam Location:  Inpatient Procedure: 2D Echo, Color Doppler and Cardiac Doppler Indications:    I48.91* Unspecified atrial fibrillation  History:        Patient has prior history of Echocardiogram examinations, most                 recent 10/23/2018. Pacemaker and Defibrillator, Arrythmias:LBBB;                 Risk Factors:Hypertension, Diabetes and Dyslipidemia.  Sonographer:    Raquel Sarna Senior RDCS Referring Phys: [id number] JENNIFER CHOI  Sonographer Comments: Technically difficult study due to poor echo windows. IMPRESSIONS  1. Technically difficult study. Not all wall segments visualized. EF mildly decreased, with mild hypokinesis of the basal inferoseptum, inferior, and inferolateral walls. Basal anterolateral wall not well seen. When compared to prior study, these walls were better seen on previous study and were within normal limits.  2. Left  ventricular ejection fraction, by estimation, is 45 to 50%. The left ventricle has mildly decreased function. The left ventricle demonstrates regional wall motion abnormalities (see scoring diagram/findings for description). There is mild asymmetric left ventricular hypertrophy. Left ventricular diastolic parameters are indeterminate.  3. Right ventricular systolic function was not well visualized. The right ventricular size is not well visualized. There is normal pulmonary artery systolic pressure.  4. The mitral valve is grossly normal. No evidence of mitral valve regurgitation. No evidence of mitral stenosis.  5. The aortic valve is grossly normal. Aortic valve regurgitation is not visualized. No aortic stenosis is present. FINDINGS  Left Ventricle: Left ventricular ejection fraction, by estimation, is 45 to 50%. The left ventricle has mildly decreased function. The left ventricle demonstrates regional wall motion abnormalities. The left ventricular internal cavity size was normal in size. There is mild asymmetric left ventricular hypertrophy. Abnormal (  paradoxical) septal motion, consistent with RV pacemaker. Left ventricular diastolic parameters are indeterminate.  LV Wall Scoring: The basal inferolateral segment, basal anterolateral segment, and basal inferior segment are hypokinetic. The mid and distal anterior wall, mid and distal lateral wall, entire septum, entire apex, mid and distal inferior wall, and mid anterolateral segment are normal. Right Ventricle: The right ventricular size is not well visualized. Right vetricular wall thickness was not assessed. Right ventricular systolic function was not well visualized. There is normal pulmonary artery systolic pressure. The tricuspid regurgitant velocity is 2.38 m/s, and with an assumed right atrial pressure of 3 mmHg, the estimated right ventricular systolic pressure is 31.4 mmHg. Left Atrium: Left atrial size was normal in size. Right Atrium: Right atrial  size was not well visualized. Pericardium: There is no evidence of pericardial effusion. Mitral Valve: The mitral valve is grossly normal. No evidence of mitral valve regurgitation. No evidence of mitral valve stenosis. Tricuspid Valve: The tricuspid valve is not well visualized. Tricuspid valve regurgitation is not demonstrated. No evidence of tricuspid stenosis. Aortic Valve: The aortic valve is grossly normal. Aortic valve regurgitation is not visualized. No aortic stenosis is present. Pulmonic Valve: The pulmonic valve was not well visualized. Pulmonic valve regurgitation is not visualized. Aorta: The aortic root and ascending aorta are structurally normal, with no evidence of dilitation. IAS/Shunts: The atrial septum is grossly normal. Additional Comments: A pacer wire is visualized.  LEFT VENTRICLE PLAX 2D LVIDd:         4.55 cm  Diastology LVIDs:         3.63 cm  LV e' lateral:   4.46 cm/s LV PW:         0.74 cm  LV E/e' lateral: 13.6 LV IVS:        1.39 cm  LV e' medial:    4.90 cm/s LVOT diam:     1.90 cm  LV E/e' medial:  12.4 LV SV:         34.02 ml LV SV Index:   21.49 LVOT Area:     2.84 cm  LEFT ATRIUM             Index LA diam:        2.30 cm 1.32 cm/m LA Vol (A2C):   50.8 ml 29.10 ml/m LA Vol (A4C):   36.1 ml 20.68 ml/m LA Biplane Vol: 44.1 ml 25.26 ml/m  AORTIC VALVE LVOT Vmax:   66.60 cm/s LVOT Vmean:  46.600 cm/s LVOT VTI:    0.120 m  AORTA Ao Asc diam: 2.80 cm MITRAL VALVE               TRICUSPID VALVE MV Area (PHT): 2.71 cm    TR Peak grad:   22.7 mmHg MV Decel Time: 280 msec    TR Vmax:        238.00 cm/s MV E velocity: 60.80 cm/s MV A velocity: 48.00 cm/s  SHUNTS MV E/A ratio:  1.27        Systemic VTI:  0.12 m                            Systemic Diam: 1.90 cm Buford Dresser MD Electronically signed by Buford Dresser MD Signature Date/Time: 04/26/2019/9:40:19 PM    Final    CUP PACEART REMOTE DEVICE CHECK  Result Date: 04/18/2019 Scheduled remote reviewed.  Normal device  function.  Next remote 91 days.  CT HEAD CODE STROKE WO  CONTRAST  Result Date: 04/25/2019 CLINICAL DATA:  Code stroke. Possible stroke; cerebral hemorrhage suspected. Additional history obtained from Dr. Leonel Ramsay: Patient presenting with left-sided neglect, history of breast cancer, possible metastases. EXAM: CT HEAD WITHOUT CONTRAST TECHNIQUE: Contiguous axial images were obtained from the base of the skull through the vertex without intravenous contrast. COMPARISON:  Head CT 11/17/2018 FINDINGS: Brain: Mildly motion degraded examination There is no evidence of acute intracranial hemorrhage or acute demarcated cortical infarction. There is a moderate to large region of hypodensity within the right frontoparietal white matter without definite loss of gray-white differentiation. Mild background ill-defined hypoattenuation within the cerebral white matter is nonspecific, but consistent with chronic small vessel ischemic disease. 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 There is no midline shift or extra-axial fluid collection. Moderate generalized parenchymal atrophy. Vascular: No hyperdense vessel.  Atherosclerotic calcifications. Skull: Normal. Negative for fracture or focal lesion. Sinuses/Orbits: Visualized orbits demonstrate no acute abnormality. No significant paranasal sinus disease or mastoid effusion. These results were called by telephone at the time of interpretation on 04/25/2019 at 2:00 pm to provider Dr. Leonel Ramsay, who verbally acknowledged these results. IMPRESSION: No evidence of acute intracranial hemorrhage. Moderate to large region of hypoattenuation within the right frontoparietal white matter. Findings are suspicious for vasogenic edema secondary to an underlying mass, possibly related to reported history of breast cancer. An acute white matter infarct cannot be excluded. Contrast-enhanced brain MRI is recommended for further evaluation. No definite loss  of gray-white differentiation. Background moderate generalized parenchymal atrophy with mild chronic small vessel ischemic disease. Redemonstrated chronic lacunar infarcts within bilateral basal ganglia. Electronically Signed   By: Kellie Simmering DO   On: 04/25/2019 14:08    Microbiology: Recent Results (from the past 240 hour(s))  SARS CORONAVIRUS 2 (TAT 6-24 HRS) Nasopharyngeal Nasopharyngeal Swab     Status: None   Collection Time: 04/25/19  3:20 PM   Specimen: Nasopharyngeal Swab  Result Value Ref Range Status   SARS Coronavirus 2 NEGATIVE NEGATIVE Final    Comment: (NOTE) SARS-CoV-2 target nucleic acids are NOT DETECTED. The SARS-CoV-2 RNA is generally detectable in upper and lower respiratory specimens during the acute phase of infection. Negative results do not preclude SARS-CoV-2 infection, do not rule out co-infections with other pathogens, and should not be used as the sole basis for treatment or other patient management decisions. Negative results must be combined with clinical observations, patient history, and epidemiological information. The expected result is Negative. Fact Sheet for Patients: SugarRoll[employer]be Fact Sheet for Healthcare Providers: https://www.woods-mathews.com/ This test is not yet approved or cleared by the [place] FDA and  has been authorized for detection and/or diagnosis of SARS-CoV-2 by FDA under an Emergency Use Authorization (EUA). This EUA will remain  in effect (meaning this test can be used) for the duration of the COVID-19 declaration under Section 56 4(b)(1) of the Act, 21 U.S.C. section 360bbb-3(b)(1), unless the authorization is terminated or revoked sooner. Performed at [facility], [address] [place], [place] [address]   SARS CORONAVIRUS 2 (TAT 6-24 HRS) Nasopharyngeal Nasopharyngeal Swab     Status: None   Collection Time: 05/01/19  4:32 PM   Specimen: Nasopharyngeal Swab  Result  Value Ref Range Status   SARS Coronavirus 2 NEGATIVE NEGATIVE Final    Comment: (NOTE) SARS-CoV-2 target nucleic acids are NOT DETECTED. The SARS-CoV-2 RNA is generally detectable in upper and lower respiratory specimens during the acute phase of infection. Negative results do not preclude SARS-CoV-2  infection, do not rule out co-infections with other pathogens, and should not be used as the sole basis for treatment or other patient management decisions. Negative results must be combined with clinical observations, patient history, and epidemiological information. The expected result is Negative. Fact Sheet for Patients: SugarRoll.be Fact Sheet for Healthcare Providers: https://www.woods-mathews.com/ This test is not yet approved or cleared by the [place] FDA and  has been authorized for detection and/or diagnosis of SARS-CoV-2 by FDA under an Emergency Use Authorization (EUA). This EUA will remain  in effect (meaning this test can be used) for the duration of the COVID-19 declaration under Section 56 4(b)(1) of the Act, 21 U.S.C. section 360bbb-3(b)(1), unless the authorization is terminated or revoked sooner. Performed at [facility], [place] [address], [place], [place] [address]      Labs: Basic Metabolic Panel: Recent Labs  Lab 04/25/19 1425 04/25/19 1425 04/26/19 0116 04/27/19 0331 04/28/19 0416 05/01/19 0245 05/02/19 0416  NA 132*  --  138 139 142 141  --   K 3.9  --  4.0 4.0 3.7 4.0  --   CL 94*  --  102 105 107 108  --   CO2 20*  --  16* _0 --   GLUCOSE 256*  --  254* 163* 112* 166*  --   BUN 22  --  25* 39* 25* 15  --   CREATININE 0.99   < > 1.02* 1.09* 0.88 0.75 0.88  CALCIUM 9.6  --  9.1 8.7* 8.7* 8.4*  --    < > = values in this interval not displayed.   Liver Function Tests: Recent Labs  Lab 04/25/19 1425  AST 24  ALT 21  ALKPHOS 62  BILITOT 1.3*  PROT 8.0  ALBUMIN 4.1   No results for  input(s): LIPASE, AMYLASE in the last 168 hours. No results for input(s): AMMONIA in the last 168 hours. CBC: Recent Labs  Lab 04/25/19 1339 04/25/19 1425 04/26/19 0116 04/28/19 0416 05/01/19 0245  WBC  --  15.8* 17.4* 13.0* 12.2*  NEUTROABS  --  12.5*  --  10.8* 9.4*  HGB 15.3* 15.2* 14.4 13.7 13.0  HCT 45.0 46.0 43.5 41.4 40.0  MCV  --  87.6 86.7 87.9 88.3  PLT  --  316 312 289 237   Cardiac Enzymes: Recent Labs  Lab 04/25/19 1425  CKTOTAL 233   BNP: BNP (last 3 results) Recent Labs    02/22/19 0032 02/28/19 1822  BNP 242.1* 211.1*    ProBNP (last 3 results) No results for input(s): PROBNP in the last 8760 hours.  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

## 2019-05-02 NOTE — Progress Notes (Signed)
Pt approved for SNF rehab under her [facility]  Approval number: [id number] 05/01/2019--05/03/2019 Mat Carne

## 2019-05-02 NOTE — Patient Instructions (Signed)
none

## 2019-05-02 NOTE — TOC Transition Note (Signed)
Transition of Care [facility]) - CM/SW Discharge Note   Patient Details  Name: Carolyn [name] MRN: [id number] Date of Birth: 26-Jul-1940  [facility] CM/SW Contact:  Pollie Friar, RN Phone Number: 05/02/2019, 12:56 PM   Clinical Narrative:    Pt discharging to [facility] today. Pt and daughter: Carolyn [name] aware and in agreement.  Pt to transport via [facility]. Report called and pickup scheduled for 4:30 pm. D/c packet at the desk.   Room: 108p Number for report: [phone]   Final next level of care: Skilled Nursing Facility Barriers to Discharge: No Barriers Identified   Patient Goals and CMS Choice   CMS Medicare.gov Compare Post Acute Care list provided to:: Patient Represenative (must comment) Choice offered to / list presented to : Adult Children(daughter)  Discharge Placement              Patient chooses bed at: [facility] Patient to be transferred to facility by: [place] Name of family member notified: Carolyn [name] Patient and family notified of of transfer: 05/02/19  Discharge Plan and Services                                     Social Determinants of Health (SDOH) Interventions     Readmission Risk Interventions No flowsheet data found.

## 2019-05-02 NOTE — Progress Notes (Signed)
Patient ID: Carolyn [name], female   DOB: [birth year], 79 y.o.   MRN: [id number]  Phone visit  - pt no answer on multiple tries, left mesage

## 2019-05-02 NOTE — Progress Notes (Signed)
Patient blood sugar dropped to 55 this morning.Fed patient 2 packs of graham crackers with peanut and rechecked blood sugar up to 96. Text paged NP Sharlet Salina.

## 2019-05-03 ENCOUNTER — Inpatient Hospital Stay: Payer: Medicare Other

## 2019-05-04 ENCOUNTER — Telehealth: Payer: Self-pay | Admitting: *Deleted

## 2019-05-04 LAB — GLUCOSE, CAPILLARY: Glucose-Capillary: 242 mg/dL — ABNORMAL HIGH (ref 70–99)

## 2019-05-04 NOTE — Telephone Encounter (Signed)
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 Pt was found by her daughter laying on the floor with some bruising and skin tears. Had left gaze preference per EMS on arrival. She was not verbal per EMS. Patient went into A Fib RVR during ED stay and given IV lopressor with improvement in HR.Although the patient was planned to undergo MRI today, this will not be possible as the patient has had an AICD/pacer placed on 02/21/2015. Pt D/c 05/02/19 to SNF and will follow up with Dr. Mickeal Skinner as outpatient.Marland KitchenJohny Chess

## 2019-05-05 ENCOUNTER — Telehealth: Payer: Self-pay

## 2019-05-05 NOTE — Telephone Encounter (Signed)
Immunizations faxed to [facility] per NP request.

## 2019-05-05 NOTE — Telephone Encounter (Signed)
NP for rehab called in needing to know patients updated immunizations. She stated that it would be ok to fax since she couldn't talk with nurse   Fax number given : [phone] attn: Joelene Millin

## 2019-05-08 DIAGNOSIS — C342 Malignant neoplasm of middle lobe, bronchus or lung: Secondary | ICD-10-CM | POA: Diagnosis not present

## 2019-05-08 DIAGNOSIS — Z794 Long term (current) use of insulin: Secondary | ICD-10-CM | POA: Diagnosis not present

## 2019-05-08 DIAGNOSIS — I5032 Chronic diastolic (congestive) heart failure: Secondary | ICD-10-CM | POA: Diagnosis not present

## 2019-05-08 DIAGNOSIS — C3431 Malignant neoplasm of lower lobe, right bronchus or lung: Secondary | ICD-10-CM | POA: Diagnosis not present

## 2019-05-08 DIAGNOSIS — C719 Malignant neoplasm of brain, unspecified: Secondary | ICD-10-CM | POA: Diagnosis not present

## 2019-05-08 DIAGNOSIS — R531 Weakness: Secondary | ICD-10-CM | POA: Diagnosis not present

## 2019-05-08 DIAGNOSIS — G936 Cerebral edema: Secondary | ICD-10-CM | POA: Diagnosis not present

## 2019-05-08 DIAGNOSIS — Z923 Personal history of irradiation: Secondary | ICD-10-CM | POA: Diagnosis not present

## 2019-05-08 DIAGNOSIS — R2681 Unsteadiness on feet: Secondary | ICD-10-CM | POA: Diagnosis not present

## 2019-05-08 DIAGNOSIS — I480 Paroxysmal atrial fibrillation: Secondary | ICD-10-CM | POA: Diagnosis not present

## 2019-05-08 DIAGNOSIS — C7931 Secondary malignant neoplasm of brain: Secondary | ICD-10-CM | POA: Diagnosis not present

## 2019-05-08 DIAGNOSIS — M25512 Pain in left shoulder: Secondary | ICD-10-CM | POA: Diagnosis not present

## 2019-05-08 DIAGNOSIS — E1151 Type 2 diabetes mellitus with diabetic peripheral angiopathy without gangrene: Secondary | ICD-10-CM | POA: Diagnosis not present

## 2019-05-08 DIAGNOSIS — E119 Type 2 diabetes mellitus without complications: Secondary | ICD-10-CM | POA: Diagnosis not present

## 2019-05-08 DIAGNOSIS — Z4802 Encounter for removal of sutures: Secondary | ICD-10-CM | POA: Diagnosis not present

## 2019-05-08 DIAGNOSIS — G9340 Encephalopathy, unspecified: Secondary | ICD-10-CM | POA: Diagnosis not present

## 2019-05-08 DIAGNOSIS — L98499 Non-pressure chronic ulcer of skin of other sites with unspecified severity: Secondary | ICD-10-CM | POA: Diagnosis not present

## 2019-05-08 DIAGNOSIS — E1165 Type 2 diabetes mellitus with hyperglycemia: Secondary | ICD-10-CM | POA: Diagnosis not present

## 2019-05-08 DIAGNOSIS — I10 Essential (primary) hypertension: Secondary | ICD-10-CM | POA: Diagnosis not present

## 2019-05-08 DIAGNOSIS — Z95810 Presence of automatic (implantable) cardiac defibrillator: Secondary | ICD-10-CM | POA: Diagnosis not present

## 2019-05-08 DIAGNOSIS — S51801A Unspecified open wound of right forearm, initial encounter: Secondary | ICD-10-CM | POA: Diagnosis not present

## 2019-05-08 DIAGNOSIS — F17210 Nicotine dependence, cigarettes, uncomplicated: Secondary | ICD-10-CM | POA: Diagnosis not present

## 2019-05-08 DIAGNOSIS — J449 Chronic obstructive pulmonary disease, unspecified: Secondary | ICD-10-CM | POA: Diagnosis not present

## 2019-05-08 DIAGNOSIS — M6281 Muscle weakness (generalized): Secondary | ICD-10-CM | POA: Diagnosis not present

## 2019-05-08 DIAGNOSIS — E785 Hyperlipidemia, unspecified: Secondary | ICD-10-CM | POA: Diagnosis not present

## 2019-05-08 DIAGNOSIS — C3491 Malignant neoplasm of unspecified part of right bronchus or lung: Secondary | ICD-10-CM | POA: Diagnosis not present

## 2019-05-08 DIAGNOSIS — F349 Persistent mood [affective] disorder, unspecified: Secondary | ICD-10-CM | POA: Diagnosis not present

## 2019-05-08 DIAGNOSIS — R488 Other symbolic dysfunctions: Secondary | ICD-10-CM | POA: Diagnosis not present

## 2019-05-08 DIAGNOSIS — C3490 Malignant neoplasm of unspecified part of unspecified bronchus or lung: Secondary | ICD-10-CM | POA: Diagnosis not present

## 2019-05-08 DIAGNOSIS — K219 Gastro-esophageal reflux disease without esophagitis: Secondary | ICD-10-CM | POA: Diagnosis not present

## 2019-05-08 DIAGNOSIS — R569 Unspecified convulsions: Secondary | ICD-10-CM | POA: Diagnosis not present

## 2019-05-08 DIAGNOSIS — M5412 Radiculopathy, cervical region: Secondary | ICD-10-CM | POA: Diagnosis not present

## 2019-05-08 DIAGNOSIS — E039 Hypothyroidism, unspecified: Secondary | ICD-10-CM | POA: Diagnosis not present

## 2019-05-09 ENCOUNTER — Telehealth: Payer: Self-pay

## 2019-05-09 NOTE — Telephone Encounter (Signed)
Left message for patient to remind of missed remote transmission.  

## 2019-05-10 DIAGNOSIS — Z4802 Encounter for removal of sutures: Secondary | ICD-10-CM | POA: Diagnosis not present

## 2019-05-10 DIAGNOSIS — E1165 Type 2 diabetes mellitus with hyperglycemia: Secondary | ICD-10-CM | POA: Diagnosis not present

## 2019-05-10 DIAGNOSIS — C719 Malignant neoplasm of brain, unspecified: Secondary | ICD-10-CM | POA: Diagnosis not present

## 2019-05-10 DIAGNOSIS — S51801A Unspecified open wound of right forearm, initial encounter: Secondary | ICD-10-CM | POA: Diagnosis not present

## 2019-05-11 ENCOUNTER — Other Ambulatory Visit: Payer: Self-pay

## 2019-05-11 ENCOUNTER — Ambulatory Visit (HOSPITAL_COMMUNITY)
Admission: RE | Admit: 2019-05-11 | Discharge: 2019-05-11 | Disposition: A | Discharge status: Home / Self Care | Payer: Medicare Other | Source: Ambulatory Visit | Attending: Internal Medicine | Admitting: Internal Medicine

## 2019-05-11 ENCOUNTER — Encounter (HOSPITAL_COMMUNITY): Payer: Self-pay

## 2019-05-11 ENCOUNTER — Other Ambulatory Visit: Payer: Self-pay | Admitting: Radiation Therapy

## 2019-05-11 ENCOUNTER — Inpatient Hospital Stay: Payer: Medicare Other | Attending: Internal Medicine | Admitting: Internal Medicine

## 2019-05-11 VITALS — BP 150/61 | HR 73 | Temp 98.0°F | Resp 18 | Ht 61.0 in | Wt 169.0 lb

## 2019-05-11 DIAGNOSIS — R569 Unspecified convulsions: Secondary | ICD-10-CM

## 2019-05-11 DIAGNOSIS — F17210 Nicotine dependence, cigarettes, uncomplicated: Secondary | ICD-10-CM | POA: Diagnosis not present

## 2019-05-11 DIAGNOSIS — L98499 Non-pressure chronic ulcer of skin of other sites with unspecified severity: Secondary | ICD-10-CM | POA: Diagnosis not present

## 2019-05-11 DIAGNOSIS — C3491 Malignant neoplasm of unspecified part of right bronchus or lung: Secondary | ICD-10-CM | POA: Insufficient documentation

## 2019-05-11 DIAGNOSIS — Z794 Long term (current) use of insulin: Secondary | ICD-10-CM | POA: Diagnosis not present

## 2019-05-11 DIAGNOSIS — C3490 Malignant neoplasm of unspecified part of unspecified bronchus or lung: Secondary | ICD-10-CM | POA: Insufficient documentation

## 2019-05-11 DIAGNOSIS — C7931 Secondary malignant neoplasm of brain: Secondary | ICD-10-CM | POA: Diagnosis not present

## 2019-05-11 DIAGNOSIS — F349 Persistent mood [affective] disorder, unspecified: Secondary | ICD-10-CM | POA: Diagnosis not present

## 2019-05-11 DIAGNOSIS — R531 Weakness: Secondary | ICD-10-CM | POA: Insufficient documentation

## 2019-05-11 MED ORDER — IOHEXOL 300 MG/ML  SOLN
75.0000 mL | Freq: Once | INTRAMUSCULAR | Status: AC | PRN
  Administered 2019-05-11: 75 mL via INTRAVENOUS

## 2019-05-11 MED ORDER — SODIUM CHLORIDE (PF) 0.9 % IJ SOLN
INTRAMUSCULAR | Status: AC
  Filled 2019-05-11: qty 50

## 2019-05-11 NOTE — Progress Notes (Signed)
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 time of interpretation on 04/25/2019 at 2:44 pm [facility], who verbally acknowledged these results. Electronically Signed   By: KKellie SimmeringDO   On: 04/25/2019 14:45   Result Date: 04/30/2019 CLINICAL DATA:  Focal neuro deficit, greater than 6 hours, stroke suspected. Additional history obtained from Dr. KLeonel Ramsay Left-sided neglect, history of breast cancer. EXAM: CT ANGIOGRAPHY HEAD AND NECK CT PERFUSION BRAIN TECHNIQUE: Multidetector CT imaging of the head and neck was performed using the standard protocol during bolus administration of intravenous contrast. Multiplanar CT image reconstructions and MIPs were obtained to evaluate the vascular anatomy. Carotid stenosis measurements (when applicable) are obtained utilizing NASCET criteria, using the distal internal carotid diameter as the denominator. Multiphase CT imaging of the brain was performed following IV bolus contrast injection. Subsequent parametric perfusion maps were calculated using RAPID software. CONTRAST:  1048m OMNIPAQUE IOHEXOL 350 MG/ML SOLN COMPARISON:  Concurrently performed noncontrast head CT  04/25/2019, noncontrast head CT 11/17/2018, CT angiogram chest 02/22/2019 FINDINGS: CTA NECK FINDINGS Aortic arch: Standard aortic branching. Mixed plaque within the visualized aortic arch and proximal major branch vessels of the neck. No high-grade innominate or proximal subclavian artery stenosis. Right carotid system: CCA and ICA patent within the neck without measurable stenosis (50% or greater). Mild scattered calcified plaque within the common carotid artery, carotid bifurcation and proximal right ICA. Left carotid system: CCA and ICA patent within the neck. Mild scattered calcified plaque within the common carotid artery. Moderate mixed plaque within the proximal ICA. Exact quantification of stenosis is difficult due to blooming of calcified plaque. Stenosis within the proximal ICA is estimated at 40%. Distal to this, the ICA is patent within the neck without stenosis. Vertebral arteries: Left vertebral artery dominant. The vertebral arteries are patent within the neck bilaterally without stenosis. Skeleton: No acute bony abnormality. Cervical spondylosis with multilevel posterior disc osteophytes, uncovertebral and facet hypertrophy. Other neck: No neck mass or cervical lymphadenopathy. Upper chest: A right upper lobe pulmonary nodule is again seen and again measures approximately 10 mm in size (series 6, image 323). This finding is again suspicious for metastatic disease. Pulmonary emphysema. Partially visualized left chest pacer/AICD leads. Review of the MIP images confirms the above findings CTA HEAD FINDINGS Anterior circulation: The intracranial right internal carotid artery is patent with scattered mixed plaque. Sites of mild stenosis within the pre cavernous and cavernous segment. The intracranial left internal carotid artery is patent with scattered calcified plaque. Mild/moderate stenosis within the cavernous  segment. The M1 middle cerebral arteries are patent without significant stenosis. No M2 proximal branch occlusion or high-grade proximal stenosis is identified. The anterior cerebral arteries are patent bilaterally without high-grade proximal stenosis. No intracranial aneurysm is identified. Posterior circulation: The non dominant intracranial right vertebral artery is patent and appears to terminate predominantly as the right PICA. The intracranial left vertebral artery is patent. Mild calcified plaque within this vessel without significant stenosis. The basilar artery is patent without significant stenosis. Predominantly fetal origin of the right posterior cerebral artery with hypoplastic right P1 segment. The posterior cerebral arteries are patent bilaterally without significant proximal stenosis. A left posterior communicating artery is not definitively identified. Venous sinuses: No pertinent prior studies available for comparison. Anatomic variants: As described. Delayed imaging: Delayed venous phase post-contrast imaging demonstrates a 9 mm cortically based enhancing lesion within the right frontoparietal lobes (series 12, image 25). Subtle adjacent leptomeningeal enhancement is also questioned (for instance as seen on series 12, image 24). Additionally, there is a 4 mm enhancing lesion versus asymmetric vascular enhancement within the left cerebellum (series 12, image 9). Review of the MIP images confirms the above findings CT Brain Perfusion Findings: CBF (<30%) Volume: 55m Perfusion (Tmax>6.0s) volume: 046mMismatch Volume: 34m17mnfarction Location:None identified by the perfusion software. IMPRESSION: CTA neck: 1. Atherosclerotic disease as detailed. 2. The right common and internal carotid arteries are patent within the neck without stenosis. 3. The left common and internal carotid arteries are patent within the neck. Moderate mixed plaque results in an estimated 40% stenosis within the proximal left ICA.  4. The bilateral vertebral arteries are patent within the neck without stenosis. Left vertebral artery dominant. 5. Unchanged 10 mm nodule within the right upper lobe suspicious for metastasis. CTA head: 1. No intracranial large vessel occlusion. 2. Atherosclerotic disease within the intracranial internal carotid arteries. Sites of mild stenosis within the pre cavernous and cavernous right ICA. Sites of mild/moderate stenosis within  the cavernous left ICA. 3. 10 mm enhancing cortical lesion within the right frontoparietal lobe. Subtle adjacent leptomeningeal enhancement is also questioned. An additional 4 mm enhancing lesion is questioned within the left cerebellum versus asymmetric vascular enhancement. 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 Contrast-enhanced brain MRI is recommended for further evaluation, if the patient is able to have one. CT perfusion head: The perfusion software identifies no core infarct. The perfusion software identifies no region of critically hypoperfused brain parenchyma utilizing the Tmax>6 seconds threshold. Electronically Signed: By: Kellie Simmering DO On: 04/25/2019 14:40   DG Pelvis Portable  Result Date: 04/25/2019 CLINICAL DATA:  Recent fall EXAM: PORTABLE PELVIS 1-2 VIEWS COMPARISON:  05/27/17 FINDINGS: Contrast material is noted within the urinary bladder. No acute fracture or dislocation is seen. Degenerative changes the lumbar spine and hip joints are noted. IMPRESSION: No acute abnormality noted. Electronically Signed   By: Inez Catalina M.D.   On: 04/25/2019 16:04   CT C-SPINE NO CHARGE  Result Date: 04/25/2019 CLINICAL DATA:  Trauma. Patient was found down. EXAM: CT CERVICAL SPINE WITHOUT CONTRAST TECHNIQUE: Multidetector CT imaging of the cervical spine was performed without intravenous contrast. Multiplanar CT image reconstructions were also generated. COMPARISON:  Radiographs dated 04/17/2019 and CT scan dated 10/21/2018 FINDINGS: Alignment: Normal.  Skull base and vertebrae: No acute fracture. No primary bone lesion or focal pathologic process. Soft tissues and spinal canal: No prevertebral fluid or swelling. No visible canal hematoma. Disc levels: There is disc space narrowing at C5-6 with a small broad-based disc osteophyte complex with slight left foraminal stenosis. There is disc space narrowing at C6-7. No neural impingement. There is ankylosis of the facet joints bilaterally at C3-4. Severe right facet arthritis at C4-5 without foraminal stenosis. Upper chest: Incompletely visualized irregular faint lesion in the posterior aspect of the right upper lobe as demonstrated on the prior chest CT of 10/21/2018. Other: Aortic atherosclerosis. AICD in place. IMPRESSION: No acute abnormality of the cervical spine. Degenerative disc and joint disease. Aortic Atherosclerosis (ICD10-I70.0). Electronically Signed   By: Lorriane Shire M.D.   On: 04/25/2019 14:35   CT Code Stroke Cerebral Perfusion with contrast  Addendum Date: 04/30/2019   ADDENDUM REPORT: 04/30/2019 17:40 ADDENDUM: While reviewing this case for an upcoming tumor board conference, a focus of asymmetric enhancement is noted within the paramedian right parietooccipital lobe measuring 6 mm (series 12, image 18). This may reflect asymmetric vascular enhancement or an additional small metastatic lesion. These results were called by telephone on 2/21/2021at 5:36 pm to provider Dr. Karie Kirks, who verbally acknowledged these results. Electronically Signed   By: Kellie Simmering DO   On: 04/30/2019 17:40   Addendum Date: 04/25/2019   ADDENDUM REPORT: 04/25/2019 14:45 ADDENDUM: CTA and CT perfusion findings called by telephone at the time of interpretation on 04/25/2019 at 2:44 pm to provider MCNEILL [facility] , who verbally acknowledged these results. Electronically Signed   By: Kellie Simmering DO   On: 04/25/2019 14:45   Result Date: 04/30/2019 CLINICAL DATA:  Focal neuro deficit, greater than 6 hours,  stroke suspected. Additional history obtained from Dr. Leonel Ramsay: Left-sided neglect, history of breast cancer. EXAM: CT ANGIOGRAPHY HEAD AND NECK CT PERFUSION BRAIN TECHNIQUE: Multidetector CT imaging of the head and neck was performed using the standard protocol during bolus administration of intravenous contrast. Multiplanar CT image reconstructions and MIPs were obtained to evaluate the vascular anatomy. Carotid stenosis measurements (when applicable) are obtained utilizing NASCET criteria, using the distal internal carotid diameter  as the denominator. Multiphase CT imaging of the brain was performed following IV bolus contrast injection. Subsequent parametric perfusion maps were calculated using RAPID software. CONTRAST:  159m OMNIPAQUE IOHEXOL 350 MG/ML SOLN COMPARISON:  Concurrently performed noncontrast head CT 04/25/2019, noncontrast head CT 11/17/2018, CT angiogram chest 02/22/2019 FINDINGS: CTA NECK FINDINGS Aortic arch: Standard aortic branching. Mixed plaque within the visualized aortic arch and proximal major branch vessels of the neck. No high-grade innominate or proximal subclavian artery stenosis. Right carotid system: CCA and ICA patent within the neck without measurable stenosis (50% or greater). Mild scattered calcified plaque within the common carotid artery, carotid bifurcation and proximal right ICA. Left carotid system: CCA and ICA patent within the neck. Mild scattered calcified plaque within the common carotid artery. Moderate mixed plaque within the proximal ICA. Exact quantification of stenosis is difficult due to blooming of calcified plaque. Stenosis within the proximal ICA is estimated at 40%. Distal to this, the ICA is patent within the neck without stenosis. Vertebral arteries: Left vertebral artery dominant. The vertebral arteries are patent within the neck bilaterally without stenosis. Skeleton: No acute bony abnormality. Cervical spondylosis with multilevel posterior disc  osteophytes, uncovertebral and facet hypertrophy. Other neck: No neck mass or cervical lymphadenopathy. Upper chest: A right upper lobe pulmonary nodule is again seen and again measures approximately 10 mm in size (series 6, image 323). This finding is again suspicious for metastatic disease. Pulmonary emphysema. Partially visualized left chest pacer/AICD leads. Review of the MIP images confirms the above findings CTA HEAD FINDINGS Anterior circulation: The intracranial right internal carotid artery is patent with scattered mixed plaque. Sites of mild stenosis within the pre cavernous and cavernous segment. The intracranial left internal carotid artery is patent with scattered calcified plaque. Mild/moderate stenosis within the cavernous segment. The M1 middle cerebral arteries are patent without significant stenosis. No M2 proximal branch occlusion or high-grade proximal stenosis is identified. The anterior cerebral arteries are patent bilaterally without high-grade proximal stenosis. No intracranial aneurysm is identified. Posterior circulation: The non dominant intracranial right vertebral artery is patent and appears to terminate predominantly as the right PICA. The intracranial left vertebral artery is patent. Mild calcified plaque within this vessel without significant stenosis. The basilar artery is patent without significant stenosis. Predominantly fetal origin of the right posterior cerebral artery with hypoplastic right P1 segment. The posterior cerebral arteries are patent bilaterally without significant proximal stenosis. A left posterior communicating artery is not definitively identified. Venous sinuses: No pertinent prior studies available for comparison. Anatomic variants: As described. Delayed imaging: Delayed venous phase post-contrast imaging demonstrates a 9 mm cortically based enhancing lesion within the right frontoparietal lobes (series 12, image 25). Subtle adjacent leptomeningeal enhancement  is also questioned (for instance as seen on series 12, image 24). Additionally, there is a 4 mm enhancing lesion versus asymmetric vascular enhancement within the left cerebellum (series 12, image 9). Review of the MIP images confirms the above findings CT Brain Perfusion Findings: CBF (<30%) Volume: 017mPerfusion (Tmax>6.0s) volume: 74m14mismatch Volume: 74mL72mfarction Location:None identified by the perfusion software. IMPRESSION: CTA neck: 1. Atherosclerotic disease as detailed. 2. The right common and internal carotid arteries are patent within the neck without stenosis. 3. The left common and internal carotid arteries are patent within the neck. Moderate mixed plaque results in an estimated 40% stenosis within the proximal left ICA. 4. The bilateral vertebral arteries are patent within the neck without stenosis. Left vertebral artery dominant. 5. Unchanged 10 mm nodule within the  right upper lobe suspicious for metastasis. CTA head: 1. No intracranial large vessel occlusion. 2. Atherosclerotic disease within the intracranial internal carotid arteries. Sites of mild stenosis within the pre cavernous and cavernous right ICA. Sites of mild/moderate stenosis within the cavernous left ICA. 3. 10 mm enhancing cortical lesion within the right frontoparietal lobe. Subtle adjacent leptomeningeal enhancement is also questioned. An additional 4 mm enhancing lesion is questioned within the left cerebellum versus asymmetric vascular enhancement. 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 Contrast-enhanced brain MRI is recommended for further evaluation, if the patient is able to have one. CT perfusion head: The perfusion software identifies no core infarct. The perfusion software identifies no region of critically hypoperfused brain parenchyma utilizing the Tmax>6 seconds threshold. Electronically Signed: By: Kellie Simmering DO On: 04/25/2019 14:40   DG Shoulder Left  Result Date: 04/18/2019 CLINICAL DATA:   Left shoulder pain, lung cancer EXAM: LEFT SHOULDER - 2+ VIEW COMPARISON:  None. FINDINGS: No fracture or dislocation is seen. The joint spaces are preserved. The visualized soft tissues are unremarkable. Visualized lungs are better evaluated on dedicated chest radiographs. IMPRESSION: Negative. Electronically Signed   By: Julian Hy M.D.   On: 04/18/2019 08:41   EEG adult  Result Date: 04/25/2019 Lora Havens, MD     04/26/2019  8:48 AM Patient Name: MEILIN BROSH MRN: [id number] Epilepsy Attending: Lora Havens Referring Physician/Provider: Dr Zane Herald Date: 04/25/2019 Duration: 42.27 mins Patient history: AHLAYAH TARKOWSKI is a 79 y.o. female with a history of adenocarcinoma of the lung who presents after being found down and unresponsive by her daughter today. She also had a left gaze preference per EMS on arrival.  She was not speaking at all on their arrival, began improving on route. EEG to evaluate for status. Level of alertness: lethargic AEDs during EEG study: Ativan Technical aspects: This EEG study was done with scalp electrodes positioned according to the 10-20 International system of electrode placement. Electrical activity was acquired at a sampling rate of '500Hz'$  and reviewed with a high frequency filter of '70Hz'$  and a low frequency filter of '1Hz'$ . EEG data were recorded continuously and digitally stored. DESCRIPTION: No clear posterior dominant rhythm was seen. EEG showed continuous generalized polymorphic high amplitude 5-'6hz'$  theta slowing. IV ativan was administered during the study after which per neurologist Dr Leonel Ramsay, patient clinically appeared improved. EEG continued to show continuous 5-'6hz'$  theta slowing with lower amplitude. Hyperventilation and photic stimulation were not performed. ABNORMALITY - Continuous slow, generalized IMPRESSION: This study is suggestive of moderate diffuse encephalopathy, non specific to etiology. Per bedside neurologist, patient improved  after IV ativan which although not specific does raise suspicion for ictal/post-ictal activity. No definite seizures or epileptiform discharges were seen throughout the recording. Priyanka Barbra Sarks   Overnight EEG with video  Result Date: 04/27/2019 Lora Havens, MD     04/27/2019  2:04 PM Patient Name: SHAKETHA JEON MRN: [id number] Epilepsy Attending: Lora Havens Referring Physician/Provider: Dr Zane Herald Duration:  04/26/2019 1154 to 04/27/2019 1124  Patient history: Remee Charley Smithis a 79 y.o.femalewith a history of adenocarcinoma of the lung who presents after being found down and unresponsive by her daughter today. She also had a left gaze preference per EMS on arrival. She was not speaking at all on their arrival,began improving on route. EEG to evaluate for status.  Level of alertness:  Awake, sleep  AEDs during EEG study:  Keppra  Technical aspects: This EEG study was done with  scalp electrodes positioned according to the 10-20 International system of electrode placement. Electrical activity was acquired at a sampling rate of '500Hz'$  and reviewed with a high frequency filter of '70Hz'$  and a low frequency filter of '1Hz'$ . EEG data were recorded continuously and digitally stored.  DESCRIPTION:  The posterior dominant rhythm consists of 7.5-8 Hz activity of moderate voltage (25-35 uV) seen predominantly in posterior head regions, symmetric and reactive to eye opening and eye closing.      Sleep was characterized by vertex waves, sleep spindles (13 to 15 Hz), maximal frontocentral as well as REM sleep.  EEG showed continuous generalized 5 to 7 Hz theta slowing. Spikes were also seen in the right parieto-occipital region, maximal P4/P8/O2 ABNORMALITY - Continuous slow, generalized - Spike, right parieto-occipital region, maximal P4/P8/O2 IMPRESSION: This study is suggestive of epileptogenicity arising from the right parieto-occipital region as well as mild diffuse encephalopathy, non specific  to etiology. No definite seizures were seen throughout the recording. EEG appears to be improving compared to previous routine study on 04/25/2019. Lora Havens   ECHOCARDIOGRAM COMPLETE  Result Date: 04/26/2019    ECHOCARDIOGRAM REPORT   Patient Name:   RETHEL SEBEK Date of Exam: 04/26/2019 Medical Rec #:  [id number]     Height:       61.0 in Accession #:    [id number]    Weight:       166.2 lb Date of Birth:  1940/06/11      BSA:          1.75 m Patient Age:    28 years      BP:           124/64 mmHg Patient Gender: F             HR:           79 bpm. Exam Location:  Inpatient Procedure: 2D Echo, Color Doppler and Cardiac Doppler Indications:    I48.91* Unspecified atrial fibrillation  History:        Patient has prior history of Echocardiogram examinations, most                 recent 10/23/2018. Pacemaker and Defibrillator, Arrythmias:LBBB;                 Risk Factors:Hypertension, Diabetes and Dyslipidemia.  Sonographer:    Raquel Sarna Senior RDCS Referring Phys: [id number] JENNIFER CHOI  Sonographer Comments: Technically difficult study due to poor echo windows. IMPRESSIONS  1. Technically difficult study. Not all wall segments visualized. EF mildly decreased, with mild hypokinesis of the basal inferoseptum, inferior, and inferolateral walls. Basal anterolateral wall not well seen. When compared to prior study, these walls were better seen on previous study and were within normal limits.  2. Left ventricular ejection fraction, by estimation, is 45 to 50%. The left ventricle has mildly decreased function. The left ventricle demonstrates regional wall motion abnormalities (see scoring diagram/findings for description). There is mild asymmetric left ventricular hypertrophy. Left ventricular diastolic parameters are indeterminate.  3. Right ventricular systolic function was not well visualized. The right ventricular size is not well visualized. There is normal pulmonary artery systolic pressure.  4. The mitral valve  is grossly normal. No evidence of mitral valve regurgitation. No evidence of mitral stenosis.  5. The aortic valve is grossly normal. Aortic valve regurgitation is not visualized. No aortic stenosis is present. FINDINGS  Left Ventricle: Left ventricular ejection fraction, by estimation, is 45 to 50%. The left ventricle has  mildly decreased function. The left ventricle demonstrates regional wall motion abnormalities. The left ventricular internal cavity size was normal in size. There is mild asymmetric left ventricular hypertrophy. Abnormal (paradoxical) septal motion, consistent with RV pacemaker. Left ventricular diastolic parameters are indeterminate.  LV Wall Scoring: The basal inferolateral segment, basal anterolateral segment, and basal inferior segment are hypokinetic. The mid and distal anterior wall, mid and distal lateral wall, entire septum, entire apex, mid and distal inferior wall, and mid anterolateral segment are normal. Right Ventricle: The right ventricular size is not well visualized. Right vetricular wall thickness was not assessed. Right ventricular systolic function was not well visualized. There is normal pulmonary artery systolic pressure. The tricuspid regurgitant velocity is 2.38 m/s, and with an assumed right atrial pressure of 3 mmHg, the estimated right ventricular systolic pressure is 61.9 mmHg. Left Atrium: Left atrial size was normal in size. Right Atrium: Right atrial size was not well visualized. Pericardium: There is no evidence of pericardial effusion. Mitral Valve: The mitral valve is grossly normal. No evidence of mitral valve regurgitation. No evidence of mitral valve stenosis. Tricuspid Valve: The tricuspid valve is not well visualized. Tricuspid valve regurgitation is not demonstrated. No evidence of tricuspid stenosis. Aortic Valve: The aortic valve is grossly normal. Aortic valve regurgitation is not visualized. No aortic stenosis is present. Pulmonic Valve: The pulmonic  valve was not well visualized. Pulmonic valve regurgitation is not visualized. Aorta: The aortic root and ascending aorta are structurally normal, with no evidence of dilitation. IAS/Shunts: The atrial septum is grossly normal. Additional Comments: A pacer wire is visualized.  LEFT VENTRICLE PLAX 2D LVIDd:         4.55 cm  Diastology LVIDs:         3.63 cm  LV e' lateral:   4.46 cm/s LV PW:         0.74 cm  LV E/e' lateral: 13.6 LV IVS:        1.39 cm  LV e' medial:    4.90 cm/s LVOT diam:     1.90 cm  LV E/e' medial:  12.4 LV SV:         34.02 ml LV SV Index:   21.49 LVOT Area:     2.84 cm  LEFT ATRIUM             Index LA diam:        2.30 cm 1.32 cm/m LA Vol (A2C):   50.8 ml 29.10 ml/m LA Vol (A4C):   36.1 ml 20.68 ml/m LA Biplane Vol: 44.1 ml 25.26 ml/m  AORTIC VALVE LVOT Vmax:   66.60 cm/s LVOT Vmean:  46.600 cm/s LVOT VTI:    0.120 m  AORTA Ao Asc diam: 2.80 cm MITRAL VALVE               TRICUSPID VALVE MV Area (PHT): 2.71 cm    TR Peak grad:   22.7 mmHg MV Decel Time: 280 msec    TR Vmax:        238.00 cm/s MV E velocity: 60.80 cm/s MV A velocity: 48.00 cm/s  SHUNTS MV E/A ratio:  1.27        Systemic VTI:  0.12 m                            Systemic Diam: 1.90 cm Buford Dresser MD Electronically signed by Buford Dresser MD Signature Date/Time: 04/26/2019/9:40:19 PM    Final  CUP PACEART REMOTE DEVICE CHECK  Result Date: 04/18/2019 Scheduled remote reviewed.  Normal device function.  Next remote 91 days.  CT HEAD CODE STROKE WO CONTRAST  Result Date: 04/25/2019 CLINICAL DATA:  Code stroke. Possible stroke; cerebral hemorrhage suspected. Additional history obtained from Dr. Leonel Ramsay: Patient presenting with left-sided neglect, history of breast cancer, possible metastases. EXAM: CT HEAD WITHOUT CONTRAST TECHNIQUE: Contiguous axial images were obtained from the base of the skull through the vertex without intravenous contrast. COMPARISON:  Head CT 11/17/2018 FINDINGS: Brain: Mildly  motion degraded examination There is no evidence of acute intracranial hemorrhage or acute demarcated cortical infarction. There is a moderate to large region of hypodensity within the right frontoparietal white matter without definite loss of gray-white differentiation. Mild background ill-defined hypoattenuation within the cerebral white matter is nonspecific, but consistent with chronic small vessel ischemic disease. 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 There is no midline shift or extra-axial fluid collection. Moderate generalized parenchymal atrophy. Vascular: No hyperdense vessel.  Atherosclerotic calcifications. Skull: Normal. Negative for fracture or focal lesion. Sinuses/Orbits: Visualized orbits demonstrate no acute abnormality. No significant paranasal sinus disease or mastoid effusion. These results were called by telephone at the time of interpretation on 04/25/2019 at 2:00 pm to provider Dr. Leonel Ramsay, who verbally acknowledged these results. IMPRESSION: No evidence of acute intracranial hemorrhage. Moderate to large region of hypoattenuation within the right frontoparietal white matter. Findings are suspicious for vasogenic edema secondary to an underlying mass, possibly related to reported history of breast cancer. An acute white matter infarct cannot be excluded. Contrast-enhanced brain MRI is recommended for further evaluation. No definite loss of gray-white differentiation. Background moderate generalized parenchymal atrophy with mild chronic small vessel ischemic disease. Redemonstrated chronic lacunar infarcts within bilateral basal ganglia. Electronically Signed   By: Kellie Simmering DO   On: 04/25/2019 14:08     Assessment/Plan Malignant neoplasm of unspecified part of unspecified bronchus or lung ([place]) - Plan: CT HEAD W & WO CONTRAST, CANCELED: CT HEAD W & WO CONTRAST  Seizure ([place])  Brain metastases [facility])  Ms. Degroote is clinically improved  following hospitalization for first ever seizure.  She has some residual cognitive deficits (mild) and subtle left leg weakness.  Right parietal metastasis is clearly visible and likely culprit for seizure focus.  There is a second region of subtle enhancement more medially which could represent additional metastasis or imaging artifact.  We recommended repeating CT head w/ and w/o contrast for better evaluation, now that edema is likely resolved s/p corticosteroids.  She should decrease decadron to '2mg'$  daily in the meantime.  Keppra should be continued '500mg'$  BID.  Plan will be for tumor board evaluation and consultation with radiation oncology for planned radiosurgery to one or multiple sites.  Patient is agreeable to this plan.  We can follow up with her in 3 months following SRS or sooner if clinically indicated.  We spent twenty additional minutes teaching regarding the natural history, biology, and historical experience in the treatment of neurologic complications of cancer.   We appreciate the opportunity to participate in the care of SHARESA KEMP.   All questions were answered. The patient knows to call the clinic with any problems, questions or concerns. No barriers to learning were detected.  The total time spent in the encounter was 45 minutes and more than 50% was on counseling and review of test results   Ventura Sellers, MD Medical Director of Neuro-Oncology [facility] [place]  [name] 05/11/19 3:32 PM

## 2019-05-15 ENCOUNTER — Telehealth: Payer: Self-pay | Admitting: Radiation Oncology

## 2019-05-15 ENCOUNTER — Inpatient Hospital Stay: Payer: Medicare Other

## 2019-05-15 DIAGNOSIS — E1165 Type 2 diabetes mellitus with hyperglycemia: Secondary | ICD-10-CM | POA: Diagnosis not present

## 2019-05-15 DIAGNOSIS — C719 Malignant neoplasm of brain, unspecified: Secondary | ICD-10-CM | POA: Diagnosis not present

## 2019-05-15 NOTE — Telephone Encounter (Signed)
The patient is currently in SNF. In conference it was recommended that she proceed with single fraction SRS. Her treatment planning is limited by the lack of MRI. Clinically she continues to improve per Dr. Mickeal Skinner in our discussion this am in brain and spine conference. She remains on Dexamethasone at 2 mg daily and on Keppra given her presentation of seizure and probable status epilepticus. We had to leave a message though letting her daughter and son in law know we were trying to reach them to further consider and plan for radiotherapy treatment.

## 2019-05-16 ENCOUNTER — Telehealth: Payer: Self-pay | Admitting: Radiation Oncology

## 2019-05-16 DIAGNOSIS — C3491 Malignant neoplasm of unspecified part of right bronchus or lung: Secondary | ICD-10-CM

## 2019-05-16 DIAGNOSIS — C7931 Secondary malignant neoplasm of brain: Secondary | ICD-10-CM

## 2019-05-16 NOTE — Progress Notes (Signed)
No ICM remote transmission received for 05/08/2019 and next ICM transmission scheduled for 06/06/2019.

## 2019-05-16 NOTE — Progress Notes (Signed)
Has armband been applied?  Yes  Does patient have an allergy to IV contrast dye?: No   Has patient ever received premedication for IV contrast dye?: n/a  Does patient take metformin?: No  If patient does take metformin when was the last dose: n/a  Date of lab work: 05/01/2019 BUN: 15 CR: 0.75 EGfr: >60  IV site: Left Forearm  Has IV site been added to flowsheet?    BP (!) 111/42 (BP Location: Left Arm, Patient Position: Sitting)   Pulse 70   Temp (!) 97.1 F (36.2 C)   Resp 18   Ht 5' 1" (1.549 m)   Wt 165 lb 12.8 oz (75.2 kg)   SpO2 99%   BMI 31.33 kg/m    Wt Readings from Last 3 Encounters:  05/17/19 165 lb 12.8 oz (75.2 kg)  05/11/19 169 lb (76.7 kg)  04/25/19 166 lb 3.6 oz (75.4 kg)

## 2019-05-16 NOTE — Telephone Encounter (Signed)
LM for pt's daughter about SRS. I couldn't reach her but was able to reach her son in law Juanda Crumble, her other designated Air traffic controller. We discussed the conversation in brain oncology conference yesterday morning and Dr. Lisbeth Renshaw has recommended SRS to the single 9 mm metastatic deposit in the right parietal lobe seen on repeat CT with contrast on 05/11/19. We discussed the risks, benefits, short, and long term effects of radiotherapy, and the patient's son in law is interested in proceeding. I discussed the delivery and logistics of radiotherapy and anticipates a course of 1 fraction. Written consent from the patient and if there is concern for her ability to consent herself, her son in law has given verbal permission to proceed. We will coordinate with her facility and make sure her medications are compatible with IV contrast. Her labs will be repeated as well prior to simulation given her single kidney.

## 2019-05-17 ENCOUNTER — Encounter: Payer: Self-pay | Admitting: Radiation Oncology

## 2019-05-17 ENCOUNTER — Ambulatory Visit
Admission: RE | Admit: 2019-05-17 | Discharge: 2019-05-17 | Disposition: A | Discharge status: Home / Self Care | Payer: Medicare Other | Source: Ambulatory Visit | Attending: Radiation Oncology | Admitting: Radiation Oncology

## 2019-05-17 ENCOUNTER — Other Ambulatory Visit: Payer: Self-pay

## 2019-05-17 VITALS — BP 111/42 | HR 70 | Temp 97.1°F | Resp 18 | Ht 61.0 in | Wt 165.8 lb

## 2019-05-17 DIAGNOSIS — C7931 Secondary malignant neoplasm of brain: Secondary | ICD-10-CM | POA: Diagnosis not present

## 2019-05-17 DIAGNOSIS — Z95810 Presence of automatic (implantable) cardiac defibrillator: Secondary | ICD-10-CM | POA: Diagnosis not present

## 2019-05-17 DIAGNOSIS — C3491 Malignant neoplasm of unspecified part of right bronchus or lung: Secondary | ICD-10-CM | POA: Diagnosis not present

## 2019-05-17 DIAGNOSIS — E1151 Type 2 diabetes mellitus with diabetic peripheral angiopathy without gangrene: Secondary | ICD-10-CM | POA: Diagnosis not present

## 2019-05-17 DIAGNOSIS — C3431 Malignant neoplasm of lower lobe, right bronchus or lung: Secondary | ICD-10-CM | POA: Diagnosis not present

## 2019-05-17 DIAGNOSIS — C342 Malignant neoplasm of middle lobe, bronchus or lung: Secondary | ICD-10-CM | POA: Diagnosis not present

## 2019-05-17 DIAGNOSIS — C3481 Malignant neoplasm of overlapping sites of right bronchus and lung: Secondary | ICD-10-CM

## 2019-05-17 DIAGNOSIS — J449 Chronic obstructive pulmonary disease, unspecified: Secondary | ICD-10-CM | POA: Diagnosis not present

## 2019-05-17 DIAGNOSIS — C719 Malignant neoplasm of brain, unspecified: Secondary | ICD-10-CM | POA: Diagnosis not present

## 2019-05-17 DIAGNOSIS — Z923 Personal history of irradiation: Secondary | ICD-10-CM | POA: Diagnosis not present

## 2019-05-17 MED ORDER — SODIUM CHLORIDE 0.9% FLUSH
10.0000 mL | Freq: Once | INTRAVENOUS | Status: AC
  Administered 2019-05-17: 10 mL via INTRAVENOUS
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
## 2019-05-17 NOTE — Addendum Note (Signed)
Encounter addended by: Hayden Pedro, PA-C on: 05/17/2019 11:12 AM  Actions taken: Clinical Note Signed

## 2019-05-19 ENCOUNTER — Other Ambulatory Visit: Payer: Self-pay | Admitting: Radiation Therapy

## 2019-05-19 DIAGNOSIS — C7931 Secondary malignant neoplasm of brain: Secondary | ICD-10-CM

## 2019-05-21 ENCOUNTER — Inpatient Hospital Stay (HOSPITAL_COMMUNITY)
Admission: EM | Admit: 2019-05-21 | Discharge: 2019-05-23 | DRG: 638 | Disposition: A | Discharge status: Home Health | Payer: Medicare Other | Attending: Internal Medicine | Admitting: Internal Medicine

## 2019-05-21 DIAGNOSIS — E1151 Type 2 diabetes mellitus with diabetic peripheral angiopathy without gangrene: Secondary | ICD-10-CM | POA: Diagnosis not present

## 2019-05-21 DIAGNOSIS — Z743 Need for continuous supervision: Secondary | ICD-10-CM | POA: Diagnosis not present

## 2019-05-21 DIAGNOSIS — I447 Left bundle-branch block, unspecified: Secondary | ICD-10-CM | POA: Diagnosis not present

## 2019-05-21 DIAGNOSIS — K589 Irritable bowel syndrome without diarrhea: Secondary | ICD-10-CM | POA: Diagnosis present

## 2019-05-21 DIAGNOSIS — Z794 Long term (current) use of insulin: Secondary | ICD-10-CM

## 2019-05-21 DIAGNOSIS — I5032 Chronic diastolic (congestive) heart failure: Secondary | ICD-10-CM | POA: Diagnosis present

## 2019-05-21 DIAGNOSIS — Z66 Do not resuscitate: Secondary | ICD-10-CM | POA: Diagnosis not present

## 2019-05-21 DIAGNOSIS — Z20822 Contact with and (suspected) exposure to covid-19: Secondary | ICD-10-CM | POA: Diagnosis present

## 2019-05-21 DIAGNOSIS — R739 Hyperglycemia, unspecified: Secondary | ICD-10-CM | POA: Diagnosis present

## 2019-05-21 DIAGNOSIS — Z7982 Long term (current) use of aspirin: Secondary | ICD-10-CM

## 2019-05-21 DIAGNOSIS — R4182 Altered mental status, unspecified: Secondary | ICD-10-CM

## 2019-05-21 DIAGNOSIS — M47812 Spondylosis without myelopathy or radiculopathy, cervical region: Secondary | ICD-10-CM | POA: Diagnosis not present

## 2019-05-21 DIAGNOSIS — C7931 Secondary malignant neoplasm of brain: Secondary | ICD-10-CM | POA: Diagnosis present

## 2019-05-21 DIAGNOSIS — I428 Other cardiomyopathies: Secondary | ICD-10-CM | POA: Diagnosis not present

## 2019-05-21 DIAGNOSIS — F17210 Nicotine dependence, cigarettes, uncomplicated: Secondary | ICD-10-CM | POA: Diagnosis present

## 2019-05-21 DIAGNOSIS — R918 Other nonspecific abnormal finding of lung field: Secondary | ICD-10-CM

## 2019-05-21 DIAGNOSIS — E1165 Type 2 diabetes mellitus with hyperglycemia: Principal | ICD-10-CM | POA: Diagnosis present

## 2019-05-21 DIAGNOSIS — Z95810 Presence of automatic (implantable) cardiac defibrillator: Secondary | ICD-10-CM

## 2019-05-21 DIAGNOSIS — I10 Essential (primary) hypertension: Secondary | ICD-10-CM | POA: Diagnosis not present

## 2019-05-21 DIAGNOSIS — E860 Dehydration: Secondary | ICD-10-CM | POA: Diagnosis present

## 2019-05-21 DIAGNOSIS — E039 Hypothyroidism, unspecified: Secondary | ICD-10-CM | POA: Diagnosis not present

## 2019-05-21 DIAGNOSIS — Z882 Allergy status to sulfonamides status: Secondary | ICD-10-CM

## 2019-05-21 DIAGNOSIS — K219 Gastro-esophageal reflux disease without esophagitis: Secondary | ICD-10-CM | POA: Diagnosis present

## 2019-05-21 DIAGNOSIS — N179 Acute kidney failure, unspecified: Secondary | ICD-10-CM | POA: Diagnosis present

## 2019-05-21 DIAGNOSIS — Z885 Allergy status to narcotic agent status: Secondary | ICD-10-CM

## 2019-05-21 DIAGNOSIS — C3491 Malignant neoplasm of unspecified part of right bronchus or lung: Secondary | ICD-10-CM | POA: Diagnosis present

## 2019-05-21 DIAGNOSIS — E1100 Type 2 diabetes mellitus with hyperosmolarity without nonketotic hyperglycemic-hyperosmolar coma (NKHHC): Secondary | ICD-10-CM | POA: Diagnosis not present

## 2019-05-21 DIAGNOSIS — Z91048 Other nonmedicinal substance allergy status: Secondary | ICD-10-CM

## 2019-05-21 DIAGNOSIS — Z881 Allergy status to other antibiotic agents status: Secondary | ICD-10-CM

## 2019-05-21 DIAGNOSIS — Z833 Family history of diabetes mellitus: Secondary | ICD-10-CM | POA: Diagnosis not present

## 2019-05-21 DIAGNOSIS — I519 Heart disease, unspecified: Secondary | ICD-10-CM | POA: Diagnosis present

## 2019-05-21 DIAGNOSIS — Z79899 Other long term (current) drug therapy: Secondary | ICD-10-CM

## 2019-05-21 DIAGNOSIS — E785 Hyperlipidemia, unspecified: Secondary | ICD-10-CM | POA: Diagnosis present

## 2019-05-21 DIAGNOSIS — E875 Hyperkalemia: Secondary | ICD-10-CM | POA: Diagnosis present

## 2019-05-21 DIAGNOSIS — M1990 Unspecified osteoarthritis, unspecified site: Secondary | ICD-10-CM | POA: Diagnosis not present

## 2019-05-21 DIAGNOSIS — C3490 Malignant neoplasm of unspecified part of unspecified bronchus or lung: Secondary | ICD-10-CM | POA: Diagnosis not present

## 2019-05-21 DIAGNOSIS — Z888 Allergy status to other drugs, medicaments and biological substances status: Secondary | ICD-10-CM

## 2019-05-21 DIAGNOSIS — Z79890 Hormone replacement therapy (postmenopausal): Secondary | ICD-10-CM

## 2019-05-21 DIAGNOSIS — I110 Hypertensive heart disease with heart failure: Secondary | ICD-10-CM | POA: Diagnosis not present

## 2019-05-21 DIAGNOSIS — J449 Chronic obstructive pulmonary disease, unspecified: Secondary | ICD-10-CM | POA: Diagnosis present

## 2019-05-21 DIAGNOSIS — J9811 Atelectasis: Secondary | ICD-10-CM | POA: Diagnosis not present

## 2019-05-21 DIAGNOSIS — F419 Anxiety disorder, unspecified: Secondary | ICD-10-CM | POA: Diagnosis present

## 2019-05-21 DIAGNOSIS — Z8042 Family history of malignant neoplasm of prostate: Secondary | ICD-10-CM

## 2019-05-21 LAB — BASIC METABOLIC PANEL
Anion gap: 12 (ref 5–15)
BUN: 22 mg/dL (ref 8–23)
CO2: 25 mmol/L (ref 22–32)
Calcium: 9 mg/dL (ref 8.9–10.3)
Chloride: 86 mmol/L — ABNORMAL LOW (ref 98–111)
Creatinine, Ser: 1.14 mg/dL — ABNORMAL HIGH (ref 0.44–1.00)
GFR calc Af Amer: 54 mL/min — ABNORMAL LOW (ref >60)
GFR calc non Af Amer: 46 mL/min — ABNORMAL LOW (ref >60)
Glucose, Bld: 750 mg/dL (ref 70–99)
Potassium: 6.2 mmol/L — ABNORMAL HIGH (ref 3.5–5.1)
Sodium: 123 mmol/L — ABNORMAL LOW (ref 135–145)

## 2019-05-21 LAB — CBG MONITORING, ED: Glucose-Capillary: >600 mg/dL (ref 70–99)

## 2019-05-21 LAB — CBC
HCT: 38.1 % (ref 36.0–46.0)
Hemoglobin: 12.4 g/dL (ref 12.0–15.0)
MCH: 28.8 pg (ref 26.0–34.0)
MCHC: 32.5 g/dL (ref 30.0–36.0)
MCV: 88.4 fL (ref 80.0–100.0)
Platelets: 338 10*3/uL (ref 150–400)
RBC: 4.31 MIL/uL (ref 3.87–5.11)
RDW: 13.9 % (ref 11.5–15.5)
WBC: 11 10*3/uL — ABNORMAL HIGH (ref 4.0–10.5)
nRBC: 0 % (ref 0.0–0.2)

## 2019-05-21 MED ORDER — SODIUM CHLORIDE 0.9 % IV BOLUS (SEPSIS)
1000.0000 mL | Freq: Once | INTRAVENOUS | Status: AC
  Administered 2019-05-22: 1000 mL via INTRAVENOUS

## 2019-05-21 MED ORDER — SODIUM CHLORIDE 0.9 % IV SOLN
1000.0000 mL | INTRAVENOUS | Status: DC
  Administered 2019-05-22: 1000 mL via INTRAVENOUS

## 2019-05-21 NOTE — ED Triage Notes (Signed)
Pt arrives via [facility] from home w/ c/o hyperglycemia. Per EMS, patient's family checked her GBG and it read "Hi".

## 2019-05-21 NOTE — ED Notes (Signed)
Raynette daughter [phone] looking for an update

## 2019-05-21 NOTE — ED Notes (Signed)
Pt ambulatory to the bathroom w/ standby assistance.
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
## 2019-05-22 ENCOUNTER — Telehealth: Payer: Self-pay | Admitting: *Deleted

## 2019-05-22 ENCOUNTER — Emergency Department (HOSPITAL_COMMUNITY): Payer: Medicare Other

## 2019-05-22 DIAGNOSIS — I428 Other cardiomyopathies: Secondary | ICD-10-CM | POA: Diagnosis present

## 2019-05-22 DIAGNOSIS — I447 Left bundle-branch block, unspecified: Secondary | ICD-10-CM | POA: Diagnosis present

## 2019-05-22 DIAGNOSIS — I5032 Chronic diastolic (congestive) heart failure: Secondary | ICD-10-CM | POA: Diagnosis present

## 2019-05-22 DIAGNOSIS — E875 Hyperkalemia: Secondary | ICD-10-CM

## 2019-05-22 DIAGNOSIS — R739 Hyperglycemia, unspecified: Secondary | ICD-10-CM | POA: Diagnosis not present

## 2019-05-22 DIAGNOSIS — C7931 Secondary malignant neoplasm of brain: Secondary | ICD-10-CM | POA: Diagnosis not present

## 2019-05-22 DIAGNOSIS — E1151 Type 2 diabetes mellitus with diabetic peripheral angiopathy without gangrene: Secondary | ICD-10-CM | POA: Diagnosis present

## 2019-05-22 DIAGNOSIS — C342 Malignant neoplasm of middle lobe, bronchus or lung: Secondary | ICD-10-CM | POA: Diagnosis not present

## 2019-05-22 DIAGNOSIS — N179 Acute kidney failure, unspecified: Secondary | ICD-10-CM | POA: Diagnosis not present

## 2019-05-22 DIAGNOSIS — E785 Hyperlipidemia, unspecified: Secondary | ICD-10-CM | POA: Diagnosis present

## 2019-05-22 DIAGNOSIS — C3490 Malignant neoplasm of unspecified part of unspecified bronchus or lung: Secondary | ICD-10-CM | POA: Diagnosis not present

## 2019-05-22 DIAGNOSIS — M1990 Unspecified osteoarthritis, unspecified site: Secondary | ICD-10-CM | POA: Diagnosis present

## 2019-05-22 DIAGNOSIS — E1100 Type 2 diabetes mellitus with hyperosmolarity without nonketotic hyperglycemic-hyperosmolar coma (NKHHC): Secondary | ICD-10-CM | POA: Diagnosis not present

## 2019-05-22 DIAGNOSIS — J449 Chronic obstructive pulmonary disease, unspecified: Secondary | ICD-10-CM | POA: Diagnosis present

## 2019-05-22 DIAGNOSIS — Z833 Family history of diabetes mellitus: Secondary | ICD-10-CM | POA: Diagnosis not present

## 2019-05-22 DIAGNOSIS — M47812 Spondylosis without myelopathy or radiculopathy, cervical region: Secondary | ICD-10-CM | POA: Diagnosis present

## 2019-05-22 DIAGNOSIS — I110 Hypertensive heart disease with heart failure: Secondary | ICD-10-CM | POA: Diagnosis present

## 2019-05-22 DIAGNOSIS — E039 Hypothyroidism, unspecified: Secondary | ICD-10-CM | POA: Diagnosis present

## 2019-05-22 DIAGNOSIS — Z66 Do not resuscitate: Secondary | ICD-10-CM | POA: Diagnosis present

## 2019-05-22 DIAGNOSIS — J9811 Atelectasis: Secondary | ICD-10-CM | POA: Diagnosis not present

## 2019-05-22 DIAGNOSIS — I519 Heart disease, unspecified: Secondary | ICD-10-CM | POA: Diagnosis not present

## 2019-05-22 DIAGNOSIS — C3491 Malignant neoplasm of unspecified part of right bronchus or lung: Secondary | ICD-10-CM | POA: Diagnosis present

## 2019-05-22 DIAGNOSIS — Z95810 Presence of automatic (implantable) cardiac defibrillator: Secondary | ICD-10-CM | POA: Diagnosis not present

## 2019-05-22 DIAGNOSIS — Z8042 Family history of malignant neoplasm of prostate: Secondary | ICD-10-CM | POA: Diagnosis not present

## 2019-05-22 DIAGNOSIS — K589 Irritable bowel syndrome without diarrhea: Secondary | ICD-10-CM | POA: Diagnosis present

## 2019-05-22 DIAGNOSIS — Z20822 Contact with and (suspected) exposure to covid-19: Secondary | ICD-10-CM | POA: Diagnosis present

## 2019-05-22 DIAGNOSIS — E1165 Type 2 diabetes mellitus with hyperglycemia: Secondary | ICD-10-CM | POA: Diagnosis present

## 2019-05-22 DIAGNOSIS — I10 Essential (primary) hypertension: Secondary | ICD-10-CM | POA: Diagnosis not present

## 2019-05-22 DIAGNOSIS — K219 Gastro-esophageal reflux disease without esophagitis: Secondary | ICD-10-CM | POA: Diagnosis present

## 2019-05-22 DIAGNOSIS — Z7982 Long term (current) use of aspirin: Secondary | ICD-10-CM | POA: Diagnosis not present

## 2019-05-22 DIAGNOSIS — F17210 Nicotine dependence, cigarettes, uncomplicated: Secondary | ICD-10-CM | POA: Diagnosis present

## 2019-05-22 DIAGNOSIS — F419 Anxiety disorder, unspecified: Secondary | ICD-10-CM | POA: Diagnosis present

## 2019-05-22 LAB — BASIC METABOLIC PANEL
Anion gap: 11 (ref 5–15)
Anion gap: 14 (ref 5–15)
BUN: 21 mg/dL (ref 8–23)
BUN: 20 mg/dL (ref 8–23)
CO2: 25 mmol/L (ref 22–32)
CO2: 22 mmol/L (ref 22–32)
Calcium: 8.5 mg/dL — ABNORMAL LOW (ref 8.9–10.3)
Calcium: 8.2 mg/dL — ABNORMAL LOW (ref 8.9–10.3)
Chloride: 94 mmol/L — ABNORMAL LOW (ref 98–111)
Chloride: 97 mmol/L — ABNORMAL LOW (ref 98–111)
Creatinine, Ser: 0.9 mg/dL (ref 0.44–1.00)
Creatinine, Ser: 0.9 mg/dL (ref 0.44–1.00)
GFR calc Af Amer: >60 mL/min (ref >60)
GFR calc Af Amer: >60 mL/min (ref >60)
GFR calc non Af Amer: >60 mL/min (ref >60)
GFR calc non Af Amer: >60 mL/min (ref >60)
Glucose, Bld: 514 mg/dL (ref 70–99)
Glucose, Bld: 361 mg/dL — ABNORMAL HIGH (ref 70–99)
Potassium: 4.7 mmol/L (ref 3.5–5.1)
Potassium: 3.9 mmol/L (ref 3.5–5.1)
Sodium: 130 mmol/L — ABNORMAL LOW (ref 135–145)
Sodium: 133 mmol/L — ABNORMAL LOW (ref 135–145)

## 2019-05-22 LAB — CBG MONITORING, ED
Glucose-Capillary: 496 mg/dL — ABNORMAL HIGH (ref 70–99)
Glucose-Capillary: 506 mg/dL (ref 70–99)
Glucose-Capillary: 397 mg/dL — ABNORMAL HIGH (ref 70–99)
Glucose-Capillary: 399 mg/dL — ABNORMAL HIGH (ref 70–99)
Glucose-Capillary: 294 mg/dL — ABNORMAL HIGH (ref 70–99)
Glucose-Capillary: 373 mg/dL — ABNORMAL HIGH (ref 70–99)
Glucose-Capillary: 245 mg/dL — ABNORMAL HIGH (ref 70–99)
Glucose-Capillary: 206 mg/dL — ABNORMAL HIGH (ref 70–99)
Glucose-Capillary: 126 mg/dL — ABNORMAL HIGH (ref 70–99)
Glucose-Capillary: 161 mg/dL — ABNORMAL HIGH (ref 70–99)
Glucose-Capillary: 133 mg/dL — ABNORMAL HIGH (ref 70–99)
Glucose-Capillary: 136 mg/dL — ABNORMAL HIGH (ref 70–99)
Glucose-Capillary: 176 mg/dL — ABNORMAL HIGH (ref 70–99)
Glucose-Capillary: 277 mg/dL — ABNORMAL HIGH (ref 70–99)
Glucose-Capillary: 282 mg/dL — ABNORMAL HIGH (ref 70–99)
Glucose-Capillary: 142 mg/dL — ABNORMAL HIGH (ref 70–99)
Glucose-Capillary: 241 mg/dL — ABNORMAL HIGH (ref 70–99)
Glucose-Capillary: 137 mg/dL — ABNORMAL HIGH (ref 70–99)

## 2019-05-22 LAB — URINALYSIS, ROUTINE W REFLEX MICROSCOPIC
Bilirubin Urine: NEGATIVE
Glucose, UA: >=500 mg/dL — AB
Ketones, ur: NEGATIVE mg/dL
Leukocytes,Ua: NEGATIVE
Nitrite: NEGATIVE
Protein, ur: NEGATIVE mg/dL
Specific Gravity, Urine: <1.005 — ABNORMAL LOW (ref 1.005–1.030)
pH: 5.5 (ref 5.0–8.0)

## 2019-05-22 LAB — POCT I-STAT EG7
Acid-Base Excess: 4 mmol/L — ABNORMAL HIGH (ref 0.0–2.0)
Bicarbonate: 28.3 mmol/L — ABNORMAL HIGH (ref 20.0–28.0)
Calcium, Ion: 1.09 mmol/L — ABNORMAL LOW (ref 1.15–1.40)
HCT: 36 % (ref 36.0–46.0)
Hemoglobin: 12.2 g/dL (ref 12.0–15.0)
O2 Saturation: 100 %
Potassium: 4.8 mmol/L (ref 3.5–5.1)
Sodium: 127 mmol/L — ABNORMAL LOW (ref 135–145)
TCO2: 30 mmol/L (ref 22–32)
pCO2, Ven: 39.6 mmHg — ABNORMAL LOW (ref 44.0–60.0)
pH, Ven: 7.462 — ABNORMAL HIGH (ref 7.250–7.430)
pO2, Ven: 184 mmHg — ABNORMAL HIGH (ref 32.0–45.0)

## 2019-05-22 LAB — OSMOLALITY: Osmolality: 305 mOsm/kg — ABNORMAL HIGH (ref 275–295)

## 2019-05-22 LAB — BETA-HYDROXYBUTYRIC ACID: Beta-Hydroxybutyric Acid: 0.15 mmol/L (ref 0.05–0.27)

## 2019-05-22 LAB — URINALYSIS, MICROSCOPIC (REFLEX): RBC / HPF: NONE SEEN RBC/hpf (ref 0–5)

## 2019-05-22 LAB — HEMOGLOBIN A1C
Hgb A1c MFr Bld: 9.6 % — ABNORMAL HIGH (ref 4.8–5.6)
Mean Plasma Glucose: 228.82 mg/dL

## 2019-05-22 LAB — SARS CORONAVIRUS 2 (TAT 6-24 HRS): SARS Coronavirus 2: NEGATIVE

## 2019-05-22 LAB — GLUCOSE, CAPILLARY
Glucose-Capillary: 241 mg/dL — ABNORMAL HIGH (ref 70–99)
Glucose-Capillary: 382 mg/dL — ABNORMAL HIGH (ref 70–99)

## 2019-05-22 MED ORDER — INSULIN GLARGINE 100 UNIT/ML ~~LOC~~ SOLN
15.0000 [IU] | Freq: Every day | SUBCUTANEOUS | Status: DC
  Administered 2019-05-22: 15 [IU] via SUBCUTANEOUS
  Filled 2019-05-22 (×3): qty 0.15

## 2019-05-22 MED ORDER — METOPROLOL TARTRATE 25 MG PO TABS
25.0000 mg | ORAL_TABLET | Freq: Two times a day (BID) | ORAL | Status: DC
  Administered 2019-05-22 – 2019-05-23 (×3): 25 mg via ORAL
  Filled 2019-05-22 (×3): qty 1

## 2019-05-22 MED ORDER — DEXTROSE 50 % IV SOLN: 0.0000 mL | INTRAVENOUS | Status: DC | PRN

## 2019-05-22 MED ORDER — INSULIN ASPART 100 UNIT/ML ~~LOC~~ SOLN
0.0000 [IU] | Freq: Three times a day (TID) | SUBCUTANEOUS | Status: DC
  Administered 2019-05-22: 2 [IU] via SUBCUTANEOUS
  Administered 2019-05-22 – 2019-05-23 (×2): 5 [IU] via SUBCUTANEOUS

## 2019-05-22 MED ORDER — INSULIN GLARGINE 100 UNIT/ML ~~LOC~~ SOLN
15.0000 [IU] | Freq: Every day | SUBCUTANEOUS | Status: DC
  Filled 2019-05-22: qty 0.15

## 2019-05-22 MED ORDER — CALCIUM GLUCONATE-NACL 1-0.675 GM/50ML-% IV SOLN: 1.0000 g | Freq: Once | INTRAVENOUS | Status: DC

## 2019-05-22 MED ORDER — INSULIN REGULAR(HUMAN) IN NACL 100-0.9 UT/100ML-% IV SOLN
INTRAVENOUS | Status: DC
  Administered 2019-05-22: 11 [IU]/h via INTRAVENOUS
  Filled 2019-05-22: qty 100

## 2019-05-22 MED ORDER — SODIUM CHLORIDE 0.9 % IV SOLN: INTRAVENOUS | Status: DC

## 2019-05-22 MED ORDER — TRAMADOL HCL 50 MG PO TABS
50.0000 mg | ORAL_TABLET | Freq: Three times a day (TID) | ORAL | Status: DC | PRN
  Administered 2019-05-22: 50 mg via ORAL
  Filled 2019-05-22: qty 1

## 2019-05-22 MED ORDER — DEXTROSE-NACL 5-0.45 % IV SOLN: INTRAVENOUS | Status: DC

## 2019-05-22 MED ORDER — ALPRAZOLAM 0.5 MG PO TABS: 0.5000 mg | ORAL_TABLET | Freq: Four times a day (QID) | ORAL | Status: DC | PRN

## 2019-05-22 MED ORDER — INSULIN ASPART 100 UNIT/ML ~~LOC~~ SOLN
0.0000 [IU] | Freq: Every day | SUBCUTANEOUS | Status: DC
  Administered 2019-05-22: 5 [IU] via SUBCUTANEOUS

## 2019-05-22 MED ORDER — GABAPENTIN 300 MG PO CAPS
300.0000 mg | ORAL_CAPSULE | Freq: Three times a day (TID) | ORAL | Status: DC
  Administered 2019-05-22 – 2019-05-23 (×4): 300 mg via ORAL
  Filled 2019-05-22: qty 3
  Filled 2019-05-22: qty 1
  Filled 2019-05-22 (×2): qty 3

## 2019-05-22 MED ORDER — LEVETIRACETAM 500 MG PO TABS
1000.0000 mg | ORAL_TABLET | Freq: Two times a day (BID) | ORAL | Status: DC
  Administered 2019-05-22 – 2019-05-23 (×3): 1000 mg via ORAL
  Filled 2019-05-22 (×3): qty 2

## 2019-05-22 MED ORDER — ACETAMINOPHEN 650 MG RE SUPP: 650.0000 mg | Freq: Four times a day (QID) | RECTAL | Status: DC | PRN

## 2019-05-22 MED ORDER — ENOXAPARIN SODIUM 40 MG/0.4ML ~~LOC~~ SOLN
40.0000 mg | Freq: Every day | SUBCUTANEOUS | Status: DC
  Administered 2019-05-22 – 2019-05-23 (×2): 40 mg via SUBCUTANEOUS
  Filled 2019-05-22 (×2): qty 0.4

## 2019-05-22 MED ORDER — ACETAMINOPHEN 325 MG PO TABS
650.0000 mg | ORAL_TABLET | Freq: Four times a day (QID) | ORAL | Status: DC | PRN
  Administered 2019-05-22: 650 mg via ORAL
  Filled 2019-05-22: qty 2

## 2019-05-22 NOTE — ED Notes (Signed)
Attempted to call relative back but no one answered phone

## 2019-05-22 NOTE — ED Notes (Signed)
Lantus never rec'd in ED from pharmacy. Called pharmacy to check status of medication, pharmacist to check now and have it sent ASAP.

## 2019-05-22 NOTE — ED Notes (Signed)
Pt's CBG result was 136. Informed April - RN.

## 2019-05-22 NOTE — Telephone Encounter (Signed)
Merlin ICD alert received for VF zone episode on 04/25/19 treated with ATP x1, EGM shows AF w/RVR. Occurred while patient hospitalized (04/25/19-05/02/19), no OAC due to brain metastases per discharge summary. 8.8% AT/AF burden since 01/10/19, longest episode 4 days 3 hrs. 92% BiVP. 1 SVT episode appears AF w/RVR. 32 NSVT episodes, available EGMs appear atrially-driven. Routed to Dr. Rayann Heman for review and recommendations. Next f/u due 01/2020. Pt readmitted at [facility] as of 05/21/19.

## 2019-05-22 NOTE — ED Notes (Signed)
Per Dr. Marlowe Sax CBG are q-60 minutes. She also advised to cancel 5%-0.45 Dextrose because the pt was not in DKA and  It does not to be administered once the endo tool goal is m et related to same.

## 2019-05-22 NOTE — ED Notes (Signed)
Pt's CBG result was 241. Informed April - RN.

## 2019-05-22 NOTE — ED Notes (Signed)
Spoke to Dr. Marlowe Sax to confirm orders for Pt. I recorded notes in ref to the conversation. D-5 1/2 NS was NOT to be given. I inquired about other fluids and she told me just to run the insulin and check CBG Q-30 min. I was instructed to page the doctors once levels went below 200 for further instructions in ref. to stopping the infusion, diet and lantus. This information was passed to my relief as well.
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
## 2019-05-22 NOTE — ED Notes (Signed)
Pt's CBG result was 176. Informed April - RN.

## 2019-05-22 NOTE — ED Notes (Signed)
Confirmed with PA and MD that Endo was originally ordered. After rechecking latest CBG levels new orders were given to continue the bolus for 30 min and recheck insulin levels and report back.

## 2019-05-22 NOTE — ED Notes (Signed)
Pt's CBG result 161. Informed April - RN.

## 2019-05-22 NOTE — Progress Notes (Signed)
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 Pt from home Lantus dose decreased to 5 units. Pt with glucose 750 on admission. Currently transitioning to Lantus 15 units and moderate scale. Watch trends for now.  Thanks,  Tama Headings RN, MSN, BC-ADM Inpatient Diabetes Coordinator Team Pager [phone] (8a-5p)

## 2019-05-22 NOTE — ED Notes (Signed)
Ian Malkin, grand-daughter, [phone] looking for an update

## 2019-05-22 NOTE — ED Notes (Signed)
Pt given meal tray.

## 2019-05-22 NOTE — ED Notes (Signed)
Pt gave permission for RN to give watch to daughter, Raynette. Gold colored watch with square face, engraving on back. Daughter gave RN pt's cell Pensions consultant. RN plugged pt's cell phone in at bedside to charge.

## 2019-05-22 NOTE — ED Notes (Signed)
Endo and insulin paused immediately until blood and 2nd bolus completes

## 2019-05-22 NOTE — ED Notes (Signed)
Per order endo will be started back and CBG checked Q 30

## 2019-05-22 NOTE — ED Notes (Signed)
Per MD order due to normal potassium ;levels, Calcium gluconate cancelled.

## 2019-05-22 NOTE — Progress Notes (Signed)
Patient ID: Carolyn [name], female   DOB: 1940/09/07, 79 y.o.   MRN: [id number] Patient was admitted early this morning for hyperglycemia and started on insulin drip.  Patient seen and examined at bedside.  I have reviewed patient's medical records including this morning's H&P, current vitals, labs, medications myself.  Currently on insulin drip.  Last fingerstick glucose was 126 and currently patient is not nauseous or vomiting.  Will switch to long-acting insulin and turn off insulin drip after 2 hours.  Continue CBGs with SSI.

## 2019-05-23 DIAGNOSIS — N179 Acute kidney failure, unspecified: Secondary | ICD-10-CM

## 2019-05-23 DIAGNOSIS — C342 Malignant neoplasm of middle lobe, bronchus or lung: Secondary | ICD-10-CM | POA: Diagnosis not present

## 2019-05-23 DIAGNOSIS — I10 Essential (primary) hypertension: Secondary | ICD-10-CM

## 2019-05-23 DIAGNOSIS — I519 Heart disease, unspecified: Secondary | ICD-10-CM

## 2019-05-23 DIAGNOSIS — E875 Hyperkalemia: Secondary | ICD-10-CM

## 2019-05-23 DIAGNOSIS — C7931 Secondary malignant neoplasm of brain: Secondary | ICD-10-CM | POA: Diagnosis not present

## 2019-05-23 LAB — BASIC METABOLIC PANEL
Anion gap: 10 (ref 5–15)
BUN: 21 mg/dL (ref 8–23)
CO2: 28 mmol/L (ref 22–32)
Calcium: 8.4 mg/dL — ABNORMAL LOW (ref 8.9–10.3)
Chloride: 102 mmol/L (ref 98–111)
Creatinine, Ser: 1.01 mg/dL — ABNORMAL HIGH (ref 0.44–1.00)
GFR calc Af Amer: >60 mL/min (ref >60)
GFR calc non Af Amer: 54 mL/min — ABNORMAL LOW (ref >60)
Glucose, Bld: 252 mg/dL — ABNORMAL HIGH (ref 70–99)
Potassium: 4.3 mmol/L (ref 3.5–5.1)
Sodium: 140 mmol/L (ref 135–145)

## 2019-05-23 LAB — CBC WITH DIFFERENTIAL/PLATELET
Abs Immature Granulocytes: 0.29 10*3/uL — ABNORMAL HIGH (ref 0.00–0.07)
Basophils Absolute: 0.1 10*3/uL (ref 0.0–0.1)
Basophils Relative: 1 %
Eosinophils Absolute: 0.1 10*3/uL (ref 0.0–0.5)
Eosinophils Relative: 1 %
HCT: 33.1 % — ABNORMAL LOW (ref 36.0–46.0)
Hemoglobin: 10.7 g/dL — ABNORMAL LOW (ref 12.0–15.0)
Immature Granulocytes: 3 %
Lymphocytes Relative: 27 %
Lymphs Abs: 2.5 10*3/uL (ref 0.7–4.0)
MCH: 29.3 pg (ref 26.0–34.0)
MCHC: 32.3 g/dL (ref 30.0–36.0)
MCV: 90.7 fL (ref 80.0–100.0)
Monocytes Absolute: 0.7 10*3/uL (ref 0.1–1.0)
Monocytes Relative: 7 %
Neutro Abs: 5.6 10*3/uL (ref 1.7–7.7)
Neutrophils Relative %: 61 %
Platelets: 336 10*3/uL (ref 150–400)
RBC: 3.65 MIL/uL — ABNORMAL LOW (ref 3.87–5.11)
RDW: 14.8 % (ref 11.5–15.5)
WBC: 9.2 10*3/uL (ref 4.0–10.5)
nRBC: 0 % (ref 0.0–0.2)

## 2019-05-23 LAB — GLUCOSE, CAPILLARY: Glucose-Capillary: 217 mg/dL — ABNORMAL HIGH (ref 70–99)

## 2019-05-23 LAB — MAGNESIUM: Magnesium: 1.2 mg/dL — ABNORMAL LOW (ref 1.7–2.4)

## 2019-05-23 MED ORDER — ALPRAZOLAM 1 MG PO TABS: 0.5000 mg | ORAL_TABLET | Freq: Four times a day (QID) | ORAL | Status: AC | PRN

## 2019-05-23 MED ORDER — INSULIN GLARGINE 100 UNIT/ML ~~LOC~~ SOLN
25.0000 [IU] | Freq: Every day | SUBCUTANEOUS | Status: DC
  Administered 2019-05-23: 25 [IU] via SUBCUTANEOUS
  Filled 2019-05-23: qty 0.25

## 2019-05-23 MED ORDER — DESOXIMETASONE 0.25 % EX CREA: 1.0000 "application " | TOPICAL_CREAM | Freq: Two times a day (BID) | CUTANEOUS | Status: AC | PRN

## 2019-05-23 MED ORDER — LANTUS SOLOSTAR 100 UNIT/ML ~~LOC~~ SOPN: 37.0000 [IU] | PEN_INJECTOR | Freq: Every day | SUBCUTANEOUS | Status: DC

## 2019-05-23 MED ORDER — MAGNESIUM SULFATE 2 GM/50ML IV SOLN
2.0000 g | Freq: Once | INTRAVENOUS | Status: AC
  Administered 2019-05-23: 2 g via INTRAVENOUS
  Filled 2019-05-23: qty 50

## 2019-05-23 NOTE — ED Provider Notes (Signed)
Attestation: Medical screening examination/treatment/procedure(s) were conducted as a shared visit with non-physician practitioner(s) and myself.  I personally evaluated the patient during the encounter.   Briefly, the patient is a 79 y.o. female with h/o type 2 diabetes, hypertension, hypothyroidism, hyperlipidemia, cardiomyopathy, here for hyperglycemia.   Vitals:   05/23/19 0006 05/23/19 0407  BP:    Pulse:    Resp:    Temp: (!) 97.5 F (36.4 C) 97.7 F (36.5 C)  SpO2:      CONSTITUTIONAL:  well-appearing, NAD NEURO:  Alert and oriented x 3, no focal deficits EYES:  pupils equal and reactive ENT/NECK:  trachea midline, no JVD CARDIO:  Reg  rate, reg rhythm, well-perfused PULM:  None labored breathing GI/GU:  Abdomin non-distended MSK/SPINE:  No gross deformities, no edema SKIN:  no rash, atraumatic PSYCH:  Appropriate speech and behavior   EKG Interpretation  Date/Time:  Sunday May 21 2019 23:44:44 EDT Ventricular Rate:  84 PR Interval:  160 QRS Duration: 138 QT Interval:  426 QTC Calculation: 503 R Axis:   -88 Text Interpretation: Atrial-sensed ventricular-paced rhythm Biventricular pacemaker detected Abnormal ECG Confirmed by Addison Lank [phone]) on 05/21/2019 11:56:14 PM       Work up with hyperglycemia w/o DKA or HHS. No infectious source. IVF given with some improvement, but she ultimately required insulin gtt. Admitted for further management.  .Critical Care Performed by: Fatima Blank, MD Authorized by: Fatima Blank, MD    CRITICAL CARE Performed by: Grayce Sessions Yarisa Lynam Total critical care time: 10 minutes Critical care time was exclusive of separately billable procedures and treating other patients. Critical care was necessary to treat or prevent imminent or life-threatening deterioration. Critical care was time spent personally by me on the following activities: development of treatment plan with patient and/or surrogate as well  as nursing, discussions with consultants, evaluation of patient's response to treatment, examination of patient, obtaining history from patient or surrogate, ordering and performing treatments and interventions, ordering and review of laboratory studies, ordering and review of radiographic studies, pulse oximetry and re-evaluation of patient's condition.        Fatima Blank, MD 05/23/19 [phone]

## 2019-05-23 NOTE — TOC Transition Note (Signed)
Transition of Care [facility]) - CM/SW Discharge Note   Patient Details  Name: Carolyn [name] MRN: [id number] Date of Birth: 09/21/1940  [facility] CM/SW Contact:  Angelita Ingles, RN Phone Number: 05/23/2019, 11:20 AM   Clinical Narrative:    CM at bedside to present patient with medicare.gov list for options for home health PT. Patient states that she is from home alone and would like to get back home where she can independently take care of herself. Patient states that she is currently active with [facility] and has already spoken with physical therapist for appointment. This Probation officer verified with Tommi Rumps at [facility] that patient is currently active. Patient has a PCP Dr. Cathlean Cower. Appointment has been schedules for hospital follow up within 3-5 day window on 05/26/19 @1100 . Patient to be made aware of the scheduled appointment. Patient states that she has no transportation issues and can get to appointments by daughter or son. Patient confirms that she takes medications as schedules and has no barriers to obtaining meds. Patient has DME at home (walker & shower chair) No further needs at this time.     Final next level of care: [facility](Home health PT) Barriers to Discharge: No Barriers Identified   Patient Goals and CMS Choice Patient states their goals for this hospitalization and ongoing recovery are:: To be in her own home CMS Medicare.gov Compare Post Acute Care list provided to:: Patient(pt. active with [facility] and chooses to continue services) Choice offered to / list presented to : Patient  Discharge Placement                       Discharge Plan and Services                          HH Arranged: PT(Currently active with [facility]) [facility] Agency: [place] Date [facility] Agency Contacted: 05/23/19 Time [place]: 1030 Representative spoke with at [place]: [place] ([place]) Interventions      Readmission Risk Interventions Readmission Risk Prevention Plan 05/23/2019  Transportation Screening Complete  Medication Review Press photographer) Complete  PCP or Specialist appointment within 3-5 days of discharge Complete  HRI or [place] Not Complete  [place] Not Applicable  Some recent data might be hidden

## 2019-05-23 NOTE — Discharge Summary (Signed)
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 CT CERVICAL SPINE WITHOUT CONTRAST TECHNIQUE: Multidetector CT imaging of the cervical spine was performed without intravenous contrast. Multiplanar CT image reconstructions were also generated. COMPARISON:  Radiographs dated 04/17/2019 and CT scan dated 10/21/2018 FINDINGS: Alignment: Normal. Skull base and vertebrae: No acute fracture. No primary bone lesion or focal pathologic process. Soft tissues and spinal canal: No prevertebral fluid or swelling. No visible canal hematoma. Disc levels: There is disc space narrowing at C5-6 with a small broad-based disc osteophyte complex with slight left foraminal stenosis. There is disc space narrowing at C6-7. No neural impingement. There is ankylosis of the facet joints bilaterally at C3-4. Severe right facet arthritis at C4-5 without foraminal stenosis. Upper chest: Incompletely visualized irregular faint lesion in the posterior aspect of the right upper lobe as demonstrated on the prior chest CT of 10/21/2018. Other: Aortic atherosclerosis. AICD in place. IMPRESSION: No acute abnormality of the cervical spine. Degenerative disc and joint disease. Aortic Atherosclerosis (ICD10-I70.0).  Electronically Signed   By: Lorriane Shire M.D.   On: 04/25/2019 14:35   CT Code Stroke Cerebral Perfusion with contrast  Addendum Date: 04/30/2019   ADDENDUM REPORT: 04/30/2019 17:40 ADDENDUM: While reviewing this case for an upcoming tumor board conference, a focus of asymmetric enhancement is noted within the paramedian right parietooccipital lobe measuring 6 mm (series 12, image 18). This may reflect asymmetric vascular enhancement or an additional small metastatic lesion. These results were called by telephone on 2/21/2021at 5:36 pm to provider Dr. Karie Kirks, who verbally acknowledged these results. Electronically Signed   By: Kellie Simmering DO   On: 04/30/2019 17:40   Addendum Date: 04/25/2019   ADDENDUM REPORT: 04/25/2019 14:45 ADDENDUM: CTA and CT perfusion findings called by telephone at the time of interpretation on 04/25/2019 at 2:44 pm to provider [facility] , who verbally acknowledged these results. Electronically Signed   By: Kellie Simmering DO   On: 04/25/2019 14:45   Result Date: 04/30/2019 CLINICAL DATA:  Focal neuro deficit, greater than 6 hours, stroke suspected. Additional history obtained from Dr. Leonel Ramsay: Left-sided neglect, history of breast cancer. EXAM: CT ANGIOGRAPHY HEAD AND NECK CT PERFUSION BRAIN TECHNIQUE: Multidetector CT imaging of the head and neck was performed using the standard protocol during bolus administration of intravenous contrast. Multiplanar CT image reconstructions and MIPs were obtained to evaluate the vascular anatomy. Carotid stenosis measurements (when applicable) are obtained utilizing NASCET criteria, using the distal internal carotid diameter as the denominator. Multiphase CT imaging of the brain was performed following IV bolus contrast injection. Subsequent parametric perfusion maps were calculated using RAPID software. CONTRAST:  119m OMNIPAQUE IOHEXOL 350 MG/ML SOLN COMPARISON:  Concurrently performed noncontrast head CT 04/25/2019,  noncontrast head CT 11/17/2018, CT angiogram chest 02/22/2019 FINDINGS: CTA NECK FINDINGS Aortic arch: Standard aortic branching. Mixed  plaque within the visualized aortic arch and proximal major branch vessels of the neck. No high-grade innominate or proximal subclavian artery stenosis. Right carotid system: CCA and ICA patent within the neck without measurable stenosis (50% or greater). Mild scattered calcified plaque within the common carotid artery, carotid bifurcation and proximal right ICA. Left carotid system: CCA and ICA patent within the neck. Mild scattered calcified plaque within the common carotid artery. Moderate mixed plaque within the proximal ICA. Exact quantification of stenosis is difficult due to blooming of calcified plaque. Stenosis within the proximal ICA is estimated at 40%. Distal to this, the ICA is patent within the neck without stenosis. Vertebral arteries: Left vertebral artery dominant. The vertebral arteries are patent within the neck bilaterally without stenosis. Skeleton: No acute bony abnormality. Cervical spondylosis with multilevel posterior disc osteophytes, uncovertebral and facet hypertrophy. Other neck: No neck mass or cervical lymphadenopathy. Upper chest: A right upper lobe pulmonary nodule is again seen and again measures approximately 10 mm in size (series 6, image 323). This finding is again suspicious for metastatic disease. Pulmonary emphysema. Partially visualized left chest pacer/AICD leads. Review of the MIP images confirms the above findings CTA HEAD FINDINGS Anterior circulation: The intracranial right internal carotid artery is patent with scattered mixed plaque. Sites of mild stenosis within the pre cavernous and cavernous segment. The intracranial left internal carotid artery is patent with scattered calcified plaque. Mild/moderate stenosis within the cavernous segment. The M1 middle cerebral arteries are patent without significant stenosis. No M2 proximal branch  occlusion or high-grade proximal stenosis is identified. The anterior cerebral arteries are patent bilaterally without high-grade proximal stenosis. No intracranial aneurysm is identified. Posterior circulation: The non dominant intracranial right vertebral artery is patent and appears to terminate predominantly as the right PICA. The intracranial left vertebral artery is patent. Mild calcified plaque within this vessel without significant stenosis. The basilar artery is patent without significant stenosis. Predominantly fetal origin of the right posterior cerebral artery with hypoplastic right P1 segment. The posterior cerebral arteries are patent bilaterally without significant proximal stenosis. A left posterior communicating artery is not definitively identified. Venous sinuses: No pertinent prior studies available for comparison. Anatomic variants: As described. Delayed imaging: Delayed venous phase post-contrast imaging demonstrates a 9 mm cortically based enhancing lesion within the right frontoparietal lobes (series 12, image 25). Subtle adjacent leptomeningeal enhancement is also questioned (for instance as seen on series 12, image 24). Additionally, there is a 4 mm enhancing lesion versus asymmetric vascular enhancement within the left cerebellum (series 12, image 9). Review of the MIP images confirms the above findings CT Brain Perfusion Findings: CBF (<30%) Volume: 373m Perfusion (Tmax>6.0s) volume: 049mMismatch Volume: 73m66mnfarction Location:None identified by the perfusion software. IMPRESSION: CTA neck: 1. Atherosclerotic disease as detailed. 2. The right common and internal carotid arteries are patent within the neck without stenosis. 3. The left common and internal carotid arteries are patent within the neck. Moderate mixed plaque results in an estimated 40% stenosis within the proximal left ICA. 4. The bilateral vertebral arteries are patent within the neck without stenosis. Left vertebral artery  dominant. 5. Unchanged 10 mm nodule within the right upper lobe suspicious for metastasis. CTA head: 1. No intracranial large vessel occlusion. 2. Atherosclerotic disease within the intracranial internal carotid arteries. Sites of mild stenosis within the pre cavernous and cavernous right ICA. Sites of mild/moderate stenosis within the cavernous left ICA. 3. 10 mm enhancing cortical lesion within the right frontoparietal lobe. Subtle adjacent leptomeningeal enhancement  is also questioned. An additional 4 mm enhancing lesion is questioned within the left cerebellum versus asymmetric vascular enhancement. 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 Contrast-enhanced brain MRI is recommended for further evaluation, if the patient is able to have one. CT perfusion head: The perfusion software identifies no core infarct. The perfusion software identifies no region of critically hypoperfused brain parenchyma utilizing the Tmax>6 seconds threshold. Electronically Signed: By: Kellie Simmering DO On: 04/25/2019 14:40   DG Chest Portable 1 View  Result Date: 05/22/2019 CLINICAL DATA:  Hyperglycemia, known lung malignancy EXAM: PORTABLE CHEST 1 VIEW COMPARISON:  CT 02/22/2019, radiograph 04/17/2019 FINDINGS: Hazy opacity obscuration of the right heart border with tenting of the diaphragm compatible with persistent right middle lobe collapse. Underlying right hilar mass is poorly visualized, better seen on comparison cross-sectional imaging. Remaining portions of the lungs demonstrates some streaky basilar atelectatic changes. No pneumothorax or effusion. Mild cardiomegaly with pacer/defibrillator pack overlying left chest wall with leads at the right atrium, cardiac apex, and coronary sinus. The aorta is calcified. The remaining cardiomediastinal contours are unremarkable. No acute osseous abnormality or suspicious osseous lesion. Degenerative changes are present in the imaged spine and shoulders. IMPRESSION:  Persistent right middle lobe collapse. Underlying right hilar mass is poorly visualized, better seen on comparison cross-sectional imaging. Basilar atelectasis, otherwise no other acute cardiopulmonary abnormality. Electronically Signed   By: Lovena Le M.D.   On: 05/22/2019 01:33   EEG adult  Result Date: 04/25/2019 Lora Havens, MD     04/26/2019  8:48 AM Patient Name: YOONA ISHII MRN: [id number] Epilepsy Attending: Lora Havens Referring Physician/Provider: Dr Zane Herald Date: 04/25/2019 Duration: 42.27 mins Patient history: TYISHA CRESSY is a 79 y.o. female with a history of adenocarcinoma of the lung who presents after being found down and unresponsive by her daughter today. She also had a left gaze preference per EMS on arrival.  She was not speaking at all on their arrival, began improving on route. EEG to evaluate for status. Level of alertness: lethargic AEDs during EEG study: Ativan Technical aspects: This EEG study was done with scalp electrodes positioned according to the 10-20 International system of electrode placement. Electrical activity was acquired at a sampling rate of _0  and reviewed with a high frequency filter of _1  and a low frequency filter of _2 . EEG data were recorded continuously and digitally stored. DESCRIPTION: No clear posterior dominant rhythm was seen. EEG showed continuous generalized polymorphic high amplitude 5-_3  theta slowing. IV ativan was administered during the study after which per neurologist Dr Leonel Ramsay, patient clinically appeared improved. EEG continued to show continuous 5-_4  theta slowing with lower amplitude. Hyperventilation and photic stimulation were not performed. ABNORMALITY - Continuous slow, generalized IMPRESSION: This study is suggestive of moderate diffuse encephalopathy, non specific to etiology. Per bedside neurologist, patient improved after IV ativan which although not specific does raise suspicion for ictal/post-ictal  activity. No definite seizures or epileptiform discharges were seen throughout the recording. Priyanka Barbra Sarks   Overnight EEG with video  Result Date: 04/27/2019 Lora Havens, MD     04/27/2019  2:04 PM Patient Name: MARGARETH KANNER MRN: [id number] Epilepsy Attending: Lora Havens Referring Physician/Provider: Dr Zane Herald Duration:  04/26/2019 1154 to 04/27/2019 1124  Patient history: Annalissa Murphey Smithis a 79 y.o.femalewith a history of adenocarcinoma of the lung who presents after being found down and unresponsive by her daughter today. She also had a left gaze preference per EMS on arrival. She  was not speaking at all on their arrival,began improving on route. EEG to evaluate for status.  Level of alertness:  Awake, sleep  AEDs during EEG study:  Keppra  Technical aspects: This EEG study was done with scalp electrodes positioned according to the 10-20 International system of electrode placement. Electrical activity was acquired at a sampling rate of '500Hz'$  and reviewed with a high frequency filter of '70Hz'$  and a low frequency filter of '1Hz'$ . EEG data were recorded continuously and digitally stored.  DESCRIPTION:  The posterior dominant rhythm consists of 7.5-8 Hz activity of moderate voltage (25-35 uV) seen predominantly in posterior head regions, symmetric and reactive to eye opening and eye closing.      Sleep was characterized by vertex waves, sleep spindles (13 to 15 Hz), maximal frontocentral as well as REM sleep.  EEG showed continuous generalized 5 to 7 Hz theta slowing. Spikes were also seen in the right parieto-occipital region, maximal P4/P8/O2 ABNORMALITY - Continuous slow, generalized - Spike, right parieto-occipital region, maximal P4/P8/O2 IMPRESSION: This study is suggestive of epileptogenicity arising from the right parieto-occipital region as well as mild diffuse encephalopathy, non specific to etiology. No definite seizures were seen throughout the recording. EEG appears to  be improving compared to previous routine study on 04/25/2019. Lora Havens   ECHOCARDIOGRAM COMPLETE  Result Date: 04/26/2019    ECHOCARDIOGRAM REPORT   Patient Name:   SREYA FROIO Date of Exam: 04/26/2019 Medical Rec #:  [id number]     Height:       61.0 in Accession #:    [id number]    Weight:       166.2 lb Date of Birth:  1941-03-04      BSA:          1.75 m Patient Age:    56 years      BP:           124/64 mmHg Patient Gender: F             HR:           79 bpm. Exam Location:  Inpatient Procedure: 2D Echo, Color Doppler and Cardiac Doppler Indications:    I48.91* Unspecified atrial fibrillation  History:        Patient has prior history of Echocardiogram examinations, most                 recent 10/23/2018. Pacemaker and Defibrillator, Arrythmias:LBBB;                 Risk Factors:Hypertension, Diabetes and Dyslipidemia.  Sonographer:    Raquel Sarna Senior RDCS Referring Phys: [id number] JENNIFER CHOI  Sonographer Comments: Technically difficult study due to poor echo windows. IMPRESSIONS  1. Technically difficult study. Not all wall segments visualized. EF mildly decreased, with mild hypokinesis of the basal inferoseptum, inferior, and inferolateral walls. Basal anterolateral wall not well seen. When compared to prior study, these walls were better seen on previous study and were within normal limits.  2. Left ventricular ejection fraction, by estimation, is 45 to 50%. The left ventricle has mildly decreased function. The left ventricle demonstrates regional wall motion abnormalities (see scoring diagram/findings for description). There is mild asymmetric left ventricular hypertrophy. Left ventricular diastolic parameters are indeterminate.  3. Right ventricular systolic function was not well visualized. The right ventricular size is not well visualized. There is normal pulmonary artery systolic pressure.  4. The mitral valve is grossly normal. No evidence of mitral valve regurgitation. No evidence of mitral  stenosis.  5. The aortic valve is grossly normal. Aortic valve regurgitation is not visualized. No aortic stenosis is present. FINDINGS  Left Ventricle: Left ventricular ejection fraction, by estimation, is 45 to 50%. The left ventricle has mildly decreased function. The left ventricle demonstrates regional wall motion abnormalities. The left ventricular internal cavity size was normal in size. There is mild asymmetric left ventricular hypertrophy. Abnormal (paradoxical) septal motion, consistent with RV pacemaker. Left ventricular diastolic parameters are indeterminate.  LV Wall Scoring: The basal inferolateral segment, basal anterolateral segment, and basal inferior segment are hypokinetic. The mid and distal anterior wall, mid and distal lateral wall, entire septum, entire apex, mid and distal inferior wall, and mid anterolateral segment are normal. Right Ventricle: The right ventricular size is not well visualized. Right vetricular wall thickness was not assessed. Right ventricular systolic function was not well visualized. There is normal pulmonary artery systolic pressure. The tricuspid regurgitant velocity is 2.38 m/s, and with an assumed right atrial pressure of 3 mmHg, the estimated right ventricular systolic pressure is 03.0 mmHg. Left Atrium: Left atrial size was normal in size. Right Atrium: Right atrial size was not well visualized. Pericardium: There is no evidence of pericardial effusion. Mitral Valve: The mitral valve is grossly normal. No evidence of mitral valve regurgitation. No evidence of mitral valve stenosis. Tricuspid Valve: The tricuspid valve is not well visualized. Tricuspid valve regurgitation is not demonstrated. No evidence of tricuspid stenosis. Aortic Valve: The aortic valve is grossly normal. Aortic valve regurgitation is not visualized. No aortic stenosis is present. Pulmonic Valve: The pulmonic valve was not well visualized. Pulmonic valve regurgitation is not visualized. Aorta:  The aortic root and ascending aorta are structurally normal, with no evidence of dilitation. IAS/Shunts: The atrial septum is grossly normal. Additional Comments: A pacer wire is visualized.  LEFT VENTRICLE PLAX 2D LVIDd:         4.55 cm  Diastology LVIDs:         3.63 cm  LV e' lateral:   4.46 cm/s LV PW:         0.74 cm  LV E/e' lateral: 13.6 LV IVS:        1.39 cm  LV e' medial:    4.90 cm/s LVOT diam:     1.90 cm  LV E/e' medial:  12.4 LV SV:         34.02 ml LV SV Index:   21.49 LVOT Area:     2.84 cm  LEFT ATRIUM             Index LA diam:        2.30 cm 1.32 cm/m LA Vol (A2C):   50.8 ml 29.10 ml/m LA Vol (A4C):   36.1 ml 20.68 ml/m LA Biplane Vol: 44.1 ml 25.26 ml/m  AORTIC VALVE LVOT Vmax:   66.60 cm/s LVOT Vmean:  46.600 cm/s LVOT VTI:    0.120 m  AORTA Ao Asc diam: 2.80 cm MITRAL VALVE               TRICUSPID VALVE MV Area (PHT): 2.71 cm    TR Peak grad:   22.7 mmHg MV Decel Time: 280 msec    TR Vmax:        238.00 cm/s MV E velocity: 60.80 cm/s MV A velocity: 48.00 cm/s  SHUNTS MV E/A ratio:  1.27        Systemic VTI:  0.12 m  Systemic Diam: 1.90 cm Buford Dresser MD Electronically signed by Buford Dresser MD Signature Date/Time: 04/26/2019/9:40:19 PM    Final    CT HEAD CODE STROKE WO CONTRAST  Result Date: 04/25/2019 CLINICAL DATA:  Code stroke. Possible stroke; cerebral hemorrhage suspected. Additional history obtained from Dr. Leonel Ramsay: Patient presenting with left-sided neglect, history of breast cancer, possible metastases. EXAM: CT HEAD WITHOUT CONTRAST TECHNIQUE: Contiguous axial images were obtained from the base of the skull through the vertex without intravenous contrast. COMPARISON:  Head CT 11/17/2018 FINDINGS: Brain: Mildly motion degraded examination There is no evidence of acute intracranial hemorrhage or acute demarcated cortical infarction. There is a moderate to large region of hypodensity within the right frontoparietal white matter  without definite loss of gray-white differentiation. Mild background ill-defined hypoattenuation within the cerebral white matter is nonspecific, but consistent with chronic small vessel ischemic disease. 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 There is no midline shift or extra-axial fluid collection. Moderate generalized parenchymal atrophy. Vascular: No hyperdense vessel.  Atherosclerotic calcifications. Skull: Normal. Negative for fracture or focal lesion. Sinuses/Orbits: Visualized orbits demonstrate no acute abnormality. No significant paranasal sinus disease or mastoid effusion. These results were called by telephone at the time of interpretation on 04/25/2019 at 2:00 pm to provider Dr. Leonel Ramsay, who verbally acknowledged these results. IMPRESSION: No evidence of acute intracranial hemorrhage. Moderate to large region of hypoattenuation within the right frontoparietal white matter. Findings are suspicious for vasogenic edema secondary to an underlying mass, possibly related to reported history of breast cancer. An acute white matter infarct cannot be excluded. Contrast-enhanced brain MRI is recommended for further evaluation. No definite loss of gray-white differentiation. Background moderate generalized parenchymal atrophy with mild chronic small vessel ischemic disease. Redemonstrated chronic lacunar infarcts within bilateral basal ganglia. Electronically Signed   By: Kellie Simmering DO   On: 04/25/2019 14:08       Subjective: Patient seen and examined at bedside.  She feels much better and is okay to go home.  No overnight fever, nausea or vomiting.  No worsening of his back.  Discharge Exam: Vitals:   05/23/19 0407 05/23/19 0836  BP:  138/86  Pulse:  85  Resp:  20  Temp: 97.7 F (36.5 C) 97.9 F (36.6 C)  SpO2:  98%    General: Pt is alert, awake, not in acute distress Cardiovascular: rate controlled, S1/S2 + Respiratory: bilateral decreased breath  sounds at bases with some scattered crackles Abdominal: Soft, NT, ND, bowel sounds + Extremities: Trace lower extremity edema, no cyanosis    The results of significant diagnostics from this hospitalization (including imaging, microbiology, ancillary and laboratory) are listed below for reference.     Microbiology: Recent Results (from the past 240 hour(s))  SARS CORONAVIRUS 2 (TAT 6-24 HRS) Nasopharyngeal Nasopharyngeal Swab     Status: None   Collection Time: 05/22/19 12:56 AM   Specimen: Nasopharyngeal Swab  Result Value Ref Range Status   SARS Coronavirus 2 NEGATIVE NEGATIVE Final    Comment: (NOTE) SARS-CoV-2 target nucleic acids are NOT DETECTED. The SARS-CoV-2 RNA is generally detectable in upper and lower respiratory specimens during the acute phase of infection. Negative results do not preclude SARS-CoV-2 infection, do not rule out co-infections with other pathogens, and should not be used as the sole basis for treatment or other patient management decisions. Negative results must be combined with clinical observations, patient history, and epidemiological information. The expected result is Negative. Fact Sheet for Patients: SugarRoll.be Fact Sheet for Healthcare  Providers: https://www.woods-mathews.com/ This test is not yet approved or cleared by the [place] and  has been authorized for detection and/or diagnosis of SARS-CoV-2 by FDA under an Emergency Use Authorization (EUA). This EUA will remain  in effect (meaning this test can be used) for the duration of the COVID-19 declaration under Section 56 4(b)(1) of the Act, 21 U.S.C. section 360bbb-3(b)(1), unless the authorization is terminated or revoked sooner. Performed at [facility], [place] [address], [place], [place] [address]      Labs: BNP (last 3 results) Recent Labs    02/22/19 0032 02/28/19 1822  BNP 242.1* 485.4*   Basic Metabolic  Panel: Recent Labs  Lab 05/21/19 2245 05/22/19 0159 05/22/19 0313 05/22/19 0511 05/23/19 0239  NA 123* 127* 130* 133* 140  K 6.2* 4.8 4.7 3.9 4.3  CL 86*  --  94* 97* 102  CO2 25  --  _0 GLUCOSE 750*  --  514* 361* 252*  BUN 22  --  _1 CREATININE 1.14*  --  0.90 0.90 1.01*  CALCIUM 9.0  --  8.5* 8.2* 8.4*  MG  --   --   --   --  1.2*   Liver Function Tests: No results for input(s): AST, ALT, ALKPHOS, BILITOT, PROT, ALBUMIN in the last 168 hours. No results for input(s): LIPASE, AMYLASE in the last 168 hours. No results for input(s): AMMONIA in the last 168 hours. CBC: Recent Labs  Lab 05/21/19 2245 05/22/19 0159 05/23/19 0239  WBC 11.0*  --  9.2  NEUTROABS  --   --  5.6  HGB 12.4 12.2 10.7*  HCT 38.1 36.0 33.1*  MCV 88.4  --  90.7  PLT 338  --  336   Cardiac Enzymes: No results for input(s): CKTOTAL, CKMB, CKMBINDEX, TROPONINI in the last 168 hours. BNP: Invalid input(s): POCBNP CBG: Recent Labs  Lab 05/22/19 1213 05/22/19 1305 05/22/19 1636 05/22/19 2109 05/23/19 0736  GLUCAP 142* 137* 241* 382* 217*   D-Dimer No results for input(s): DDIMER in the last 72 hours. Hgb A1c Recent Labs    05/22/19 0511  HGBA1C 9.6*   Lipid Profile No results for input(s): CHOL, HDL, LDLCALC, TRIG, CHOLHDL, LDLDIRECT in the last 72 hours. Thyroid function studies No results for input(s): TSH, T4TOTAL, T3FREE, THYROIDAB in the last 72 hours.  Invalid input(s): FREET3 Anemia work up No results for input(s): VITAMINB12, FOLATE, FERRITIN, TIBC, IRON, RETICCTPCT in the last 72 hours. Urinalysis    Component Value Date/Time   COLORURINE YELLOW 05/21/2019 2336   APPEARANCEUR CLEAR 05/21/2019 2336   LABSPEC <1.005 (L) 05/21/2019 2336   PHURINE 5.5 05/21/2019 2336   GLUCOSEU >=500 (A) 05/21/2019 2336   GLUCOSEU NEGATIVE 04/08/2018 1525   HGBUR TRACE (A) 05/21/2019 2336   BILIRUBINUR NEGATIVE 05/21/2019 2336   KETONESUR NEGATIVE 05/21/2019 2336   PROTEINUR  NEGATIVE 05/21/2019 2336   UROBILINOGEN 0.2 04/08/2018 1525   NITRITE NEGATIVE 05/21/2019 2336   LEUKOCYTESUR NEGATIVE 05/21/2019 2336   Sepsis Labs Invalid input(s): PROCALCITONIN,  WBC,  LACTICIDVEN Microbiology Recent Results (from the past 240 hour(s))  SARS CORONAVIRUS 2 (TAT 6-24 HRS) Nasopharyngeal Nasopharyngeal Swab     Status: None   Collection Time: 05/22/19 12:56 AM   Specimen: Nasopharyngeal Swab  Result Value Ref Range Status   SARS Coronavirus 2 NEGATIVE NEGATIVE Final    Comment: (NOTE) SARS-CoV-2 target nucleic acids are NOT DETECTED. The SARS-CoV-2 RNA is generally detectable in upper and  lower respiratory specimens during the acute phase of infection. Negative results do not preclude SARS-CoV-2 infection, do not rule out co-infections with other pathogens, and should not be used as the sole basis for treatment or other patient management decisions. Negative results must be combined with clinical observations, patient history, and epidemiological information. The expected result is Negative. Fact Sheet for Patients: SugarRoll.be Fact Sheet for Healthcare Providers: https://www.woods-mathews.com/ This test is not yet approved or cleared by the [place] FDA and  has been authorized for detection and/or diagnosis of SARS-CoV-2 by FDA under an Emergency Use Authorization (EUA). This EUA will remain  in effect (meaning this test can be used) for the duration of the COVID-19 declaration under Section 56 4(b)(1) of the Act, 21 U.S.C. section 360bbb-3(b)(1), unless the authorization is terminated or revoked sooner. Performed at [facility], [place] [address], [place], [place] [address]      Time coordinating discharge: 35 minutes  SIGNED:   Aline August, MD  [facility] 05/23/2019, 9:40 AM

## 2019-05-23 NOTE — Evaluation (Signed)
Physical Therapy Evaluation Patient Details Name: Carolyn [name] MRN: [id number] DOB: [birth year] Today's Date: 05/23/2019   History of Present Illness  79 year old female admitted 05/21/19 for hyperglycemia, possibly due to steroid use. Blood glucose 750 on arrival. Patient initially started on an insulin drip. Her blood sugars improved and was transitioned to long acting insulin. 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 Per her report, it sounds like she was recently at [facility] for short term rehabilitation and recently discharged home with home PT services. Recommend resuming these home PT services and intermittent supervision/assist from her family who appear to be involved per patient report.    Follow Up Recommendations Home health PT;Supervision - Intermittent    Equipment Recommendations  None recommended by PT       Precautions / Restrictions Precautions Precautions: Fall Restrictions Weight Bearing Restrictions: No      Mobility  Bed Mobility General bed mobility comments: Patient sitting EOB upon PT arrival.  Transfers Overall transfer level: Independent Equipment used: None Transfers: Sit to/from Stand Sit to Stand: Independent         General transfer comment: sit<>stand from EOB x 2 trials  Ambulation/Gait Ambulation/Gait assistance: Modified independent (Device/Increase time);Independent Gait Distance (Feet): 150 Feet Assistive device: None Gait Pattern/deviations: Step-through pattern     General Gait Details: No LOB, able to change direction, scan environment, turn 180 degrees without LOB  Stairs Stairs: (patient reports her dtr will assist her into the  home)                 Balance Overall balance assessment: No apparent balance deficits (not formally assessed)      Pertinent Vitals/Pain Pain Assessment: No/denies pain    Home Living Family/patient expects to be discharged to:: Private residence Living Arrangements: Alone Available Help at Discharge: Family;Available PRN/intermittently Type of Home: House Home Access: Stairs to enter   [employer] of Steps: 1(1 step at front entrance, 3 at back with railing) Home Layout: One level Home Equipment: Grab bars - tub/shower;Shower seat;Walker - 2 wheels Additional Comments: Patient independent PTA, has RW but does not use it, has shower chair but does not use it. +driver, does her own shopping, son lives next door    Prior Function Level of Independence: Independent         Comments: Patient reports she likes to do for herself.        Extremity/Trunk Assessment        Lower Extremity Assessment Lower Extremity Assessment: RLE deficits/detail(patient reports peripheral neuropathy symptoms in hands) RLE Deficits / Details: R hip flexion 4-/5       Communication   Communication: No difficulties  Cognition Arousal/Alertness: Awake/alert     General Comments General comments (skin integrity, edema, etc.): HR 93bpm, Oxygen saturation 94% post ambulation trial on room air, DOE noted        Assessment/Plan    PT Assessment Patient needs continued PT services  PT Problem List Decreased strength;Decreased activity tolerance;Decreased balance;Decreased mobility       PT Treatment Interventions Gait training;Stair training;Functional mobility training;Therapeutic activities;Therapeutic exercise;Balance training;Patient/family education    PT Goals (Current goals can be found in the Care Plan section)  Acute Rehab PT Goals Time For Goal Achievement: 06/05/19 Potential  to Achieve Goals: Good    Frequency Min 3X/week   Barriers to discharge    none anticipated       AM-PAC PT "6 Clicks" Mobility  Outcome Measure Help needed turning from your back to your side while in a flat bed without using bedrails?: None Help needed moving from lying on your back to sitting on the side of a flat bed without using bedrails?: None Help needed moving to and from a bed to a chair (including a wheelchair)?: None Help needed standing up from a chair using your arms (e.g., wheelchair or bedside chair)?: None Help needed to walk in hospital room?: None Help needed climbing 3-5 steps with a railing? : A Little 6 Click Score: 23    End of Session   Activity Tolerance: Patient tolerated treatment well Patient left: with call bell/phone within reach(sitting EOB, nurse aware) Nurse Communication: Mobility status PT Visit Diagnosis: Unsteadiness on feet (R26.81);Muscle weakness (generalized) (M62.81)    Time: 5573-2202 PT Time Calculation (min) (ACUTE ONLY): 35 min   Charges:   PT Evaluation $PT Eval Moderate Complexity: 1 Mod          Birdie Hopes, PT, DPT Acute Rehab [phone] office    Birdie Hopes 05/23/2019, 11:08 AM

## 2019-05-23 NOTE — Progress Notes (Signed)
Patient was stable at discharge. NT removed pt's IV. We reviewed the discharge education. Patient verbalized understanding and had no further questions. Patient left with belongings in hand.

## 2019-05-23 NOTE — Plan of Care (Signed)
Pt's care plan goals met. Pt adequate for discharge.  

## 2019-05-24 ENCOUNTER — Encounter: Payer: Self-pay | Admitting: Radiation Oncology

## 2019-05-24 ENCOUNTER — Emergency Department (HOSPITAL_COMMUNITY)
Admission: EM | Admit: 2019-05-24 | Discharge: 2019-05-25 | Disposition: A | Discharge status: Home / Self Care | Payer: Medicare Other | Attending: Emergency Medicine | Admitting: Emergency Medicine

## 2019-05-24 ENCOUNTER — Encounter (HOSPITAL_COMMUNITY): Payer: Self-pay

## 2019-05-24 ENCOUNTER — Other Ambulatory Visit: Payer: Self-pay

## 2019-05-24 ENCOUNTER — Ambulatory Visit
Admission: RE | Admit: 2019-05-24 | Discharge: 2019-05-24 | Disposition: A | Discharge status: Home / Self Care | Payer: Medicare Other | Source: Ambulatory Visit | Attending: Radiation Oncology | Admitting: Radiation Oncology

## 2019-05-24 DIAGNOSIS — E1165 Type 2 diabetes mellitus with hyperglycemia: Secondary | ICD-10-CM | POA: Diagnosis not present

## 2019-05-24 DIAGNOSIS — Z5321 Procedure and treatment not carried out due to patient leaving prior to being seen by health care provider: Secondary | ICD-10-CM | POA: Insufficient documentation

## 2019-05-24 DIAGNOSIS — Z743 Need for continuous supervision: Secondary | ICD-10-CM | POA: Diagnosis not present

## 2019-05-24 DIAGNOSIS — C342 Malignant neoplasm of middle lobe, bronchus or lung: Secondary | ICD-10-CM | POA: Diagnosis not present

## 2019-05-24 DIAGNOSIS — C3490 Malignant neoplasm of unspecified part of unspecified bronchus or lung: Secondary | ICD-10-CM | POA: Diagnosis not present

## 2019-05-24 DIAGNOSIS — C7931 Secondary malignant neoplasm of brain: Secondary | ICD-10-CM | POA: Diagnosis not present

## 2019-05-24 DIAGNOSIS — R739 Hyperglycemia, unspecified: Secondary | ICD-10-CM | POA: Diagnosis present

## 2019-05-24 DIAGNOSIS — R609 Edema, unspecified: Secondary | ICD-10-CM | POA: Diagnosis not present

## 2019-05-24 DIAGNOSIS — I10 Essential (primary) hypertension: Secondary | ICD-10-CM | POA: Diagnosis not present

## 2019-05-24 LAB — CBG MONITORING, ED: Glucose-Capillary: 468 mg/dL — ABNORMAL HIGH (ref 70–99)
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
## 2019-05-24 NOTE — Telephone Encounter (Signed)
Will ask inpatient EP APP to look in on patient tomorrow. I think we should increase her metoprolol to 50mg  BID if her BP will allow.  Given lung Ca with brain mets, we should ultimately think about goals of care.  I am happy to see her in the office once she has recovered from her brain surgery.

## 2019-05-24 NOTE — Op Note (Signed)
Stereotactic Radiosurgery Operative Note  Name: Carolyn [name] MRN: [id number]  Date: 05/24/2019  DOB: 06-12-1940  Op Note  Pre Operative Diagnosis: Right parietal brain metastasis from metastatic NSCLC  Post Operative Diagnois: Right parietal brain metastasis from metastatic NSCLC  3D TREATMENT PLANNING AND DOSIMETRY:  The patient's radiation plan was reviewed and approved by myself (neurosurgery) and Dr. Kyung Rudd (radiation oncology) prior to treatment.  It showed 3-dimensional radiation distributions overlaid onto the planning CT image set.  The [facility] for the target structures as well as the organs at risk were reviewed. The documentation of the 3D plan and dosimetry are filed in the radiation oncology EMR.  NARRATIVE:  Carolyn [name] was brought to the TrueBeam stereotactic radiation treatment machine and placed supine on the CT couch. The head frame was applied, and the patient was set up for stereotactic radiosurgery.  I was present for the set-up and delivery.  SIMULATION VERIFICATION:  In the couch zero-angle position, the patient underwent Exactrac imaging using the Brainlab system with orthogonal KV images.  These were carefully aligned and repeated to confirm treatment position for each of the isocenters.  The Exactrac snap film verification was repeated at each couch angle.  SPECIAL TREATMENT PROCEDURE: Carolyn [name] received stereotactic radiosurgery to the following targets: Right parietal target was treated using 3 Dynamic Conformal Arcs to a prescription dose of 20 Gy.  ExacTrac registration was performed for each couch angle.  The 100 % isodose line was prescribed.  STEREOTACTIC TREATMENT MANAGEMENT:  Following delivery, the patient was transported to nursing in stable condition and monitored for possible acute effects.  Vital signs were recorded. The patient tolerated treatment without significant acute effects, and was discharged to home in stable condition.    PLAN: Follow-up in  one month.

## 2019-05-24 NOTE — ED Triage Notes (Signed)
Pt comes via [facility] for hyperglycemia, currently getting radiation for cancer and steroids which is causing her CBG to be high, recently admitted for the same.

## 2019-05-25 ENCOUNTER — Telehealth: Payer: Self-pay | Admitting: *Deleted

## 2019-05-25 ENCOUNTER — Telehealth: Payer: Self-pay

## 2019-05-25 DIAGNOSIS — M47815 Spondylosis without myelopathy or radiculopathy, thoracolumbar region: Secondary | ICD-10-CM | POA: Diagnosis not present

## 2019-05-25 DIAGNOSIS — I499 Cardiac arrhythmia, unspecified: Secondary | ICD-10-CM | POA: Diagnosis not present

## 2019-05-25 DIAGNOSIS — K7469 Other cirrhosis of liver: Secondary | ICD-10-CM | POA: Diagnosis not present

## 2019-05-25 DIAGNOSIS — M4802 Spinal stenosis, cervical region: Secondary | ICD-10-CM | POA: Diagnosis not present

## 2019-05-25 DIAGNOSIS — M4726 Other spondylosis with radiculopathy, lumbar region: Secondary | ICD-10-CM | POA: Diagnosis not present

## 2019-05-25 DIAGNOSIS — I447 Left bundle-branch block, unspecified: Secondary | ICD-10-CM | POA: Diagnosis not present

## 2019-05-25 DIAGNOSIS — I700 Atherosclerosis of aorta: Secondary | ICD-10-CM | POA: Diagnosis not present

## 2019-05-25 DIAGNOSIS — M47812 Spondylosis without myelopathy or radiculopathy, cervical region: Secondary | ICD-10-CM | POA: Diagnosis not present

## 2019-05-25 DIAGNOSIS — D630 Anemia in neoplastic disease: Secondary | ICD-10-CM | POA: Diagnosis not present

## 2019-05-25 DIAGNOSIS — M2578 Osteophyte, vertebrae: Secondary | ICD-10-CM | POA: Diagnosis not present

## 2019-05-25 DIAGNOSIS — I509 Heart failure, unspecified: Secondary | ICD-10-CM | POA: Diagnosis not present

## 2019-05-25 DIAGNOSIS — J439 Emphysema, unspecified: Secondary | ICD-10-CM | POA: Diagnosis not present

## 2019-05-25 DIAGNOSIS — K7290 Hepatic failure, unspecified without coma: Secondary | ICD-10-CM | POA: Diagnosis not present

## 2019-05-25 DIAGNOSIS — M5030 Other cervical disc degeneration, unspecified cervical region: Secondary | ICD-10-CM | POA: Diagnosis not present

## 2019-05-25 DIAGNOSIS — N179 Acute kidney failure, unspecified: Secondary | ICD-10-CM | POA: Diagnosis not present

## 2019-05-25 DIAGNOSIS — I428 Other cardiomyopathies: Secondary | ICD-10-CM | POA: Diagnosis not present

## 2019-05-25 DIAGNOSIS — G8929 Other chronic pain: Secondary | ICD-10-CM | POA: Diagnosis not present

## 2019-05-25 DIAGNOSIS — C7931 Secondary malignant neoplasm of brain: Secondary | ICD-10-CM | POA: Diagnosis not present

## 2019-05-25 DIAGNOSIS — I110 Hypertensive heart disease with heart failure: Secondary | ICD-10-CM | POA: Diagnosis not present

## 2019-05-25 DIAGNOSIS — C3491 Malignant neoplasm of unspecified part of right bronchus or lung: Secondary | ICD-10-CM | POA: Diagnosis not present

## 2019-05-25 DIAGNOSIS — E1165 Type 2 diabetes mellitus with hyperglycemia: Secondary | ICD-10-CM | POA: Diagnosis not present

## 2019-05-25 DIAGNOSIS — I4891 Unspecified atrial fibrillation: Secondary | ICD-10-CM | POA: Diagnosis not present

## 2019-05-25 DIAGNOSIS — K7581 Nonalcoholic steatohepatitis (NASH): Secondary | ICD-10-CM | POA: Diagnosis not present

## 2019-05-25 DIAGNOSIS — D473 Essential (hemorrhagic) thrombocythemia: Secondary | ICD-10-CM | POA: Diagnosis not present

## 2019-05-25 LAB — URINALYSIS, ROUTINE W REFLEX MICROSCOPIC
Bacteria, UA: NONE SEEN
Bilirubin Urine: NEGATIVE
Glucose, UA: >=500 mg/dL — AB
Ketones, ur: NEGATIVE mg/dL
Leukocytes,Ua: NEGATIVE
Nitrite: NEGATIVE
Protein, ur: NEGATIVE mg/dL
Specific Gravity, Urine: 1.021 (ref 1.005–1.030)
pH: 5 (ref 5.0–8.0)

## 2019-05-25 LAB — BASIC METABOLIC PANEL
Anion gap: 12 (ref 5–15)
BUN: 19 mg/dL (ref 8–23)
CO2: 22 mmol/L (ref 22–32)
Calcium: 9.1 mg/dL (ref 8.9–10.3)
Chloride: 99 mmol/L (ref 98–111)
Creatinine, Ser: 1.25 mg/dL — ABNORMAL HIGH (ref 0.44–1.00)
GFR calc Af Amer: 48 mL/min — ABNORMAL LOW (ref >60)
GFR calc non Af Amer: 41 mL/min — ABNORMAL LOW (ref >60)
Glucose, Bld: 494 mg/dL — ABNORMAL HIGH (ref 70–99)
Potassium: 5.1 mmol/L (ref 3.5–5.1)
Sodium: 133 mmol/L — ABNORMAL LOW (ref 135–145)

## 2019-05-25 LAB — CBC
HCT: 37.3 % (ref 36.0–46.0)
Hemoglobin: 12 g/dL (ref 12.0–15.0)
MCH: 29.5 pg (ref 26.0–34.0)
MCHC: 32.2 g/dL (ref 30.0–36.0)
MCV: 91.6 fL (ref 80.0–100.0)
Platelets: 396 10*3/uL (ref 150–400)
RBC: 4.07 MIL/uL (ref 3.87–5.11)
RDW: 14.6 % (ref 11.5–15.5)
WBC: 11.4 10*3/uL — ABNORMAL HIGH (ref 4.0–10.5)
nRBC: 0 % (ref 0.0–0.2)

## 2019-05-25 LAB — CBG MONITORING, ED: Glucose-Capillary: 380 mg/dL — ABNORMAL HIGH (ref 70–99)

## 2019-05-25 NOTE — Telephone Encounter (Signed)
[place] and gave her PCP response. Carolyn [name] stated understanding. I did mention that she may benefit from endo referral. She may qualify for an insulin pump.  Carolyn [name] will be with patient at the appointment tomorrow.

## 2019-05-25 NOTE — Telephone Encounter (Signed)
Follow up call   Carolyn [name]- granddaughter calling for direction regarding insulin Please call [phone]

## 2019-05-25 NOTE — Telephone Encounter (Signed)
Patient's grand daughter calling and spoke with [facility] on 05/24/2019 7:26:28 PM and states  her grandmother just got out of the hospital for high blood sugar. She is using an insulin pen now. Has been diabetic for years. Her blood sugar is at 450 about 20 minutes ago. She is is supposed to call the doctor if it is over 400. Her grandmother is acting fine. Takes lantus 37 units every am, suppose to take the novalogue 5 units for bs greater than 400. As needed when its high. Caller is concerned and afraid to take the pen without asking a nurse.  Patient was connected with On Call provider Dr Derrel Nip.  Patient's grand daughter called again at 05/24/2019 9:30:26 PM and states  her grandmother's blood sugar is registering high. She was discharged from the hospital yesterday for the same reason. She is currently taking dexamethasone 2mg  daily for lung cancer with with brain mets. Not having any sx.   Advised to go to ED now.   Caller does not want to go to ER, says she was seen in ER last night for the same issue. Nurse will call the on call physician. User: Prescilla Sours, RN Date/Time Eilene Ghazi Time): 05/24/2019 9:44:49 PM Patient called earlier and was instructed to take an extra 20u of lantus and 14 u of novolog around 7pm. Blood sugar is still reading high User: Prescilla Sours, RN Date/Time Eilene Ghazi Time): 05/24/2019 9:54:20 PM Nurse told the granddaughter and the patient that the physician says she needs to be seen in the ER for IV insulin and fluids. Both verbalized understanding. Patient says she does not want to go. Nurse informed patient of possible consequences of not treating her elevated blood sugar.  Patient went to the ED but left without being seen.

## 2019-05-25 NOTE — Telephone Encounter (Signed)
Ok to continue the doses as per hospital d/c and will f/u at appt

## 2019-05-25 NOTE — Telephone Encounter (Signed)
Called pt to verify hosp f/u that's been made fort 05/26/19. Pt states she is aware of the appt. She made appt yesterday. Inform pt had some additional questions concerning d/c completed TCM below.Carolyn [name]  Transition Care Management Follow-up Telephone Call   Date discharged? 05/23/19   How have you been since you were released from the hospital? Pt states she is doing pretty good.. she's just tired    Do you understand why you were in the hospital? YES   Do you understand the discharge instructions? YES   Where were you discharged to? Home   Items Reviewed:  Medications reviewed: YES, she states she is now taking 37 units of Lantus  Allergies reviewed: YES  Dietary changes reviewed: YES, carb modified  Referrals reviewed: YES, still waiting on appt for neurologist   Functional Questionnaire:   Activities of Daily Living (ADLs):   She states she are independent in the following: ambulation, bathing and hygiene, feeding, continence, grooming, toileting and dressing States she doesn't require    Any transportation issues/concerns?: NO   Any patient concerns? NO   Confirmed importance and date/time of follow-up visits scheduled YES, appt 05/26/19 Provider Appointment booked with Dr. Jenny Reichmann  Confirmed with patient if condition begins to worsen call PCP or go to the ER.  Patient was given the office number and encouraged to call back with question or concerns.  : YES

## 2019-05-26 ENCOUNTER — Encounter: Payer: Self-pay | Admitting: Internal Medicine

## 2019-05-26 ENCOUNTER — Ambulatory Visit (INDEPENDENT_AMBULATORY_CARE_PROVIDER_SITE_OTHER): Payer: Medicare Other | Admitting: Internal Medicine

## 2019-05-26 ENCOUNTER — Other Ambulatory Visit: Payer: Self-pay

## 2019-05-26 ENCOUNTER — Other Ambulatory Visit: Payer: Self-pay | Admitting: Internal Medicine

## 2019-05-26 ENCOUNTER — Telehealth: Payer: Self-pay | Admitting: Internal Medicine

## 2019-05-26 ENCOUNTER — Ambulatory Visit: Payer: Medicare Other | Admitting: Internal Medicine

## 2019-05-26 VITALS — BP 136/72 | HR 77 | Temp 97.9°F | Ht 61.0 in | Wt 170.0 lb

## 2019-05-26 DIAGNOSIS — Z794 Long term (current) use of insulin: Secondary | ICD-10-CM | POA: Diagnosis not present

## 2019-05-26 DIAGNOSIS — M5412 Radiculopathy, cervical region: Secondary | ICD-10-CM | POA: Diagnosis not present

## 2019-05-26 DIAGNOSIS — F411 Generalized anxiety disorder: Secondary | ICD-10-CM

## 2019-05-26 DIAGNOSIS — E119 Type 2 diabetes mellitus without complications: Secondary | ICD-10-CM | POA: Diagnosis not present

## 2019-05-26 DIAGNOSIS — I10 Essential (primary) hypertension: Secondary | ICD-10-CM

## 2019-05-26 MED ORDER — HYDROCODONE-ACETAMINOPHEN 5-325 MG PO TABS: 1.0000 | ORAL_TABLET | Freq: Four times a day (QID) | ORAL | 0 refills | Status: DC | PRN

## 2019-05-26 MED ORDER — INSULIN ASPART 100 UNIT/ML ~~LOC~~ SOLN: 5.0000 [IU] | Freq: Three times a day (TID) | SUBCUTANEOUS | 11 refills | Status: AC

## 2019-05-26 MED ORDER — LANTUS SOLOSTAR 100 UNIT/ML ~~LOC~~ SOPN: 50.0000 [IU] | PEN_INJECTOR | Freq: Every day | SUBCUTANEOUS | 11 refills | Status: AC

## 2019-05-26 NOTE — Telephone Encounter (Signed)
° ° °  Amy from [facility] calling for verbal orders and list of sliding scale insulin instructions PT 2x week for 4weeks Nursing  Phone [phone]

## 2019-05-26 NOTE — Telephone Encounter (Signed)
Tried to reach Amy today but voicemail not set up and I am unable to leave a message.
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
## 2019-05-26 NOTE — Patient Instructions (Signed)
Ok to continue the lantus at 50 units and it should not be increased if the AM sugars are in the low 100s  OK to continue the Novolog at 5 units three times daily with meals or use the sliding scale if > 200  Please take all new medication as prescribed - the hydrocodone as needed  Please continue all other medications as before, and refills have been done if requested.  Please have the pharmacy call with any other refills you may need.  Please continue your efforts at being more active, low cholesterol diet, and weight control.  Please keep your appointments with your specialists as you may have planned  Please make an Appointment to return in 6 weeks

## 2019-05-26 NOTE — Telephone Encounter (Signed)
[place] for verbal to PT only  Not ok for verbal on insulin as pt is being seen today for this

## 2019-05-28 ENCOUNTER — Encounter: Payer: Self-pay | Admitting: Internal Medicine

## 2019-05-28 NOTE — Assessment & Plan Note (Signed)
Recent situationally worse, d/w pt to reassure

## 2019-05-28 NOTE — Assessment & Plan Note (Addendum)
Overall stable, to continue lantus at 50 units daily and SSI humalog asd,  to f/u any worsening symptoms or concerns  I spent 32 minutes in preparing to see the patient by review of recent labs, imaging and procedures, obtaining and reviewing separately obtained history, communicating with the patient and family or caregiver, ordering medications, tests or procedures, and documenting clinical information in the EHR including the differential Dx, treatment, and any further evaluation and other management of DM, HTN, anxiety, left cervical radiculopathy

## 2019-05-28 NOTE — Assessment & Plan Note (Signed)
stable overall by history and exam, recent data reviewed with pt, and pt to continue medical treatment as before,  to f/u any worsening symptoms or concerns  

## 2019-05-28 NOTE — Assessment & Plan Note (Signed)
For pain control,  to f/u any worsening symptoms or concerns

## 2019-05-29 ENCOUNTER — Telehealth: Payer: Self-pay

## 2019-05-29 MED ORDER — COOL BLOOD GLUCOSE TEST STRIPS VI STRP: ORAL_STRIP | 1 refills | Status: AC

## 2019-05-29 MED ORDER — LANCETS MISC: 0 refills | Status: AC

## 2019-05-29 NOTE — Telephone Encounter (Deleted)
1.Medication Requested:  2. Pharmacy (Name, Street, [place]):  3. On Med List:   4. Last Visit with PCP:   5. Next visit date with PCP:   Agent: Please be advised that RX refills may take up to 3 business days. We ask that you follow-up with your pharmacy.

## 2019-05-29 NOTE — Telephone Encounter (Signed)
1.Medication Requested:  glucose blood (COOL BLOOD GLUCOSE TEST STRIPS) test strip  Lancets MISC  NOVOLOG FLEXPEN 100 UNIT/ML FlexPen  Tramadol 50 mg prescribe in  2020  2. Pharmacy (Name, Street, City):[id number] - [place], [address]  3. On Med List: Yes   4. Last Visit with PCP: 3.19.21   5. Next visit date with PCP: 4.30.21    Agent: Please be advised that RX refills may take up to 3 business days. We ask that you follow-up with your pharmacy.

## 2019-05-29 NOTE — Telephone Encounter (Signed)
Very sorry, I do not feel comfortable change this narcotic, so please also take the gabapentin  I can try to refer to pain management if she likes,

## 2019-05-29 NOTE — Telephone Encounter (Signed)
Spoke with pt son in [place], he was advised and he verbalized understanding.

## 2019-05-29 NOTE — Telephone Encounter (Signed)
Ok for strips and lancets  No to novolog since already done  No to tramadol since already has hydrocodone

## 2019-05-29 NOTE — Telephone Encounter (Signed)
Patient scheduled to see Dr. Rayann Heman on 06/01/19.

## 2019-05-29 NOTE — Telephone Encounter (Signed)
    Granddaughter Ian Malkin ( not on [place]) calling back , states her father Juanda Crumble wanted her to call office to clarify information given in previous note, because she is the primary caregiver to patient. Scheduler advised [place] to have her father return call for further clarification [place] [phone]

## 2019-05-29 NOTE — Telephone Encounter (Signed)
Patient calling and states that the HYDROcodone-acetaminophen (NORCO/VICODIN) 5-325 MG tablet Is not working. States that it is not strong enough. Would like to know if something else could be called in? Please advise.  CB#: [phone]

## 2019-05-29 NOTE — Telephone Encounter (Deleted)
Error

## 2019-05-30 ENCOUNTER — Telehealth: Payer: Self-pay | Admitting: Radiation Therapy

## 2019-05-30 ENCOUNTER — Telehealth: Payer: Self-pay | Admitting: Internal Medicine

## 2019-05-30 ENCOUNTER — Inpatient Hospital Stay: Payer: Medicare Other | Admitting: Internal Medicine

## 2019-05-30 NOTE — Telephone Encounter (Signed)
New message:   Pt's son in law is calling and ask that we call his daughter Carolyn [name] at [phone] to set up and appt or to discuss what you were calling him for. He asked specifically for [facility]. Call daughter if regarding for an appt but if anything else please call him.  1.Medication Requested: glucose blood (COOL BLOOD GLUCOSE TEST STRIPS) test strip 2. Pharmacy (Name, Street, [place]): [id number] - [place], [address] 3. On Med List: Yes  4. Last Visit with PCP: 05/26/19  5. Next visit date with PCP:07/07/19   Agent: Please be advised that RX refills may take up to 3 business days. We ask that you follow-up with your pharmacy.  He states that he agrees with the Dr that she is just going to have to take more aspirin. He states please do not give her anything else.

## 2019-05-30 NOTE — Telephone Encounter (Addendum)
[name] granddaughter called to say that she will not be able to make it in for her appointment with Dr. Mickeal Skinner today. The patient's daughter was her transportation, but is sick and unable to bring her. The patient is very confused and not doing well per the granddaughter. She said that you will need to call her for details because [name] is not able to answer questions on her own right now.   Granddaughter's name is Herbert Spires, or Victoria= [phone].    This message has been shared with Dr. Mickeal Skinner and his nurse Maudie Mercury.    Mont Dutton R.T.(R)(T)  Radiation Special Procedures Navigator

## 2019-05-31 DIAGNOSIS — I4891 Unspecified atrial fibrillation: Secondary | ICD-10-CM | POA: Diagnosis not present

## 2019-05-31 DIAGNOSIS — K7290 Hepatic failure, unspecified without coma: Secondary | ICD-10-CM | POA: Diagnosis not present

## 2019-05-31 DIAGNOSIS — M47812 Spondylosis without myelopathy or radiculopathy, cervical region: Secondary | ICD-10-CM | POA: Diagnosis not present

## 2019-05-31 DIAGNOSIS — N179 Acute kidney failure, unspecified: Secondary | ICD-10-CM | POA: Diagnosis not present

## 2019-05-31 DIAGNOSIS — K7581 Nonalcoholic steatohepatitis (NASH): Secondary | ICD-10-CM | POA: Diagnosis not present

## 2019-05-31 DIAGNOSIS — E1165 Type 2 diabetes mellitus with hyperglycemia: Secondary | ICD-10-CM | POA: Diagnosis not present

## 2019-05-31 DIAGNOSIS — I428 Other cardiomyopathies: Secondary | ICD-10-CM | POA: Diagnosis not present

## 2019-05-31 DIAGNOSIS — D630 Anemia in neoplastic disease: Secondary | ICD-10-CM | POA: Diagnosis not present

## 2019-05-31 DIAGNOSIS — I447 Left bundle-branch block, unspecified: Secondary | ICD-10-CM | POA: Diagnosis not present

## 2019-05-31 DIAGNOSIS — G8929 Other chronic pain: Secondary | ICD-10-CM | POA: Diagnosis not present

## 2019-05-31 DIAGNOSIS — M4802 Spinal stenosis, cervical region: Secondary | ICD-10-CM | POA: Diagnosis not present

## 2019-05-31 DIAGNOSIS — K7469 Other cirrhosis of liver: Secondary | ICD-10-CM | POA: Diagnosis not present

## 2019-05-31 DIAGNOSIS — I509 Heart failure, unspecified: Secondary | ICD-10-CM | POA: Diagnosis not present

## 2019-05-31 DIAGNOSIS — C3491 Malignant neoplasm of unspecified part of right bronchus or lung: Secondary | ICD-10-CM | POA: Diagnosis not present

## 2019-05-31 DIAGNOSIS — I700 Atherosclerosis of aorta: Secondary | ICD-10-CM | POA: Diagnosis not present

## 2019-05-31 DIAGNOSIS — I499 Cardiac arrhythmia, unspecified: Secondary | ICD-10-CM | POA: Diagnosis not present

## 2019-05-31 DIAGNOSIS — D473 Essential (hemorrhagic) thrombocythemia: Secondary | ICD-10-CM | POA: Diagnosis not present

## 2019-05-31 DIAGNOSIS — M5030 Other cervical disc degeneration, unspecified cervical region: Secondary | ICD-10-CM | POA: Diagnosis not present

## 2019-05-31 DIAGNOSIS — J439 Emphysema, unspecified: Secondary | ICD-10-CM | POA: Diagnosis not present

## 2019-05-31 DIAGNOSIS — I110 Hypertensive heart disease with heart failure: Secondary | ICD-10-CM | POA: Diagnosis not present

## 2019-05-31 DIAGNOSIS — M4726 Other spondylosis with radiculopathy, lumbar region: Secondary | ICD-10-CM | POA: Diagnosis not present

## 2019-05-31 DIAGNOSIS — M47815 Spondylosis without myelopathy or radiculopathy, thoracolumbar region: Secondary | ICD-10-CM | POA: Diagnosis not present

## 2019-05-31 DIAGNOSIS — C7931 Secondary malignant neoplasm of brain: Secondary | ICD-10-CM | POA: Diagnosis not present

## 2019-05-31 DIAGNOSIS — M2578 Osteophyte, vertebrae: Secondary | ICD-10-CM | POA: Diagnosis not present

## 2019-06-01 ENCOUNTER — Ambulatory Visit (INDEPENDENT_AMBULATORY_CARE_PROVIDER_SITE_OTHER): Payer: Medicare Other | Admitting: Internal Medicine

## 2019-06-01 ENCOUNTER — Other Ambulatory Visit: Payer: Self-pay

## 2019-06-01 ENCOUNTER — Encounter: Payer: Self-pay | Admitting: Internal Medicine

## 2019-06-01 VITALS — BP 130/66 | HR 86 | Ht 61.0 in | Wt 173.0 lb

## 2019-06-01 DIAGNOSIS — Z95810 Presence of automatic (implantable) cardiac defibrillator: Secondary | ICD-10-CM

## 2019-06-01 DIAGNOSIS — I10 Essential (primary) hypertension: Secondary | ICD-10-CM

## 2019-06-01 DIAGNOSIS — Z720 Tobacco use: Secondary | ICD-10-CM | POA: Diagnosis not present

## 2019-06-01 DIAGNOSIS — I519 Heart disease, unspecified: Secondary | ICD-10-CM

## 2019-06-01 DIAGNOSIS — I428 Other cardiomyopathies: Secondary | ICD-10-CM | POA: Diagnosis not present

## 2019-06-01 DIAGNOSIS — I480 Paroxysmal atrial fibrillation: Secondary | ICD-10-CM

## 2019-06-01 LAB — CUP PACEART INCLINIC DEVICE CHECK
Battery Remaining Longevity: 36 mo
Brady Statistic RA Percent Paced: 0.6 %
Brady Statistic RV Percent Paced: 93 %
Date Time Interrogation Session: 20210325134541
HighPow Impedance: 66.375
Implantable Lead Implant Date: 20100930
Implantable Lead Implant Date: 20100930
Implantable Lead Implant Date: 20100930
Implantable Lead Location: 753858
Implantable Lead Location: 753859
Implantable Lead Location: 753860
Implantable Lead Model: 7122
Implantable Pulse Generator Implant Date: 20161215
Lead Channel Impedance Value: 325 Ohm
Lead Channel Impedance Value: 512.5 Ohm
Lead Channel Impedance Value: 662.5 Ohm
Lead Channel Pacing Threshold Amplitude: 0.5 V
Lead Channel Pacing Threshold Amplitude: 0.5 V
Lead Channel Pacing Threshold Amplitude: 0.75 V
Lead Channel Pacing Threshold Amplitude: 0.75 V
Lead Channel Pacing Threshold Amplitude: 0.75 V
Lead Channel Pacing Threshold Amplitude: 0.75 V
Lead Channel Pacing Threshold Pulse Width: 0.5 ms
Lead Channel Pacing Threshold Pulse Width: 0.5 ms
Lead Channel Pacing Threshold Pulse Width: 0.5 ms
Lead Channel Pacing Threshold Pulse Width: 0.5 ms
Lead Channel Pacing Threshold Pulse Width: 0.5 ms
Lead Channel Pacing Threshold Pulse Width: 0.5 ms
Lead Channel Sensing Intrinsic Amplitude: 1.4 mV
Lead Channel Sensing Intrinsic Amplitude: 11.6 mV
Lead Channel Setting Pacing Amplitude: 2 V
Lead Channel Setting Pacing Amplitude: 2 V
Lead Channel Setting Pacing Amplitude: 2.5 V
Lead Channel Setting Pacing Pulse Width: 0.5 ms
Lead Channel Setting Pacing Pulse Width: 0.5 ms
Lead Channel Setting Sensing Sensitivity: 0.5 mV
Pulse Gen Serial Number: 7247188

## 2019-06-01 MED ORDER — METOPROLOL TARTRATE 50 MG PO TABS: 50.0000 mg | ORAL_TABLET | Freq: Two times a day (BID) | ORAL | 3 refills | Status: AC
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
## 2019-06-01 NOTE — Patient Instructions (Addendum)
Medication Instructions:  Your physician has recommended you make the following change in your medication:   1.  STOP taking aspirin  2.  INcrease your metoprolol tartrate--Take 50 mg by mouth twice a day  Labwork: None ordered.  Testing/Procedures: None ordered.  Follow-Up: Your physician wants you to follow-up in: 3 months with Tommye Standard, PA. You will receive a reminder letter in the mail two months in advance. If you don't receive a letter, please call our office to schedule the follow-up appointment.   Your physician wants you to follow-up in: 6 months with Dr. Rayann Heman.   You will receive a reminder letter in the mail two months in advance. If you don't receive a letter, please call our office to schedule the follow-up appointment.  Remote monitoring is used to monitor your ICD from home. This monitoring reduces the number of office visits required to check your device to one time per year. It allows [place] to keep an eye on the functioning of your device to ensure it is working properly. You are scheduled for a device check from home on 06/06/2019. You may send your transmission at any time that day. If you have a wireless device, the transmission will be sent automatically. After your physician reviews your transmission, you will receive a postcard with your next transmission date.  Any Other Special Instructions Will Be Listed Below (If Applicable).  If you need a refill on your cardiac medications before your next appointment, please call your pharmacy.

## 2019-06-02 ENCOUNTER — Inpatient Hospital Stay (HOSPITAL_BASED_OUTPATIENT_CLINIC_OR_DEPARTMENT_OTHER): Payer: Medicare Other | Admitting: Internal Medicine

## 2019-06-02 ENCOUNTER — Telehealth: Payer: Self-pay | Admitting: Internal Medicine

## 2019-06-02 ENCOUNTER — Telehealth: Payer: Self-pay

## 2019-06-02 ENCOUNTER — Other Ambulatory Visit: Payer: Self-pay

## 2019-06-02 VITALS — BP 153/52 | HR 82 | Temp 98.7°F | Resp 20 | Ht 61.0 in | Wt 175.0 lb

## 2019-06-02 DIAGNOSIS — Z794 Long term (current) use of insulin: Secondary | ICD-10-CM | POA: Diagnosis not present

## 2019-06-02 DIAGNOSIS — F17210 Nicotine dependence, cigarettes, uncomplicated: Secondary | ICD-10-CM | POA: Diagnosis not present

## 2019-06-02 DIAGNOSIS — C7931 Secondary malignant neoplasm of brain: Secondary | ICD-10-CM | POA: Diagnosis not present

## 2019-06-02 DIAGNOSIS — R531 Weakness: Secondary | ICD-10-CM | POA: Diagnosis not present

## 2019-06-02 DIAGNOSIS — C3491 Malignant neoplasm of unspecified part of right bronchus or lung: Secondary | ICD-10-CM | POA: Diagnosis not present

## 2019-06-02 DIAGNOSIS — F349 Persistent mood [affective] disorder, unspecified: Secondary | ICD-10-CM | POA: Diagnosis not present
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
## 2019-06-02 NOTE — Telephone Encounter (Signed)
Faxed and confirmed signed orders to [facility]

## 2019-06-02 NOTE — Telephone Encounter (Signed)
Scheduled per los. Gave avs and calendar  

## 2019-06-02 NOTE — Telephone Encounter (Signed)
   Patient and her granddaughter Carolyn [name] calling on the line as well. Requesting refill for HYDROcodone-acetaminophen (NORCO/VICODIN) 5-325 MG tablet Patient states she takes 4 pills a day  1.Medication Requested:HYDROcodone-acetaminophen (NORCO/VICODIN) 5-325 MG tablet  2. Pharmacy (Name, Street, City):[id number] - [place], [place].  3. On Med List: yes  4. Last Visit with PCP: 05/26/19  5. Next visit date with PCP:07/07/19   Agent: Please be advised that RX refills may take up to 3 business days. We ask that you follow-up with your pharmacy.

## 2019-06-05 ENCOUNTER — Other Ambulatory Visit: Payer: Self-pay

## 2019-06-05 ENCOUNTER — Ambulatory Visit (INDEPENDENT_AMBULATORY_CARE_PROVIDER_SITE_OTHER): Payer: Medicare Other | Admitting: Podiatry

## 2019-06-05 ENCOUNTER — Encounter: Payer: Self-pay | Admitting: Podiatry

## 2019-06-05 VITALS — Temp 97.7°F

## 2019-06-05 DIAGNOSIS — E119 Type 2 diabetes mellitus without complications: Secondary | ICD-10-CM

## 2019-06-05 DIAGNOSIS — M79674 Pain in right toe(s): Secondary | ICD-10-CM | POA: Diagnosis not present

## 2019-06-05 DIAGNOSIS — M79675 Pain in left toe(s): Secondary | ICD-10-CM

## 2019-06-05 DIAGNOSIS — B351 Tinea unguium: Secondary | ICD-10-CM | POA: Diagnosis not present

## 2019-06-05 DIAGNOSIS — Z794 Long term (current) use of insulin: Secondary | ICD-10-CM | POA: Diagnosis not present

## 2019-06-05 MED ORDER — HYDROCODONE-ACETAMINOPHEN 5-325 MG PO TABS: 1.0000 | ORAL_TABLET | Freq: Four times a day (QID) | ORAL | 0 refills | Status: AC | PRN

## 2019-06-05 NOTE — Telephone Encounter (Signed)
Called patient to discuss Hydrocodone.  Patient wants another prescription for Hydrocodone,  she also said that she had taken Gabapentin for 7 or 8 months. Discussed pain medication clinic please send referral

## 2019-06-05 NOTE — Telephone Encounter (Signed)
   Patient requesting call to discuss Hydrocodone

## 2019-06-05 NOTE — Telephone Encounter (Signed)
Sorry, on review the last refill was only for 1 wk  Ok for repeat refill but one month only, ok for pain clinic referral if she likes since I do not treat hydrocodone for long term

## 2019-06-05 NOTE — Telephone Encounter (Signed)
Not sure why I keep getting refill reqeusts for med already refilled   This one was already done mar 19 and no early refills  I do not tx with hydrocodone long term  I will refer to pain clinic if she likes

## 2019-06-05 NOTE — Telephone Encounter (Signed)
See below

## 2019-06-05 NOTE — Addendum Note (Signed)
Addended by: Biagio Borg on: 06/05/2019 09:43 PM   Modules accepted: Orders

## 2019-06-05 NOTE — Patient Instructions (Signed)
Purchase Tea Tree oil and apply a small amount to toenails daily.  Diabetes Mellitus and Foot Care Foot care is an important part of your health, especially when you have diabetes. Diabetes may cause you to have problems because of poor blood flow (circulation) to your feet and legs, which can cause your skin to:  Become thinner and drier.  Break more easily.  Heal more slowly.  Peel and crack. You may also have nerve damage (neuropathy) in your legs and feet, causing decreased feeling in them. This means that you may not notice minor injuries to your feet that could lead to more serious problems. Noticing and addressing any potential problems early is the best way to prevent future foot problems. How to care for your feet Foot hygiene  Wash your feet daily with warm water and mild soap. Do not use hot water. Then, pat your feet and the areas between your toes until they are completely dry. Do not soak your feet as this can dry your skin.  Trim your toenails straight across. Do not dig under them or around the cuticle. File the edges of your nails with an emery board or nail file.  Apply a moisturizing lotion or petroleum jelly to the skin on your feet and to dry, brittle toenails. Use lotion that does not contain alcohol and is unscented. Do not apply lotion between your toes. Shoes and socks  Wear clean socks or stockings every day. Make sure they are not too tight. Do not wear knee-high stockings since they may decrease blood flow to your legs.  Wear shoes that fit properly and have enough cushioning. Always look in your shoes before you put them on to be sure there are no objects inside.  To break in new shoes, wear them for just a few hours a day. This prevents injuries on your feet. Wounds, scrapes, corns, and calluses  Check your feet daily for blisters, cuts, bruises, sores, and redness. If you cannot see the bottom of your feet, use a mirror or ask someone for help.  Do not  cut corns or calluses or try to remove them with medicine.  If you find a minor scrape, cut, or break in the skin on your feet, keep it and the skin around it clean and dry. You may clean these areas with mild soap and water. Do not clean the area with peroxide, alcohol, or iodine.  If you have a wound, scrape, corn, or callus on your foot, look at it several times a day to make sure it is healing and not infected. Check for: ? Redness, swelling, or pain. ? Fluid or blood. ? Warmth. ? Pus or a bad smell. General instructions  Do not cross your legs. This may decrease blood flow to your feet.  Do not use heating pads or hot water bottles on your feet. They may burn your skin. If you have lost feeling in your feet or legs, you may not know this is happening until it is too late.  Protect your feet from hot and cold by wearing shoes, such as at the beach or on hot pavement.  Schedule a complete foot exam at least once a year (annually) or more often if you have foot problems. If you have foot problems, report any cuts, sores, or bruises to your health care provider immediately. Contact a health care provider if:  You have a medical condition that increases your risk of infection and you have any cuts, sores,  or bruises on your feet.  You have an injury that is not healing.  You have redness on your legs or feet.  You feel burning or tingling in your legs or feet.  You have pain or cramps in your legs and feet.  Your legs or feet are numb.  Your feet always feel cold.  You have pain around a toenail. Get help right away if:  You have a wound, scrape, corn, or callus on your foot and: ? You have pain, swelling, or redness that gets worse. ? You have fluid or blood coming from the wound, scrape, corn, or callus. ? Your wound, scrape, corn, or callus feels warm to the touch. ? You have pus or a bad smell coming from the wound, scrape, corn, or callus. ? You have a fever. ? You  have a red line going up your leg. Summary  Check your feet every day for cuts, sores, red spots, swelling, and blisters.  Moisturize feet and legs daily.  Wear shoes that fit properly and have enough cushioning.  If you have foot problems, report any cuts, sores, or bruises to your health care provider immediately.  Schedule a complete foot exam at least once a year (annually) or more often if you have foot problems. This information is not intended to replace advice given to you by your health care provider. Make sure you discuss any questions you have with your health care provider. Document Revised: 11/16/2018 Document Reviewed: 03/27/2016 Elsevier Patient Education  [place]? An infection that lies within the keratin of your nail plate that is caused by a fungus.  WHY ME? Fungal infections affect all ages, sexes, races, and creeds.  There may be many factors that predispose you to a fungal infection such as age, coexisting medical conditions such as diabetes, or an autoimmune disease; stress, medications, fatigue, genetics, etc.  Bottom line: fungus thrives in a warm, moist environment and your shoes offer such a location.  IS IT CONTAGIOUS? Theoretically, yes.  You do not want to share shoes, nail clippers or files with someone who has fungal toenails.  Walking around barefoot in the same room or sleeping in the same bed is unlikely to transfer the organism.  It is important to realize, however, that fungus can spread easily from one nail to the next on the same foot.  HOW DO WE TREAT THIS?  There are several ways to treat this condition.  Treatment may depend on many factors such as age, medications, pregnancy, liver and kidney conditions, etc.  It is best to ask your doctor which options are available to you.  5. No treatment.   Unlike many other medical concerns, you can live with this condition.  However for many people this  can be a painful condition and may lead to ingrown toenails or a bacterial infection.  It is recommended that you keep the nails cut short to help reduce the amount of fungal nail. 6. Topical treatment.  These range from herbal remedies to prescription strength nail lacquers.  About 40-50% effective, topicals require twice daily application for approximately 9 to 12 months or until an entirely new nail has grown out.  The most effective topicals are medical grade medications available through physicians offices. 7. Oral antifungal medications.  With an 80-90% cure rate, the most common oral medication requires 3 to 4 months of therapy and stays in your system for a year as the new  nail grows out.  Oral antifungal medications do require blood work to make sure it is a safe drug for you.  A liver function panel will be performed prior to starting the medication and after the first month of treatment.  It is important to have the blood work performed to avoid any harmful side effects.  In general, this medication safe but blood work is required. 8. Laser Therapy.  This treatment is performed by applying a specialized laser to the affected nail plate.  This therapy is noninvasive, fast, and non-painful.  It is not covered by insurance and is therefore, out of pocket.  The results have been very good with a 80-95% cure rate.  [facility] is the only practice in the area to offer this therapy. 9. Permanent Nail Avulsion.  Removing the entire nail so that a new nail will not grow back.

## 2019-06-06 ENCOUNTER — Encounter: Payer: Self-pay | Admitting: Internal Medicine

## 2019-06-06 ENCOUNTER — Ambulatory Visit (INDEPENDENT_AMBULATORY_CARE_PROVIDER_SITE_OTHER): Payer: Medicare Other

## 2019-06-06 DIAGNOSIS — Z95810 Presence of automatic (implantable) cardiac defibrillator: Secondary | ICD-10-CM

## 2019-06-06 DIAGNOSIS — I5032 Chronic diastolic (congestive) heart failure: Secondary | ICD-10-CM | POA: Diagnosis not present

## 2019-06-07 ENCOUNTER — Telehealth: Payer: Self-pay | Admitting: Medical Oncology

## 2019-06-07 ENCOUNTER — Telehealth: Payer: Self-pay | Admitting: *Deleted
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
## 2019-06-07 NOTE — Telephone Encounter (Signed)
F/U from son-in-law., Juanda Crumble. Per "Jerris's wishes , Dr Rayann Heman turned off defibrillator on 06/01/19". Pt is keeping appts .

## 2019-06-07 NOTE — Telephone Encounter (Signed)
Called patients son in law to advise that we received his message and that I would route it to Dr. Mickeal Skinner to call him back to discuss his questions.

## 2019-06-09 NOTE — Progress Notes (Signed)
EPIC Encounter for ICM Monitoring  Patient Name: Carolyn [name] is a 79 y.o. female Date: 06/09/2019 Primary Care Physican: Biagio Borg, MD Primary Cardiologist: Angelena Form Electrophysiologist: Allred Bi-V Pacing: >99%% Weight:166lbs   Transmission reviewed.Per Dr Jackalyn Lombard OV note, patient has lung CA with mets to brain.  ICD therapy has been turned off.  CorVueThoracic impedancenormal.  Prescribed:Furosemide 20 mgtake1tablet daily.   Labs: 03/20/2019 Creatinine 1.15, BUN 30, Potassium 5.4, Sodium 35, GFR 46-53 03/13/2019 Creatinine 0.95, BUN 25, Potassium 4.8, Sodium 138  03/01/2019 Creatinine 0.84, BUN 13, Potassium 3.6, Sodium 140, GFR >60  02/28/2019 Creatinine 1.17, BUN 13, Potassium 4.0, Sodium 139, GFR 45-52  02/22/2019 Creatinine 0.95, BUN 17, Potassium 3.5, Sodium 138, GFR 58->60  02/06/2019 Creatinine 0.84, BUN 21, Potassium 4.2, Sodium 137, GFR 65.71 11/04/2018 Creatinine0.86, BUN21, Potassium3.8, Sodium137, GFR>60 10/27/2018 Creatinine0.81, BUN14, Potassium4.1, Sodium137, GFR>60  10/26/2018 Creatinine0.87, BUN12, Potassium4.0, Sodium137, GFR>60  10/25/2018 Creatinine0.84, BUN9, Potassium3.9, Sodium140, GFR>60  A complete set of results can be found in Results Review.  Recommendations:None  Follow-up plan: No further ICM follow up for patient due to terminal illness. 91 day device clinic remote transmission 07/17/2019.   Copy of ICM check sent to Dr.Allred.  3 month ICM trend: 06/05/2019    1 Year ICM trend:       Rosalene Billings, RN 06/09/2019 4:15 PM

## 2019-06-15 ENCOUNTER — Inpatient Hospital Stay: Payer: Medicaid Other | Attending: Internal Medicine | Admitting: Internal Medicine

## 2019-06-15 DIAGNOSIS — R231 Pallor: Secondary | ICD-10-CM | POA: Diagnosis not present

## 2019-06-15 DIAGNOSIS — C7931 Secondary malignant neoplasm of brain: Secondary | ICD-10-CM

## 2019-06-15 DIAGNOSIS — R404 Transient alteration of awareness: Secondary | ICD-10-CM | POA: Diagnosis not present

## 2019-06-15 DIAGNOSIS — E1165 Type 2 diabetes mellitus with hyperglycemia: Secondary | ICD-10-CM | POA: Diagnosis not present

## 2019-06-15 DIAGNOSIS — E162 Hypoglycemia, unspecified: Secondary | ICD-10-CM | POA: Diagnosis not present

## 2019-06-15 DIAGNOSIS — E161 Other hypoglycemia: Secondary | ICD-10-CM | POA: Diagnosis not present

## 2019-06-15 MED ORDER — DEXAMETHASONE 1 MG PO TABS: 1.0000 mg | ORAL_TABLET | Freq: Every day | ORAL | 0 refills | Status: AC

## 2019-06-15 NOTE — Progress Notes (Signed)
I connected with Carolyn [name] on 06/15/19 at 12:30 PM EDT by telephone visit and verified that I am speaking with the correct person using two identifiers.  I discussed the limitations, risks, security and privacy concerns of performing an evaluation and management service by telemedicine and the availability of in-person appointments. I also discussed with the patient that there may be a patient responsible charge related to this service. The patient expressed understanding and agreed to proceed.  Other persons participating in the visit and their role in the encounter:  Daughter  Patient's location:  Home  Provider's location:  Office  Chief Complaint:  Brain Metastases, Focal Seizures  History of Present Ilness: Carolyn [name] reports no changes with the lower dose of decadron (2mg ).  She continues to be somewhat anxious and moody.  No recurrence of seizures.   Observations: Language and cognition stable Assessment and Plan: Decrease decadron to 1mg  daily until next in person visit Follow Up Instructions: Follow up as scheduled on 07/11/19 following next CT head  I discussed the assessment and treatment plan with the patient.  The patient was provided an opportunity to ask questions and all were answered.  The patient agreed with the plan and demonstrated understanding of the instructions.    The patient was advised to call back or seek an in-person evaluation if the symptoms worsen or if the condition fails to improve as anticipated.  I provided 5-10 minutes of non-face-to-face time during this enocunter.  Ventura Sellers, MD   I provided 15 minutes of non face-to-face telephone visit time during this encounter, and > 50% was spent counseling as documented under my assessment & plan.

## 2019-06-16 ENCOUNTER — Other Ambulatory Visit: Payer: Medicare Other

## 2019-06-16 ENCOUNTER — Ambulatory Visit (HOSPITAL_COMMUNITY): Payer: Medicare HMO

## 2019-06-19 ENCOUNTER — Ambulatory Visit: Payer: Medicare Other | Admitting: Internal Medicine

## 2019-06-19 ENCOUNTER — Telehealth: Payer: Self-pay | Admitting: *Deleted

## 2019-06-19 NOTE — Telephone Encounter (Signed)
Patients granddaughter called to report that she hasn't picked up the new Decadron dose yet at pharmacy, she is the primary caregiver.  She states she put 3 weeks worth of medicine in her medication holder already with higher dose of Decadron.  She states that she worries she might have covid and is pending a test and wont be able to correct the medicine in the pill box until she is certain that she doesn't have covid.

## 2019-06-23 ENCOUNTER — Telehealth: Payer: Self-pay

## 2019-06-23 NOTE — Telephone Encounter (Signed)
Hydrocodone declined as too soon  I cannot support use of pain medication more than prescribed

## 2019-06-26 ENCOUNTER — Telehealth: Payer: Self-pay | Admitting: Radiation Oncology

## 2019-06-28 ENCOUNTER — Ambulatory Visit: Payer: Medicare Other | Admitting: Neurology

## 2019-06-29 ENCOUNTER — Encounter (HOSPITAL_COMMUNITY): Payer: Self-pay | Admitting: Emergency Medicine

## 2019-06-29 ENCOUNTER — Other Ambulatory Visit: Payer: Self-pay

## 2019-06-29 ENCOUNTER — Inpatient Hospital Stay (HOSPITAL_COMMUNITY)
Admission: EM | Admit: 2019-06-29 | Discharge: 2019-07-08 | DRG: 177 | Disposition: E | Payer: Medicare HMO | Attending: Internal Medicine | Admitting: Internal Medicine

## 2019-06-29 ENCOUNTER — Emergency Department (HOSPITAL_COMMUNITY): Payer: Medicare HMO

## 2019-06-29 DIAGNOSIS — I110 Hypertensive heart disease with heart failure: Secondary | ICD-10-CM | POA: Diagnosis present

## 2019-06-29 DIAGNOSIS — J9690 Respiratory failure, unspecified, unspecified whether with hypoxia or hypercapnia: Secondary | ICD-10-CM | POA: Diagnosis not present

## 2019-06-29 DIAGNOSIS — A0839 Other viral enteritis: Secondary | ICD-10-CM | POA: Diagnosis present

## 2019-06-29 DIAGNOSIS — G4700 Insomnia, unspecified: Secondary | ICD-10-CM | POA: Diagnosis present

## 2019-06-29 DIAGNOSIS — Z66 Do not resuscitate: Secondary | ICD-10-CM | POA: Diagnosis present

## 2019-06-29 DIAGNOSIS — E8809 Other disorders of plasma-protein metabolism, not elsewhere classified: Secondary | ICD-10-CM | POA: Diagnosis not present

## 2019-06-29 DIAGNOSIS — G8929 Other chronic pain: Secondary | ICD-10-CM | POA: Diagnosis present

## 2019-06-29 DIAGNOSIS — Z8719 Personal history of other diseases of the digestive system: Secondary | ICD-10-CM

## 2019-06-29 DIAGNOSIS — Z833 Family history of diabetes mellitus: Secondary | ICD-10-CM

## 2019-06-29 DIAGNOSIS — I499 Cardiac arrhythmia, unspecified: Secondary | ICD-10-CM | POA: Diagnosis not present

## 2019-06-29 DIAGNOSIS — Z90710 Acquired absence of both cervix and uterus: Secondary | ICD-10-CM

## 2019-06-29 DIAGNOSIS — Z8042 Family history of malignant neoplasm of prostate: Secondary | ICD-10-CM

## 2019-06-29 DIAGNOSIS — C7931 Secondary malignant neoplasm of brain: Secondary | ICD-10-CM | POA: Diagnosis present

## 2019-06-29 DIAGNOSIS — K219 Gastro-esophageal reflux disease without esophagitis: Secondary | ICD-10-CM | POA: Diagnosis present

## 2019-06-29 DIAGNOSIS — N179 Acute kidney failure, unspecified: Secondary | ICD-10-CM | POA: Diagnosis present

## 2019-06-29 DIAGNOSIS — I447 Left bundle-branch block, unspecified: Secondary | ICD-10-CM | POA: Diagnosis present

## 2019-06-29 DIAGNOSIS — U071 COVID-19: Secondary | ICD-10-CM | POA: Diagnosis not present

## 2019-06-29 DIAGNOSIS — E039 Hypothyroidism, unspecified: Secondary | ICD-10-CM | POA: Diagnosis present

## 2019-06-29 DIAGNOSIS — G9341 Metabolic encephalopathy: Secondary | ICD-10-CM | POA: Diagnosis not present

## 2019-06-29 DIAGNOSIS — I428 Other cardiomyopathies: Secondary | ICD-10-CM | POA: Diagnosis not present

## 2019-06-29 DIAGNOSIS — J440 Chronic obstructive pulmonary disease with acute lower respiratory infection: Secondary | ICD-10-CM | POA: Diagnosis not present

## 2019-06-29 DIAGNOSIS — E162 Hypoglycemia, unspecified: Secondary | ICD-10-CM | POA: Diagnosis not present

## 2019-06-29 DIAGNOSIS — E875 Hyperkalemia: Secondary | ICD-10-CM | POA: Diagnosis present

## 2019-06-29 DIAGNOSIS — I4891 Unspecified atrial fibrillation: Secondary | ICD-10-CM | POA: Diagnosis not present

## 2019-06-29 DIAGNOSIS — Z6833 Body mass index (BMI) 33.0-33.9, adult: Secondary | ICD-10-CM

## 2019-06-29 DIAGNOSIS — G40909 Epilepsy, unspecified, not intractable, without status epilepticus: Secondary | ICD-10-CM | POA: Diagnosis present

## 2019-06-29 DIAGNOSIS — Z79890 Hormone replacement therapy (postmenopausal): Secondary | ICD-10-CM

## 2019-06-29 DIAGNOSIS — I5022 Chronic systolic (congestive) heart failure: Secondary | ICD-10-CM

## 2019-06-29 DIAGNOSIS — E785 Hyperlipidemia, unspecified: Secondary | ICD-10-CM | POA: Diagnosis present

## 2019-06-29 DIAGNOSIS — J9601 Acute respiratory failure with hypoxia: Secondary | ICD-10-CM | POA: Diagnosis present

## 2019-06-29 DIAGNOSIS — J1282 Pneumonia due to coronavirus disease 2019: Secondary | ICD-10-CM | POA: Diagnosis present

## 2019-06-29 DIAGNOSIS — Z515 Encounter for palliative care: Secondary | ICD-10-CM | POA: Diagnosis not present

## 2019-06-29 DIAGNOSIS — E871 Hypo-osmolality and hyponatremia: Secondary | ICD-10-CM | POA: Diagnosis present

## 2019-06-29 DIAGNOSIS — Z87442 Personal history of urinary calculi: Secondary | ICD-10-CM

## 2019-06-29 DIAGNOSIS — F17210 Nicotine dependence, cigarettes, uncomplicated: Secondary | ICD-10-CM | POA: Diagnosis present

## 2019-06-29 DIAGNOSIS — Z905 Acquired absence of kidney: Secondary | ICD-10-CM

## 2019-06-29 DIAGNOSIS — Z79899 Other long term (current) drug therapy: Secondary | ICD-10-CM

## 2019-06-29 DIAGNOSIS — Z87898 Personal history of other specified conditions: Secondary | ICD-10-CM | POA: Diagnosis not present

## 2019-06-29 DIAGNOSIS — Z794 Long term (current) use of insulin: Secondary | ICD-10-CM

## 2019-06-29 DIAGNOSIS — Z743 Need for continuous supervision: Secondary | ICD-10-CM | POA: Diagnosis not present

## 2019-06-29 DIAGNOSIS — M5416 Radiculopathy, lumbar region: Secondary | ICD-10-CM | POA: Diagnosis present

## 2019-06-29 DIAGNOSIS — Z85118 Personal history of other malignant neoplasm of bronchus and lung: Secondary | ICD-10-CM

## 2019-06-29 DIAGNOSIS — Z885 Allergy status to narcotic agent status: Secondary | ICD-10-CM

## 2019-06-29 DIAGNOSIS — F419 Anxiety disorder, unspecified: Secondary | ICD-10-CM | POA: Diagnosis present

## 2019-06-29 DIAGNOSIS — M1990 Unspecified osteoarthritis, unspecified site: Secondary | ICD-10-CM | POA: Diagnosis present

## 2019-06-29 DIAGNOSIS — Z888 Allergy status to other drugs, medicaments and biological substances status: Secondary | ICD-10-CM

## 2019-06-29 DIAGNOSIS — K589 Irritable bowel syndrome without diarrhea: Secondary | ICD-10-CM | POA: Diagnosis present

## 2019-06-29 DIAGNOSIS — Z8249 Family history of ischemic heart disease and other diseases of the circulatory system: Secondary | ICD-10-CM

## 2019-06-29 DIAGNOSIS — Z95810 Presence of automatic (implantable) cardiac defibrillator: Secondary | ICD-10-CM | POA: Diagnosis not present

## 2019-06-29 DIAGNOSIS — R0602 Shortness of breath: Secondary | ICD-10-CM | POA: Diagnosis not present

## 2019-06-29 DIAGNOSIS — Z87440 Personal history of urinary (tract) infections: Secondary | ICD-10-CM

## 2019-06-29 DIAGNOSIS — Z882 Allergy status to sulfonamides status: Secondary | ICD-10-CM

## 2019-06-29 DIAGNOSIS — E11649 Type 2 diabetes mellitus with hypoglycemia without coma: Secondary | ICD-10-CM | POA: Diagnosis present

## 2019-06-29 DIAGNOSIS — Z79891 Long term (current) use of opiate analgesic: Secondary | ICD-10-CM

## 2019-06-29 DIAGNOSIS — F329 Major depressive disorder, single episode, unspecified: Secondary | ICD-10-CM | POA: Diagnosis present

## 2019-06-29 DIAGNOSIS — Z881 Allergy status to other antibiotic agents status: Secondary | ICD-10-CM

## 2019-06-29 DIAGNOSIS — Z7952 Long term (current) use of systemic steroids: Secondary | ICD-10-CM

## 2019-06-29 DIAGNOSIS — E161 Other hypoglycemia: Secondary | ICD-10-CM | POA: Diagnosis not present

## 2019-06-29 LAB — POCT I-STAT 7, (LYTES, BLD GAS, ICA,H+H)
Acid-base deficit: 4 mmol/L — ABNORMAL HIGH (ref 0.0–2.0)
Bicarbonate: 18.8 mmol/L — ABNORMAL LOW (ref 20.0–28.0)
Calcium, Ion: 1.17 mmol/L (ref 1.15–1.40)
HCT: 36 % (ref 36.0–46.0)
Hemoglobin: 12.2 g/dL (ref 12.0–15.0)
O2 Saturation: 98 %
Patient temperature: 98
Potassium: 3.4 mmol/L — ABNORMAL LOW (ref 3.5–5.1)
Sodium: 138 mmol/L (ref 135–145)
TCO2: 20 mmol/L — ABNORMAL LOW (ref 22–32)
pCO2 arterial: 27.5 mmHg — ABNORMAL LOW (ref 32.0–48.0)
pH, Arterial: 7.443 (ref 7.350–7.450)
pO2, Arterial: 92 mmHg (ref 83.0–108.0)

## 2019-06-29 LAB — CBC WITH DIFFERENTIAL/PLATELET
Abs Immature Granulocytes: 0.05 10*3/uL (ref 0.00–0.07)
Basophils Absolute: 0 10*3/uL (ref 0.0–0.1)
Basophils Relative: 0 %
Eosinophils Absolute: 0 10*3/uL (ref 0.0–0.5)
Eosinophils Relative: 0 %
HCT: 40.5 % (ref 36.0–46.0)
Hemoglobin: 12.9 g/dL (ref 12.0–15.0)
Immature Granulocytes: 1 %
Lymphocytes Relative: 12 %
Lymphs Abs: 1.2 10*3/uL (ref 0.7–4.0)
MCH: 28.5 pg (ref 26.0–34.0)
MCHC: 31.9 g/dL (ref 30.0–36.0)
MCV: 89.6 fL (ref 80.0–100.0)
Monocytes Absolute: 0.7 10*3/uL (ref 0.1–1.0)
Monocytes Relative: 7 %
Neutro Abs: 8.2 10*3/uL — ABNORMAL HIGH (ref 1.7–7.7)
Neutrophils Relative %: 80 %
Platelets: 328 10*3/uL (ref 150–400)
RBC: 4.52 MIL/uL (ref 3.87–5.11)
RDW: 15.9 % — ABNORMAL HIGH (ref 11.5–15.5)
WBC: 10.2 10*3/uL (ref 4.0–10.5)
nRBC: 0 % (ref 0.0–0.2)

## 2019-06-29 LAB — RESPIRATORY PANEL BY RT PCR (FLU A&B, COVID)
Influenza A by PCR: NEGATIVE
Influenza B by PCR: NEGATIVE
SARS Coronavirus 2 by RT PCR: POSITIVE — AB

## 2019-06-29 LAB — C-REACTIVE PROTEIN: CRP: 20.7 mg/dL — ABNORMAL HIGH (ref <1.0)

## 2019-06-29 LAB — D-DIMER, QUANTITATIVE: D-Dimer, Quant: 3.12 ug/mL-FEU — ABNORMAL HIGH (ref 0.00–0.50)

## 2019-06-29 LAB — COMPREHENSIVE METABOLIC PANEL
ALT: 19 U/L (ref 0–44)
AST: 43 U/L — ABNORMAL HIGH (ref 15–41)
Albumin: 2.8 g/dL — ABNORMAL LOW (ref 3.5–5.0)
Alkaline Phosphatase: 46 U/L (ref 38–126)
Anion gap: 19 — ABNORMAL HIGH (ref 5–15)
BUN: 27 mg/dL — ABNORMAL HIGH (ref 8–23)
CO2: 16 mmol/L — ABNORMAL LOW (ref 22–32)
Calcium: 8.5 mg/dL — ABNORMAL LOW (ref 8.9–10.3)
Chloride: 99 mmol/L (ref 98–111)
Creatinine, Ser: 1.15 mg/dL — ABNORMAL HIGH (ref 0.44–1.00)
GFR calc Af Amer: 53 mL/min — ABNORMAL LOW (ref >60)
GFR calc non Af Amer: 46 mL/min — ABNORMAL LOW (ref >60)
Glucose, Bld: 161 mg/dL — ABNORMAL HIGH (ref 70–99)
Potassium: 3.7 mmol/L (ref 3.5–5.1)
Sodium: 134 mmol/L — ABNORMAL LOW (ref 135–145)
Total Bilirubin: 0.7 mg/dL (ref 0.3–1.2)
Total Protein: 6.9 g/dL (ref 6.5–8.1)

## 2019-06-29 LAB — I-STAT CHEM 8, ED
BUN: 42 mg/dL — ABNORMAL HIGH (ref 8–23)
Calcium, Ion: 0.98 mmol/L — ABNORMAL LOW (ref 1.15–1.40)
Chloride: 105 mmol/L (ref 98–111)
Creatinine, Ser: 0.9 mg/dL (ref 0.44–1.00)
Glucose, Bld: 158 mg/dL — ABNORMAL HIGH (ref 70–99)
HCT: 40 % (ref 36.0–46.0)
Hemoglobin: 13.6 g/dL (ref 12.0–15.0)
Potassium: 5.6 mmol/L — ABNORMAL HIGH (ref 3.5–5.1)
Sodium: 136 mmol/L (ref 135–145)
TCO2: 23 mmol/L (ref 22–32)

## 2019-06-29 LAB — LACTIC ACID, PLASMA: Lactic Acid, Venous: 2.1 mmol/L (ref 0.5–1.9)

## 2019-06-29 LAB — PROTIME-INR
INR: 1.1 (ref 0.8–1.2)
Prothrombin Time: 14.2 seconds (ref 11.4–15.2)

## 2019-06-29 LAB — TRIGLYCERIDES: Triglycerides: 135 mg/dL (ref <150)

## 2019-06-29 LAB — CBG MONITORING, ED: Glucose-Capillary: 160 mg/dL — ABNORMAL HIGH (ref 70–99)

## 2019-06-29 LAB — APTT: aPTT: 30 seconds (ref 24–36)

## 2019-06-29 LAB — POC SARS CORONAVIRUS 2 AG -  ED: SARS Coronavirus 2 Ag: POSITIVE — AB

## 2019-06-29 LAB — LACTATE DEHYDROGENASE: LDH: 482 U/L — ABNORMAL HIGH (ref 98–192)

## 2019-06-29 LAB — FERRITIN: Ferritin: 263 ng/mL (ref 11–307)

## 2019-06-29 LAB — BRAIN NATRIURETIC PEPTIDE: B Natriuretic Peptide: 381.7 pg/mL — ABNORMAL HIGH (ref 0.0–100.0)

## 2019-06-29 LAB — FIBRINOGEN: Fibrinogen: 729 mg/dL — ABNORMAL HIGH (ref 210–475)

## 2019-06-29 MED ORDER — SODIUM CHLORIDE 0.9 % IV SOLN
500.0000 mg | INTRAVENOUS | Status: DC
  Administered 2019-06-29: 20:00:00 500 mg via INTRAVENOUS
  Filled 2019-06-29: qty 500

## 2019-06-29 MED ORDER — ENOXAPARIN SODIUM 40 MG/0.4ML ~~LOC~~ SOLN: 40.0000 mg | Freq: Every day | SUBCUTANEOUS | Status: DC

## 2019-06-29 MED ORDER — LACTATED RINGERS IV BOLUS (SEPSIS)
1000.0000 mL | Freq: Once | INTRAVENOUS | Status: AC
  Administered 2019-06-29: 1000 mL via INTRAVENOUS

## 2019-06-29 MED ORDER — DEXAMETHASONE SODIUM PHOSPHATE 10 MG/ML IJ SOLN: 6.0000 mg | Freq: Every day | INTRAMUSCULAR | Status: DC

## 2019-06-29 MED ORDER — LACTATED RINGERS IV BOLUS (SEPSIS)
500.0000 mL | Freq: Once | INTRAVENOUS | Status: DC
  Administered 2019-06-30: 500 mL via INTRAVENOUS

## 2019-06-29 MED ORDER — SODIUM CHLORIDE 0.9 % IV SOLN
2.0000 g | INTRAVENOUS | Status: DC
  Administered 2019-06-29: 2 g via INTRAVENOUS
  Filled 2019-06-29: qty 20

## 2019-06-29 NOTE — ED Triage Notes (Signed)
Pt to ED via [facility] from home.  EMS reports they were called due to hypoglycemia, pt's CBG was 60.  Pt was given a coke and CBG increased to 110.  Pt was found to be tachycardic and short of breath.  Family reports they have been Covid +  Past week but pt has not been tested.

## 2019-06-29 NOTE — ED Provider Notes (Signed)
This patient is an ill-appearing 79 year old female who presents by ambulance transport for shortness of breath and altered mental status.  The patient is not able to answer any questions, she is able to tell me her name and follows some simple commands but beyond that the history is unobtainable. She is tachypneic, tachycardic to 120 and reportedly was tachycardic into the 200 range prehospital. She was given some AV nodal blocking agents and is now in the 120s and what appears to be sinus tachycardia. She has rales at the bases, she is tachypneic, using some accessory muscles and is in respiratory distress. She has no significant edema of the legs, she does have some dry mucous membranes.  Reportedly this patient has multiple Covid contacts, it is unclear whether she has been exposed or not.  She is critically ill, see critical care documentation below and in the resident's note.  Anticipate x-ray, antibiotics, admit, may be Covid pneumonia  SUSIE EHRESMAN was evaluated in Emergency Department on Jul 14, 2019 for the symptoms described in the history of present illness. She was evaluated in the context of the global COVID-19 pandemic, which necessitated consideration that the patient might be at risk for infection with the SARS-CoV-2 virus that causes COVID-19. Institutional protocols and algorithms that pertain to the evaluation of patients at risk for COVID-19 are in a state of rapid change based on information released by regulatory bodies including the CDC and federal and state organizations. These policies and algorithms were followed during the patient's care in the ED.  .Critical Care Performed by: Noemi Chapel, MD Authorized by: Noemi Chapel, MD   Critical care provider statement:    Critical care time (minutes):  35   Critical care time was exclusive of:  Separately billable procedures and treating other patients and teaching time   Critical care was necessary to treat or prevent imminent  or life-threatening deterioration of the following conditions:  Respiratory failure   Critical care was time spent personally by me on the following activities:  Blood draw for specimens, development of treatment plan with patient or surrogate, discussions with consultants, evaluation of patient's response to treatment, examination of patient, obtaining history from patient or surrogate, ordering and performing treatments and interventions, ordering and review of laboratory studies, ordering and review of radiographic studies, pulse oximetry, re-evaluation of patient's condition and review of old charts   Medical screening examination/treatment/procedure(s) were conducted as a shared visit with non-physician practitioner(s) and myself.  I personally evaluated the patient during the encounter.  Clinical Impression:   Final diagnoses:  [id number]  Atrial fibrillation, unspecified type ([place])  Hypoalbuminemia  acute respiratory failure with hypoxia       Noemi Chapel, MD Jul 14, 2019 [id number]

## 2019-06-29 NOTE — ED Notes (Signed)
Purewick placed on pt. 

## 2019-06-29 NOTE — ED Notes (Signed)
[place] granddaughter [phone] looking for an update on pt
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
## 2019-06-29 NOTE — ED Notes (Signed)
Raynette daughter [phone] looking for an update on pt

## 2019-06-30 ENCOUNTER — Inpatient Hospital Stay (HOSPITAL_COMMUNITY): Payer: Medicare HMO

## 2019-06-30 ENCOUNTER — Telehealth: Payer: Self-pay | Admitting: Internal Medicine

## 2019-06-30 ENCOUNTER — Telehealth: Payer: Self-pay | Admitting: *Deleted

## 2019-06-30 DIAGNOSIS — I4891 Unspecified atrial fibrillation: Secondary | ICD-10-CM | POA: Diagnosis not present

## 2019-06-30 DIAGNOSIS — N179 Acute kidney failure, unspecified: Secondary | ICD-10-CM | POA: Diagnosis not present

## 2019-06-30 DIAGNOSIS — I5022 Chronic systolic (congestive) heart failure: Secondary | ICD-10-CM | POA: Diagnosis not present

## 2019-06-30 DIAGNOSIS — Z87898 Personal history of other specified conditions: Secondary | ICD-10-CM

## 2019-06-30 DIAGNOSIS — J9601 Acute respiratory failure with hypoxia: Secondary | ICD-10-CM | POA: Diagnosis not present

## 2019-06-30 DIAGNOSIS — E162 Hypoglycemia, unspecified: Secondary | ICD-10-CM

## 2019-06-30 LAB — URINALYSIS, ROUTINE W REFLEX MICROSCOPIC
Bilirubin Urine: NEGATIVE
Glucose, UA: NEGATIVE mg/dL
Hgb urine dipstick: NEGATIVE
Ketones, ur: 5 mg/dL — AB
Leukocytes,Ua: NEGATIVE
Nitrite: NEGATIVE
Protein, ur: >=300 mg/dL — AB
Specific Gravity, Urine: 1.027 (ref 1.005–1.030)
pH: 5 (ref 5.0–8.0)

## 2019-06-30 LAB — PROCALCITONIN: Procalcitonin: 0.1 ng/mL

## 2019-06-30 LAB — CBC WITH DIFFERENTIAL/PLATELET
Abs Immature Granulocytes: 0.16 10*3/uL — ABNORMAL HIGH (ref 0.00–0.07)
Basophils Absolute: 0 10*3/uL (ref 0.0–0.1)
Basophils Relative: 0 %
Eosinophils Absolute: 0 10*3/uL (ref 0.0–0.5)
Eosinophils Relative: 0 %
HCT: 42.2 % (ref 36.0–46.0)
Hemoglobin: 13.2 g/dL (ref 12.0–15.0)
Immature Granulocytes: 1 %
Lymphocytes Relative: 14 %
Lymphs Abs: 2.5 10*3/uL (ref 0.7–4.0)
MCH: 28.6 pg (ref 26.0–34.0)
MCHC: 31.3 g/dL (ref 30.0–36.0)
MCV: 91.5 fL (ref 80.0–100.0)
Monocytes Absolute: 0.8 10*3/uL (ref 0.1–1.0)
Monocytes Relative: 4 %
Neutro Abs: 14.8 10*3/uL — ABNORMAL HIGH (ref 1.7–7.7)
Neutrophils Relative %: 81 %
Platelets: 344 10*3/uL (ref 150–400)
RBC: 4.61 MIL/uL (ref 3.87–5.11)
RDW: 16.2 % — ABNORMAL HIGH (ref 11.5–15.5)
WBC: 18.3 10*3/uL — ABNORMAL HIGH (ref 4.0–10.5)
nRBC: 0 % (ref 0.0–0.2)

## 2019-06-30 LAB — COMPREHENSIVE METABOLIC PANEL
ALT: 19 U/L (ref 0–44)
AST: 39 U/L (ref 15–41)
Albumin: 2.6 g/dL — ABNORMAL LOW (ref 3.5–5.0)
Alkaline Phosphatase: 48 U/L (ref 38–126)
Anion gap: 14 (ref 5–15)
BUN: 21 mg/dL (ref 8–23)
CO2: 21 mmol/L — ABNORMAL LOW (ref 22–32)
Calcium: 8.4 mg/dL — ABNORMAL LOW (ref 8.9–10.3)
Chloride: 105 mmol/L (ref 98–111)
Creatinine, Ser: 1 mg/dL (ref 0.44–1.00)
GFR calc Af Amer: >60 mL/min (ref >60)
GFR calc non Af Amer: 54 mL/min — ABNORMAL LOW (ref >60)
Glucose, Bld: 166 mg/dL — ABNORMAL HIGH (ref 70–99)
Potassium: 3.9 mmol/L (ref 3.5–5.1)
Sodium: 140 mmol/L (ref 135–145)
Total Bilirubin: 0.8 mg/dL (ref 0.3–1.2)
Total Protein: 6.4 g/dL — ABNORMAL LOW (ref 6.5–8.1)

## 2019-06-30 LAB — MAGNESIUM: Magnesium: 1.3 mg/dL — ABNORMAL LOW (ref 1.7–2.4)

## 2019-06-30 LAB — C-REACTIVE PROTEIN: CRP: 19.2 mg/dL — ABNORMAL HIGH (ref <1.0)

## 2019-06-30 LAB — CBG MONITORING, ED: Glucose-Capillary: 144 mg/dL — ABNORMAL HIGH (ref 70–99)

## 2019-06-30 LAB — LACTIC ACID, PLASMA: Lactic Acid, Venous: 2.3 mmol/L (ref 0.5–1.9)

## 2019-06-30 LAB — ABO/RH: ABO/RH(D): B POS

## 2019-06-30 MED ORDER — SODIUM CHLORIDE 0.9% FLUSH
3.0000 mL | Freq: Two times a day (BID) | INTRAVENOUS | Status: DC
  Administered 2019-06-30 (×2): 3 mL via INTRAVENOUS

## 2019-06-30 MED ORDER — METOPROLOL TARTRATE 50 MG PO TABS: 50.0000 mg | ORAL_TABLET | Freq: Two times a day (BID) | ORAL | Status: DC

## 2019-06-30 MED ORDER — METOPROLOL TARTRATE 5 MG/5ML IV SOLN: 5.0000 mg | INTRAVENOUS | Status: DC | PRN

## 2019-06-30 MED ORDER — SODIUM CHLORIDE 0.9 % IV BOLUS
250.0000 mL | Freq: Once | INTRAVENOUS | Status: AC
  Administered 2019-06-30: 01:00:00 250 mL via INTRAVENOUS

## 2019-06-30 MED ORDER — IOHEXOL 350 MG/ML SOLN
100.0000 mL | Freq: Once | INTRAVENOUS | Status: AC | PRN
  Administered 2019-06-30: 01:00:00 100 mL via INTRAVENOUS

## 2019-06-30 MED ORDER — GLYCOPYRROLATE 0.2 MG/ML IJ SOLN: 0.2000 mg | INTRAMUSCULAR | Status: DC | PRN

## 2019-06-30 MED ORDER — SODIUM CHLORIDE 0.9 % IV SOLN: 100.0000 mg | Freq: Every day | INTRAVENOUS | Status: DC

## 2019-06-30 MED ORDER — LORAZEPAM 2 MG/ML IJ SOLN
0.5000 mg | Freq: Once | INTRAMUSCULAR | Status: AC
  Administered 2019-06-30: 05:00:00 0.5 mg via INTRAVENOUS
  Filled 2019-06-30: qty 1

## 2019-06-30 MED ORDER — SODIUM CHLORIDE 0.9% FLUSH: 3.0000 mL | INTRAVENOUS | Status: DC | PRN

## 2019-06-30 MED ORDER — BUMETANIDE 0.25 MG/ML IJ SOLN
0.5000 mg | Freq: Once | INTRAMUSCULAR | Status: AC
  Administered 2019-06-30: 0.5 mg via INTRAVENOUS
  Filled 2019-06-30 (×2): qty 2

## 2019-06-30 MED ORDER — LORAZEPAM 2 MG/ML IJ SOLN: 1.0000 mg | INTRAMUSCULAR | Status: DC | PRN

## 2019-06-30 MED ORDER — SODIUM CHLORIDE 0.9 % IV SOLN
100.0000 mg | INTRAVENOUS | Status: AC
  Administered 2019-06-30 (×2): 100 mg via INTRAVENOUS
  Filled 2019-06-30 (×3): qty 20

## 2019-06-30 MED ORDER — METOPROLOL TARTRATE 5 MG/5ML IV SOLN
5.0000 mg | INTRAVENOUS | Status: AC | PRN
  Administered 2019-06-30: 5 mg via INTRAVENOUS
  Filled 2019-06-30: qty 5

## 2019-06-30 MED ORDER — LORAZEPAM 2 MG/ML IJ SOLN
0.5000 mg | Freq: Once | INTRAMUSCULAR | Status: AC
  Administered 2019-06-30: 04:00:00 0.5 mg via INTRAVENOUS

## 2019-06-30 MED ORDER — LORAZEPAM 2 MG/ML PO CONC: 1.0000 mg | ORAL | Status: DC | PRN

## 2019-06-30 MED ORDER — LORAZEPAM 2 MG/ML IJ SOLN
INTRAMUSCULAR | Status: AC
  Filled 2019-06-30: qty 1

## 2019-06-30 MED ORDER — GLYCOPYRROLATE 1 MG PO TABS: 1.0000 mg | ORAL_TABLET | ORAL | Status: DC | PRN

## 2019-06-30 MED ORDER — LORAZEPAM 1 MG PO TABS: 1.0000 mg | ORAL_TABLET | ORAL | Status: DC | PRN

## 2019-06-30 MED ORDER — LEVETIRACETAM IN NACL 1000 MG/100ML IV SOLN
1000.0000 mg | Freq: Two times a day (BID) | INTRAVENOUS | Status: DC
  Administered 2019-06-30: 1000 mg via INTRAVENOUS
  Filled 2019-06-30 (×2): qty 100

## 2019-06-30 MED ORDER — MORPHINE 100MG IN NS 100ML (1MG/ML) PREMIX INFUSION
2.0000 mg/h | INTRAVENOUS | Status: DC
  Administered 2019-06-30: 2 mg/h via INTRAVENOUS
  Filled 2019-06-30: qty 100

## 2019-06-30 MED ORDER — MORPHINE SULFATE (PF) 2 MG/ML IV SOLN
2.0000 mg | INTRAVENOUS | Status: DC | PRN
  Administered 2019-06-30: 2 mg via INTRAVENOUS
  Filled 2019-06-30: qty 1

## 2019-06-30 MED ORDER — LORAZEPAM 2 MG/ML IJ SOLN
1.0000 mg | INTRAMUSCULAR | Status: DC | PRN
  Administered 2019-06-30 (×2): 1 mg via INTRAVENOUS
  Filled 2019-06-30 (×2): qty 1

## 2019-06-30 MED ORDER — LEVETIRACETAM 500 MG PO TABS: 1000.0000 mg | ORAL_TABLET | Freq: Two times a day (BID) | ORAL | Status: DC

## 2019-06-30 MED ORDER — LORAZEPAM 2 MG/ML IJ SOLN: 0.5000 mg | INTRAMUSCULAR | Status: DC | PRN

## 2019-06-30 MED ORDER — SODIUM CHLORIDE 0.9 % IV SOLN: 250.0000 mL | INTRAVENOUS | Status: DC | PRN

## 2019-06-30 MED ORDER — MORPHINE BOLUS VIA INFUSION
2.0000 mg | INTRAVENOUS | Status: DC | PRN
  Administered 2019-06-30 (×2): 2 mg via INTRAVENOUS
  Filled 2019-06-30: qty 2

## 2019-06-30 MED ORDER — GLYCOPYRROLATE 0.2 MG/ML IJ SOLN
0.2000 mg | INTRAMUSCULAR | Status: DC | PRN
  Administered 2019-06-30 (×3): 0.2 mg via INTRAVENOUS
  Filled 2019-06-30 (×3): qty 1

## 2019-06-30 MED ORDER — INSULIN ASPART 100 UNIT/ML ~~LOC~~ SOLN: 0.0000 [IU] | Freq: Three times a day (TID) | SUBCUTANEOUS | Status: DC

## 2019-07-01 LAB — URINE CULTURE

## 2019-07-01 NOTE — Progress Notes (Signed)
Contacted family members about further steps with patient placement.

## 2019-07-01 NOTE — Progress Notes (Signed)
Patient has been transported to [place] along with death certificate.

## 2019-07-03 NOTE — Telephone Encounter (Signed)
    Daughter and son calling to report patient death over the weekend. Requesting a call back for direction regarding autopsy. The family wants to know the exact cause of death. They want to know if she had covid or other underlying health conditions were the cause.  Requesting call back

## 2019-07-03 NOTE — Telephone Encounter (Signed)
I am not sure what to say as I was not the hospital attending; they can also request an autopsy, but if the death is not deemed suspicious or unexpected, they will likely be informed that they would need to consider paying for a private autopsy, which can be quite expensive.

## 2019-07-04 LAB — CULTURE, BLOOD (ROUTINE X 2)
Culture: NO GROWTH
Special Requests: ADEQUATE

## 2019-07-04 NOTE — Telephone Encounter (Signed)
Left voicemail on Pt daughters phone to call clinic back for information of autopsy.

## 2019-07-05 LAB — CULTURE, BLOOD (ROUTINE X 2): Culture: NO GROWTH

## 2019-07-06 ENCOUNTER — Ambulatory Visit (HOSPITAL_COMMUNITY): Payer: Medicaid Other

## 2019-07-06 ENCOUNTER — Ambulatory Visit: Payer: Medicaid Other

## 2019-07-07 ENCOUNTER — Ambulatory Visit: Payer: Medicare Other | Admitting: Internal Medicine

## 2019-07-08 NOTE — Progress Notes (Signed)
Chaplain responded to a request from ED to come support the family of this patient Chaplain met DTR Raynell and offered support.  Raynell wore PPE to go see her mother and chaplain went to be with the rest of the family, son in law, granddaugther-Victoria and grandson Dalton.  Family was open about the patient and events that led up to her coming to the hospital.  Patient was living alone next door to family and [place] has been a primary caregiver.  Family shared patient has been open about EOL and that she does not want to prolong life if things get bad.  Chaplain provided empathetic/reflective listening and words of comfort and support as well as hospitality.  Family left to go home as patient will be transferred to a room.  Chaplain available as needed. [place], MDiv.     July 02, 2019 0600  Clinical Encounter Type  Visited With Family;Health care provider  Visit Type Critical Care  Referral From Nurse  Consult/Referral To Chaplain  Stress Factors  Patient Stress Factors Health changes

## 2019-07-08 NOTE — ED Notes (Signed)
Admitting MD notified on patient's increasing SOB/low O2 sat = 80's% while on 4 lpm/[place] , NRB mask applied .

## 2019-07-08 NOTE — Sepsis Progress Note (Signed)
Notified bedside nurse of need to order antibioticsRN at bedside will notify MD for antibiotic order, pt had a dose already base on protocol.

## 2019-07-08 NOTE — Progress Notes (Signed)
Patient found deceased at bedside on 01-Jul-2019 @ 2302. Verified TOD with Dorthula Matas, RN. MD on call notified & death certificate received. Family notified about patient's death & declined request to visit.   Family is still unsure about funeral home for patient. The will be in contact with [facility] on [employer].    Patient's belongings are with the family. All 3 IVs have been removed. Wasted 70 mL of morphine sulfate, witnessed by Manuela Schwartz, RN.

## 2019-07-08 NOTE — Progress Notes (Signed)
Patient now on room air for full comfort care.

## 2019-07-08 NOTE — ED Notes (Signed)
Attempted to give Report

## 2019-07-08 NOTE — Progress Notes (Signed)
Pt noted by RN and RT to have increased WOB, O2 needs increased, pt placed on non rebreather, using accessory muscles.  Light pink frothy discharge at mouth and coarse wet lung sounds, RT with frequent suction.  Bumex ordered.  HR and BP elevated, Ativan and metoprolol ordered with mild improvement.  Family called by ED secretary.  O2 sats stable about 88-90%.

## 2019-07-08 NOTE — ED Notes (Signed)
Patient is lethargic , NRB mask 15 lpm continues , audible chest crackles/rales on both lung fields , IV sites intact , repositioned for comfort , complete bed linen change /stool and urinary incontinence care provided by RN and EMT . No family present at this time . Patient seen and evaluated by admitting MD .

## 2019-07-08 NOTE — Progress Notes (Signed)
PROGRESS NOTE                                                                                                                                                                                                             Patient Demographics:    Carolyn [name], is a 79 y.o. female, DOB - May 04, 1940, BHA:[id number]  Outpatient Primary MD for the patient is Biagio Borg, MD   Admit date - 06/14/2019   LOS - 1  Chief Complaint  Patient presents with  . Shortness of Breath       Brief Narrative: Patient is a 79 y.o. female with PMHx of lung CA with brain mets-presented with severe hypoxemic respiratory failure requiring heated high flow+ NRB secondary to COVID-19 pneumonia.  After discussion with family-transition to comfort measures on 4/23.  Significant Events: 4/22>> admit with severe hypoxemia secondary to COVID-19.  DNR in place. 4/23>> deteriorating-very tachypneic/tachycardic-uncomfortable-after d/w family-transition to comfort measures  COVID-19 medications: Steroids: 4/22 x 1 Remdesivir: 4/22 x 1  Antibiotics: Zithromax: 4/22 x 1  Microbiology data: 4/22: Blood culture>> no growth 4/23: Blood culture>> no growth  DVT prophylaxis: None  Procedures: None  Consults: None    Subjective:    Reilynn Lauro today was seen earlier in the emergency room-she was very tachypneic-tachycardic to the 140s-very uncomfortable-and struggling to breathe.  She remained very confused and hardly responsive.   Assessment  & Plan :   Acute Hypoxic Resp Failure due to Covid 19 Viral pneumonia: Deteriorating overnight-severely hypoxemic-O2 saturation in the mid 80s with heated high flow+ NRB.  Discussed with daughter and son-in-law over the phone x2 this morning-given poor prognosis-unlikely to survive such as severe illness-DNR in place-family agreeable to transition to full comfort measures.    Fever: afebrile  O2  requirements:  SpO2: (!) 89 % O2 Flow Rate (L/min): 40 L/min(40L HFNC/15L NRB) FiO2 (%): 100 %   COVID-19 Labs: Recent Labs    06/10/2019 1935 Jul 24, 2019 0410  DDIMER 3.12*  --   FERRITIN 263  --   LDH 482*  --   CRP 20.7* 19.2*       Component Value Date/Time   BNP 381.7 (H) 06/09/2019 1945    Recent Labs  Lab 06/13/2019 2200  PROCALCITON 0.10    Lab Results  Component Value Date   SARSCOV2NAA POSITIVE (A) 06/12/2019  [place] NEGATIVE 05/22/2019   [place] NEGATIVE 05/01/2019   [place] NEGATIVE 14/78/2956    Acute metabolic encephalopathy: Secondary to COVID-19 related hypoxemia-remains obtunded  A. fib with RVR: Transitioned to comfort measures-all rate control medications discontinued.  Per H&P-not a candidate for anticoagulation given brain metastases.  AKI: Likely hemodynamically mediated-resolved with supportive care.  Seizure disorder: We will continue Keppra for comfort.  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 Jamal Collin is aware of patient's critical illness with plans to transition to comfort care  Palliative care: DNR already in place-severely hypoxic with severe COVID-19 pneumonia-given ongoing deterioration since admission-unlikely to survive this hospitalization.  Very uncomfortable-struggling to breathe this morning-remains obtunded-after discussion with family-have transitioned to full comfort measures.  Starting morphine infusion-likely will expire while inpatient-not stable for transfer to residential hospice.  Obesity: Estimated body mass index is 33.07 kg/m as calculated from the following:   Height as of this encounter: 5\' 1"  (1.549 m).   Weight as of this encounter: 79.4 kg.    ABG:    Component Value Date/Time   PHART 7.443 06/27/2019 2011   PCO2ART 27.5 (L) 06/14/2019 2011   PO2ART 92 06/28/2019 2011    HCO3 18.8 (L) 06/09/2019 2011   TCO2 20 (L) 07/02/2019 2011   ACIDBASEDEF 4.0 (H) 06/24/2019 2011   O2SAT 98.0 06/28/2019 2011    Vent Settings: N/A FiO2 (%):  [100 %] 100 %  Condition - Extremely Guarded  Family Communication  :  Daughter updated over the phone x2 on 4/23  Code Status :  DNR  Diet :  Diet Order            Diet Heart Room service appropriate? Yes; Fluid consistency: Thin  Diet effective now               Disposition Plan  :  Status is: Inpatient  Remains inpatient appropriate because:Inpatient level of care appropriate due to severity of illness   Dispo: The patient is from: Home              Anticipated d/c is to: inpatient death              Anticipated d/c date is: 2 days              Patient currently is not medically stable to d/c.  Barriers to discharge: Hypoxia requiring O2 supplementation/complete 5 days of IV Remdesivir  Antimicorbials  :    Anti-infectives (From admission, onward)   Start     Dose/Rate Route Frequency Ordered Stop   06/13/2019 1000  remdesivir 100 mg in sodium chloride 0.9 % 100 mL IVPB  Status:  Discontinued     100 mg 200 mL/hr over 30 Minutes Intravenous Daily Jul 12, 2019 0001 07/12/2019 1100   07/12/2019 0030  remdesivir 100 mg in sodium chloride 0.9 % 100 mL IVPB     100 mg 200 mL/hr over 30 Minutes Intravenous Every 30 min 07/12/2019 0001 07/12/19 0225   07/03/2019 2000  cefTRIAXone (ROCEPHIN) 2 g in sodium chloride 0.9 % 100 mL IVPB  Status:  Discontinued     2 g 200 mL/hr over 30 Minutes Intravenous Every 24 hours 06/20/2019 1946 06/16/2019 2331   06/10/2019 2000  azithromycin (ZITHROMAX) 500 mg in sodium chloride 0.9 % 250 mL IVPB  Status:  Discontinued     500 mg 250 mL/hr over 60 Minutes Intravenous Every 24  hours 06/28/2019 1946 07/06/2019 2331      Inpatient Medications  Scheduled Meds: . sodium chloride flush  3 mL Intravenous Q12H   Continuous Infusions: . sodium chloride    . morphine 2 mg/hr (2019/07/03 1149)   PRN  Meds:.sodium chloride, glycopyrrolate **OR** glycopyrrolate **OR** glycopyrrolate, LORazepam **OR** LORazepam **OR** LORazepam, LORazepam, morphine, sodium chloride flush   Time Spent in minutes  35   See all Orders from today for further details   Oren Binet M.D on 07-03-2019 at 3:37 PM  To page go to [email or web address] - use universal password  [facility] -  Office  [phone]    Objective:   Vitals:   07/03/2019 0945 Jul 03, 2019 0959 Jul 03, 2019 1038 07-03-2019 1506  BP: (!) 151/95 (!) 151/95 (!) 148/59   Pulse: (!) 123 (!) 119 (!) 116 (!) 152  Resp: (!) 32 (!) 29 (!) 30 (!) 22  Temp: 98 F (36.7 C)  98 F (36.7 C)   TempSrc: Axillary  Axillary   SpO2: (!) 87% (!) 88% (!) 89%   Weight:      Height:        Wt Readings from Last 3 Encounters:  07/07/2019 79.4 kg  06/02/19 79.4 kg  06/01/19 78.5 kg     Intake/Output Summary (Last 24 hours) at July 03, 2019 1537 Last data filed at 07/03/19 1150 Gross per 24 hour  Intake 3 ml  Output --  Net 3 ml     Physical Exam Gen Exam: Tachypneic-grunting at times-using accessory muscles.  Barely responsive HEENT:atraumatic, normocephalic Chest: Transmitted upper airway sounds-moving air bilaterally CVS:S1S2 is regular-tachycardic Abdomen:soft non tender, non distended Extremities:no edema Neurology: Seems to withdraw all 4 extremities to pain-difficult exam Skin: no rash   Data Review:    CBC Recent Labs  Lab 06/24/2019 1935 07/04/2019 2006 06/22/2019 2011 07-03-19 0410  WBC 10.2  --   --  18.3*  HGB 12.9 13.6 12.2 13.2  HCT 40.5 40.0 36.0 42.2  PLT 328  --   --  344  MCV 89.6  --   --  91.5  MCH 28.5  --   --  28.6  MCHC 31.9  --   --  31.3  RDW 15.9*  --   --  16.2*  LYMPHSABS 1.2  --   --  2.5  MONOABS 0.7  --   --  0.8  EOSABS 0.0  --   --  0.0  BASOSABS 0.0  --   --  0.0    Chemistries  Recent Labs  Lab 07/02/2019 1935 06/24/2019 2006 06/11/2019 2011 03-Jul-2019 0410  NA 134* 136 138 140  K 3.7 5.6* 3.4* 3.9   CL 99 105  --  105  CO2 16*  --   --  21*  GLUCOSE 161* 158*  --  166*  BUN 27* 42*  --  21  CREATININE 1.15* 0.90  --  1.00  CALCIUM 8.5*  --   --  8.4*  MG  --   --   --  1.3*  AST 43*  --   --  39  ALT 19  --   --  19  ALKPHOS 46  --   --  48  BILITOT 0.7  --   --  0.8   ------------------------------------------------------------------------------------------------------------------ Recent Labs    06/24/2019 1947  TRIG 135    Lab Results  Component Value Date   HGBA1C 9.6 (H) 05/22/2019   ------------------------------------------------------------------------------------------------------------------ No results for input(s): TSH, T4TOTAL, T3FREE, THYROIDAB in  the last 72 hours.  Invalid input(s): FREET3 ------------------------------------------------------------------------------------------------------------------ Recent Labs    06/26/2019 1935  FERRITIN 263    Coagulation profile Recent Labs  Lab 06/23/2019 1935  INR 1.1    Recent Labs    07/02/2019 1935  DDIMER 3.12*    Cardiac Enzymes No results for input(s): CKMB, TROPONINI, MYOGLOBIN in the last 168 hours.  Invalid input(s): CK ------------------------------------------------------------------------------------------------------------------    Component Value Date/Time   BNP 381.7 (H) 06/20/2019 1945    Micro Results Recent Results (from the past 240 hour(s))  Blood Culture (routine x 2)     Status: None (Preliminary result)   Collection Time: 06/22/2019  7:50 PM   Specimen: BLOOD  Result Value Ref Range Status   Specimen Description BLOOD RIGHT ARM  Final   Special Requests   Final    BOTTLES DRAWN AEROBIC AND ANAEROBIC Blood Culture adequate volume   Culture   Final    NO GROWTH < 24 HOURS Performed at [facility], [place] [address] [place], [place] [address]    Report Status PENDING  Incomplete  Respiratory Panel by RT PCR (Flu A&B, Covid) - Nasopharyngeal Swab     Status: Abnormal    Collection Time: 06/15/2019  8:23 PM   Specimen: Nasopharyngeal Swab  Result Value Ref Range Status   SARS Coronavirus 2 by RT PCR POSITIVE (A) NEGATIVE Final    Comment: RESULT CALLED TO, READ BACK BY AND VERIFIED WITH: F PEIDERT RN 06/12/2019 2358 JDW (NOTE) SARS-CoV-2 target nucleic acids are DETECTED. SARS-CoV-2 RNA is generally detectable in upper respiratory specimens  during the acute phase of infection. Positive results are indicative of the presence of the identified virus, but do not rule out bacterial infection or co-infection with other pathogens not detected by the test. Clinical correlation with patient history and other diagnostic information is necessary to determine patient infection status. The expected result is Negative. Fact Sheet for Patients:  PinkCheek.be Fact Sheet for Healthcare Providers: GravelBags.it This test is not yet approved or cleared by the [place] FDA and  has been authorized for detection and/or diagnosis of SARS-CoV-2 by FDA under an Emergency Use Authorization (EUA).  This EUA will remain in effect (meaning this test can be used) for the  duration of  the COVID-19 declaration under Section 564(b)(1) of the Act, 21 U.S.C. section 360bbb-3(b)(1), unless the authorization is terminated or revoked sooner.    Influenza A by PCR NEGATIVE NEGATIVE Final   Influenza B by PCR NEGATIVE NEGATIVE Final    Comment: (NOTE) The Xpert Xpress SARS-CoV-2/FLU/RSV assay is intended as an aid in  the diagnosis of influenza from Nasopharyngeal swab specimens and  should not be used as a sole basis for treatment. Nasal washings and  aspirates are unacceptable for Xpert Xpress SARS-CoV-2/FLU/RSV  testing. Fact Sheet for Patients: PinkCheek.be Fact Sheet for Healthcare Providers: GravelBags.it This test is not yet approved or cleared by the [place] FDA and  has been authorized for detection and/or diagnosis of SARS-CoV-2 by  FDA under an Emergency Use Authorization (EUA). This EUA will remain  in effect (meaning this test can be used) for the duration of the  Covid-19 declaration under Section 564(b)(1) of the Act, 21  U.S.C. section 360bbb-3(b)(1), unless the authorization is  terminated or revoked. Performed at [facility], [place] [address] [place], [place] [address]   Blood Culture (routine x 2)     Status: None (Preliminary result)   Collection Time: Jul 03, 2019  3:12 AM   Specimen: BLOOD LEFT FOREARM  Result Value Ref Range Status   Specimen Description BLOOD LEFT FOREARM  Final   Special Requests   Final    BOTTLES DRAWN AEROBIC ONLY Blood Culture results may not be optimal due to an inadequate volume of blood received in culture bottles   Culture   Final    NO GROWTH < 12 HOURS Performed at [facility], [address] [place], [place] [address]    Report Status PENDING  Incomplete    Radiology Reports CT ANGIO CHEST PE W OR WO CONTRAST  Result Date: 07-18-19 CLINICAL DATA:  Respiratory failure EXAM: CT ANGIOGRAPHY CHEST WITH CONTRAST TECHNIQUE: Multidetector CT imaging of the chest was performed using the standard protocol during bolus administration of intravenous contrast. Multiplanar CT image reconstructions and MIPs were obtained to evaluate the vascular anatomy. CONTRAST:  179mL OMNIPAQUE IOHEXOL 350 MG/ML SOLN COMPARISON:  02/22/2019.  Earlier chest x-ray. FINDINGS: Cardiovascular: No filling defects in the pulmonary arteries to suggest pulmonary emboli. Heart is mildly enlarged. Aortic and coronary artery calcifications. No evidence of aortic aneurysm. Pacer in place with leads in the right heart. Mediastinum/Nodes: No mediastinal, hilar, or axillary adenopathy. Trachea and esophagus are unremarkable. Thyroid unremarkable. Lungs/Pleura: Extensive ground-glass airspace disease in the mid and lower  lungs. Some peripheral predilection. Mild centrilobular emphysema. No effusions. Upper Abdomen: Imaging into the upper abdomen shows no acute findings. Musculoskeletal: Chest wall soft tissues are unremarkable. No acute bony abnormality. Review of the MIP images confirms the above findings. IMPRESSION: New extensive ground-glass airspace opacities within the mid and lower lungs, predominantly peripherally. Appearance is concerning for multifocal pneumonia. Cardiomegaly. No evidence of pulmonary embolus. Aortic Atherosclerosis (ICD10-I70.0) and Emphysema (ICD10-J43.9). Electronically Signed   By: Rolm Baptise M.D.   On: 07-18-2019 01:19   DG Chest Port 1 View  Result Date: 06/22/2019 CLINICAL DATA:  Short of breath. EXAM: PORTABLE CHEST 1 VIEW COMPARISON:  05/22/2019 and earlier exams. FINDINGS: Cardiac silhouette is mildly enlarged. Stable left anterior chest wall biventricular cardioverter-defibrillator. No mediastinal or hilar masses. Mild hazy opacity noted in the lower lungs. There are also prominent bronchovascular markings in the lower lungs. Remainder of the lungs is clear. No convincing pleural effusion and no pneumothorax. Skeletal structures are demineralized but grossly intact. Findings are stable compared to the prior study. IMPRESSION: 1. No acute cardiopulmonary disease. Chronic findings stable from the prior exam. Electronically Signed   By: Lajean Manes M.D.   On: 07/03/2019 20:07   CUP PACEART INCLINIC DEVICE CHECK  Result Date: 06/01/2019 CRT-D device check in office. Thresholds and sensing consistent with previous device measurements. Lead impedance trends stable over time. 8.1% AT/AF burden, not a candidate for [place] due to lung CA with mets to brain. 1 AF w/RVR episode in VF zone, treated with ATP x1. Available NSVT episodes appear atrially-driven. Tachy therapy disabled per [facility], alert turned off in [employer]. Patient bi-ventricularly pacing 93% of the time. Device programmed with appropriate  safety margins. Estimated longevity 3 years. Patient enrolled in remote follow up. [place]lin on 07/17/19 and ROV EP APP in 3 months.Levander Campion BSN, RN, CCDS

## 2019-07-08 NOTE — Progress Notes (Signed)
Pt taken off NRB and HHFNC decreased to 25L as pt is transitioning to comfort care

## 2019-07-08 NOTE — ED Notes (Signed)
+  Tele   bfast

## 2019-07-08 NOTE — ED Notes (Signed)
MD noted for morphine

## 2019-07-08 NOTE — Death Summary Note (Signed)
DEATH SUMMARY   Patient Details  Name: Carolyn [name] MRN: [id number] DOB: 09-17-1940  Admission/Discharge Information   Admit Date:  06-Jul-2019  Date of Death: Date of Death: 07-Jul-2019  Time of Death: Time of Death: May 23, 2300  Length of Stay: 2  Referring Physician: Biagio Borg, MD   Reason(s) for Hospitalization   Acute hypoxic respiratory failure secondary to COVID-19 pneumonia  Diagnoses  Preliminary cause of death:  Principal Problem:   Acute respiratory failure with hypoxia ([place]) Active Problems:   AKI (acute kidney injury) ([place])   Hypoglycemia   History of seizure   Atrial fibrillation with RVR ([place])   Chronic systolic CHF (congestive heart failure) ([place])   COVID-19 pneumonia   Brief Hospital Course (including significant findings, care, treatment, and services provided and events leading to death)  Brief Narrative: Carolyn [name] is a 79 y.o. year old female with PMHx of lung CA with brain mets-presented with severe hypoxemic respiratory failure requiring heated high flow+ NRB secondary to COVID-19 pneumonia.  After discussion with family-transition to comfort measures on July 07, 2022.  Hospital course by problem list: Acute Hypoxic Resp Failure due to Covid 19 Viral pneumonia:  Appeared to have severe disease on initial presentation-rapidly deteriorated post admission-was requiring heated high flow and NRB with O2 saturation in the mid 80s.  Very uncomfortable-tachypneic-and was very lethargic.  It was felt that given patient's situation-underlying comorbidities-severity of this illness-it was felt that patient would not likely survive.  DNR was already established.  This MD spoke with family x2-explained poor prognosis-continued deterioration-family was agreeable to transition to full comfort measures.  Patient was then started on a morphine infusion and other comfort measures.  Acute metabolic encephalopathy: Secondary to COVID-19 related hypoxemia-remained obtunded.  A. fib with  RVR: Transitioned to comfort measures-all rate control medications discontinued.  Per H&P-not a candidate for anticoagulation given brain metastases.  AKI: Likely hemodynamically mediated-resolved with supportive care.  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 Jamal Collin is aware of patient's critical illness with plans to transition to comfort care  Palliative care: DNR already in place-severely hypoxic with severe COVID-19 pneumonia-given ongoing deterioration since admission-failed unlikely to survive this hospitalization.  She was  very uncomfortable-struggling to breathe that particular morning-remainder presented.  After discussion with family-she was transitioned to full comfort measures.  She was started on a morphine infusion-given her severity of illness-she was felt not stable to transfer to residential hospice.  .  Morbid Obesity: Estimated body mass index is 33.07 kg/m as calculated from the following:   Height as of this encounter: 5\' 1"  (1.549 m).   Weight as of this encounter: 79.4 kg.    Pertinent Labs and Studies  Significant Diagnostic Studies CT ANGIO CHEST PE W OR WO CONTRAST  Result Date: 2019-07-07 CLINICAL DATA:  Respiratory failure EXAM: CT ANGIOGRAPHY CHEST WITH CONTRAST TECHNIQUE: Multidetector CT imaging of the chest was performed using the standard protocol during bolus administration of intravenous contrast. Multiplanar CT image reconstructions and MIPs were obtained to evaluate the vascular anatomy. CONTRAST:  163mL OMNIPAQUE IOHEXOL 350 MG/ML SOLN COMPARISON:  02/22/2019.  Earlier chest x-ray. FINDINGS: Cardiovascular: No filling defects in the pulmonary arteries to suggest pulmonary emboli. Heart is mildly enlarged. Aortic and coronary artery calcifications. No evidence of aortic  aneurysm. Pacer in place with leads in the right heart. Mediastinum/Nodes: No  mediastinal, hilar, or axillary adenopathy. Trachea and esophagus are unremarkable. Thyroid unremarkable. Lungs/Pleura: Extensive ground-glass airspace disease in the mid and lower lungs. Some peripheral predilection. Mild centrilobular emphysema. No effusions. Upper Abdomen: Imaging into the upper abdomen shows no acute findings. Musculoskeletal: Chest wall soft tissues are unremarkable. No acute bony abnormality. Review of the MIP images confirms the above findings. IMPRESSION: New extensive ground-glass airspace opacities within the mid and lower lungs, predominantly peripherally. Appearance is concerning for multifocal pneumonia. Cardiomegaly. No evidence of pulmonary embolus. Aortic Atherosclerosis (ICD10-I70.0) and Emphysema (ICD10-J43.9). Electronically Signed   By: Rolm Baptise M.D.   On: 07/03/19 01:19   DG Chest Port 1 View  Result Date: 06/20/2019 CLINICAL DATA:  Short of breath. EXAM: PORTABLE CHEST 1 VIEW COMPARISON:  05/22/2019 and earlier exams. FINDINGS: Cardiac silhouette is mildly enlarged. Stable left anterior chest wall biventricular cardioverter-defibrillator. No mediastinal or hilar masses. Mild hazy opacity noted in the lower lungs. There are also prominent bronchovascular markings in the lower lungs. Remainder of the lungs is clear. No convincing pleural effusion and no pneumothorax. Skeletal structures are demineralized but grossly intact. Findings are stable compared to the prior study. IMPRESSION: 1. No acute cardiopulmonary disease. Chronic findings stable from the prior exam. Electronically Signed   By: Lajean Manes M.D.   On: 06/09/2019 20:07    Microbiology Recent Results (from the past 240 hour(s))  Blood Culture (routine x 2)     Status: None   Collection Time: 06/16/2019  7:50 PM   Specimen: BLOOD  Result Value Ref Range Status   Specimen Description BLOOD RIGHT ARM  Final   Special Requests    Final    BOTTLES DRAWN AEROBIC AND ANAEROBIC Blood Culture adequate volume   Culture   Final    NO GROWTH 5 DAYS Performed at [facility], [address] [place], [place] [address]    Report Status 07/04/2019 FINAL  Final  Respiratory Panel by RT PCR (Flu A&B, Covid) - Nasopharyngeal Swab     Status: Abnormal   Collection Time: 06/28/2019  8:23 PM   Specimen: Nasopharyngeal Swab  Result Value Ref Range Status   SARS Coronavirus 2 by RT PCR POSITIVE (A) NEGATIVE Final    Comment: RESULT CALLED TO, READ BACK BY AND VERIFIED WITH: F PEIDERT RN 06/13/2019 2358 JDW (NOTE) SARS-CoV-2 target nucleic acids are DETECTED. SARS-CoV-2 RNA is generally detectable in upper respiratory specimens  during the acute phase of infection. Positive results are indicative of the presence of the identified virus, but do not rule out bacterial infection or co-infection with other pathogens not detected by the test. Clinical correlation with patient history and other diagnostic information is necessary to determine patient infection status. The expected result is Negative. Fact Sheet for Patients:  PinkCheek.be Fact Sheet for Healthcare Providers: GravelBags.it This test is not yet approved or cleared by the [place] FDA and  has been authorized for detection and/or diagnosis of SARS-CoV-2 by FDA under an Emergency Use Authorization (EUA).  This EUA will remain in effect (meaning this test can be used) for the  duration of  the COVID-19 declaration under Section 564(b)(1) of the Act, 21 U.S.C. section 360bbb-3(b)(1), unless the authorization is terminated or revoked sooner.    Influenza A by PCR NEGATIVE NEGATIVE Final   Influenza B by PCR NEGATIVE NEGATIVE Final    Comment: (NOTE) The Xpert Xpress SARS-CoV-2/FLU/RSV assay is intended as an aid in  the diagnosis of influenza from Nasopharyngeal swab specimens  and  should not be used as  a sole basis for treatment. Nasal washings and  aspirates are unacceptable for Xpert Xpress SARS-CoV-2/FLU/RSV  testing. Fact Sheet for Patients: PinkCheek.be Fact Sheet for Healthcare Providers: GravelBags.it This test is not yet approved or cleared by the [place] FDA and  has been authorized for detection and/or diagnosis of SARS-CoV-2 by  FDA under an Emergency Use Authorization (EUA). This EUA will remain  in effect (meaning this test can be used) for the duration of the  Covid-19 declaration under Section 564(b)(1) of the Act, 21  U.S.C. section 360bbb-3(b)(1), unless the authorization is  terminated or revoked. Performed at [facility], [place] [address] [place], [place] [address]   Urine culture     Status: Abnormal   Collection Time: 2019/07/23  2:45 AM   Specimen: In/Out Cath Urine  Result Value Ref Range Status   Specimen Description IN/OUT CATH URINE  Final   Special Requests   Final    NONE Performed at [facility], [place] [address] [place], [place] [address]    Culture MULTIPLE SPECIES PRESENT, SUGGEST RECOLLECTION (A)  Final   Report Status 06/19/2019 FINAL  Final  Blood Culture (routine x 2)     Status: None   Collection Time: 2019/07/23  3:12 AM   Specimen: BLOOD LEFT FOREARM  Result Value Ref Range Status   Specimen Description BLOOD LEFT FOREARM  Final   Special Requests   Final    BOTTLES DRAWN AEROBIC ONLY Blood Culture results may not be optimal due to an inadequate volume of blood received in culture bottles   Culture   Final    NO GROWTH 5 DAYS Performed at [facility], [place] [address], [place],  [address]    Report Status 07/05/2019 FINAL  Final    Lab Basic Metabolic Panel: Recent Labs  Lab 06/16/2019 1935 07/04/2019 2006 06/16/2019 2011 2019-07-23 0410  NA 134* 136 138 140  K 3.7 5.6* 3.4* 3.9  CL 99 105  --  105  CO2 16*  --   --  21*  GLUCOSE 161* 158*  --  166*   BUN 27* 42*  --  21  CREATININE 1.15* 0.90  --  1.00  CALCIUM 8.5*  --   --  8.4*  MG  --   --   --  1.3*   Liver Function Tests: Recent Labs  Lab 06/15/2019 1935 07/23/2019 0410  AST 43* 39  ALT 19 19  ALKPHOS 46 48  BILITOT 0.7 0.8  PROT 6.9 6.4*  ALBUMIN 2.8* 2.6*   No results for input(s): LIPASE, AMYLASE in the last 168 hours. No results for input(s): AMMONIA in the last 168 hours. CBC: Recent Labs  Lab 07/03/2019 1935 06/28/2019 2006 06/20/2019 2011 Jul 23, 2019 0410  WBC 10.2  --   --  18.3*  NEUTROABS 8.2*  --   --  14.8*  HGB 12.9 13.6 12.2 13.2  HCT 40.5 40.0 36.0 42.2  MCV 89.6  --   --  91.5  PLT 328  --   --  344   Cardiac Enzymes: No results for input(s): CKTOTAL, CKMB, CKMBINDEX, TROPONINI in the last 168 hours. Sepsis Labs: Recent Labs  Lab 07/02/2019 1935 06/24/2019 1944 06/18/2019 2144 06/28/2019 2200 2019/07/23 0410  PROCALCITON  --   --   --  0.10  --   WBC 10.2  --   --   --  18.3*  LATICACIDVEN  --  2.1* 2.3*  --   --     Procedures/Operations     [employer] 07/06/2019, 2:23 PM

## 2019-07-08 NOTE — ED Notes (Signed)
Patient's condition continue to worsened , admitting MD notified , RT at bedside , no new order received , RT advised to suction prn and continue NRB mask , family called by Network engineer .

## 2019-07-08 NOTE — Telephone Encounter (Signed)
   Family member( granddaughter) calling to inform Dr Jenny Reichmann patient is currently inpatient at [facility], will be moved to hospice

## 2019-07-08 NOTE — Telephone Encounter (Signed)
CALLED PATIENT TO INFORM OF STAT LABS ON 07-06-19 @ 12:45 PM @ [place], LVM FOR A RETURN CALL

## 2019-07-08 NOTE — ED Notes (Signed)
Report given to [employer]

## 2019-07-08 DEATH — deceased

## 2019-07-10 ENCOUNTER — Telehealth: Payer: Self-pay | Admitting: Radiation Oncology

## 2019-07-11 ENCOUNTER — Ambulatory Visit: Payer: Medicare Other | Admitting: Internal Medicine

## 2019-07-12 ENCOUNTER — Other Ambulatory Visit: Payer: Medicare HMO

## 2019-07-12 ENCOUNTER — Other Ambulatory Visit (HOSPITAL_COMMUNITY): Payer: Medicare HMO

## 2019-07-17 ENCOUNTER — Ambulatory Visit: Payer: Self-pay | Admitting: Internal Medicine

## 2019-07-20 NOTE — Progress Notes (Signed)
  Radiation Oncology         [phone] ________________________________  Name: Carolyn [name] MRN: [id number]  Date: 05/24/2019  DOB: 1940-03-11  End of Treatment Note  Diagnosis:   Brain metastasis     Indication for treatment:  palliative       Radiation treatment dates:   05/24/19  Site/dose:    PTV1 was to a dose of 20 Gray in 1 fraction.  This was accomplished using 3 dynamic conformal arcs and exact track formation patient positioned for each treatment angle.  Narrative: The patient tolerated radiation treatment well.   There were no signs of acute toxicity after treatment.  Plan: The patient has completed radiation treatment. The patient will return to radiation oncology clinic for routine followup in one month. I advised the patient to call or return sooner if they have any questions or concerns related to their recovery or treatment. ________________________________  ------------------------------------------------  Jodelle Gross, MD, PhD

## 2019-07-26 ENCOUNTER — Other Ambulatory Visit: Payer: Self-pay | Admitting: Internal Medicine

## 2019-08-08 ENCOUNTER — Ambulatory Visit: Payer: Medicare HMO | Admitting: Internal Medicine

## 2019-08-15 ENCOUNTER — Ambulatory Visit: Payer: Medicare Other | Admitting: Internal Medicine

## 2019-08-28 IMAGING — DX PORTABLE CHEST - 1 VIEW
1 series · 1 of 1 positions shown · non-contrast
Comparison: 10/25/2018

CLINICAL DATA: Shortness of breath

EXAM:
PORTABLE CHEST 1 VIEW

[chest ap]
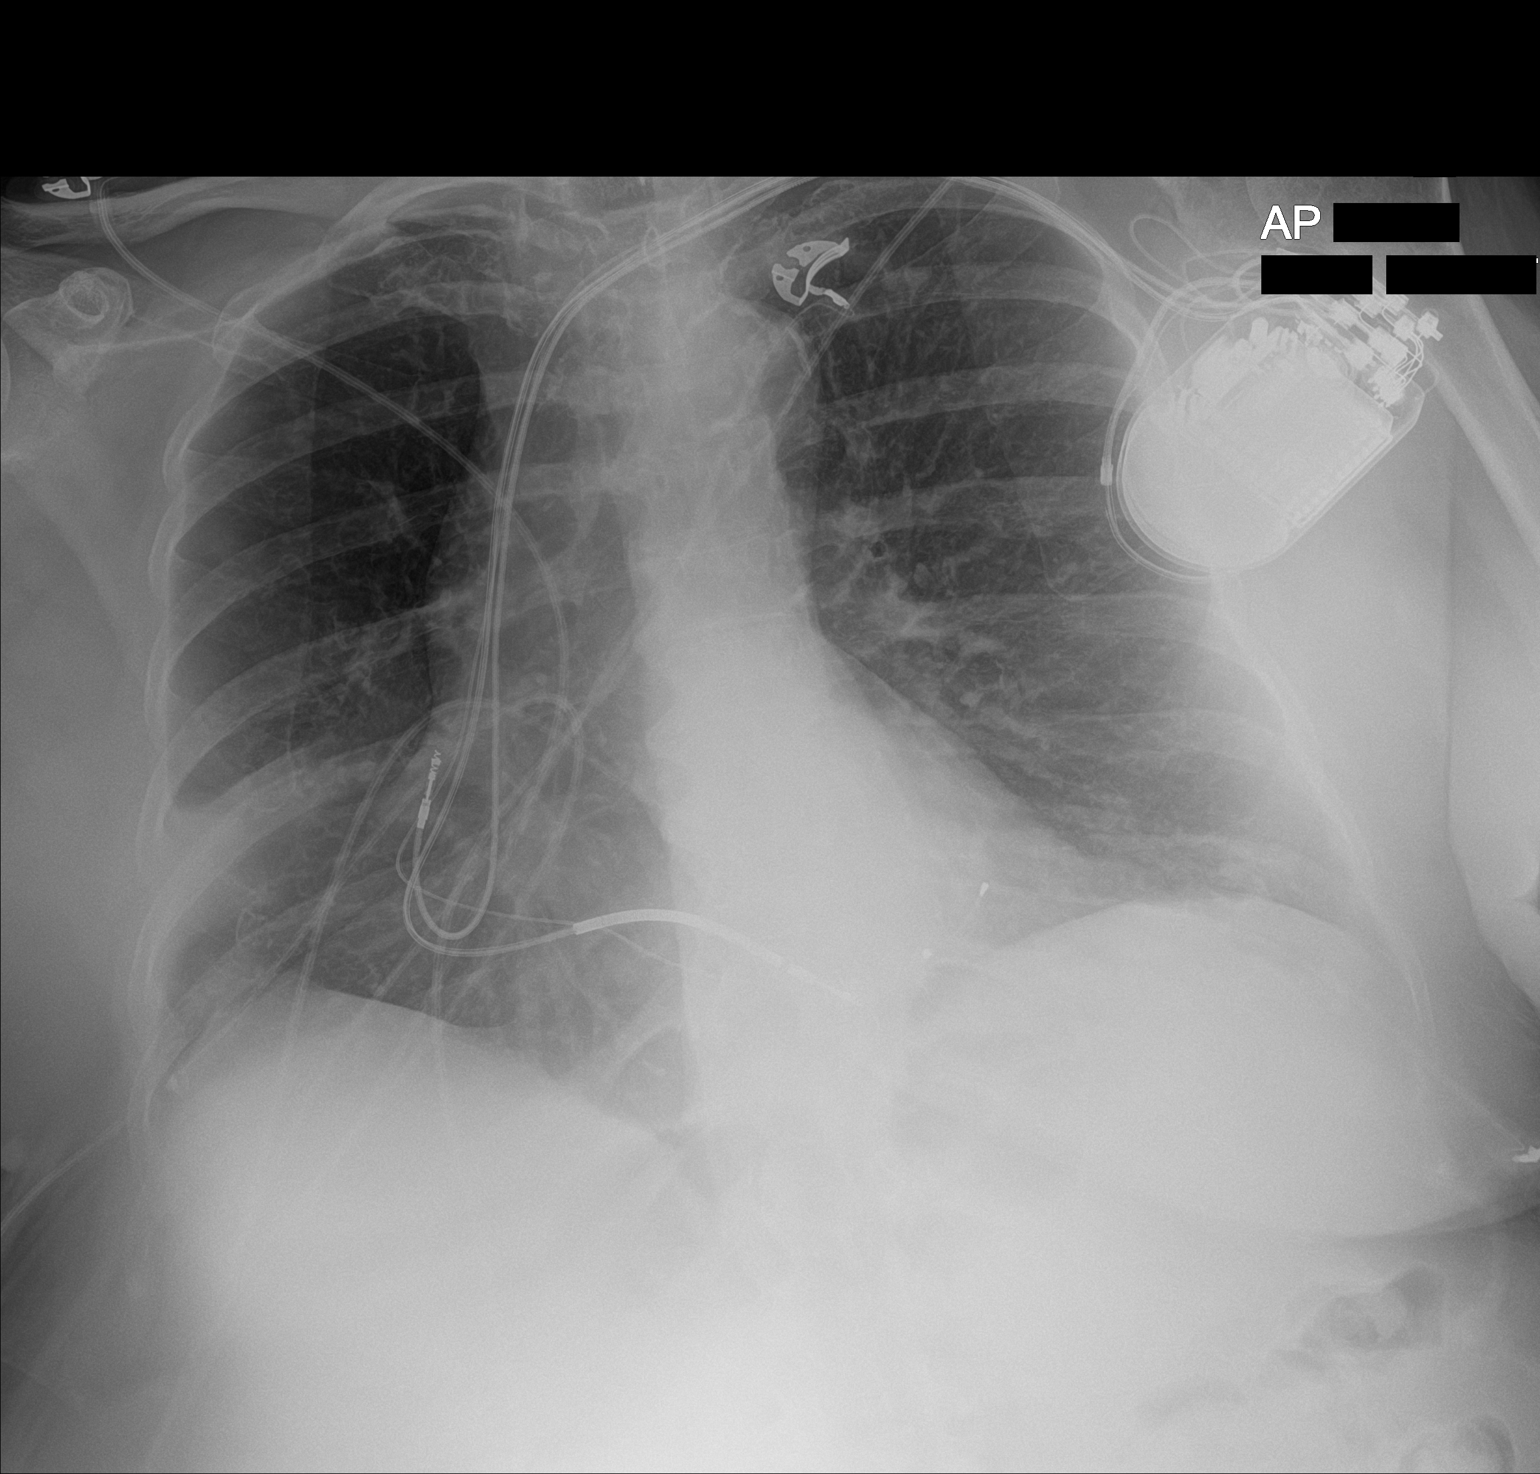

[1 of 1 positions shown; findings below may reference images not displayed]

FINDINGS: Left AICD remains in place, unchanged. Heart is mildly enlarged.
Increasing opacity at the right lung base laterally. Left lung
clear. No effusion or acute bony abnormality.
IMPRESSION: Increasing peripheral opacity at the right lung base could reflect
atelectasis or infiltrate.

## 2019-09-06 ENCOUNTER — Ambulatory Visit: Payer: Medicare Other | Admitting: Podiatry

## 2019-09-12 ENCOUNTER — Encounter: Payer: Medicare Other | Admitting: Physician Assistant

## 2019-12-11 ENCOUNTER — Encounter: Payer: Medicare Other | Admitting: Internal Medicine

## 2020-02-17 IMAGING — DX DG CHEST 2V
2 series · 2 of 2 positions shown · non-contrast
Comparison: CTA chest dated 03/04/2019. Chest radiograph dated
02/28/2019.

CLINICAL DATA: Lung cancer

EXAM:
CHEST - 2 VIEW

[chest pa]
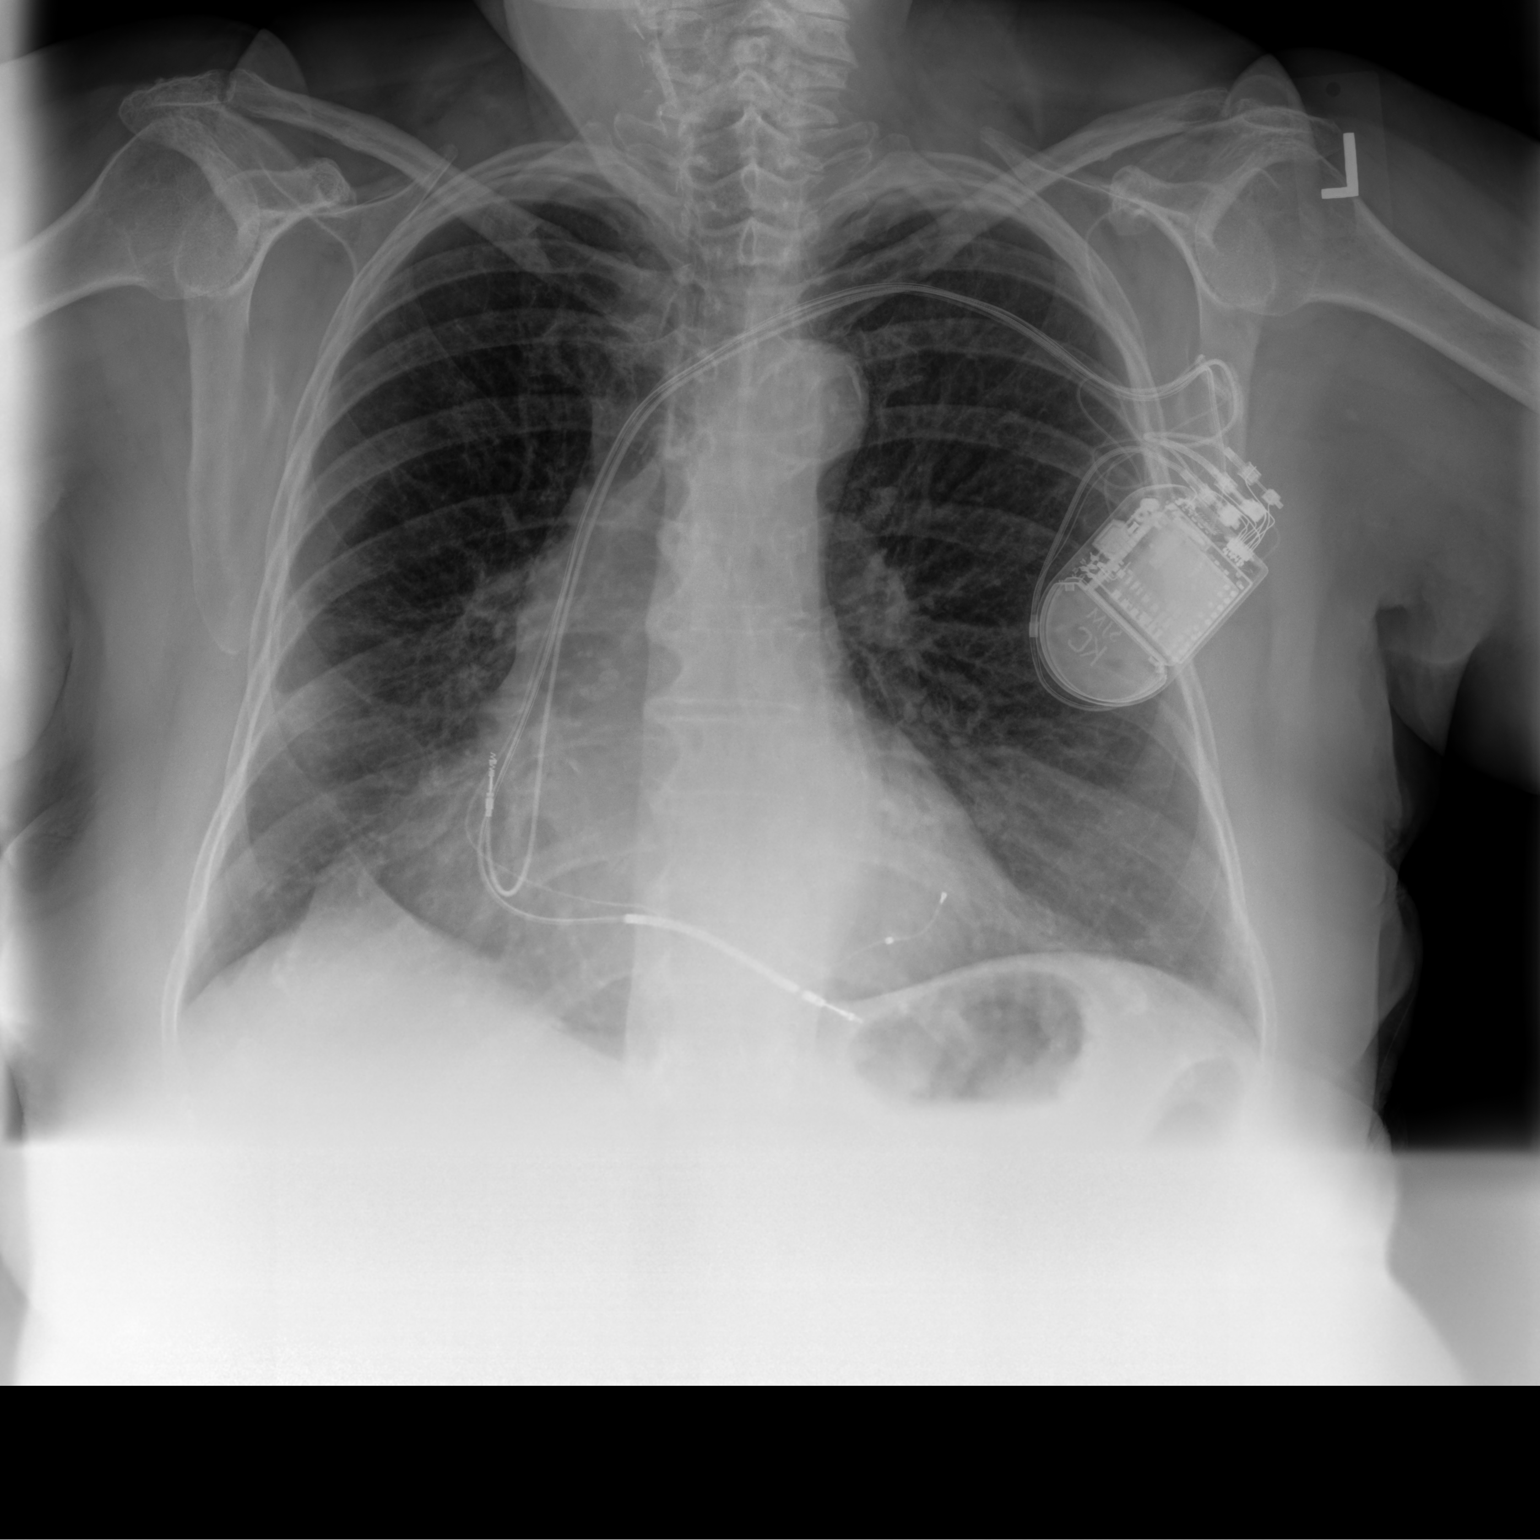

[chest lat]
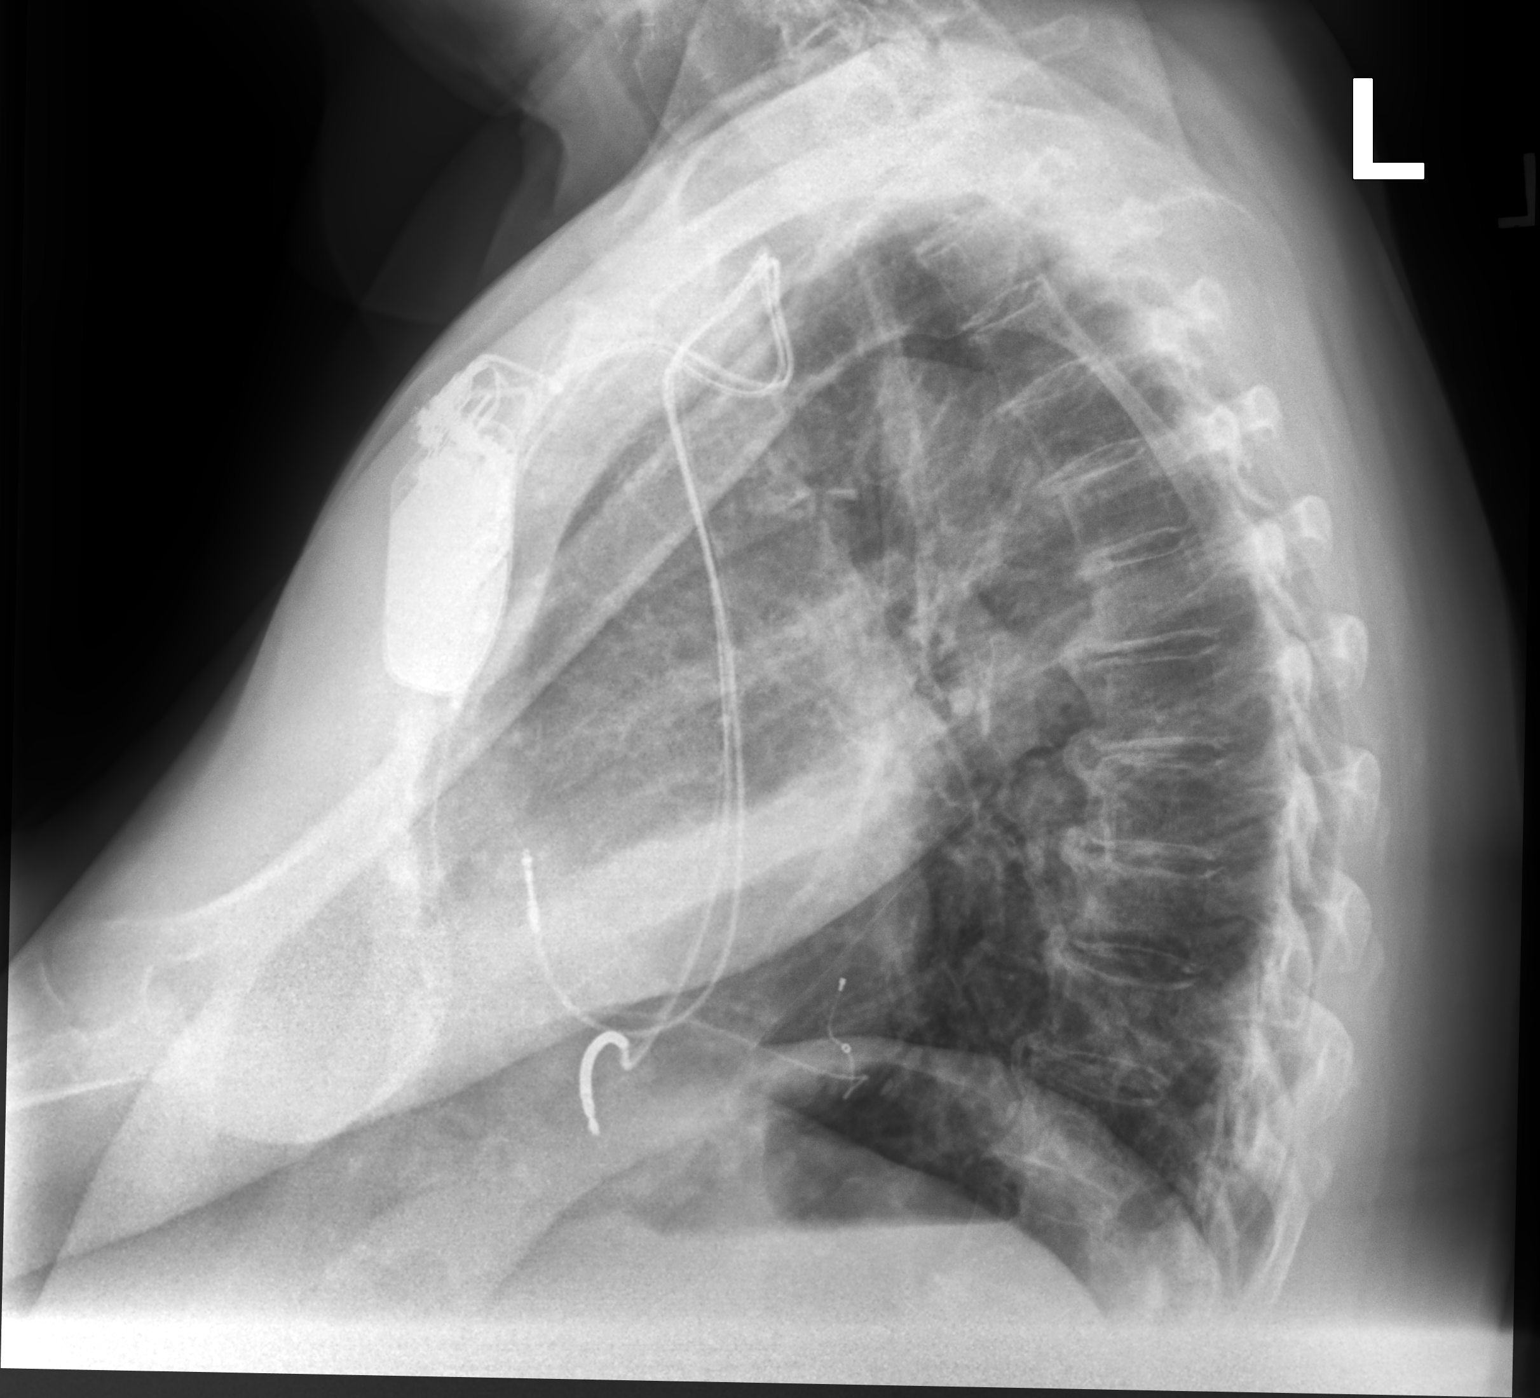

[2 of 2 positions shown; findings below may reference images not displayed]

FINDINGS: Partial right middle lobe collapse with volume loss, best visualized
on the lateral view. Underlying central right middle lobe/perihilar
mass is better evaluated on CT. Left lung is clear.

Mild cardiomegaly. Left subclavian ICD. Thoracic aortic
atherosclerosis.

Degenerative changes of the visualized thoracolumbar spine.
IMPRESSION: Partial right middle lobe collapse.

Underlying central right middle lobe/perihilar mass is better
evaluated on CT.

## 2020-04-30 IMAGING — DX DG CHEST 1V PORT
1 series · 1 of 1 positions shown · non-contrast
Comparison: 05/22/2019 and earlier exams.

CLINICAL DATA: Short of breath.

EXAM:
PORTABLE CHEST 1 VIEW

[chest]
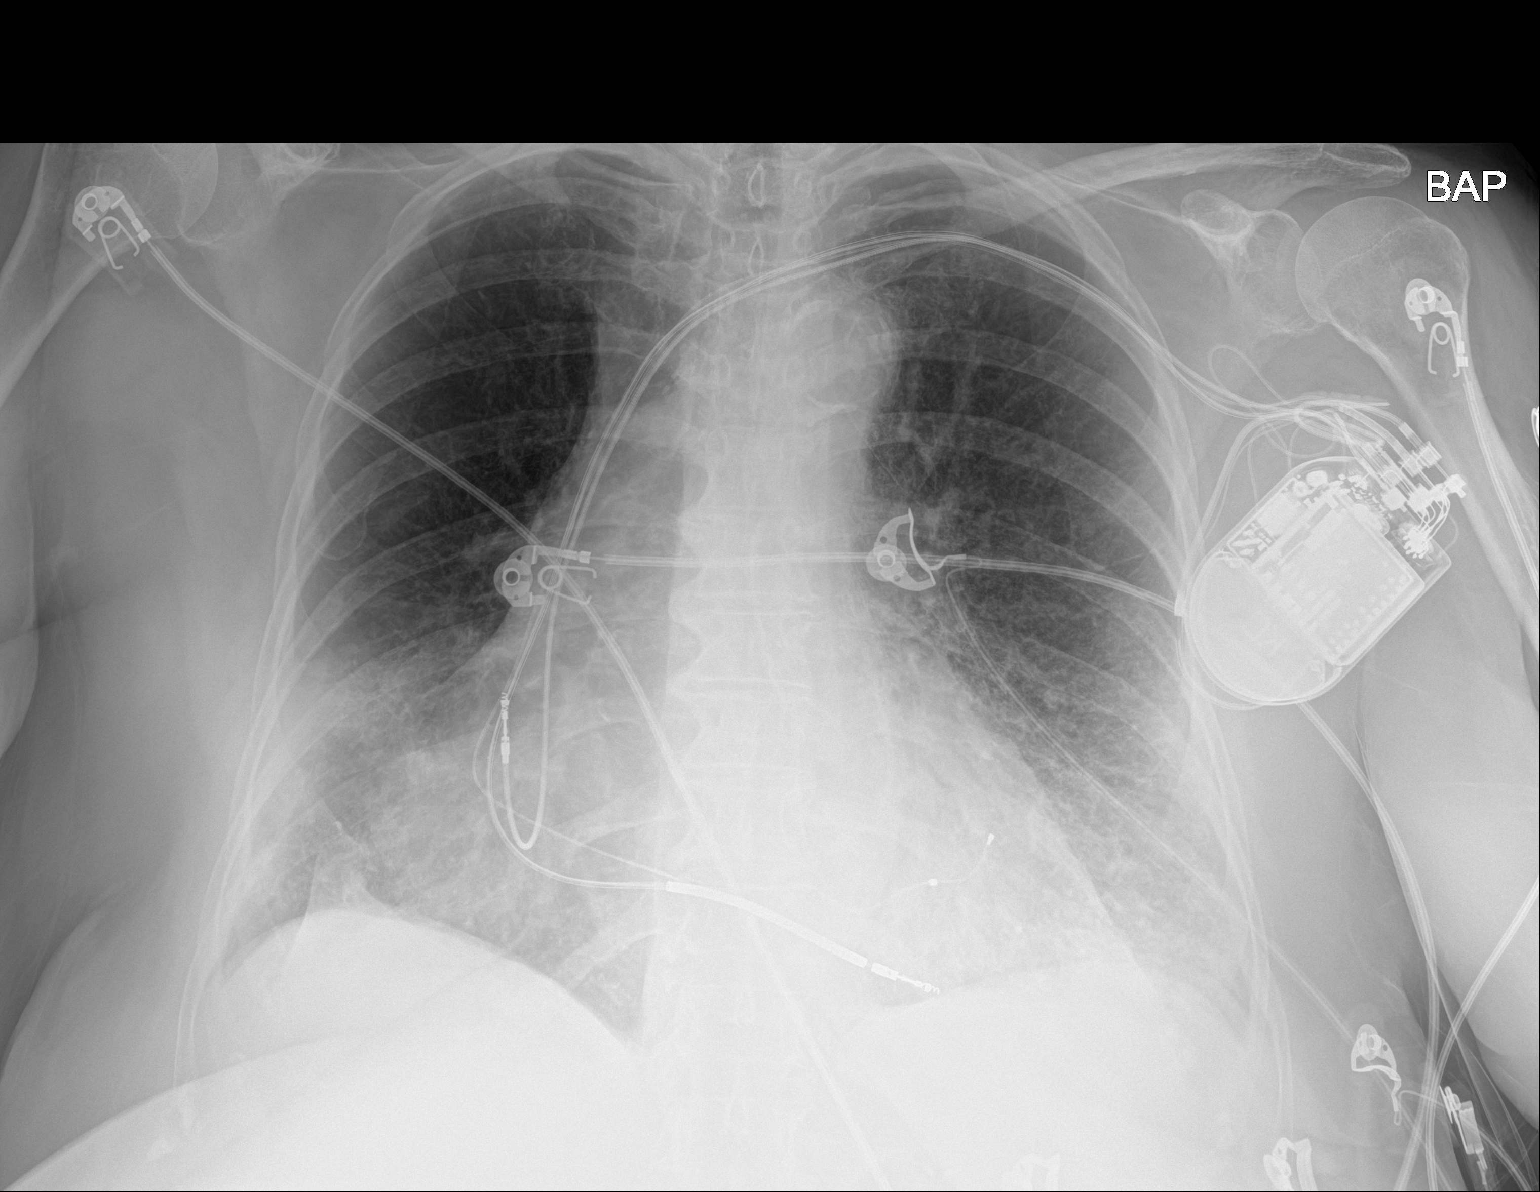

[1 of 1 positions shown; findings below may reference images not displayed]

FINDINGS: Cardiac silhouette is mildly enlarged. Stable left anterior chest
wall biventricular cardioverter-defibrillator. No mediastinal or
hilar masses.

Mild hazy opacity noted in the lower lungs. There are also prominent
bronchovascular markings in the lower lungs. Remainder of the lungs
is clear.

No convincing pleural effusion and no pneumothorax.

Skeletal structures are demineralized but grossly intact.

Findings are stable compared to the prior study.
IMPRESSION: 1. No acute cardiopulmonary disease. Chronic findings stable from
the prior exam.
